# Patient Record
Sex: Male | Born: 1955 | Race: White | Hispanic: Yes | Marital: Married | Smoking: Former smoker
Health system: Southern US, Community
[De-identification: ages and names within clinical notes are randomized; demographics above are authoritative.]

## PROBLEM LIST (undated history)

## (undated) ENCOUNTER — Emergency Department (HOSPITAL_COMMUNITY): Source: Home / Self Care

## (undated) ENCOUNTER — Inpatient Hospital Stay: Payer: Self-pay | Admitting: Internal Medicine

## (undated) DIAGNOSIS — M797 Fibromyalgia: Secondary | ICD-10-CM

## (undated) DIAGNOSIS — I219 Acute myocardial infarction, unspecified: Secondary | ICD-10-CM

## (undated) DIAGNOSIS — E039 Hypothyroidism, unspecified: Secondary | ICD-10-CM

## (undated) DIAGNOSIS — K859 Acute pancreatitis without necrosis or infection, unspecified: Secondary | ICD-10-CM

## (undated) DIAGNOSIS — I10 Essential (primary) hypertension: Secondary | ICD-10-CM

## (undated) DIAGNOSIS — K219 Gastro-esophageal reflux disease without esophagitis: Secondary | ICD-10-CM

## (undated) DIAGNOSIS — M549 Dorsalgia, unspecified: Secondary | ICD-10-CM

## (undated) DIAGNOSIS — E7800 Pure hypercholesterolemia, unspecified: Secondary | ICD-10-CM

## (undated) DIAGNOSIS — J189 Pneumonia, unspecified organism: Secondary | ICD-10-CM

## (undated) DIAGNOSIS — I639 Cerebral infarction, unspecified: Secondary | ICD-10-CM

## (undated) DIAGNOSIS — F1020 Alcohol dependence, uncomplicated: Secondary | ICD-10-CM

## (undated) HISTORY — DX: Alcohol dependence, uncomplicated: F10.20

## (undated) HISTORY — PX: BACK SURGERY: SHX140

## (undated) HISTORY — PX: FINGER SURGERY: SHX640

## (undated) HISTORY — DX: Gastro-esophageal reflux disease without esophagitis: K21.9

## (undated) HISTORY — DX: Acute pancreatitis without necrosis or infection, unspecified: K85.90

## (undated) HISTORY — DX: Pneumonia, unspecified organism: J18.9

## (undated) SURGERY — ESOPHAGOGASTRODUODENOSCOPY (EGD) WITH PROPOFOL
Anesthesia: Monitor Anesthesia Care

---

## 2007-03-14 HISTORY — PX: CHOLECYSTECTOMY: SHX55

## 2008-11-03 ENCOUNTER — Emergency Department (HOSPITAL_COMMUNITY): Admission: EM | Admit: 2008-11-03 | Discharge: 2008-11-03 | Payer: Self-pay | Admitting: Emergency Medicine

## 2008-12-05 ENCOUNTER — Emergency Department (HOSPITAL_COMMUNITY): Admission: EM | Admit: 2008-12-05 | Discharge: 2008-12-05 | Payer: Self-pay | Admitting: Emergency Medicine

## 2010-06-17 LAB — COMPREHENSIVE METABOLIC PANEL
Albumin: 5.4 g/dL — ABNORMAL HIGH (ref 3.5–5.2)
Alkaline Phosphatase: 100 U/L (ref 39–117)
BUN: 10 mg/dL (ref 6–23)
CO2: 30 mEq/L (ref 19–32)
Chloride: 103 mEq/L (ref 96–112)
Creatinine, Ser: 1.24 mg/dL (ref 0.4–1.5)
GFR calc non Af Amer: >60 mL/min (ref >60)
Potassium: 3.5 mEq/L (ref 3.5–5.1)
Total Bilirubin: 0.7 mg/dL (ref 0.3–1.2)

## 2010-06-17 LAB — LIPASE, BLOOD: Lipase: 17 U/L (ref 11–59)

## 2010-06-17 LAB — DIFFERENTIAL
Basophils Absolute: 0 10*3/uL (ref 0.0–0.1)
Basophils Relative: 0 % (ref 0–1)
Eosinophils Relative: 0 % (ref 0–5)
Lymphocytes Relative: 9 % — ABNORMAL LOW (ref 12–46)
Monocytes Absolute: 0.8 10*3/uL (ref 0.1–1.0)
Neutro Abs: 9 10*3/uL — ABNORMAL HIGH (ref 1.7–7.7)

## 2010-06-17 LAB — CBC
HCT: 50.5 % (ref 39.0–52.0)
Hemoglobin: 17.1 g/dL — ABNORMAL HIGH (ref 13.0–17.0)
MCV: 88.5 fL (ref 78.0–100.0)
Platelets: 222 10*3/uL (ref 150–400)
RBC: 5.7 MIL/uL (ref 4.22–5.81)
WBC: 10.7 10*3/uL — ABNORMAL HIGH (ref 4.0–10.5)

## 2010-09-18 ENCOUNTER — Encounter: Payer: Self-pay | Admitting: *Deleted

## 2010-09-18 ENCOUNTER — Emergency Department (HOSPITAL_COMMUNITY)
Admission: EM | Admit: 2010-09-18 | Discharge: 2010-09-18 | Disposition: A | Discharge status: Home / Self Care | Payer: Worker's Compensation | Attending: Emergency Medicine | Admitting: Emergency Medicine

## 2010-09-18 DIAGNOSIS — I10 Essential (primary) hypertension: Secondary | ICD-10-CM | POA: Insufficient documentation

## 2010-09-18 DIAGNOSIS — M545 Low back pain, unspecified: Secondary | ICD-10-CM | POA: Insufficient documentation

## 2010-09-18 DIAGNOSIS — M549 Dorsalgia, unspecified: Secondary | ICD-10-CM | POA: Insufficient documentation

## 2010-09-18 DIAGNOSIS — I252 Old myocardial infarction: Secondary | ICD-10-CM | POA: Insufficient documentation

## 2010-09-18 DIAGNOSIS — I219 Acute myocardial infarction, unspecified: Secondary | ICD-10-CM | POA: Insufficient documentation

## 2010-09-18 DIAGNOSIS — Z8673 Personal history of transient ischemic attack (TIA), and cerebral infarction without residual deficits: Secondary | ICD-10-CM | POA: Insufficient documentation

## 2010-09-18 DIAGNOSIS — IMO0001 Reserved for inherently not codable concepts without codable children: Secondary | ICD-10-CM | POA: Insufficient documentation

## 2010-09-18 DIAGNOSIS — J45909 Unspecified asthma, uncomplicated: Secondary | ICD-10-CM | POA: Insufficient documentation

## 2010-09-18 DIAGNOSIS — I639 Cerebral infarction, unspecified: Secondary | ICD-10-CM | POA: Insufficient documentation

## 2010-09-18 DIAGNOSIS — M797 Fibromyalgia: Secondary | ICD-10-CM | POA: Insufficient documentation

## 2010-09-18 HISTORY — DX: Fibromyalgia: M79.7

## 2010-09-18 HISTORY — DX: Acute myocardial infarction, unspecified: I21.9

## 2010-09-18 HISTORY — DX: Dorsalgia, unspecified: M54.9

## 2010-09-18 HISTORY — DX: Cerebral infarction, unspecified: I63.9

## 2010-09-18 HISTORY — DX: Pure hypercholesterolemia, unspecified: E78.00

## 2010-09-18 HISTORY — DX: Essential (primary) hypertension: I10

## 2010-09-18 MED ORDER — MORPHINE SULFATE 4 MG/ML IJ SOLN
4.0000 mg | Freq: Once | INTRAMUSCULAR | Status: AC
  Administered 2010-09-18: 4 mg via INTRAMUSCULAR
  Filled 2010-09-18: qty 1

## 2010-09-18 MED ORDER — HYDROCODONE-ACETAMINOPHEN 5-325 MG PO TABS: 2.0000 | ORAL_TABLET | ORAL | Status: AC | PRN

## 2010-09-18 MED ORDER — CYCLOBENZAPRINE HCL 10 MG PO TABS: 10.0000 mg | ORAL_TABLET | Freq: Two times a day (BID) | ORAL | Status: AC | PRN

## 2010-09-18 NOTE — ED Provider Notes (Addendum)
History     Chief Complaint  Patient presents with  . Back Pain    lower back pain, unable to walk   Patient is a 55 y.o. male presenting with back pain. The history is provided by the patient and the spouse (HX OF CHRONIC BACK PAIN WITH SEVERAL SURGERIES IN 09. FOLLOWED DR RAMOS AND DR Shelle Iron AND RECENTL REFERRAL TO PAIN MANAGEMENT. EXACERBATION OF CHORNIC BACK PAIN YESTERDAY. ).  Back Pain  This is a chronic problem. The problem occurs constantly. The problem has been rapidly worsening. The pain is associated with no known injury. The pain is present in the lumbar spine. The quality of the pain is described as stabbing and aching. The pain radiates to the right foot. The pain is at a severity of 10/10. The pain is severe. The symptoms are aggravated by bending and certain positions. The pain is the same all the time. Associated symptoms include numbness, leg pain and paresthesias. Pertinent negatives include no chest pain, no fever, no abdominal pain and no bladder incontinence. Treatments tried: HELPED SOME BY VALIUM. HE TOOK A=OLD VALIUM TAB. The treatment provided mild relief.    Past Medical History  Diagnosis Date  . Back pain   . Hypertension   . Asthma   . Thyroid disease   . High cholesterol   . Stroke   . Fibromyalgia   . Myocardial infarct     Past Surgical History  Procedure Date  . Back surgery   . Cholecystectomy     History reviewed. No pertinent family history.  History  Substance Use Topics  . Smoking status: Passive Smoker  . Smokeless tobacco: Not on file  . Alcohol Use: Yes     occasional      Review of Systems  Constitutional: Negative for fever.  HENT: Negative for neck pain.   Eyes: Negative for pain and redness.  Respiratory: Negative for cough and shortness of breath.   Cardiovascular: Negative for chest pain and palpitations.  Gastrointestinal: Negative for vomiting and abdominal pain.  Genitourinary: Negative for bladder incontinence and  difficulty urinating.  Musculoskeletal: Positive for back pain.  Skin: Negative for rash.  Neurological: Positive for numbness and paresthesias.  Hematological: Negative for adenopathy.  Psychiatric/Behavioral: Negative for confusion.    Physical Exam  BP 97/48  Pulse 72  Temp(Src) 98.3 F (36.8 C) (Oral)  Resp 18  Ht 5\' 7"  (1.702 m)  Wt 165 lb (74.844 kg)  BMI 25.84 kg/m2  SpO2 99%  Physical Exam  Constitutional: He is oriented to person, place, and time. He appears well-developed and well-nourished.  HENT:  Head: Normocephalic and atraumatic.  Eyes: EOM are normal. Pupils are equal, round, and reactive to light.  Neck: Normal range of motion.  Cardiovascular: Normal rate, regular rhythm and normal heart sounds.   Pulmonary/Chest: Breath sounds normal.  Abdominal: Soft. Bowel sounds are normal. There is no tenderness.  Musculoskeletal: He exhibits tenderness.       TENDER BILAT LOW BACK NO SPASM. GOOD DISTAL PULSES. NUMBNESS RIGHT FOOT.   Neurological: He is alert and oriented to person, place, and time. No cranial nerve deficit.  Skin: Skin is warm and dry. No rash noted.  Psychiatric: He has a normal mood and affect.    ED Course  Procedures  MDM IMPROVED WITH MORPHINE IV IN ED. FEELING BETTER.       Shelda Jakes, MD 09/18/10 1513  Shelda Jakes, MD 09/18/10 1515

## 2010-09-18 NOTE — ED Notes (Signed)
Lower back pain radiating down both legs x 2 days.  States is unable to walk.  Denies injury.

## 2010-09-18 NOTE — ED Notes (Signed)
Family with pt. Pt resting with eyes closed now

## 2010-09-22 ENCOUNTER — Encounter (HOSPITAL_COMMUNITY): Payer: Self-pay

## 2012-03-13 HISTORY — PX: PANCREATIC PSEUDOCYST DRAINAGE: SHX2158

## 2012-05-30 ENCOUNTER — Inpatient Hospital Stay (HOSPITAL_COMMUNITY)
Admission: EM | Admit: 2012-05-30 | Discharge: 2012-06-14 | DRG: 440 | Disposition: A | Discharge status: Skilled Nursing Facility | Payer: Medicare Other | Attending: Internal Medicine | Admitting: Internal Medicine

## 2012-05-30 ENCOUNTER — Encounter (HOSPITAL_COMMUNITY): Payer: Self-pay | Admitting: *Deleted

## 2012-05-30 ENCOUNTER — Emergency Department (HOSPITAL_COMMUNITY): Payer: Medicare Other

## 2012-05-30 DIAGNOSIS — F329 Major depressive disorder, single episode, unspecified: Secondary | ICD-10-CM | POA: Diagnosis present

## 2012-05-30 DIAGNOSIS — K8689 Other specified diseases of pancreas: Secondary | ICD-10-CM | POA: Diagnosis present

## 2012-05-30 DIAGNOSIS — I10 Essential (primary) hypertension: Secondary | ICD-10-CM | POA: Diagnosis present

## 2012-05-30 DIAGNOSIS — Z8673 Personal history of transient ischemic attack (TIA), and cerebral infarction without residual deficits: Secondary | ICD-10-CM

## 2012-05-30 DIAGNOSIS — E039 Hypothyroidism, unspecified: Secondary | ICD-10-CM | POA: Diagnosis present

## 2012-05-30 DIAGNOSIS — K859 Acute pancreatitis without necrosis or infection, unspecified: Principal | ICD-10-CM | POA: Diagnosis present

## 2012-05-30 DIAGNOSIS — M549 Dorsalgia, unspecified: Secondary | ICD-10-CM

## 2012-05-30 DIAGNOSIS — R109 Unspecified abdominal pain: Secondary | ICD-10-CM

## 2012-05-30 DIAGNOSIS — N200 Calculus of kidney: Secondary | ICD-10-CM | POA: Diagnosis present

## 2012-05-30 DIAGNOSIS — I2510 Atherosclerotic heart disease of native coronary artery without angina pectoris: Secondary | ICD-10-CM | POA: Diagnosis present

## 2012-05-30 DIAGNOSIS — K5900 Constipation, unspecified: Secondary | ICD-10-CM | POA: Diagnosis present

## 2012-05-30 DIAGNOSIS — IMO0001 Reserved for inherently not codable concepts without codable children: Secondary | ICD-10-CM | POA: Diagnosis present

## 2012-05-30 DIAGNOSIS — J45909 Unspecified asthma, uncomplicated: Secondary | ICD-10-CM | POA: Diagnosis present

## 2012-05-30 DIAGNOSIS — K831 Obstruction of bile duct: Secondary | ICD-10-CM | POA: Diagnosis present

## 2012-05-30 DIAGNOSIS — F3289 Other specified depressive episodes: Secondary | ICD-10-CM | POA: Diagnosis present

## 2012-05-30 DIAGNOSIS — K863 Pseudocyst of pancreas: Secondary | ICD-10-CM

## 2012-05-30 DIAGNOSIS — Z951 Presence of aortocoronary bypass graft: Secondary | ICD-10-CM

## 2012-05-30 DIAGNOSIS — F1210 Cannabis abuse, uncomplicated: Secondary | ICD-10-CM | POA: Diagnosis present

## 2012-05-30 DIAGNOSIS — I25810 Atherosclerosis of coronary artery bypass graft(s) without angina pectoris: Secondary | ICD-10-CM

## 2012-05-30 DIAGNOSIS — K449 Diaphragmatic hernia without obstruction or gangrene: Secondary | ICD-10-CM | POA: Diagnosis present

## 2012-05-30 DIAGNOSIS — R000 Tachycardia, unspecified: Secondary | ICD-10-CM | POA: Diagnosis present

## 2012-05-30 DIAGNOSIS — R945 Abnormal results of liver function studies: Secondary | ICD-10-CM | POA: Diagnosis present

## 2012-05-30 DIAGNOSIS — E785 Hyperlipidemia, unspecified: Secondary | ICD-10-CM | POA: Diagnosis present

## 2012-05-30 DIAGNOSIS — M797 Fibromyalgia: Secondary | ICD-10-CM

## 2012-05-30 DIAGNOSIS — E782 Mixed hyperlipidemia: Secondary | ICD-10-CM | POA: Diagnosis present

## 2012-05-30 DIAGNOSIS — I252 Old myocardial infarction: Secondary | ICD-10-CM

## 2012-05-30 DIAGNOSIS — D72829 Elevated white blood cell count, unspecified: Secondary | ICD-10-CM | POA: Diagnosis present

## 2012-05-30 DIAGNOSIS — E876 Hypokalemia: Secondary | ICD-10-CM | POA: Diagnosis present

## 2012-05-30 LAB — CBC WITH DIFFERENTIAL/PLATELET
Basophils Absolute: 0 10*3/uL (ref 0.0–0.1)
Basophils Relative: 0 % (ref 0–1)
Eosinophils Absolute: 0.1 10*3/uL (ref 0.0–0.7)
MCH: 29.5 pg (ref 26.0–34.0)
MCHC: 34.2 g/dL (ref 30.0–36.0)
Neutro Abs: 17.4 10*3/uL — ABNORMAL HIGH (ref 1.7–7.7)
Neutrophils Relative %: 81 % — ABNORMAL HIGH (ref 43–77)
Platelets: 246 10*3/uL (ref 150–400)

## 2012-05-30 LAB — PROTIME-INR
INR: 0.94 (ref 0.00–1.49)
Prothrombin Time: 12.5 seconds (ref 11.6–15.2)

## 2012-05-30 LAB — COMPREHENSIVE METABOLIC PANEL
ALT: 287 U/L — ABNORMAL HIGH (ref 0–53)
AST: 191 U/L — ABNORMAL HIGH (ref 0–37)
Albumin: 4.1 g/dL (ref 3.5–5.2)
Alkaline Phosphatase: 529 U/L — ABNORMAL HIGH (ref 39–117)
Chloride: 103 mEq/L (ref 96–112)
Potassium: 3 mEq/L — ABNORMAL LOW (ref 3.5–5.1)
Sodium: 141 mEq/L (ref 135–145)
Total Bilirubin: 1.6 mg/dL — ABNORMAL HIGH (ref 0.3–1.2)
Total Protein: 7.6 g/dL (ref 6.0–8.3)

## 2012-05-30 LAB — LIPASE, BLOOD: Lipase: >3000 U/L — ABNORMAL HIGH (ref 11–59)

## 2012-05-30 LAB — URINALYSIS, ROUTINE W REFLEX MICROSCOPIC
Bilirubin Urine: NEGATIVE
Glucose, UA: NEGATIVE mg/dL
Hgb urine dipstick: NEGATIVE
Nitrite: NEGATIVE
Specific Gravity, Urine: 1.015 (ref 1.005–1.030)
pH: 6 (ref 5.0–8.0)

## 2012-05-30 LAB — RAPID URINE DRUG SCREEN, HOSP PERFORMED
Amphetamines: NOT DETECTED
Barbiturates: NOT DETECTED
Benzodiazepines: NOT DETECTED
Cocaine: NOT DETECTED
Tetrahydrocannabinol: POSITIVE — AB

## 2012-05-30 LAB — APTT: aPTT: 23 seconds — ABNORMAL LOW (ref 24–37)

## 2012-05-30 MED ORDER — LEVOTHYROXINE SODIUM 100 MCG IV SOLR
50.0000 ug | Freq: Every day | INTRAVENOUS | Status: DC
  Administered 2012-05-31 – 2012-06-03 (×4): 50 ug via INTRAVENOUS
  Administered 2012-06-04 – 2012-06-05 (×2): via INTRAVENOUS
  Administered 2012-06-06 – 2012-06-07 (×2): 50 ug via INTRAVENOUS
  Filled 2012-05-30 (×12): qty 5

## 2012-05-30 MED ORDER — SODIUM CHLORIDE 0.9 % IV BOLUS (SEPSIS): 2000.0000 mL | Freq: Once | INTRAVENOUS | Status: DC

## 2012-05-30 MED ORDER — DIPHENHYDRAMINE HCL 50 MG/ML IJ SOLN: 12.5000 mg | Freq: Four times a day (QID) | INTRAMUSCULAR | Status: DC | PRN

## 2012-05-30 MED ORDER — SODIUM CHLORIDE 0.9 % IV BOLUS (SEPSIS)
2000.0000 mL | Freq: Once | INTRAVENOUS | Status: AC
  Administered 2012-05-30: 2000 mL via INTRAVENOUS

## 2012-05-30 MED ORDER — POTASSIUM CHLORIDE IN NACL 20-0.9 MEQ/L-% IV SOLN
INTRAVENOUS | Status: DC
  Administered 2012-05-31 (×2): via INTRAVENOUS

## 2012-05-30 MED ORDER — ONDANSETRON HCL 4 MG/2ML IJ SOLN
4.0000 mg | INTRAMUSCULAR | Status: DC | PRN
  Administered 2012-06-01 – 2012-06-11 (×2): 4 mg via INTRAVENOUS
  Filled 2012-05-30 (×2): qty 2

## 2012-05-30 MED ORDER — SODIUM CHLORIDE 0.9 % IV SOLN
INTRAVENOUS | Status: AC
  Administered 2012-05-30: 23:00:00 via INTRAVENOUS

## 2012-05-30 MED ORDER — ENOXAPARIN SODIUM 40 MG/0.4ML ~~LOC~~ SOLN
40.0000 mg | SUBCUTANEOUS | Status: DC
  Administered 2012-05-31 – 2012-06-14 (×15): 40 mg via SUBCUTANEOUS
  Filled 2012-05-30 (×19): qty 0.4

## 2012-05-30 MED ORDER — ONDANSETRON HCL 4 MG/2ML IJ SOLN
4.0000 mg | INTRAMUSCULAR | Status: DC | PRN
  Administered 2012-05-30: 4 mg via INTRAVENOUS
  Filled 2012-05-30: qty 2

## 2012-05-30 MED ORDER — FLEET ENEMA 7-19 GM/118ML RE ENEM: 1.0000 | ENEMA | Freq: Once | RECTAL | Status: AC | PRN

## 2012-05-30 MED ORDER — FENTANYL CITRATE 0.05 MG/ML IJ SOLN
100.0000 ug | INTRAMUSCULAR | Status: DC | PRN
  Administered 2012-05-30: 100 ug via INTRAVENOUS
  Filled 2012-05-30: qty 2

## 2012-05-30 MED ORDER — PROMETHAZINE HCL 25 MG/ML IJ SOLN
25.0000 mg | Freq: Once | INTRAMUSCULAR | Status: AC
  Administered 2012-05-30: 25 mg via INTRAVENOUS
  Filled 2012-05-30: qty 1

## 2012-05-30 MED ORDER — SODIUM CHLORIDE 0.9 % IJ SOLN
3.0000 mL | Freq: Two times a day (BID) | INTRAMUSCULAR | Status: DC
  Administered 2012-05-31 – 2012-06-13 (×22): 3 mL via INTRAVENOUS
  Administered 2012-06-14: 10:00:00 via INTRAVENOUS

## 2012-05-30 MED ORDER — POTASSIUM CHLORIDE 10 MEQ/100ML IV SOLN
10.0000 meq | INTRAVENOUS | Status: AC
  Administered 2012-05-30 – 2012-05-31 (×4): 10 meq via INTRAVENOUS
  Filled 2012-05-30 (×3): qty 100

## 2012-05-30 MED ORDER — PANTOPRAZOLE SODIUM 40 MG IV SOLR
80.0000 mg | Freq: Two times a day (BID) | INTRAVENOUS | Status: DC
  Administered 2012-05-31 – 2012-06-02 (×7): 80 mg via INTRAVENOUS
  Filled 2012-05-30 (×12): qty 80

## 2012-05-30 MED ORDER — MORPHINE SULFATE 10 MG/ML IJ SOLN
10.0000 mg | Freq: Once | INTRAMUSCULAR | Status: AC
  Administered 2012-05-30: 10 mg via INTRAVENOUS
  Filled 2012-05-30: qty 1

## 2012-05-30 MED ORDER — ONDANSETRON HCL 4 MG/2ML IJ SOLN
4.0000 mg | Freq: Once | INTRAMUSCULAR | Status: AC
  Administered 2012-05-30: 4 mg via INTRAVENOUS
  Filled 2012-05-30: qty 2

## 2012-05-30 MED ORDER — NALOXONE HCL 0.4 MG/ML IJ SOLN: 0.4000 mg | INTRAMUSCULAR | Status: DC | PRN

## 2012-05-30 MED ORDER — FENTANYL CITRATE 0.05 MG/ML IJ SOLN
INTRAMUSCULAR | Status: AC
  Filled 2012-05-30: qty 2

## 2012-05-30 MED ORDER — SODIUM CHLORIDE 0.9 % IV SOLN
1000.0000 mL | INTRAVENOUS | Status: DC
  Administered 2012-05-30: 1000 mL via INTRAVENOUS

## 2012-05-30 MED ORDER — SODIUM CHLORIDE 0.9 % IJ SOLN: 9.0000 mL | INTRAMUSCULAR | Status: DC | PRN

## 2012-05-30 MED ORDER — BISACODYL 10 MG RE SUPP: 10.0000 mg | Freq: Every day | RECTAL | Status: DC | PRN

## 2012-05-30 MED ORDER — FENTANYL CITRATE 0.05 MG/ML IJ SOLN
100.0000 ug | Freq: Once | INTRAMUSCULAR | Status: AC
  Administered 2012-05-30: 100 ug via INTRAVENOUS
  Filled 2012-05-30: qty 2

## 2012-05-30 MED ORDER — FENTANYL 10 MCG/ML IV SOLN
INTRAVENOUS | Status: DC
  Administered 2012-05-31: 14:00:00 via INTRAVENOUS
  Administered 2012-05-31: 465 ug/h via INTRAVENOUS
  Administered 2012-05-31: 272.9 ug/h via INTRAVENOUS
  Administered 2012-05-31: via INTRAVENOUS
  Filled 2012-05-30 (×4): qty 50

## 2012-05-30 NOTE — H&P (Signed)
[facility] History and Physical  Justin [name]  RUE:[id number]  DOB: 02-Feb-1956   DOA: 05/30/2012   PCP:   No primary provider on file.   Chief Complaint:  Nausea and abdominal pain since last night  HPI: Justin [name] is an 57 y.o. male.   Middle-aged gentleman, past history of cholecystectomy, denies a history of alcohol use, presents with severe abdominal pain and vomiting since last night. Patient's wife has a habit of bringing him a milkshake every night, he was nauseous last night and after the milkshake got much worse. Since this morning has been vomiting unable to keep anything down. No blood in the vomit no fever no diarrhea, eventually because the pain was intolerable he came to the emergency room, where blood work revealed a markedly elevated lipase and arrange liver function. The hospitalist service was called to assist.  Patient has required large amounts of narcotics for pain control, and because of a combination of ongoing severe pain and drowsiness from narcotics, and most of the history is filled out by his wife.  He has chronic back pains due to disc problems, for which she sees Dr. August Saucer and Dr. Ethelene Hal, and is reported scheduled for surgery in the near future.  She takes Cymbalta and Vicodin as a part of her chronic pain program he did to his back pains and fibromyalgia  Rewiew of Systems:   All systems negative except as marked bold or noted in the HPI;  Constitutional:    malaise, fever and chills. ;  Eyes:   eye pain, redness and discharge. ;  ENMT:   ear pain, hoarseness, nasal congestion, sinus pressure and sore throat. ;  Cardiovascular:    chest pain, palpitations, diaphoresis, dyspnea and peripheral edema.  Respiratory:   cough, hemoptysis, wheezing and stridor. ;  Gastrointestinal:  nausea, vomiting, diarrhea, constipation, abdominal pain, melena, blood in stool, hematemesis, jaundice and rectal bleeding. unusual weight loss..   Genitourinary:     frequency, dysuria, incontinence,flank pain and hematuria; Musculoskeletal:   back pain and neck pain.  swelling and trauma.;  Skin: .  pruritus, rash, abrasions, bruising and skin lesion.; ulcerations Neuro:    headache, lightheadedness and neck stiffness.  weakness, altered level of consciousness, altered mental status, extremity weakness, burning feet, involuntary movement, seizure and syncope.  Psych:    anxiety, depression, insomnia, tearfulness, panic attacks, hallucinations, paranoia, suicidal or homicidal ideation    Past Medical History  Diagnosis Date  . Back pain   . Hypertension   . Asthma   . Thyroid disease   . High cholesterol   . Stroke   . Fibromyalgia   . Myocardial infarct     Past Surgical History  Procedure Laterality Date  . Back surgery    . Cholecystectomy      Medications:  HOME MEDS: Prior to Admission medications   Medication Sig Start Date End Date Taking? Authorizing Provider  aspirin 325 MG tablet Take 325 mg by mouth daily.     Yes Historical Provider, MD  DULoxetine (CYMBALTA) 60 MG capsule Take 60 mg by mouth daily.     Yes Historical Provider, MD  esomeprazole (NEXIUM) 40 MG capsule Take 40 mg by mouth daily before breakfast.   Yes Historical Provider, MD  gabapentin (NEURONTIN) 300 MG capsule Take 300 mg by mouth 3 (three) times daily.     Yes Historical Provider, MD  HYDROcodone-acetaminophen (NORCO/VICODIN) 5-325 MG per tablet Take 1 tablet by mouth 2 (two) times daily as  needed for pain.   Yes Historical Provider, MD  levothyroxine (SYNTHROID, LEVOTHROID) 100 MCG tablet Take 100 mcg by mouth daily.     Yes Historical Provider, MD  simvastatin (ZOCOR) 80 MG tablet Take 80 mg by mouth at bedtime.     Yes Historical Provider, MD  nitroGLYCERIN (NITROSTAT) 0.4 MG SL tablet Place 0.4 mg under the tongue every 5 (five) minutes as needed for chest pain.     Historical Provider, MD     Allergies:  Allergies  Allergen Reactions  . Amoxicillin    . Dilaudid (Hydromorphone Hcl) Swelling    Social History:   reports that he has been passively smoking.  He does not have any smokeless tobacco history on file. He reports that  drinks alcohol. He reports that he does not use illicit drugs.  Family History: Family History  Problem Relation Age of Onset  . Diabetes Mother   . Diabetes Brother   . Diabetes Brother   . Cancer Father      Physical Exam: Filed Vitals:   05/30/12 1935 05/30/12 2142 05/30/12 2230 05/30/12 2321  BP: 152/88 157/90 148/79 175/92  Pulse: 88 81 99 112  Temp:    97.6 F (36.4 C)  TempSrc:    Oral  Resp:  24  22  Height:    5\' 7"  (1.702 m)  Weight:    76.6 kg (168 lb 14 oz)  SpO2: 98% 97% 98% 98%   Blood pressure 175/92, pulse 112, temperature 97.6 F (36.4 C), temperature source Oral, resp. rate 22, height 5\' 7"  (1.702 m), weight 76.6 kg (168 lb 14 oz), SpO2 98.00%.  GEN:  Ill-looking and distressed Hispanic gentleman lying bed stress and groaning in pain; cooperative with exam PSYCH:  alert and oriented x4;  drowsy but anxious; affect is appropriate. HEENT: Mucous membranes pink and anicteric; PERRLA; EOM intact; no cervical lymphadenopathy nor thyromegaly or carotid bruit; no JVD; Breasts:: Not examined CHEST WALL: No tenderness CHEST: Tachypneic, clear to auscultation bilaterally HEART: Regular rate and rhythm; no murmurs rubs or gallops BACK: No kyphosis no scoliosis; no CVA tenderness ABDOMEN: Obese, distended diffuse mild tenderness no rebound, no organomegaly, absent  abdominal bowel sounds; no pannus; no intertriginous candida. Rectal Exam: Not done EXTREMITIES: No bone or joint deformity; age-appropriate arthropathy of the hands and knees; no edema; no ulcerations. Genitalia: not examined PULSES: 2+ and symmetric SKIN: Normal hydration no rash or ulceration CNS: Cranial nerves 2-12 grossly intact no focal lateralizing neurologic deficit   Labs on Admission:  Basic Metabolic  Panel:  Recent Labs Lab 05/30/12 1855  NA 141  K 3.0*  CL 103  CO2 26  GLUCOSE 184*  BUN 11  CREATININE 1.03  CALCIUM 9.4   Liver Function Tests:  Recent Labs Lab 05/30/12 1855  AST 191*  ALT 287*  ALKPHOS 529*  BILITOT 1.6*  PROT 7.6  ALBUMIN 4.1    Recent Labs Lab 05/30/12 1855  LIPASE >3000*   No results found for this basename: AMMONIA,  in the last 168 hours CBC:  Recent Labs Lab 05/30/12 1855  WBC 21.6*  NEUTROABS 17.4*  HGB 14.8  HCT 43.3  MCV 86.3  PLT 246   Cardiac Enzymes: No results found for this basename: CKTOTAL, CKMB, CKMBINDEX, TROPONINI,  in the last 168 hours BNP: No components found with this basename: POCBNP,  D-dimer: No components found with this basename: D-DIMER,  CBG: No results found for this basename: GLUCAP,  in the last 168 hours  Radiological Exams on Admission: Dg Abd Acute W/chest  05/30/2012  *RADIOLOGY REPORT*  Clinical Data: Abdominal pain and vomiting.  ACUTE ABDOMEN SERIES (ABDOMEN 2 VIEW & CHEST 1 VIEW)  Comparison: 12/05/2008  Findings: Lungs are clear. Heart and mediastinum are within normal limits.  Trachea is midline.  No evidence of free air.  Nonspecific bowel gas pattern.  Surgical clips in the right upper abdomen. Degenerative changes in the lower lumbar spine.  Phleboliths in the pelvis.  IMPRESSION: No acute findings.   Original Report Authenticated By: Richarda Overlie, M.D.      Assessment/Plan Present on Admission:  . Acute pancreatitis . Hypokalemia . Abnormal LFTs (liver function tests) . Back pain . Fibromyalgia . High cholesterol . CAD (coronary artery disease) of artery bypass graft   PLAN: We'll admit this gentleman to telemetry for IV fluid hydration and repletion of his potassium. He is allergic to Dilaudid, and has high narcotic requirements; we will put him on fentanyl PCA pump; and check a urine drug screen He'll be completely n.p.o. Elevated liver function tests suggests biliary stones;  will get a CT scan of his abdomen since he has no gallbladder, and will consult the GI service for assistance with management. We'll check lipid panel look for other causes of his acute pancreatitis. May need to move to step down intensive care if he continues to deteriorate  Other plans as per orders.  Code Status: FULL CODE  Family Communication: Discussed plans with his wife Disposition Plan: Depending on response total therapy over the next few days  Critical care time: 60 minutes.   Justin [name] Nocturnist [facility] Pager [phone]   05/30/2012, 11:53 PM

## 2012-05-30 NOTE — ED Provider Notes (Addendum)
History     CSN: [id number]  Arrival date & time 05/30/12  1819   First MD Initiated Contact with Patient 05/30/12 1836      Chief Complaint  Patient presents with  . Abdominal Pain    (Consider location/radiation/quality/duration/timing/severity/associated sxs/prior treatment) HPI Comments: This is a 57 year old man with severe abdominal pain. He developed nausea this morning and had vomiting of yellow fluid it looks like ground meat in it. There was no diarrhea. His wife says he ate something yesterday that his mother at [place], which could have been spoiled. He has a prior history of cholecystectomy and also having had back surgery. He is allergic to Dilaudid. Also allergic to amoxicillin. In addition, he has had a prior stroke.  Patient is a 57 y.o. male presenting with abdominal pain. The history is provided by the patient, the spouse and medical records. No language interpreter was used.  Abdominal Pain Pain location:  Epigastric Pain quality comment:  Severe Pain radiates to:  Does not radiate Pain severity:  Severe Onset quality:  Sudden Timing:  Constant Progression:  Worsening Chronicity:  New Diet changes: May have come from eating bad food yesterday.   Relieved by:  Nothing Worsened by:  Nothing tried Ineffective treatments:  None tried Associated symptoms: nausea and vomiting   Associated symptoms: no diarrhea and no fever     Past Medical History  Diagnosis Date  . Back pain   . Hypertension   . Asthma   . Thyroid disease   . High cholesterol   . Stroke   . Fibromyalgia   . Myocardial infarct     Past Surgical History  Procedure Laterality Date  . Back surgery    . Cholecystectomy      History reviewed. No pertinent family history.  History  Substance Use Topics  . Smoking status: Passive Smoke Exposure - Never Smoker  . Smokeless tobacco: Not on file  . Alcohol Use: Yes     Comment: occasional      Review of Systems  Constitutional:  Negative for fever.  HENT: Negative.   Eyes: Negative.   Respiratory: Negative.   Cardiovascular: Negative.   Gastrointestinal: Positive for nausea, vomiting and abdominal pain. Negative for diarrhea.  Genitourinary: Negative.   Musculoskeletal: Negative.   Skin: Negative.   Neurological: Negative.   Psychiatric/Behavioral: Negative.     Allergies  Amoxicillin and Dilaudid  Home Medications   Current Outpatient Rx  Name  Route  Sig  Dispense  Refill  . aspirin 325 MG tablet   Oral   Take 325 mg by mouth daily.           . DULoxetine (CYMBALTA) 60 MG capsule   Oral   Take 60 mg by mouth daily.           Marland Kitchen gabapentin (NEURONTIN) 300 MG capsule   Oral   Take 300 mg by mouth 3 (three) times daily.           Marland Kitchen levothyroxine (SYNTHROID, LEVOTHROID) 100 MCG tablet   Oral   Take 100 mcg by mouth daily.           Marland Kitchen lisinopril (PRINIVIL,ZESTRIL) 5 MG tablet   Oral   Take 5 mg by mouth daily.           . nitroGLYCERIN (NITROSTAT) 0.4 MG SL tablet   Sublingual   Place 0.4 mg under the tongue every 5 (five) minutes as needed.           Marland Kitchen  ranitidine (ZANTAC) 300 MG tablet   Oral   Take 300 mg by mouth daily.           . simvastatin (ZOCOR) 80 MG tablet   Oral   Take 80 mg by mouth at bedtime.             BP 144/93  Pulse 75  Temp(Src) 97.8 F (36.6 C) (Oral)  Resp 21  Ht 5\' 7"  (1.702 m)  Wt 165 lb (74.844 kg)  BMI 25.84 kg/m2  SpO2 100%  Physical Exam  Nursing note and vitals reviewed. Constitutional: He is oriented to person, place, and time. He appears well-developed and well-nourished. Distressed: in acute distress with epigastric pain.  HENT:  Head: Normocephalic and atraumatic.  Right Ear: External ear normal.  Left Ear: External ear normal.  Mouth/Throat: Oropharynx is clear and moist.  Eyes: Conjunctivae and EOM are normal. Pupils are equal, round, and reactive to light. No scleral icterus.  Neck: Normal range of motion. Neck supple.   Cardiovascular: Normal rate, regular rhythm and normal heart sounds.   Pulmonary/Chest: Effort normal and breath sounds normal.  Abdominal: Soft. He exhibits no mass. Tenderness: Epigastric tenderness, with some distention. There is no rebound and no guarding.  Musculoskeletal: Normal range of motion. He exhibits no edema and no tenderness.  Lymphadenopathy:    He has no cervical adenopathy.  Neurological: He is alert and oriented to person, place, and time.  No sensory or motor deficit  Skin: Skin is warm and dry.  Psychiatric: He has a normal mood and affect. His behavior is normal.    ED Course  Procedures (including critical care time)  Labs Reviewed  CBC WITH DIFFERENTIAL  COMPREHENSIVE METABOLIC PANEL  LIPASE, BLOOD  URINALYSIS, ROUTINE W REFLEX MICROSCOPIC  PREGNANCY, URINE   6:58 PM Pt seen --> physical exam performed.  Lab workup and acute abdominal x-ray series ordered.  IV Fentanyl and Zofran ordered.  8:24 PM Results for orders placed during the hospital encounter of 05/30/12  CBC WITH DIFFERENTIAL      Result Value Range   WBC 21.6 (*) 4.0 - 10.5 K/uL   RBC 5.02  4.22 - 5.81 MIL/uL   Hemoglobin 14.8  13.0 - 17.0 g/dL   HCT 40.9  81.1 - 91.4 %   MCV 86.3  78.0 - 100.0 fL   MCH 29.5  26.0 - 34.0 pg   MCHC 34.2  30.0 - 36.0 g/dL   RDW 78.2  95.6 - 21.3 %   Platelets 246  150 - 400 K/uL   Neutrophils Relative 81 (*) 43 - 77 %   Neutro Abs 17.4 (*) 1.7 - 7.7 K/uL   Lymphocytes Relative 13  12 - 46 %   Lymphs Abs 2.9  0.7 - 4.0 K/uL   Monocytes Relative 6  3 - 12 %   Monocytes Absolute 1.2 (*) 0.1 - 1.0 K/uL   Eosinophils Relative 0  0 - 5 %   Eosinophils Absolute 0.1  0.0 - 0.7 K/uL   Basophils Relative 0  0 - 1 %   Basophils Absolute 0.0  0.0 - 0.1 K/uL  COMPREHENSIVE METABOLIC PANEL      Result Value Range   Sodium 141  135 - 145 mEq/L   Potassium 3.0 (*) 3.5 - 5.1 mEq/L   Chloride 103  96 - 112 mEq/L   CO2 26  19 - 32 mEq/L   Glucose, Bld 184 (*) 70  - 99 mg/dL   BUN  11  6 - 23 mg/dL   Creatinine, Ser 1.61  0.50 - 1.35 mg/dL   Calcium 9.4  8.4 - 09.6 mg/dL   Total Protein 7.6  6.0 - 8.3 g/dL   Albumin 4.1  3.5 - 5.2 g/dL   AST 045 (*) 0 - 37 U/L   ALT 287 (*) 0 - 53 U/L   Alkaline Phosphatase 529 (*) 39 - 117 U/L   Total Bilirubin 1.6 (*) 0.3 - 1.2 mg/dL   GFR calc non Af Amer 79 (*) >90 mL/min   GFR calc Af Amer >90  >90 mL/min  LIPASE, BLOOD      Result Value Range   Lipase >3000 (*) 11 - 59 U/L    LFT's abnormal, Lipase > 3000.  Dx acute pancreatitis.  Will order abdominal CT, request admission.  8:44 PM Case discussed with Dr. Vania Rea, who advised that pt should be admitted and have ultrasound of the abdomen in the AM instead of CT.  Admit to Team 1 to a telemetry bed.   1. Acute pancreatitis        Carleene Cooper III, MD 05/30/12 2045    Carleene Cooper III, MD 05/30/12 [phone]

## 2012-05-30 NOTE — ED Notes (Addendum)
abd pain and vomiting, onset today.no diarrhea.  Moaning with pain.

## 2012-05-31 ENCOUNTER — Inpatient Hospital Stay (HOSPITAL_COMMUNITY): Payer: Medicare Other

## 2012-05-31 DIAGNOSIS — I25810 Atherosclerosis of coronary artery bypass graft(s) without angina pectoris: Secondary | ICD-10-CM

## 2012-05-31 DIAGNOSIS — K859 Acute pancreatitis without necrosis or infection, unspecified: Principal | ICD-10-CM

## 2012-05-31 DIAGNOSIS — R7989 Other specified abnormal findings of blood chemistry: Secondary | ICD-10-CM

## 2012-05-31 DIAGNOSIS — IMO0001 Reserved for inherently not codable concepts without codable children: Secondary | ICD-10-CM

## 2012-05-31 LAB — CBC
HCT: 50.2 % (ref 39.0–52.0)
MCHC: 34.9 g/dL (ref 30.0–36.0)
MCV: 85.4 fL (ref 78.0–100.0)
Platelets: 230 10*3/uL (ref 150–400)
RDW: 14.3 % (ref 11.5–15.5)
WBC: 17.9 10*3/uL — ABNORMAL HIGH (ref 4.0–10.5)

## 2012-05-31 LAB — COMPREHENSIVE METABOLIC PANEL
AST: 318 U/L — ABNORMAL HIGH (ref 0–37)
Albumin: 4.3 g/dL (ref 3.5–5.2)
BUN: 7 mg/dL (ref 6–23)
Calcium: 9.1 mg/dL (ref 8.4–10.5)
Creatinine, Ser: 0.71 mg/dL (ref 0.50–1.35)
Total Protein: 8.1 g/dL (ref 6.0–8.3)

## 2012-05-31 LAB — BLOOD GAS, ARTERIAL
Acid-base deficit: 2.4 mmol/L — ABNORMAL HIGH (ref 0.0–2.0)
O2 Content: 2 L/min
O2 Saturation: 97.2 %
Patient temperature: 37
TCO2: 18.6 mmol/L (ref 0–100)
pCO2 arterial: 40.6 mmHg (ref 35.0–45.0)

## 2012-05-31 LAB — HEMOGLOBIN A1C
Hgb A1c MFr Bld: 6.2 % — ABNORMAL HIGH (ref <5.7)
Mean Plasma Glucose: 131 mg/dL — ABNORMAL HIGH (ref <117)

## 2012-05-31 LAB — LIPID PANEL: Total CHOL/HDL Ratio: 3.3 RATIO

## 2012-05-31 MED ORDER — FENTANYL CITRATE 0.05 MG/ML IJ SOLN
50.0000 ug | INTRAMUSCULAR | Status: DC | PRN
  Administered 2012-05-31 – 2012-06-01 (×8): 50 ug via INTRAVENOUS
  Filled 2012-05-31 (×9): qty 2

## 2012-05-31 MED ORDER — IOHEXOL 300 MG/ML  SOLN
50.0000 mL | Freq: Once | INTRAMUSCULAR | Status: AC | PRN
  Administered 2012-05-31: 50 mL via ORAL

## 2012-05-31 MED ORDER — HYDRALAZINE HCL 20 MG/ML IJ SOLN
10.0000 mg | INTRAMUSCULAR | Status: DC | PRN
  Administered 2012-06-02: 10 mg via INTRAVENOUS
  Filled 2012-05-31: qty 1

## 2012-05-31 MED ORDER — IOHEXOL 300 MG/ML  SOLN: 100.0000 mL | Freq: Once | INTRAMUSCULAR | Status: AC | PRN

## 2012-05-31 MED ORDER — METRONIDAZOLE IN NACL 5-0.79 MG/ML-% IV SOLN
500.0000 mg | Freq: Three times a day (TID) | INTRAVENOUS | Status: DC
  Administered 2012-05-31 – 2012-06-10 (×31): 500 mg via INTRAVENOUS
  Filled 2012-05-31 (×39): qty 100

## 2012-05-31 MED ORDER — FENTANYL BOLUS VIA INFUSION
100.0000 ug | Freq: Once | INTRAVENOUS | Status: AC
  Administered 2012-05-31: 100 ug via INTRAVENOUS

## 2012-05-31 MED ORDER — IOHEXOL 300 MG/ML  SOLN
100.0000 mL | Freq: Once | INTRAMUSCULAR | Status: AC | PRN
  Administered 2012-05-31: 100 mL via INTRAVENOUS

## 2012-05-31 MED ORDER — PANTOPRAZOLE SODIUM 40 MG IV SOLR
INTRAVENOUS | Status: AC
  Filled 2012-05-31: qty 80

## 2012-05-31 MED ORDER — POTASSIUM CHLORIDE IN NACL 20-0.9 MEQ/L-% IV SOLN
INTRAVENOUS | Status: DC
  Administered 2012-05-31 – 2012-06-02 (×7): via INTRAVENOUS
  Administered 2012-06-02: 225 mL/h via INTRAVENOUS
  Administered 2012-06-03 – 2012-06-04 (×5): via INTRAVENOUS
  Administered 2012-06-04: 1000 mL via INTRAVENOUS
  Administered 2012-06-05 (×2): via INTRAVENOUS
  Administered 2012-06-05: 1000 mL via INTRAVENOUS
  Administered 2012-06-06 – 2012-06-07 (×3): via INTRAVENOUS
  Filled 2012-05-31 (×35): qty 1000

## 2012-05-31 MED ORDER — CIPROFLOXACIN IN D5W 400 MG/200ML IV SOLN
400.0000 mg | Freq: Two times a day (BID) | INTRAVENOUS | Status: DC
  Administered 2012-05-31 – 2012-06-10 (×21): 400 mg via INTRAVENOUS
  Filled 2012-05-31 (×24): qty 200

## 2012-05-31 NOTE — Progress Notes (Signed)
Subjective: This man came in yesterday with what appears to be severe pancreatitis, possibly related to gallstone disease. He denies use of alcohol whatsoever for the last 5 years. He has marijuana in his urine drug screen.           Physical Exam: Blood pressure 160/112, pulse 87, temperature 97.3 F (36.3 C), temperature source Oral, resp. rate 18, height 5\' 7"  (1.702 m), weight 76.6 kg (168 lb 14 oz), SpO2 97.00%. Looks sick. Pale. Abdomen swollen with tenderness in the epigastric area, severe. Bowel sounds not heard. Heart sounds are present and normal. Lung fields are clear. He is alert and orientated at the present time.   Investigations:     Basic Metabolic Panel:  Recent Labs  16/10/96 1855 05/31/12 0512  NA 141 140  K 3.0* 3.9  CL 103 104  CO2 26 22  GLUCOSE 184* 157*  BUN 11 7  CREATININE 1.03 0.71  CALCIUM 9.4 9.1  MG  --  1.7   Liver Function Tests:  Recent Labs  05/30/12 1855 05/31/12 0512  AST 191* 318*  ALT 287* 431*  ALKPHOS 529* 582*  BILITOT 1.6* 3.1*  PROT 7.6 8.1  ALBUMIN 4.1 4.3     CBC:  Recent Labs  05/30/12 1855 05/31/12 0512  WBC 21.6* 17.9*  NEUTROABS 17.4*  --   HGB 14.8 17.5*  HCT 43.3 50.2  MCV 86.3 85.4  PLT 246 230    Ct Abdomen Pelvis W Contrast  05/31/2012  *RADIOLOGY REPORT*  Clinical Data: Acute pancreatitis, mid abdominal pain  CT ABDOMEN AND PELVIS WITH CONTRAST  Technique:  Multidetector CT imaging of the abdomen and pelvis was performed following the standard protocol during bolus administration of intravenous contrast.  Contrast: 50mL OMNIPAQUE IOHEXOL 300 MG/ML  SOLN, 1 OMNIPAQUE IOHEXOL 300 MG/ML  SOLN, OMNIPAQUE IOHEXOL 300 MG/ML  SOLN  Comparison: Chest and abdomen films of 05/30/2012  Findings: There is bibasilar linear atelectasis or scarring present.  No effusion is seen. There does appear to be a small hiatal hernia present.  The liver enhances and there is prominence of the central  intrahepatic ducts.  Surgical clips are present from prior cholecystectomy.  However there is marked edema of the neck and proximal body of the pancreas with considerable peripancreatic exudate consistent with acute pancreatitis.  The common bile duct is somewhat prominent measuring 9 mm in diameter but no definite calculus is seen. Stricture or mass cannot be excluded although no obvious mass is evident.  The fact that the head of the pancreatic parenchyma enhances as does the distal body and tail, but there is no enhancement of the distal head and proximal body is worrisome for pancreatic necrosis.  There is edema of the adjacent descending duodenum and distal stomach.  No discrete abscess or pseudocyst is evident.  The adrenal glands and spleen are unremarkable.  The stomach is moderately distended with contrast with no gross abnormality other than the previously noted edema.  The kidneys enhance with several small nonobstructing renal calculi noted bilaterally.  The abdominal aorta is normal in caliber.  No adenopathy is seen.  Pancreatic exudate extends into the right retroperitoneum.  There is also a moderate amount of free fluid noted layering within the pelvis.  The urinary bladder is unremarkable.  The prostate is normal in size.  Scattered rectosigmoid colonic diverticula are seen.  No abnormality of the terminal ileum or appendix is noted. Degenerative change is noted involving the facet joints  of L4-5 and L5-S1.  IMPRESSION:  1.  Acute pancreatitis with edema of the distal head and proximal body the pancreas and considerable surrounding exudate extending into the pelvis.  The lack of enhancement of the distal head and proximal body the pancreas is worrisome for pancreatic necrosis. No abscess or pseudocyst is evident currently. 2.  Prominent common bile duct and intrahepatic ducts may be due to edema but a non-visualized distal calculus, stricture or mass cannot be excluded as the etiology. 3.  Moderate  amount of free fluid in the pelvis. 4.  Small hiatal hernia. 5.  Small nonobstructing bilateral renal calculi.   Original Report Authenticated By: Dwyane Dee, M.D.    Dg Abd Acute W/chest  05/30/2012  *RADIOLOGY REPORT*  Clinical Data: Abdominal pain and vomiting.  ACUTE ABDOMEN SERIES (ABDOMEN 2 VIEW & CHEST 1 VIEW)  Comparison: 12/05/2008  Findings: Lungs are clear. Heart and mediastinum are within normal limits.  Trachea is midline.  No evidence of free air.  Nonspecific bowel gas pattern.  Surgical clips in the right upper abdomen. Degenerative changes in the lower lumbar spine.  Phleboliths in the pelvis.  IMPRESSION: No acute findings.   Original Report Authenticated By: Richarda Overlie, M.D.       Medications: I have reviewed the patient's current medications.  Impression: 1. Severe acute pancreatitis, unclear etiology. 2. Abnormal liver function tests,? Related to gallstone pancreatitis. 3. Coronary artery disease, stable. 4. History of fibromyalgia.     Plan: 1. Increase IV fluid rate to 200 cc now. 2. Stat ultrasound of the right upper quadrant to look at [facility] pathology. 3. Stat ABG. 4. Gastroenterology consultation this morning . I think this man is sick enough that we may need to transfer him to the step down unit for his management of pancreatitis. We will see what the ABG shows.     LOS: 1 day   Wilson Singer Pager [phone]  05/31/2012, 10:15 AM

## 2012-05-31 NOTE — Consult Note (Signed)
Referring Provider: No ref. provider found Primary Care Physician:  No primary provider on file. Primary Gastroenterologist:  Dr. Jena Gauss  Reason for Consultation:  57 year old Hispanic gentleman admitted to the hospital last evening with less than 24-hour history of severe periumbilical/ abdominal pain.  Serum lipase greater than 3000. Pancreatic contrast CT and ultrasound demonstrated a dilated bile duct at 9 mm marked inflammatory changes of the pancreas with peripancreatic edema and lack of enhancement in the body and tail of the pancreas. LFTs abnormal -  with AST and ALT of 318, 431, respectively. Alkaline Phosphatase 582 this morning; total bilirubin 3.1. These numbers have increased from those noted upon admission. Gallbladder removed in [place]. His BUN and creatinine have remained normal and have actually improved with aggressive IV hydration. Hemoglobin and hematocrit have increased. Pain is now approximately 7/10 with IV narcotic therapy. Patient has a history of alcohol abuse but none reported 5 years. No prior history pancreatitis. Serum triglycerides in the 170 range. No high risk medications. No family history of pancreatitis.    Past Medical History  Diagnosis Date  . Back pain   . Hypertension   . Asthma   . Thyroid disease   . High cholesterol   . Stroke   . Fibromyalgia   . Myocardial infarct     Past Surgical History  Procedure Laterality Date  . Back surgery    . Cholecystectomy     Finger surgery for partial amputation.    Prior to Admission medications   Medication Sig Start Date End Date Taking? Authorizing Provider  aspirin 325 MG tablet Take 325 mg by mouth daily.     Yes Historical Provider, MD  DULoxetine (CYMBALTA) 60 MG capsule Take 60 mg by mouth daily.     Yes Historical Provider, MD  esomeprazole (NEXIUM) 40 MG capsule Take 40 mg by mouth daily before breakfast.   Yes Historical Provider, MD  gabapentin (NEURONTIN) 300 MG capsule Take 300 mg by  mouth 3 (three) times daily.     Yes Historical Provider, MD  HYDROcodone-acetaminophen (NORCO/VICODIN) 5-325 MG per tablet Take 1 tablet by mouth 2 (two) times daily as needed for pain.   Yes Historical Provider, MD  levothyroxine (SYNTHROID, LEVOTHROID) 100 MCG tablet Take 100 mcg by mouth daily.     Yes Historical Provider, MD  simvastatin (ZOCOR) 80 MG tablet Take 80 mg by mouth at bedtime.     Yes Historical Provider, MD  nitroGLYCERIN (NITROSTAT) 0.4 MG SL tablet Place 0.4 mg under the tongue every 5 (five) minutes as needed for chest pain.     Historical Provider, MD    Current Facility-Administered Medications  Medication Dose Route Frequency Provider Last Rate Last Dose  . 0.9 % NaCl with KCl 20 mEq/ L  infusion   Intravenous Continuous Nimish C Karilyn Cota, MD 200 mL/hr at 05/31/12 1047    . bisacodyl (DULCOLAX) suppository 10 mg  10 mg Rectal Daily PRN Vania Rea, MD      . ciprofloxacin (CIPRO) IVPB 400 mg  400 mg Intravenous Q12H Nimish C Gosrani, MD      . diphenhydrAMINE (BENADRYL) injection 12.5 mg  12.5 mg Intravenous Q6H PRN Vania Rea, MD      . enoxaparin (LOVENOX) injection 40 mg  40 mg Subcutaneous Q24H Vania Rea, MD   40 mg at 05/31/12 0810  . fentaNYL 10 mcg/mL PCA injection   Intravenous Q4H Vania Rea, MD   272.9 mcg/hr at 05/31/12 1137  . hydrALAZINE (APRESOLINE) injection  10 mg  10 mg Intravenous Q4H PRN Vania Rea, MD      . iohexol (OMNIPAQUE) 300 MG/ML solution 100 mL  100 mL Intravenous Once PRN Medication Radiologist, MD      . levothyroxine (SYNTHROID, LEVOTHROID) injection 50 mcg  50 mcg Intravenous QAC breakfast Vania Rea, MD   50 mcg at 05/31/12 1046  . metroNIDAZOLE (FLAGYL) IVPB 500 mg  500 mg Intravenous Q8H Nimish C Gosrani, MD      . naloxone [facility]) injection 0.4 mg  0.4 mg Intravenous PRN Vania Rea, MD       And  . sodium chloride 0.9 % injection 9 mL  9 mL Intravenous PRN Vania Rea, MD      .  ondansetron [facility]) injection 4 mg  4 mg Intravenous Q4H PRN Vania Rea, MD      . pantoprazole (PROTONIX) 80 mg in sodium chloride 0.9 % 100 mL IVPB  80 mg Intravenous Q12H Vania Rea, MD   80 mg at 05/31/12 1047  . sodium chloride 0.9 % injection 3 mL  3 mL Intravenous Q12H Vania Rea, MD   3 mL at 05/31/12 0000    Allergies as of 05/30/2012 - Review Complete 05/30/2012  Allergen Reaction Noted  . Amoxicillin  09/18/2010  . Dilaudid (hydromorphone hcl) Swelling 09/18/2010    Family History  Problem Relation Age of Onset  . Diabetes Mother   . Diabetes Brother   . Diabetes Brother   . Cancer Father     History   Social History  . Marital Status: Divorced    Spouse Name: N/A    Number of Children: N/A  . Years of Education: N/A   Occupational History  . Not on file.   Social History Main Topics  . Smoking status: Passive Smoke Exposure - Never Smoker  . Smokeless tobacco: Not on file  . Alcohol Use: Yes     Comment: occasional  . Drug Use: No  . Sexually Active:    Other Topics Concern  . Not on file   Social History Narrative  . No narrative on file    Review of Systems: Gen: Denies any fever, chills, sweats, anorexia, fatigue, weakness, malaise, weight loss, and sleep disorder CV: Denies chest pain, angina, palpitations, syncope, orthopnea, PND, peripheral edema, and claudication. Resp: e, cough, sputum, wheezing, coughing up blood, and pleurisy. GI:    Denies dysphagia or odynophagia. Derm: Denies rash, itching, dry skin, hives, moles, warts, or unhealing ulcers.  Psych: Denies depression, anxiety, memory loss, suicidal ideation, hallucinations, paranoia, and confusion. Heme: Denies bruising, bleeding, and enlarged lymph nodes.   Physical Exam: Vital signs in last 24 hours: Temp:  [97.2 F (36.2 C)-97.8 F (36.6 C)] 97.3 F (36.3 C) (03/21 0443) Pulse Rate:  [63-112] 87 (03/21 0443) Resp:  [18-29] 18 (03/21 1137) BP:  (144-175)/(79-112) 160/112 mmHg (03/21 0443) SpO2:  [96 %-100 %] 96 % (03/21 1137) FiO2 (%):  [44 %] 44 % (03/21 0000) Weight:  [165 lb (74.844 kg)-168 lb 14 oz (76.6 kg)] 168 lb 14 oz (76.6 kg) (03/20 2321) Last BM Date: 05/30/12 General:   Alert,  ill-appearing gentleman. Who is alert and answers questions.  Head:  Normocephalic and atraumatic. Eyes:  Sclera clear, no icterus.   Conjunctiva pink. Ears:  Normal auditory acuity. Nose:  No deformity, discharge,  or lesions. Mouth:  No deformity or lesions, dentition normal. Neck:  Supple; no masses or thyromegaly. Lungs:  Clear throughout to auscultation.   No wheezes,  crackles, or rhonchi. No acute distress. Heart:  Regular rate and rhythm; no murmurs, clicks, rubs,  or gallops. Abdomen:  Full. Positive bowel sounds. He is exquisitely diffusely tender. The there's some voluntary guarding. No obvious mass or organomegaly   Pulses:  Normal pulses noted. Extremities:  Without clubbing or edema. Neurologic:  Alert and  oriented x4;  grossly normal neurologically. Cervical Nodes:  No significant cervical adenopathy. Psych:  Alert and cooperative. Normal mood and affect.  Intake/Output from previous day: 03/20 0701 - 03/21 0700 In: -  Out: 1250 [Urine:1250] Intake/Output this shift:    Lab Results:  Recent Labs  05/30/12 1855 05/31/12 0512  WBC 21.6* 17.9*  HGB 14.8 17.5*  HCT 43.3 50.2  PLT 246 230   BMET  Recent Labs  05/30/12 1855 05/31/12 0512  NA 141 140  K 3.0* 3.9  CL 103 104  CO2 26 22  GLUCOSE 184* 157*  BUN 11 7  CREATININE 1.03 0.71  CALCIUM 9.4 9.1   LFT  Recent Labs  05/31/12 0512  PROT 8.1  ALBUMIN 4.3  AST 318*  ALT 431*  ALKPHOS 582*  BILITOT 3.1*   PT/INR  Recent Labs  05/30/12 2327  LABPROT 12.5  INR 0.94    Studies/Results: Ct Abdomen Pelvis W Contrast  05/31/2012  *RADIOLOGY REPORT*  Clinical Data: Acute pancreatitis, mid abdominal pain  CT ABDOMEN AND PELVIS WITH CONTRAST   Technique:  Multidetector CT imaging of the abdomen and pelvis was performed following the standard protocol during bolus administration of intravenous contrast.  Contrast: 50mL OMNIPAQUE IOHEXOL 300 MG/ML  SOLN, 1 OMNIPAQUE IOHEXOL 300 MG/ML  SOLN, OMNIPAQUE IOHEXOL 300 MG/ML  SOLN  Comparison: Chest and abdomen films of 05/30/2012  Findings: There is bibasilar linear atelectasis or scarring present.  No effusion is seen. There does appear to be a small hiatal hernia present.  The liver enhances and there is prominence of the central intrahepatic ducts.  Surgical clips are present from prior cholecystectomy.  However there is marked edema of the neck and proximal body of the pancreas with considerable peripancreatic exudate consistent with acute pancreatitis.  The common bile duct is somewhat prominent measuring 9 mm in diameter but no definite calculus is seen. Stricture or mass cannot be excluded although no obvious mass is evident.  The fact that the head of the pancreatic parenchyma enhances as does the distal body and tail, but there is no enhancement of the distal head and proximal body is worrisome for pancreatic necrosis.  There is edema of the adjacent descending duodenum and distal stomach.  No discrete abscess or pseudocyst is evident.  The adrenal glands and spleen are unremarkable.  The stomach is moderately distended with contrast with no gross abnormality other than the previously noted edema.  The kidneys enhance with several small nonobstructing renal calculi noted bilaterally.  The abdominal aorta is normal in caliber.  No adenopathy is seen.  Pancreatic exudate extends into the right retroperitoneum.  There is also a moderate amount of free fluid noted layering within the pelvis.  The urinary bladder is unremarkable.  The prostate is normal in size.  Scattered rectosigmoid colonic diverticula are seen.  No abnormality of the terminal ileum or appendix is noted. Degenerative change is noted  involving the facet joints of L4-5 and L5-S1.  IMPRESSION:  1.  Acute pancreatitis with edema of the distal head and proximal body the pancreas and considerable surrounding exudate extending into the pelvis.  The lack of  enhancement of the distal head and proximal body the pancreas is worrisome for pancreatic necrosis. No abscess or pseudocyst is evident currently. 2.  Prominent common bile duct and intrahepatic ducts may be due to edema but a non-visualized distal calculus, stricture or mass cannot be excluded as the etiology. 3.  Moderate amount of free fluid in the pelvis. 4.  Small hiatal hernia. 5.  Small nonobstructing bilateral renal calculi.   Original Report Authenticated By: Dwyane Dee, M.D.    Dg Abd Acute W/chest  05/30/2012  *RADIOLOGY REPORT*  Clinical Data: Abdominal pain and vomiting.  ACUTE ABDOMEN SERIES (ABDOMEN 2 VIEW & CHEST 1 VIEW)  Comparison: 12/05/2008  Findings: Lungs are clear. Heart and mediastinum are within normal limits.  Trachea is midline.  No evidence of free air.  Nonspecific bowel gas pattern.  Surgical clips in the right upper abdomen. Degenerative changes in the lower lumbar spine.  Phleboliths in the pelvis.  IMPRESSION: No acute findings.   Original Report Authenticated By: Richarda Overlie, M.D.    US Abdomen Limited Ruq  05/31/2012  *RADIOLOGY REPORT*  Clinical Data: Pancreatitis.  Elevated LFTs.  LIMITED ABDOMINAL ULTRASOUND  Comparison:  05/31/2012 CT.  Findings:  Gallbladder:  Post cholecystectomy.  Common bile duct:  Only the proximal aspect is well delineated secondary to bowel gas with the common bile duct measuring up to 9.3 mm.  Mid to distal common bile duct stone cannot be excluded based on present exam.  Liver:  Dilated intrahepatic biliary ducts.  Free fluid noted.  Limit evaluation of the pancreas.  Please see recent CT report.  IMPRESSION: Post cholecystectomy.  Dilated proximal common bile duct measuring up to 9.3 mm.   Mid to distal common bile duct not  visualized secondary to bowel gas, mid to distal common bile duct stone can not be excluded.  Dilated intrahepatic biliary ducts.   Original Report Authenticated By: Lacy Duverney, M.D.      Impression:  57 year old gentleman with severe pancreatitis. Has a necrotizing component on CT.  Aminotransferase elevation along with alkaline phosphatase and bilirubin suggestive of a stuttering biliary obstruction. Bile duct is mildly dilated.  Biliary dilation and enzyme /bilirubin elevation would be more than expected from  edema induced by acute pancreatitis, alone.  These numbers would  inconsistent with occult alcohol ingestion /alcoholic  hepatitis. BUN has remained normal. Hematocrit and hemoglobin have increased somewhat, however. Urine output reportedly only 250 cc since 0300 today according to the nursing staff. He does not appear to be fluid overloaded at this time.   Recommendations:  Place Foley catheter. Increase IV fluids to 250 cc per hour. Follow I's and O's closely. MRCP to determine whether or not common duct stones exist. Consider starting empiric antibiotics (for partial biliary obstruction). Keep strictly n.p.o. for now. Hypokalemia-treatment per attending. My findings and recommendations have been discussed with Dr. Karilyn Cota. Dr. Karilyn Cota will be seeing over the weekend in my absence.

## 2012-05-31 NOTE — Care Management Note (Signed)
    Page 1 of 2   06/14/2012     3:00:15 PM   CARE MANAGEMENT NOTE 06/14/2012  Patient:  Justin [name], Justin [name]   Account Number:  [id number]  Date Initiated:  05/31/2012  Documentation initiated by:  Rosemary Holms  Subjective/Objective Assessment:   Pt admitted from home. Girlfriend at bedside with pt. States his PCP is Dr. Sherril Croon.     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   (If response is "NO", the following Medicare IM given date fields will be blank) Date Medicare IM given:   Date Additional Medicare IM given:    Discharge Disposition:  SKILLED NURSING FACILITY  Per UR Regulation:  Reviewed for med. necessity/level of care/duration of stay  If discussed at Long Length of Stay Meetings, dates discussed:   06/06/2012  06/11/2012  06/13/2012    Comments:  06/14/12 14:58 Letha Cape RN, BSN [phone] patient is for dc today to [facility] in [place], per CSW.  06/13/12 15:13 Letha Cape RN, BSN [phone] patient does not have LTAC benefits, per Surgery it is not safe for patient to go home secondary to infection. Patient has decided to go to snf , CSW came to speak with him and his wife about facilties.  Wife is interested in [facility] and the [facility], CSW aware.  06/12/12 11:58 Letha Cape RN, BSN [phone] pateint lives with spouse, patient has necrotizing pancreatitis with phlegmon, pt is not able to tolerate clears at this time, has ng feeding tube, pt for EUS when improves, this is a slow progress , conts on iv abx . Pt does not have any Ltac benefits.  NCM will continue to follow for dc needs, hopefully patient will be able to start to tolerate   po's soon and diet could be advance to dc home.  05/31/12 1030 Amy Leanord Hawking RN BSN CM

## 2012-05-31 NOTE — Progress Notes (Signed)
UR Chart Review Completed  

## 2012-05-31 NOTE — Progress Notes (Signed)
Informed pt of orders to start foley catheter. Pt refused to have foley.

## 2012-06-01 ENCOUNTER — Encounter (HOSPITAL_COMMUNITY): Payer: Self-pay

## 2012-06-01 ENCOUNTER — Inpatient Hospital Stay (HOSPITAL_COMMUNITY)
Admit: 2012-06-01 | Discharge: 2012-06-01 | Disposition: A | Discharge status: Home / Self Care | Payer: Medicare Other | Attending: Internal Medicine | Admitting: Internal Medicine

## 2012-06-01 DIAGNOSIS — E7800 Pure hypercholesterolemia, unspecified: Secondary | ICD-10-CM

## 2012-06-01 LAB — COMPREHENSIVE METABOLIC PANEL
ALT: 274 U/L — ABNORMAL HIGH (ref 0–53)
BUN: 9 mg/dL (ref 6–23)
CO2: 25 mEq/L (ref 19–32)
Calcium: 8.6 mg/dL (ref 8.4–10.5)
Creatinine, Ser: 0.8 mg/dL (ref 0.50–1.35)
GFR calc Af Amer: >90 mL/min (ref >90)
GFR calc non Af Amer: >90 mL/min (ref >90)
Glucose, Bld: 137 mg/dL — ABNORMAL HIGH (ref 70–99)
Sodium: 138 mEq/L (ref 135–145)
Total Protein: 6.9 g/dL (ref 6.0–8.3)

## 2012-06-01 LAB — BILIRUBIN, DIRECT: Bilirubin, Direct: 1.3 mg/dL — ABNORMAL HIGH (ref 0.0–0.3)

## 2012-06-01 LAB — MRSA PCR SCREENING: MRSA by PCR: NEGATIVE

## 2012-06-01 LAB — CBC
HCT: 48.2 % (ref 39.0–52.0)
Hemoglobin: 16.2 g/dL (ref 13.0–17.0)
MCHC: 33.6 g/dL (ref 30.0–36.0)
WBC: 19.9 10*3/uL — ABNORMAL HIGH (ref 4.0–10.5)

## 2012-06-01 MED ORDER — FENTANYL CITRATE 0.05 MG/ML IJ SOLN
50.0000 ug | Freq: Once | INTRAMUSCULAR | Status: AC
  Administered 2012-06-01: 50 ug via INTRAVENOUS

## 2012-06-01 MED ORDER — FENTANYL CITRATE 0.05 MG/ML IJ SOLN
50.0000 ug | INTRAMUSCULAR | Status: DC | PRN
  Administered 2012-06-01: 100 ug via INTRAVENOUS
  Filled 2012-06-01: qty 2

## 2012-06-01 MED ORDER — KETOROLAC TROMETHAMINE 15 MG/ML IJ SOLN
15.0000 mg | Freq: Four times a day (QID) | INTRAMUSCULAR | Status: DC
  Administered 2012-06-01 – 2012-06-02 (×3): 15 mg via INTRAVENOUS
  Filled 2012-06-01 (×7): qty 1

## 2012-06-01 MED ORDER — GADOBENATE DIMEGLUMINE 529 MG/ML IV SOLN
20.0000 mL | Freq: Once | INTRAVENOUS | Status: AC | PRN
  Administered 2012-06-01: 20 mL via INTRAVENOUS

## 2012-06-01 MED ORDER — CHLORPROMAZINE HCL 25 MG/ML IJ SOLN
12.5000 mg | Freq: Once | INTRAMUSCULAR | Status: AC
  Administered 2012-06-01: 12.5 mg via INTRAVENOUS
  Filled 2012-06-01: qty 0.5

## 2012-06-01 MED ORDER — FENTANYL CITRATE 0.05 MG/ML IJ SOLN
50.0000 ug | INTRAMUSCULAR | Status: DC | PRN
  Administered 2012-06-01 – 2012-06-02 (×9): 100 ug via INTRAVENOUS
  Filled 2012-06-01 (×9): qty 2

## 2012-06-01 MED ORDER — METOPROLOL TARTRATE 1 MG/ML IV SOLN
5.0000 mg | Freq: Four times a day (QID) | INTRAVENOUS | Status: DC
  Administered 2012-06-01 – 2012-06-03 (×8): 5 mg via INTRAVENOUS
  Filled 2012-06-01 (×11): qty 5

## 2012-06-01 NOTE — Progress Notes (Addendum)
[facility] PROGRESS NOTE  Justin [name] JYN:[id number] DOB: December 08, 1955 DOA: 05/30/2012 PCP: No primary provider on file.  Assessment/Plan: Active Problems:   Back pain   High cholesterol   Fibromyalgia   Acute pancreatitis   CAD (coronary artery disease) of artery bypass graft   Hypokalemia   Abnormal LFTs (liver function tests)    1. Acute pancreatitis/transaminitis: Patient presented with abdominal pain and vomiting. Lipase was >3000. AST 318, ALT of 431 and Alkaline Phosphatase 582, T.Bil 3.1. Imaging studies demonstrated marked edema of the neck and proximal body of the pancreas with considerable peripancreatic exudate consistent with acute pancreatitis. The common bile duct is somewhat prominent measuring 9 mm in diameter but no definite calculus is seen. Dr Jena Gauss provided GI consultation, and has opined that patient has aminotransferase elevation along with alkaline phosphatase and bilirubin suggestive of a stuttering biliary obstruction. He recommended MRCP, Serum TG is 171. Managing with analgesic, bowel rest and aggressive iv fluids. Following Lipase/LFTs. Will consult GI. Dr Jena Gauss felt that there was a necrotic component to patient's pancreatitis, so he was placed on Ciprofloxacin/Flagyl, now day #2. 2. Hypokalemia: Repleting as indicated.   3. Back pain/Fibromyalgia: These are chronic, and pre-admission, patient was under care of Drs. Earlean Shawl. He is reportedly scheduled for surgery in the near future. Not currently problematic.  4. HTN/Tachycardi:; Per patient, he has a previous history of HTN, but had been taken off his anti-hypertensives, soon after his CVA approximately 5 years ago. BP is currently elevated, with SBP in the 150s, and has a sinus tachycardia of about 1115-120. Pain is the likely culprit. Addressing pain as appropriate, but will utilize iv Lopressor for tachycardia and HTN.      Code Status: Full Code.  Family Communication:  Disposition Plan:  To be determined.    Brief narrative: 57 y.o. Male with history of cholecystectomy, DJD/chronic back pain, fibromyalgia, s/p previous CVA with minimal deficit, presenting with severe abdominal pain and vomiting since night of 05/30/12. Patient's wife has a habit of bringing him a milkshake every night, he was nauseous last night and after the milkshake, got much worse. Since AM of 05/31/12, had been vomiting,  unable to keep anything down. Because the pain was intolerable, he came to the emergency room, where blood work revealed a markedly elevated lipase and abnormal LFTs. Patient was initially admitted to [facility], and GI consultation was provided by Dr Eula Listen, who has recommended MRCP. On patient's request, he was transferred to [facility] today. On route, MD was contacted by [facility], to say that patient was in acute pain, despite iv Fentanyl, was tachypneic, tachycardic. After discussion with [facility] RN, patient was diverted to SDU.    Consultants:  Dr Jena Gauss, GI.   Procedures:  Abdominal/Pelvic CT.  U/S abdomen.   Antibiotics:  Ciprofloxacin 05/31/12>>>  Flagyl 05/31/12.   HPI/Subjective: C/O epigastric pain.   Objective: Vital signs in last 24 hours: Temp:  [98 F (36.7 C)-98.8 F (37.1 C)] 98.8 F (37.1 C) (03/22 1505) Pulse Rate:  [92-114] 114 (03/22 1505) Resp:  [16-26] 26 (03/22 1505) BP: (129-144)/(73-88) 129/73 mmHg (03/22 0227) SpO2:  [95 %-99 %] 95 % (03/22 1505) Weight:  [79.4 kg (175 lb 0.7 oz)-80.105 kg (176 lb 9.6 oz)] 80.105 kg (176 lb 9.6 oz) (03/22 1505) Weight change: 4.556 kg (10 lb 0.7 oz) Last BM Date: 05/31/12  Intake/Output from previous day: 03/21 0701 - 03/22 0700 In: 5144.6 [P.O.:30; I.V.:4214.6; IV Piggyback:900] Out: 2350 [Urine:2350]  Physical Exam: General: In obvious pain, alert, communicative, fully oriented, not short of breath at rest.  HEENT:  No clinical pallor, no jaundice, no conjunctival injection or discharge. NECK:  Supple,  JVP not seen, no carotid bruits, no palpable lymphadenopathy, no palpable goiter. CHEST:  Clinically clear to auscultation, no wheezes, no crackles. HEART:  Sounds 1 and 2 heard, normal, regular, tachycardic, no murmurs. ABDOMEN:  Full, tender, unable to palpate organs, secondary to pain. Bowel sounds heard. GENITALIA:  Not examined. LOWER EXTREMITIES:  No pitting edema, palpable peripheral pulses. MUSCULOSKELETAL SYSTEM:  Unremarkable. CENTRAL NERVOUS SYSTEM:  No focal neurologic deficit on gross examination.  Lab Results:  Recent Labs  05/31/12 0512 06/01/12 0635  WBC 17.9* 19.9*  HGB 17.5* 16.2  HCT 50.2 48.2  PLT 230 195    Recent Labs  05/31/12 0512 06/01/12 0635  NA 140 138  K 3.9 3.6  CL 104 102  CO2 22 25  GLUCOSE 157* 137*  BUN 7 9  CREATININE 0.71 0.80  CALCIUM 9.1 8.6   No results found for this or any previous visit (from the past 240 hour(s)).   Studies/Results: Ct Abdomen Pelvis W Contrast  05/31/2012  *RADIOLOGY REPORT*  Clinical Data: Acute pancreatitis, mid abdominal pain  CT ABDOMEN AND PELVIS WITH CONTRAST  Technique:  Multidetector CT imaging of the abdomen and pelvis was performed following the standard protocol during bolus administration of intravenous contrast.  Contrast: 50mL OMNIPAQUE IOHEXOL 300 MG/ML  SOLN, 1 OMNIPAQUE IOHEXOL 300 MG/ML  SOLN, OMNIPAQUE IOHEXOL 300 MG/ML  SOLN  Comparison: Chest and abdomen films of 05/30/2012  Findings: There is bibasilar linear atelectasis or scarring present.  No effusion is seen. There does appear to be a small hiatal hernia present.  The liver enhances and there is prominence of the central intrahepatic ducts.  Surgical clips are present from prior cholecystectomy.  However there is marked edema of the neck and proximal body of the pancreas with considerable peripancreatic exudate consistent with acute pancreatitis.  The common bile duct is somewhat prominent measuring 9 mm in diameter but no definite  calculus is seen. Stricture or mass cannot be excluded although no obvious mass is evident.  The fact that the head of the pancreatic parenchyma enhances as does the distal body and tail, but there is no enhancement of the distal head and proximal body is worrisome for pancreatic necrosis.  There is edema of the adjacent descending duodenum and distal stomach.  No discrete abscess or pseudocyst is evident.  The adrenal glands and spleen are unremarkable.  The stomach is moderately distended with contrast with no gross abnormality other than the previously noted edema.  The kidneys enhance with several small nonobstructing renal calculi noted bilaterally.  The abdominal aorta is normal in caliber.  No adenopathy is seen.  Pancreatic exudate extends into the right retroperitoneum.  There is also a moderate amount of free fluid noted layering within the pelvis.  The urinary bladder is unremarkable.  The prostate is normal in size.  Scattered rectosigmoid colonic diverticula are seen.  No abnormality of the terminal ileum or appendix is noted. Degenerative change is noted involving the facet joints of L4-5 and L5-S1.  IMPRESSION:  1.  Acute pancreatitis with edema of the distal head and proximal body the pancreas and considerable surrounding exudate extending into the pelvis.  The lack of enhancement of the distal head and proximal body the pancreas is worrisome for pancreatic necrosis. No abscess or pseudocyst is evident  currently. 2.  Prominent common bile duct and intrahepatic ducts may be due to edema but a non-visualized distal calculus, stricture or mass cannot be excluded as the etiology. 3.  Moderate amount of free fluid in the pelvis. 4.  Small hiatal hernia. 5.  Small nonobstructing bilateral renal calculi.   Original Report Authenticated By: Dwyane Dee, M.D.    Dg Abd Acute W/chest  05/30/2012  *RADIOLOGY REPORT*  Clinical Data: Abdominal pain and vomiting.  ACUTE ABDOMEN SERIES (ABDOMEN 2 VIEW & CHEST 1  VIEW)  Comparison: 12/05/2008  Findings: Lungs are clear. Heart and mediastinum are within normal limits.  Trachea is midline.  No evidence of free air.  Nonspecific bowel gas pattern.  Surgical clips in the right upper abdomen. Degenerative changes in the lower lumbar spine.  Phleboliths in the pelvis.  IMPRESSION: No acute findings.   Original Report Authenticated By: Richarda Overlie, M.D.    US Abdomen Limited Ruq  05/31/2012  *RADIOLOGY REPORT*  Clinical Data: Pancreatitis.  Elevated LFTs.  LIMITED ABDOMINAL ULTRASOUND  Comparison:  05/31/2012 CT.  Findings:  Gallbladder:  Post cholecystectomy.  Common bile duct:  Only the proximal aspect is well delineated secondary to bowel gas with the common bile duct measuring up to 9.3 mm.  Mid to distal common bile duct stone cannot be excluded based on present exam.  Liver:  Dilated intrahepatic biliary ducts.  Free fluid noted.  Limit evaluation of the pancreas.  Please see recent CT report.  IMPRESSION: Post cholecystectomy.  Dilated proximal common bile duct measuring up to 9.3 mm.   Mid to distal common bile duct not visualized secondary to bowel gas, mid to distal common bile duct stone can not be excluded.  Dilated intrahepatic biliary ducts.   Original Report Authenticated By: Lacy Duverney, M.D.     Medications: Scheduled Meds: . ciprofloxacin  400 mg Intravenous Q12H  . enoxaparin (LOVENOX) injection  40 mg Subcutaneous Q24H  . levothyroxine  50 mcg Intravenous QAC breakfast  . metronidazole  500 mg Intravenous Q8H  . pantoprazole (PROTONIX) IV  80 mg Intravenous Q12H  . sodium chloride  3 mL Intravenous Q12H   Continuous Infusions: . 0.9 % NaCl with KCl 20 mEq / L 225 mL/hr at 06/01/12 1009   PRN Meds:.bisacodyl, diphenhydrAMINE, fentaNYL, hydrALAZINE, naloxone, ondansetron (ZOFRAN) IV, sodium chloride    LOS: 2 days   Frederika Hukill,CHRISTOPHER  [facility] Pager [phone]. If 8PM-8AM, please contact night-coverage at [email or web address], password  [facility] 06/01/2012, 3:16 PM  LOS: 2 days

## 2012-06-01 NOTE — Progress Notes (Signed)
Chart reviewed. Patient did not get MRCP yesterday, now patients girlfriend is upset and requesting transfer to [facility]  Discussed with MRI tech at [facility], Drs. Etna and Oti  [facility] PROGRESS NOTE  KEHINDE BOWDISH AVW:[id number] DOB: 19-Apr-1955 DOA: 05/30/2012 PCP: Sherril Croon  Assessment/Plan: Severe pancreatitis with increased LFTs and prominend CBD and intrahepatic ducts:   Denies EtOH, s/p cholecystectomy 4 years ago.  MRCP today at [facility] at 2 pm.  Pain not controlled.  Change to fentanyl 50-100 mcg q1h prn.  Did not do well with PCA, per girlfriend.  Pt and girlfriend request transfer to [facility].  Have discussed with Dr. Brien Few who has kindly agreed to accept patient in transer.  UOP good.  Remains NPO    High cholesterol    Fibromyalgia    CAD (coronary artery disease) of artery bypass graft  Hypothyroidism  Consultants: Rourk, GI  Procedures: none  Antibiotics:  Cipro, Flagyl 3/21  HPI/Subjective: C/o pain, uncontrolled with fentanyl 50 mg q2 prn  Objective: Filed Vitals:   05/31/12 1952 05/31/12 2100 06/01/12 0227 06/01/12 0600  BP:  144/88 129/73   Pulse: 98 92 101   Temp:  98.2 F (36.8 C) 98 F (36.7 C)   TempSrc:  Oral Oral   Resp: 24 16 22    Height:      Weight:    79.4 kg (175 lb 0.7 oz)  SpO2: 97% 96% 99%     Intake/Output Summary (Last 24 hours) at 06/01/12 0943 Last data filed at 06/01/12 0200  Gross per 24 hour  Intake 5144.58 ml  Output   2350 ml  Net 2794.58 ml   Filed Weights   05/30/12 1824 05/30/12 2321 06/01/12 0600  Weight: 74.844 kg (165 lb) 76.6 kg (168 lb 14 oz) 79.4 kg (175 lb 0.7 oz)    Exam:  Gen:  Uncomfortable, sitting in chair, clutching pillow, alert, oriented Lungs: CTA without WRR CV:  RRR without MGR Abd:  Soft, decreased bowel sounds, distended, tender in epigastrum Ext:  No CCE  Data Reviewed: Basic Metabolic Panel:  Recent Labs Lab 05/30/12 1855 05/31/12 0512 06/01/12 0635  NA 141 140 138  K 3.0* 3.9 3.6  CL  103 104 102  CO2 26 22 25   GLUCOSE 184* 157* 137*  BUN 11 7 9   CREATININE 1.03 0.71 0.80  CALCIUM 9.4 9.1 8.6  MG  --  1.7  --    Liver Function Tests:  Recent Labs Lab 05/30/12 1855 05/31/12 0512 06/01/12 0635  AST 191* 318* 83*  ALT 287* 431* 274*  ALKPHOS 529* 582* 442*  BILITOT 1.6* 3.1* 2.7*  PROT 7.6 8.1 6.9  ALBUMIN 4.1 4.3 3.2*    Recent Labs Lab 05/30/12 1855  LIPASE >3000*   No results found for this basename: AMMONIA,  in the last 168 hours CBC:  Recent Labs Lab 05/30/12 1855 05/31/12 0512 06/01/12 0635  WBC 21.6* 17.9* 19.9*  NEUTROABS 17.4*  --   --   HGB 14.8 17.5* 16.2  HCT 43.3 50.2 48.2  MCV 86.3 85.4 86.2  PLT 246 230 195   Cardiac Enzymes:  Recent Labs Lab 05/30/12 2327  TROPONINI <0.30   BNP (last 3 results) No results found for this basename: PROBNP,  in the last 8760 hours CBG: No results found for this basename: GLUCAP,  in the last 168 hours  No results found for this or any previous visit (from the past 240 hour(s)).   Studies: Ct Abdomen Pelvis W Contrast  05/31/2012  *RADIOLOGY REPORT*  Clinical Data: Acute pancreatitis, mid abdominal pain  CT ABDOMEN AND PELVIS WITH CONTRAST  Technique:  Multidetector CT imaging of the abdomen and pelvis was performed following the standard protocol during bolus administration of intravenous contrast.  Contrast: 50mL OMNIPAQUE IOHEXOL 300 MG/ML  SOLN, 1 OMNIPAQUE IOHEXOL 300 MG/ML  SOLN, OMNIPAQUE IOHEXOL 300 MG/ML  SOLN  Comparison: Chest and abdomen films of 05/30/2012  Findings: There is bibasilar linear atelectasis or scarring present.  No effusion is seen. There does appear to be a small hiatal hernia present.  The liver enhances and there is prominence of the central intrahepatic ducts.  Surgical clips are present from prior cholecystectomy.  However there is marked edema of the neck and proximal body of the pancreas with considerable peripancreatic exudate consistent with acute  pancreatitis.  The common bile duct is somewhat prominent measuring 9 mm in diameter but no definite calculus is seen. Stricture or mass cannot be excluded although no obvious mass is evident.  The fact that the head of the pancreatic parenchyma enhances as does the distal body and tail, but there is no enhancement of the distal head and proximal body is worrisome for pancreatic necrosis.  There is edema of the adjacent descending duodenum and distal stomach.  No discrete abscess or pseudocyst is evident.  The adrenal glands and spleen are unremarkable.  The stomach is moderately distended with contrast with no gross abnormality other than the previously noted edema.  The kidneys enhance with several small nonobstructing renal calculi noted bilaterally.  The abdominal aorta is normal in caliber.  No adenopathy is seen.  Pancreatic exudate extends into the right retroperitoneum.  There is also a moderate amount of free fluid noted layering within the pelvis.  The urinary bladder is unremarkable.  The prostate is normal in size.  Scattered rectosigmoid colonic diverticula are seen.  No abnormality of the terminal ileum or appendix is noted. Degenerative change is noted involving the facet joints of L4-5 and L5-S1.  IMPRESSION:  1.  Acute pancreatitis with edema of the distal head and proximal body the pancreas and considerable surrounding exudate extending into the pelvis.  The lack of enhancement of the distal head and proximal body the pancreas is worrisome for pancreatic necrosis. No abscess or pseudocyst is evident currently. 2.  Prominent common bile duct and intrahepatic ducts may be due to edema but a non-visualized distal calculus, stricture or mass cannot be excluded as the etiology. 3.  Moderate amount of free fluid in the pelvis. 4.  Small hiatal hernia. 5.  Small nonobstructing bilateral renal calculi.   Original Report Authenticated By: Dwyane Dee, M.D.    Dg Abd Acute W/chest  05/30/2012  *RADIOLOGY  REPORT*  Clinical Data: Abdominal pain and vomiting.  ACUTE ABDOMEN SERIES (ABDOMEN 2 VIEW & CHEST 1 VIEW)  Comparison: 12/05/2008  Findings: Lungs are clear. Heart and mediastinum are within normal limits.  Trachea is midline.  No evidence of free air.  Nonspecific bowel gas pattern.  Surgical clips in the right upper abdomen. Degenerative changes in the lower lumbar spine.  Phleboliths in the pelvis.  IMPRESSION: No acute findings.   Original Report Authenticated By: Richarda Overlie, M.D.    US Abdomen Limited Ruq  05/31/2012  *RADIOLOGY REPORT*  Clinical Data: Pancreatitis.  Elevated LFTs.  LIMITED ABDOMINAL ULTRASOUND  Comparison:  05/31/2012 CT.  Findings:  Gallbladder:  Post cholecystectomy.  Common bile duct:  Only the proximal aspect is well delineated secondary  to bowel gas with the common bile duct measuring up to 9.3 mm.  Mid to distal common bile duct stone cannot be excluded based on present exam.  Liver:  Dilated intrahepatic biliary ducts.  Free fluid noted.  Limit evaluation of the pancreas.  Please see recent CT report.  IMPRESSION: Post cholecystectomy.  Dilated proximal common bile duct measuring up to 9.3 mm.   Mid to distal common bile duct not visualized secondary to bowel gas, mid to distal common bile duct stone can not be excluded.  Dilated intrahepatic biliary ducts.   Original Report Authenticated By: Lacy Duverney, M.D.     Scheduled Meds: . ciprofloxacin  400 mg Intravenous Q12H  . enoxaparin (LOVENOX) injection  40 mg Subcutaneous Q24H  . levothyroxine  50 mcg Intravenous QAC breakfast  . metronidazole  500 mg Intravenous Q8H  . pantoprazole (PROTONIX) IV  80 mg Intravenous Q12H  . sodium chloride  3 mL Intravenous Q12H   Continuous Infusions: . 0.9 % NaCl with KCl 20 mEq / L 225 mL/hr at 06/01/12 1610   Time spent: 60  Ezra Denne L  [facility] . If 7PM-7AM, please contact night-coverage at [email or web address], password [facility] 06/01/2012, 9:43 AM  LOS: 2 days

## 2012-06-02 ENCOUNTER — Encounter (HOSPITAL_COMMUNITY): Payer: Self-pay | Admitting: General Surgery

## 2012-06-02 DIAGNOSIS — K8689 Other specified diseases of pancreas: Secondary | ICD-10-CM

## 2012-06-02 DIAGNOSIS — K859 Acute pancreatitis without necrosis or infection, unspecified: Secondary | ICD-10-CM

## 2012-06-02 LAB — COMPREHENSIVE METABOLIC PANEL
ALT: 144 U/L — ABNORMAL HIGH (ref 0–53)
AST: 28 U/L (ref 0–37)
Albumin: 2.7 g/dL — ABNORMAL LOW (ref 3.5–5.2)
Calcium: 8.4 mg/dL (ref 8.4–10.5)
Chloride: 106 mEq/L (ref 96–112)
Creatinine, Ser: 0.75 mg/dL (ref 0.50–1.35)
Sodium: 141 mEq/L (ref 135–145)
Total Bilirubin: 1.5 mg/dL — ABNORMAL HIGH (ref 0.3–1.2)

## 2012-06-02 LAB — BASIC METABOLIC PANEL
CO2: 22 mEq/L (ref 19–32)
Calcium: 8.3 mg/dL — ABNORMAL LOW (ref 8.4–10.5)
Creatinine, Ser: 0.71 mg/dL (ref 0.50–1.35)
GFR calc Af Amer: >90 mL/min (ref >90)

## 2012-06-02 LAB — PHOSPHORUS: Phosphorus: 1.1 mg/dL — ABNORMAL LOW (ref 2.3–4.6)

## 2012-06-02 MED ORDER — MORPHINE SULFATE 4 MG/ML IJ SOLN
4.0000 mg | Freq: Once | INTRAMUSCULAR | Status: AC
  Administered 2012-06-02: 4 mg via INTRAVENOUS
  Filled 2012-06-02: qty 1

## 2012-06-02 MED ORDER — MORPHINE SULFATE (PF) 1 MG/ML IV SOLN
INTRAVENOUS | Status: DC
  Administered 2012-06-02 (×2): via INTRAVENOUS
  Filled 2012-06-02 (×2): qty 25

## 2012-06-02 MED ORDER — DIPHENHYDRAMINE HCL 12.5 MG/5ML PO ELIX
12.5000 mg | ORAL_SOLUTION | Freq: Four times a day (QID) | ORAL | Status: DC | PRN
  Filled 2012-06-02: qty 5

## 2012-06-02 MED ORDER — SODIUM CHLORIDE 0.9 % IJ SOLN: 9.0000 mL | INTRAMUSCULAR | Status: DC | PRN

## 2012-06-02 MED ORDER — DIPHENHYDRAMINE HCL 50 MG/ML IJ SOLN: 12.5000 mg | Freq: Four times a day (QID) | INTRAMUSCULAR | Status: DC | PRN

## 2012-06-02 MED ORDER — ONDANSETRON HCL 4 MG/2ML IJ SOLN: 4.0000 mg | Freq: Four times a day (QID) | INTRAMUSCULAR | Status: DC | PRN

## 2012-06-02 MED ORDER — MORPHINE SULFATE (PF) 1 MG/ML IV SOLN: INTRAVENOUS | Status: DC

## 2012-06-02 MED ORDER — MORPHINE SULFATE (PF) 1 MG/ML IV SOLN
INTRAVENOUS | Status: DC
  Administered 2012-06-02: 45 mg via INTRAVENOUS
  Administered 2012-06-02: 17:00:00 via INTRAVENOUS
  Administered 2012-06-03: 13.5 mg via INTRAVENOUS
  Administered 2012-06-03: 11:00:00 via INTRAVENOUS
  Administered 2012-06-03: 13.5 mg via INTRAVENOUS
  Administered 2012-06-03: via INTRAVENOUS
  Administered 2012-06-03: 11.34 mg via INTRAVENOUS
  Administered 2012-06-03: 10.5 mg via INTRAVENOUS
  Administered 2012-06-04: 21 mg via INTRAVENOUS
  Administered 2012-06-04: 04:00:00 via INTRAVENOUS
  Administered 2012-06-04: 10.5 mg via INTRAVENOUS
  Administered 2012-06-05: 6 mg via INTRAVENOUS
  Administered 2012-06-05: 14.2 mg via INTRAVENOUS
  Administered 2012-06-05: 9 mg via INTRAVENOUS
  Administered 2012-06-05: 7.5 mg via INTRAVENOUS
  Administered 2012-06-05: 12 mg via INTRAVENOUS
  Administered 2012-06-05: 05:00:00 via INTRAVENOUS
  Administered 2012-06-05: 12 mg via INTRAVENOUS
  Administered 2012-06-05 – 2012-06-06 (×2): via INTRAVENOUS
  Administered 2012-06-06: 16.5 mg via INTRAVENOUS
  Administered 2012-06-06: 6 mg via INTRAVENOUS
  Administered 2012-06-06: 15:00:00 via INTRAVENOUS
  Administered 2012-06-06: 1.5 mg via INTRAVENOUS
  Administered 2012-06-06 (×2): 4.5 mg via INTRAVENOUS
  Administered 2012-06-06: 13.24 mg via INTRAVENOUS
  Administered 2012-06-07: 10.5 mg via INTRAVENOUS
  Administered 2012-06-07: 4.5 mg via INTRAVENOUS
  Administered 2012-06-07: 6 mg via INTRAVENOUS
  Administered 2012-06-07: 3 mg via INTRAVENOUS
  Administered 2012-06-07: 12.99 mg via INTRAVENOUS
  Administered 2012-06-07: 16:00:00 via INTRAVENOUS
  Administered 2012-06-07: 12 mg via INTRAVENOUS
  Administered 2012-06-08: 7.5 mL via INTRAVENOUS
  Administered 2012-06-08: 10 mL via INTRAVENOUS
  Administered 2012-06-08: 3 mg via INTRAVENOUS
  Administered 2012-06-08: 2.6 mL via INTRAVENOUS
  Administered 2012-06-08: 25 mL via INTRAVENOUS
  Administered 2012-06-09: 5 mL via INTRAVENOUS
  Filled 2012-06-02 (×15): qty 25

## 2012-06-02 MED ORDER — POTASSIUM PHOSPHATE DIBASIC 3 MMOLE/ML IV SOLN
20.0000 mmol | Freq: Once | INTRAVENOUS | Status: AC
  Administered 2012-06-02: 20 mmol via INTRAVENOUS
  Filled 2012-06-02: qty 6.67

## 2012-06-02 MED ORDER — NALOXONE HCL 0.4 MG/ML IJ SOLN: 0.4000 mg | INTRAMUSCULAR | Status: DC | PRN

## 2012-06-02 MED ORDER — SODIUM CHLORIDE 0.9 % IV BOLUS (SEPSIS)
1000.0000 mL | Freq: Once | INTRAVENOUS | Status: AC
  Administered 2012-06-02: 1000 mL via INTRAVENOUS

## 2012-06-02 NOTE — Consult Note (Signed)
DAIVD FREDERICKSEN 06/10/1955  [id number].   Primary Care MD: Dr. Sherril Croon Requesting MD: Dr. Erick Blinks Chief Complaint/Reason for Consult: pancreatitis HPI: This is a 57yo hispanic male who for the last several months has been having intermittent epigastric abdominal pain after eating certain foods.  His pain was mild and would go away.  This past Thursday he had a milkshake and then developed severe abdominal pain with nausea and vomiting.  The patient has already had his gallbladder removed.  He used to drink heavily, but quit to just occasional 5 years ago.  He does smoke marijuana daily to assist with fibromyalgia pain.    The patient presented to [facility] where he was initially treated for acute pancreatitis.  He was transferred down here at the risk of his wife.  He has had an MRCP which did not show a retained gallstone, but narrowing of his distal CBD like secondary to extrinsic compression from pancreatic edema.  His LFTs were elevated but are slowly trending down.  His lipase was initially greater than 3000.  On his MRCP, it shows some pancreatic necrosis.  We have been asked to evaluate for further recommendations.  Review of Systems: Please see HPI, otherwise negative.  Family History  Problem Relation Age of Onset  . Diabetes Mother   . Diabetes Brother   . Diabetes Brother   . Cancer Father     Past Medical History  Diagnosis Date  . Back pain   . Hypertension   . Asthma   . Thyroid disease   . High cholesterol   . Stroke   . Fibromyalgia   . Myocardial infarct     Past Surgical History  Procedure Laterality Date  . Back surgery    . Cholecystectomy    . Finger surgery      Social History:  reports that he has been passively smoking.  He does not have any smokeless tobacco history on file. He reports that  drinks alcohol. He reports that he uses illicit drugs (Marijuana).  Allergies:  Allergies  Allergen Reactions  . Amoxicillin   . Dilaudid (Hydromorphone Hcl)  Swelling    Medications Prior to Admission  Medication Sig Dispense Refill  . aspirin 325 MG tablet Take 325 mg by mouth daily.        . DULoxetine (CYMBALTA) 60 MG capsule Take 60 mg by mouth daily.        Marland Kitchen esomeprazole (NEXIUM) 40 MG capsule Take 40 mg by mouth daily before breakfast.      . gabapentin (NEURONTIN) 300 MG capsule Take 300 mg by mouth 3 (three) times daily.        Marland Kitchen HYDROcodone-acetaminophen (NORCO/VICODIN) 5-325 MG per tablet Take 1 tablet by mouth 2 (two) times daily as needed for pain.      Marland Kitchen levothyroxine (SYNTHROID, LEVOTHROID) 100 MCG tablet Take 100 mcg by mouth daily.        . simvastatin (ZOCOR) 80 MG tablet Take 80 mg by mouth at bedtime.        . nitroGLYCERIN (NITROSTAT) 0.4 MG SL tablet Place 0.4 mg under the tongue every 5 (five) minutes as needed for chest pain.         Blood pressure 150/96, pulse 106, temperature 98.9 F (37.2 C), temperature source Oral, resp. rate 20, height 5\' 7"  (1.702 m), weight 182 lb (82.555 kg), SpO2 96.00%. Physical Exam: General: pleasant, WD, WN Hispanic male who is laying in bed in NAD HEENT: head is normocephalic, atraumatic.  Sclera are noninjected.  PERRL.  Ears and nose without any masses or lesions.  Mouth is pink and moist Heart: regular rhythm, but mildly tachycardic.  Normal s1,s2. No obvious murmurs, gallops, or rubs noted.  Palpable radial and pedal pulses bilaterally Lungs: CTAB, no wheezes, rhonchi, or rales noted.  Respiratory effort nonlabored Abd: soft, tender greatest in epigastrium with some mild tenderness in his LUQ, otherwise NT, mild abdominal distention, +BS, no masses, hernias, or organomegaly MS: all 4 extremities are symmetrical with no cyanosis, clubbing, or edema. Skin: warm and dry with no masses, lesions, or rashes Psych: A&Ox3 with an appropriate affect.    Results for orders placed during the hospital encounter of 05/30/12 (from the past 48 hour(s))  COMPREHENSIVE METABOLIC PANEL     Status:  Abnormal   Collection Time    06/01/12  6:35 AM      Result Value Range   Sodium 138  135 - 145 mEq/L   Potassium 3.6  3.5 - 5.1 mEq/L   Chloride 102  96 - 112 mEq/L   CO2 25  19 - 32 mEq/L   Glucose, Bld 137 (*) 70 - 99 mg/dL   BUN 9  6 - 23 mg/dL   Creatinine, Ser 1.19  0.50 - 1.35 mg/dL   Calcium 8.6  8.4 - 14.7 mg/dL   Total Protein 6.9  6.0 - 8.3 g/dL   Albumin 3.2 (*) 3.5 - 5.2 g/dL   AST 83 (*) 0 - 37 U/L   ALT 274 (*) 0 - 53 U/L   Alkaline Phosphatase 442 (*) 39 - 117 U/L   Total Bilirubin 2.7 (*) 0.3 - 1.2 mg/dL   GFR calc non Af Amer >90  >90 mL/min   GFR calc Af Amer >90  >90 mL/min   Comment:            The eGFR has been calculated     using the CKD EPI equation.     This calculation has not been     validated in all clinical     situations.     eGFR's persistently     <90 mL/min signify     possible Chronic Kidney Disease.  CBC     Status: Abnormal   Collection Time    06/01/12  6:35 AM      Result Value Range   WBC 19.9 (*) 4.0 - 10.5 K/uL   RBC 5.59  4.22 - 5.81 MIL/uL   Hemoglobin 16.2  13.0 - 17.0 g/dL   HCT 82.9  56.2 - 13.0 %   MCV 86.2  78.0 - 100.0 fL   MCH 29.0  26.0 - 34.0 pg   MCHC 33.6  30.0 - 36.0 g/dL   RDW 86.5  78.4 - 69.6 %   Platelets 195  150 - 400 K/uL  BILIRUBIN, DIRECT     Status: Abnormal   Collection Time    06/01/12  6:35 AM      Result Value Range   Bilirubin, Direct 1.3 (*) 0.0 - 0.3 mg/dL  MRSA PCR SCREENING     Status: None   Collection Time    06/01/12  3:00 PM      Result Value Range   MRSA by PCR NEGATIVE  NEGATIVE   Comment:            The GeneXpert MRSA Assay (FDA     approved for NASAL specimens     only), is one component of a  comprehensive MRSA colonization     surveillance program. It is not     intended to diagnose MRSA     infection nor to guide or     monitor treatment for     MRSA infections.  COMPREHENSIVE METABOLIC PANEL     Status: Abnormal   Collection Time    06/02/12  5:45 AM      Result  Value Range   Sodium 141  135 - 145 mEq/L   Potassium 3.5  3.5 - 5.1 mEq/L   Chloride 106  96 - 112 mEq/L   CO2 25  19 - 32 mEq/L   Glucose, Bld 111 (*) 70 - 99 mg/dL   BUN 11  6 - 23 mg/dL   Creatinine, Ser 4.09  0.50 - 1.35 mg/dL   Calcium 8.4  8.4 - 81.1 mg/dL   Total Protein 6.4  6.0 - 8.3 g/dL   Albumin 2.7 (*) 3.5 - 5.2 g/dL   AST 28  0 - 37 U/L   ALT 144 (*) 0 - 53 U/L   Alkaline Phosphatase 273 (*) 39 - 117 U/L   Total Bilirubin 1.5 (*) 0.3 - 1.2 mg/dL   GFR calc non Af Amer >90  >90 mL/min   GFR calc Af Amer >90  >90 mL/min   Comment:            The eGFR has been calculated     using the CKD EPI equation.     This calculation has not been     validated in all clinical     situations.     eGFR's persistently     <90 mL/min signify     possible Chronic Kidney Disease.  LIPASE, BLOOD     Status: Abnormal   Collection Time    06/02/12  5:45 AM      Result Value Range   Lipase 222 (*) 11 - 59 U/L   Mr 3d Recon At Scanner  06/02/2012  *RADIOLOGY REPORT*  Clinical Data:  Pancreatitis and biliary dilatation.  Elevated liver function tests.  MRI ABDOMEN WITHOUT AND WITH CONTRAST (INCLUDING MRCP)  Technique:  Multiplanar multisequence MR imaging of the abdomen was performed both before and after the administration of intravenous contrast. Heavily T2-weighted images of the biliary and pancreatic ducts were obtained, and three-dimensional MRCP images were rendered by post processing.  Contrast: 20 ml Multihance  Comparison:  05/31/2012  Findings:  Despite efforts by the patient and technologist, motion artifact is present on some series of today's examination and could not be totally eliminated.  This reduces diagnostic sensitivity and specificity.  Trace bilateral pleural effusions noted with mesenteric edema and mild perihepatic and perisplenic ascites.  Edema tracks within and around the pancreas, compatible with pancreatitis.  Gallbladder absent.  The common bile duct measures a  maximum of 6 mm on today's examination.  On image 15 of series 3, the portion of the common bile duct adjacent to the pancreatic head is somewhat irregular, and there is an appearance of abrupt truncation of the CBD adjacent to the ampulla on that same image. This abrupt truncation is shown on the T2-weighted images, but on the postcontrast images such as images 66-77 of series 1504, there appears to be a focal stricturing or extrinsic narrowing of the CBD but with a small channel communicating with the ampulla  Abnormal high precontrast T1 signal in the pancreatic body is shown on images 48-68 of series 1500, compatible with hemorrhage. This corresponds  to a lack of enhancement in the pancreatic body highly concerning for pancreatic necrosis, as noted on the prior CT scan.  IMPRESSION: 1.  The common bile duct currently only measures 6 mm in diameter, but there is focal irregular narrowing in the CBD along the pancreatic head, and also focal narrowing in the distal CBD in the vicinity of the ampulla. Loss of the normal conical distal CBD tapering.  T2-weighted images suggest an abrupt truncation of the distal-most CBD as can be seen with obstructing stone, but the postcontrast images seem to demonstrate a small channel connecting through to the ampulla raising the possibility that the distal CBD stenosis may be from stricturing or extrinsic compression related to the underlying pancreatitis.  Differentiation is not helped by the degree of motion artifact. 2.  Pancreatic necrosis of the pancreatic body, with associated hemorrhage in this portion of the pancreas.  3.  Trace bilateral pleural effusions with adjacent atelectasis; mesenteric edema along with perihepatic and perisplenic ascites. Peripancreatic edema pattern with acute pancreatitis.   Original Report Authenticated By: Gaylyn Rong, M.D.    Mr Abd W/wo Cm/mrcp  06/02/2012  *RADIOLOGY REPORT*  Clinical Data:  Pancreatitis and biliary dilatation.   Elevated liver function tests.  MRI ABDOMEN WITHOUT AND WITH CONTRAST (INCLUDING MRCP)  Technique:  Multiplanar multisequence MR imaging of the abdomen was performed both before and after the administration of intravenous contrast. Heavily T2-weighted images of the biliary and pancreatic ducts were obtained, and three-dimensional MRCP images were rendered by post processing.  Contrast: 20 ml Multihance  Comparison:  05/31/2012  Findings:  Despite efforts by the patient and technologist, motion artifact is present on some series of today's examination and could not be totally eliminated.  This reduces diagnostic sensitivity and specificity.  Trace bilateral pleural effusions noted with mesenteric edema and mild perihepatic and perisplenic ascites.  Edema tracks within and around the pancreas, compatible with pancreatitis.  Gallbladder absent.  The common bile duct measures a maximum of 6 mm on today's examination.  On image 15 of series 3, the portion of the common bile duct adjacent to the pancreatic head is somewhat irregular, and there is an appearance of abrupt truncation of the CBD adjacent to the ampulla on that same image. This abrupt truncation is shown on the T2-weighted images, but on the postcontrast images such as images 66-77 of series 1504, there appears to be a focal stricturing or extrinsic narrowing of the CBD but with a small channel communicating with the ampulla  Abnormal high precontrast T1 signal in the pancreatic body is shown on images 48-68 of series 1500, compatible with hemorrhage. This corresponds to a lack of enhancement in the pancreatic body highly concerning for pancreatic necrosis, as noted on the prior CT scan.  IMPRESSION: 1.  The common bile duct currently only measures 6 mm in diameter, but there is focal irregular narrowing in the CBD along the pancreatic head, and also focal narrowing in the distal CBD in the vicinity of the ampulla. Loss of the normal conical distal CBD  tapering.  T2-weighted images suggest an abrupt truncation of the distal-most CBD as can be seen with obstructing stone, but the postcontrast images seem to demonstrate a small channel connecting through to the ampulla raising the possibility that the distal CBD stenosis may be from stricturing or extrinsic compression related to the underlying pancreatitis.  Differentiation is not helped by the degree of motion artifact. 2.  Pancreatic necrosis of the pancreatic body, with associated hemorrhage  in this portion of the pancreas.  3.  Trace bilateral pleural effusions with adjacent atelectasis; mesenteric edema along with perihepatic and perisplenic ascites. Peripancreatic edema pattern with acute pancreatitis.   Original Report Authenticated By: Gaylyn Rong, M.D.        Assessment/Plan 1. Acute pancreatitis with some necrosis Patient Active Problem List  Diagnosis  . Back pain  . High cholesterol  . Stroke  . Fibromyalgia  . Myocardial infarct  . Acute pancreatitis  . CAD (coronary artery disease) of artery bypass graft  . Hypokalemia  . Abnormal LFTs (liver function tests)   Plan: 1. Agree with GI to proceed with placing a postpyloric PANDA tube to start enteral feedings as this is the best way to feed pancreatitis.  He does not have any evidence of acute infection, just inflammation and some necrosis.  There is nothing surgical to do at this time.  Conservative management with IVFs, enteral feeds, and pain control for now.  We will follow along with you.  Jelisa  E 06/02/2012, 3:11 PM Pager: [phone]

## 2012-06-02 NOTE — Progress Notes (Signed)
[facility] Progress Note [place] TEAM 1 - Stepdown/ICU TEAM   Justin [name]:[id number] DOB: Aug 25, 1955 DOA: 05/30/2012 PCP: No primary provider on file.  Brief narrative: 57 y.o. Male with history of cholecystectomy, DJD/chronic back pain, fibromyalgia, s/p previous CVA with minimal deficit, presented to [facility] with severe abdominal pain and vomiting since night of 05/30/12. Patient's wife has a habit of bringing him a milkshake every night.  He was nauseous and after the milkshake, got much worse. Since AM of 05/31/12, had been vomiting, unable to keep anything down. Because the pain was intolerable, he came to the emergency room, where blood work revealed a markedly elevated lipase and abnormal LFTs.   Patient was initially admitted to [facility], and GI consultation was provided by Dr Eula Listen, who recommended MRCP. On patient's request, he was transferred to [facility] 3/22. En route, MD was contacted by [facility] to say that patient was in acute pain, despite iv Fentanyl, was tachypneic, and tachycardic. After discussion with [facility] RN, patient was diverted to SDU.   Assessment/Plan:  Idiopathic Severe pancreatitis with increased LFTs and prominent CBD and intrahepatic ducts There was concern for a stuttering biliary obstruction - abx due to concern for necrotic element to pancreatitis - LFTs improving - Tbil markedly improved - see recs as per GI consult note - aggressive volume resuscitation as per GI orders - must watch electrolytes very closely - high risk for ALI/acute rapid decompensation  SIRS (HR > 90, WBC > 12K)  Due to above - agree with aggressive volume resusciation - UOP has fallen off over last 24hrs - BUN not markedly elevated at admit - Hct remains elevated, which is another indicator that further volume is appropriate - will need to be mindful that dropping UOP could be due to ATN and not lack of volume - if crt climbs, will need to adjust volume - will consider  changing fluid to lactated ringers if SIRS indicators do not improve   Hypokalemia Replace and cont to follow - Mg is acceptable   High cholesterol Trig not elevated to point to likley be the cause of pancreatitis - hold Zocor for now   Fibromyalgia  CAD of artery bypass graft Denies CP   Hypothyroidism Cont home synthroid dose for now - TSH not typically accurate in setting of acute severe illness  Code Status: FULL    Family Communication: spoke w/ pt at bedside Disposition Plan: SDU  Consultants: GI - [facility]   Procedures: none  Antibiotics: Ciprofloxacin 05/31/12>>>  Flagyl 05/31/12  DVT prophylaxis: lovenox  HPI/Subjective: Pt states that his pain is improving.  He feels much better on the morphine PCA.  He asks that we not place a foley, and vows to save urine in urinal.  Denies cp, sob, f/c, or nausea.   Objective: Blood pressure 150/96, pulse 106, temperature 98.9 F (37.2 C), temperature source Oral, resp. rate 24, height 5\' 7"  (1.702 m), weight 82.555 kg (182 lb), SpO2 93.00%.  Intake/Output Summary (Last 24 hours) at 06/02/12 1342 Last data filed at 06/02/12 1200  Gross per 24 hour  Intake 8278.75 ml  Output   1425 ml  Net 6853.75 ml   Exam: General: mild tachypnea at ~22bpm but no severe acute respiratory distress -able to complete full sentences w/o pause Lungs: Clear to auscultation bilaterally without wheezes or crackles Cardiovascular: tachycardic, regular rhythm without murmur gallop or rub normal S1 and S2 Abdomen: tender in epigastrium, protuberent, soft, bowel sounds positive, no rebound, no  ascites, no appreciable mass Extremities: No significant cyanosis, clubbing, or edema bilateral lower extremities  Data Reviewed: Basic Metabolic Panel:  Recent Labs Lab 05/30/12 1855 05/31/12 0512 06/01/12 0635 06/02/12 0545  NA 141 140 138 141  K 3.0* 3.9 3.6 3.5  CL 103 104 102 106  CO2 26 22 25 25   GLUCOSE 184* 157* 137* 111*  BUN 11 7 9 11    CREATININE 1.03 0.71 0.80 0.75  CALCIUM 9.4 9.1 8.6 8.4  MG  --  1.7  --   --    Liver Function Tests:  Recent Labs Lab 05/30/12 1855 05/31/12 0512 06/01/12 0635 06/02/12 0545  AST 191* 318* 83* 28  ALT 287* 431* 274* 144*  ALKPHOS 529* 582* 442* 273*  BILITOT 1.6* 3.1* 2.7* 1.5*  PROT 7.6 8.1 6.9 6.4  ALBUMIN 4.1 4.3 3.2* 2.7*    Recent Labs Lab 05/30/12 1855 06/02/12 0545  LIPASE >3000* 222*   CBC:  Recent Labs Lab 05/30/12 1855 05/31/12 0512 06/01/12 0635  WBC 21.6* 17.9* 19.9*  NEUTROABS 17.4*  --   --   HGB 14.8 17.5* 16.2  HCT 43.3 50.2 48.2  MCV 86.3 85.4 86.2  PLT 246 230 195   Cardiac Enzymes:  Recent Labs Lab 05/30/12 2327  TROPONINI <0.30    Recent Results (from the past 240 hour(s))  MRSA PCR SCREENING     Status: None   Collection Time    06/01/12  3:00 PM      Result Value Range Status   MRSA by PCR NEGATIVE  NEGATIVE Final   Comment:            The GeneXpert MRSA Assay (FDA     approved for NASAL specimens     only), is one component of a     comprehensive MRSA colonization     surveillance program. It is not     intended to diagnose MRSA     infection nor to guide or     monitor treatment for     MRSA infections.     Studies:  Recent x-ray studies have been reviewed in detail by the Attending Physician  Scheduled Meds:  Scheduled Meds: . ciprofloxacin  400 mg Intravenous Q12H  . enoxaparin (LOVENOX) injection  40 mg Subcutaneous Q24H  . levothyroxine  50 mcg Intravenous QAC breakfast  . metoprolol  5 mg Intravenous Q6H  . metronidazole  500 mg Intravenous Q8H  . morphine   Intravenous Q4H  . pantoprazole (PROTONIX) IV  80 mg Intravenous Q12H  . sodium chloride  3 mL Intravenous Q12H   Continuous Infusions: . 0.9 % NaCl with KCl 20 mEq / L 225 mL/hr at 06/02/12 0450    Time spent on care of this patient: 35 mins   [facility] T  [facility] Office  [phone] Pager - Text Page per Loretha Stapler as per  below:  On-Call/Text Page:      [email or web address]      password [email or web address]  If 7PM-7AM, please contact night-coverage [email or web address] Password [email or web address] 06/02/2012, 1:42 PM   LOS: 3 days

## 2012-06-02 NOTE — Consult Note (Signed)
No role of surgery at this point of his disease.  Surgical intervention in acute pancreatitis carries high mortality unless infection documented.  No role for sterile necrosis.  Would need FNA to determine if necrosis infected or not.  Would continue medical treatment for now.

## 2012-06-02 NOTE — Consult Note (Addendum)
Patient seen, examined, and I agree with the above documentation, including the assessment and plan. Patient transferred with moderate to severe necrotizing pancreatitis meeting SIRS criteria (based on HR and WBC count).   MRCP images personally reviewed and I spoke to the radiologist, the distal CBD does taper, but bile seems to enter the duodenum on delayed imaging -- this argues against an impacted stone and fits with declining LFTs today. I expect he has extrinsic compression from pancreatic edema/pancreatitis, though with his history I cannot exclude a post pancreatitis stricture of the CBD. For now I do not think he needs an ERCP He does need aggressive management of acute pancreatitis with aggressive IV hydration, I have ordered a 1 L bolus due to declining urine output.   Continue continuous infusion at 225 mL an hour and watch urine output closely.  He will be started on PCA. Empiric management with antibiotics of pancreatic necrosis,  continue ciprofloxacin and metronidazole (he is penicillin allergic and therefore I have not switched him to imipenem). Blood culture for any fever.  If his condition worsens he would likely require CT-guided aspiration of pancreatic necrosis  I recommended enteral nutrition, which has been proven to improve outcomes, will have a postpyloric feeding tube (I am aware that there is data for NG feeding, but would like a postpyloric tube if available for now) I will asked surgery to consult should his condition worsen and he need operative management In the future once he has recovered, EUS should be considered to examine the distal common bile duct and rule out CBD stricture/retained stone/microlithiasis We will follow

## 2012-06-02 NOTE — Consult Note (Signed)
Referring Provider: [facility] Primary Care Physician:  No primary provider on file. Primary Gastroenterologist:  none  Reason for Consultation:  Acute pancreatitis , severe  HPI: Justin [name] is a 57 y.o. male with history of chronic back pain, asthma, myalgia, history of coronary artery disease status post remote MI. He is status post cholecystectomy a few years ago which was done in [place] . He was admitted to any [facility] hospital on 05/30/2012 with acute abdominal pain which was severe and associated with nausea and vomiting. CT scan of the abdomen and pelvis on 320 showed marked edema of the neck and body of the pancreas a 9 mm common bile duct without evidence of definite stone or stricture. There was question of necrosis at the pancreatic tail and he also had a moderate amount of free fluid. He was seen in consultation by Dr. Jena Gauss who felt that the patient needed an MRCP which was not available at any pan and he was therefore transferred here yesterday. He has been hemodynamically stable continues to have severe pain which is currently poorly controlled He denies any shortness of breath. Has not had any vomiting. MRCP has been done early this morning and is currently pending. Lipid panel done at any pan was unremarkable, patient does have remote history of EtOH abuse but none in the past several years. His initial lipase was greater than 3000 and has decreased to 222 this morning. Total bilirubin has been as high as 3.1 and is down to 1.5 today, alkaline phosphatase and transaminases are also trending down. WBC has been in the 20,000 range, he is being covered with Cipro and Flagyl IV. He has been receiving adequate volume at 225 per hour however has had a decrease in urine output overnight.   Past Medical History  Diagnosis Date  . Back pain   . Hypertension   . Asthma   . Thyroid disease   . High cholesterol   . Stroke   . Fibromyalgia   . Myocardial infarct     Past  Surgical History  Procedure Laterality Date  . Back surgery    . Cholecystectomy      Prior to Admission medications   Medication Sig Start Date End Date Taking? Authorizing Provider  aspirin 325 MG tablet Take 325 mg by mouth daily.     Yes Historical Provider, MD  DULoxetine (CYMBALTA) 60 MG capsule Take 60 mg by mouth daily.     Yes Historical Provider, MD  esomeprazole (NEXIUM) 40 MG capsule Take 40 mg by mouth daily before breakfast.   Yes Historical Provider, MD  gabapentin (NEURONTIN) 300 MG capsule Take 300 mg by mouth 3 (three) times daily.     Yes Historical Provider, MD  HYDROcodone-acetaminophen (NORCO/VICODIN) 5-325 MG per tablet Take 1 tablet by mouth 2 (two) times daily as needed for pain.   Yes Historical Provider, MD  levothyroxine (SYNTHROID, LEVOTHROID) 100 MCG tablet Take 100 mcg by mouth daily.     Yes Historical Provider, MD  simvastatin (ZOCOR) 80 MG tablet Take 80 mg by mouth at bedtime.     Yes Historical Provider, MD  nitroGLYCERIN (NITROSTAT) 0.4 MG SL tablet Place 0.4 mg under the tongue every 5 (five) minutes as needed for chest pain.     Historical Provider, MD    Current Facility-Administered Medications  Medication Dose Route Frequency Provider Last Rate Last Dose  . 0.9 % NaCl with KCl 20 mEq/ L  infusion   Intravenous Continuous Gerrit Friends  Rourk, MD 225 mL/hr at 06/02/12 0450    . bisacodyl (DULCOLAX) suppository 10 mg  10 mg Rectal Daily PRN Vania Rea, MD      . ciprofloxacin (CIPRO) IVPB 400 mg  400 mg Intravenous Q12H Wilson Singer, MD   400 mg at 06/02/12 0356  . diphenhydrAMINE (BENADRYL) injection 12.5 mg  12.5 mg Intravenous Q6H PRN Vania Rea, MD      . enoxaparin (LOVENOX) injection 40 mg  40 mg Subcutaneous Q24H Vania Rea, MD   40 mg at 06/02/12 0900  . fentaNYL (SUBLIMAZE) injection 50-100 mcg  50-100 mcg Intravenous Q1H PRN Christiane Ha, MD   100 mcg at 06/02/12 1610  . hydrALAZINE (APRESOLINE) injection 10 mg  10 mg  Intravenous Q4H PRN Vania Rea, MD      . ketorolac (TORADOL) 15 MG/ML injection 15 mg  15 mg Intravenous Q6H Laveda Norman, MD   15 mg at 06/02/12 [phone]  . levothyroxine (SYNTHROID, LEVOTHROID) injection 50 mcg  50 mcg Intravenous QAC breakfast Vania Rea, MD   50 mcg at 06/01/12 0741  . metoprolol (LOPRESSOR) injection 5 mg  5 mg Intravenous Q6H Laveda Norman, MD   5 mg at 06/02/12 [phone]  . metroNIDAZOLE (FLAGYL) IVPB 500 mg  500 mg Intravenous Q8H Nimish C Gosrani, MD   500 mg at 06/02/12 8119  . naloxone [facility]) injection 0.4 mg  0.4 mg Intravenous PRN Vania Rea, MD       And  . sodium chloride 0.9 % injection 9 mL  9 mL Intravenous PRN Vania Rea, MD      . ondansetron [facility]) injection 4 mg  4 mg Intravenous Q4H PRN Vania Rea, MD   4 mg at 06/01/12 0222  . pantoprazole (PROTONIX) 80 mg in sodium chloride 0.9 % 100 mL IVPB  80 mg Intravenous Q12H Vania Rea, MD   80 mg at 06/01/12 2115  . sodium chloride 0.9 % injection 3 mL  3 mL Intravenous Q12H Vania Rea, MD   3 mL at 05/31/12 0000    Allergies as of 05/30/2012 - Review Complete 05/30/2012  Allergen Reaction Noted  . Amoxicillin  09/18/2010  . Dilaudid (hydromorphone hcl) Swelling 09/18/2010    Family History  Problem Relation Age of Onset  . Diabetes Mother   . Diabetes Brother   . Diabetes Brother   . Cancer Father     History   Social History  . Marital Status: Divorced    Spouse Name: N/A    Number of Children: N/A  . Years of Education: N/A   Occupational History  . Not on file.   Social History Main Topics  . Smoking status: Passive Smoke Exposure - Never Smoker  . Smokeless tobacco: Not on file  . Alcohol Use: Yes     Comment: occasional  . Drug Use: No  . Sexually Active:    Other Topics Concern  . Not on file   Social History Narrative  . No narrative on file    Review of Systems: Pertinent positive and negative review of systems were noted in the above  HPI section.  All other review of systems was otherwise negative. Physical Exam: Vital signs in last 24 hours: Temp:  [98.4 F (36.9 C)-99.7 F (37.6 C)] 99 F (37.2 C) (03/23 0945) Pulse Rate:  [101-126] 106 (03/23 0945) Resp:  [17-29] 22 (03/23 0945) BP: (131-157)/(76-93) 131/86 mmHg (03/23 0945) SpO2:  [91 %-96 %] 95 % (03/23 0945) Weight:  [  176 lb 9.6 oz (80.105 kg)-182 lb (82.555 kg)] 182 lb (82.555 kg) (03/23 0615) Last BM Date: 06/01/12 General:   Alert,  Well-developed, well-nourished,ill appearing,cooperative in NAD-wife at bedside Head:  Normocephalic and atraumatic. Eyes:  Sclera clear, no icterus.   Conjunctiva pink. Ears:  Normal auditory acuity. Nose:  No deformity, discharge,  or lesions. Mouth:  No deformity or lesions.   Neck:  Supple; no masses or thyromegaly. Lungs:  Decreased  BS Left base  Heart: Tachy Regular rate and rhythm; no murmurs, clicks, rubs,  or gallops. Abdomen: Somewhat distended diffusely tender, bowel sounds are present, no palpable mass or hepatosplenomegaly he has rather generalized guarding  Msk:  Symmetrical without gross deformities. . Pulses:  Normal pulses noted. Extremities:  Without clubbing or edema. Neurologic:  Alert and  oriented x4;  grossly normal neurologically. Skin:  Intact without significant lesions or rashes.. Psych:  Alert and cooperative. Normal mood and affect.  Intake/Output from previous day: 03/22 0701 - 03/23 0700 In: 7378.8 [I.V.:6978.8; IV Piggyback:400] Out: 1175 [Urine:1175] Intake/Output this shift: Total I/O In: 450 [I.V.:450] Out: -   Lab Results:  Recent Labs  05/30/12 1855 05/31/12 0512 06/01/12 0635  WBC 21.6* 17.9* 19.9*  HGB 14.8 17.5* 16.2  HCT 43.3 50.2 48.2  PLT 246 230 195   BMET  Recent Labs  05/31/12 0512 06/01/12 0635 06/02/12 0545  NA 140 138 141  K 3.9 3.6 3.5  CL 104 102 106  CO2 22 25 25   GLUCOSE 157* 137* 111*  BUN 7 9 11   CREATININE 0.71 0.80 0.75  CALCIUM 9.1 8.6  8.4   LFT  Recent Labs  06/01/12 0635 06/02/12 0545  PROT 6.9 6.4  ALBUMIN 3.2* 2.7*  AST 83* 28  ALT 274* 144*  ALKPHOS 442* 273*  BILITOT 2.7* 1.5*  BILIDIR 1.3*  --    PT/INR  Recent Labs  05/30/12 2327  LABPROT 12.5  INR 0.94    Studies/Results: US Abdomen Limited Ruq  05/31/2012  *RADIOLOGY REPORT*  Clinical Data: Pancreatitis.  Elevated LFTs.  LIMITED ABDOMINAL ULTRASOUND  Comparison:  05/31/2012 CT.  Findings:  Gallbladder:  Post cholecystectomy.  Common bile duct:  Only the proximal aspect is well delineated secondary to bowel gas with the common bile duct measuring up to 9.3 mm.  Mid to distal common bile duct stone cannot be excluded based on present exam.  Liver:  Dilated intrahepatic biliary ducts.  Free fluid noted.  Limit evaluation of the pancreas.  Please see recent CT report.  IMPRESSION: Post cholecystectomy.  Dilated proximal common bile duct measuring up to 9.3 mm.   Mid to distal common bile duct not visualized secondary to bowel gas, mid to distal common bile duct stone can not be excluded.  Dilated intrahepatic biliary ducts.   Original Report Authenticated By: Lacy Duverney, M.D.     IMPRESSION:  #13   57 year old male with acute pancreatitis with SIRS.etiology is not clear however with dilated CBD need to rule out choledocholithiasis. He has had had significant edema and may also have had bile duct compression secondary to edema . #2 possible necrosis of the pancreatic tail -on original CT 05/30/2012   #3 pain control-currently inadequate #4 decreased urine output-likely due  to third spacing #5  coronary artery disease #6 Fibromyalgia  PLAN: #1 will review MRCP and decide if urgent ERCP is indicated- #2 continue aggressive volume repletion -will order 1 L fluid bolus now , strict I/Os,keep NPO #3 stop Toradol and fentanyl  and start Morphine PCA  (allergic to Dilaudid but has had morphine without difficulty) #4 Will need to start enteral feedings  within next 24 hours  Will follow closely with you   Amy Esterwood  06/02/2012, 10:27 AM

## 2012-06-03 ENCOUNTER — Encounter (HOSPITAL_COMMUNITY): Payer: Self-pay | Admitting: Physician Assistant

## 2012-06-03 LAB — COMPREHENSIVE METABOLIC PANEL
AST: 16 U/L (ref 0–37)
CO2: 25 mEq/L (ref 19–32)
Calcium: 8.3 mg/dL — ABNORMAL LOW (ref 8.4–10.5)
Creatinine, Ser: 0.67 mg/dL (ref 0.50–1.35)
GFR calc non Af Amer: >90 mL/min (ref >90)

## 2012-06-03 LAB — CBC
MCV: 84 fL (ref 78.0–100.0)
Platelets: 148 10*3/uL — ABNORMAL LOW (ref 150–400)
RBC: 4.38 MIL/uL (ref 4.22–5.81)
WBC: 11 10*3/uL — ABNORMAL HIGH (ref 4.0–10.5)

## 2012-06-03 LAB — MAGNESIUM: Magnesium: 1.9 mg/dL (ref 1.5–2.5)

## 2012-06-03 MED ORDER — POTASSIUM CHLORIDE 10 MEQ/100ML IV SOLN
10.0000 meq | INTRAVENOUS | Status: AC
  Administered 2012-06-03 (×3): 10 meq via INTRAVENOUS
  Filled 2012-06-03: qty 300

## 2012-06-03 MED ORDER — HYDRALAZINE HCL 20 MG/ML IJ SOLN
10.0000 mg | Freq: Four times a day (QID) | INTRAMUSCULAR | Status: DC
  Administered 2012-06-03: 17:00:00 via INTRAVENOUS
  Administered 2012-06-03 – 2012-06-04 (×3): 10 mg via INTRAVENOUS
  Administered 2012-06-04 (×2): via INTRAVENOUS
  Administered 2012-06-05 (×3): 10 mg via INTRAVENOUS
  Administered 2012-06-05: 09:00:00 via INTRAVENOUS
  Administered 2012-06-06 – 2012-06-08 (×10): 10 mg via INTRAVENOUS
  Filled 2012-06-03 (×6): qty 0.5
  Filled 2012-06-03: qty 1
  Filled 2012-06-03: qty 0.5
  Filled 2012-06-03: qty 1
  Filled 2012-06-03 (×2): qty 0.5
  Filled 2012-06-03: qty 1
  Filled 2012-06-03 (×5): qty 0.5
  Filled 2012-06-03: qty 1
  Filled 2012-06-03 (×6): qty 0.5

## 2012-06-03 MED ORDER — DEXTROSE 5 % IV SOLN
20.0000 mmol | Freq: Once | INTRAVENOUS | Status: AC
  Administered 2012-06-03: 20 mmol via INTRAVENOUS
  Filled 2012-06-03: qty 6.67

## 2012-06-03 MED ORDER — METOPROLOL TARTRATE 1 MG/ML IV SOLN
10.0000 mg | Freq: Four times a day (QID) | INTRAVENOUS | Status: DC
  Administered 2012-06-03: 10 mg via INTRAVENOUS
  Administered 2012-06-03: 5 mg via INTRAVENOUS
  Administered 2012-06-04 – 2012-06-08 (×18): 10 mg via INTRAVENOUS
  Filled 2012-06-03 (×13): qty 10
  Filled 2012-06-03: qty 5
  Filled 2012-06-03 (×9): qty 10

## 2012-06-03 MED ORDER — PANTOPRAZOLE SODIUM 40 MG IV SOLR
40.0000 mg | INTRAVENOUS | Status: DC
  Administered 2012-06-03 – 2012-06-06 (×4): 40 mg via INTRAVENOUS
  Filled 2012-06-03 (×5): qty 40

## 2012-06-03 NOTE — Progress Notes (Signed)
[facility] Progress Note [place] TEAM 1 - Stepdown/ICU TEAM   JAMARRIUS SALAY AVW:[id number] DOB: April 23, 1955 DOA: 05/30/2012 PCP: No primary provider on file.  Brief narrative: 57 y.o. Male with history of cholecystectomy, DJD/chronic back pain, fibromyalgia, s/p previous CVA with minimal deficit, presented to [facility] with severe abdominal pain and vomiting since night of 05/30/12. Patient's wife has a habit of bringing him a milkshake every night.  He was nauseous and after the milkshake, got much worse. Since AM of 05/31/12, had been vomiting, unable to keep anything down. Because the pain was intolerable, he came to the emergency room, where blood work revealed a markedly elevated lipase and abnormal LFTs.   Patient was initially admitted to [facility], and GI consultation was provided by Dr Eula Listen, who recommended MRCP. On patient's request, he was transferred to [facility] 3/22. En route, MD was contacted by [facility] to say that patient was in acute pain, despite iv Fentanyl, was tachypneic, and tachycardic. After discussion with [facility] RN, patient was diverted to SDU.   Assessment/Plan:  Idiopathic severe pancreatitis with increased LFTs and prominent CBD and intrahepatic ducts There was concern for a stuttering biliary obstruction - abx due to concern for necrotic element to pancreatitis - LFTs essentially normal today - Tbil normalized - see recs as per GI consult note - slow volume resuscitation - must watch electrolytes very closely - high risk for ALI/acute rapid decompensation - will leave decision on feeding tube to GI / Gen Surg  SIRS (HR > 90, WBC > 12K)  Due to above - has responded well to aggressive volume resusciation - UOP responded appropriately - BUN not markedly elevated at admit - Hct approaching goal of 35 - slow volume so as to avoid overload (no longer meets SIRS criteria today)  Hypokalemia Replace and cont to follow - Mg is acceptable   Hypophosphatemia Cont  to replace - recheck in AM  High cholesterol Trig not elevated to point to likley be the cause of pancreatitis - hold Zocor for now   Fibromyalgia  CAD of artery bypass graft Denies CP - clinically stable   Uncontrolled HTN Likely driven by pain and inflammatory state - adjust tx plan - is not on chronic meds at home   Hypothyroidism Cont home synthroid dose for now - TSH not typically accurate in setting of acute severe illness  Code Status: FULL    Family Communication: spoke w/ pt at bedside Disposition Plan: SDU  Consultants: GI - [facility]   Procedures: none  Antibiotics: Ciprofloxacin 05/31/12 >>  Flagyl 05/31/12 >>  DVT prophylaxis: lovenox  HPI/Subjective: Patient feels that his pain in the last well controlled today.  He denies chest pain.  He denies shortness of breath.  He reports that he has been urinating excessively.  He denies fevers chills or vomiting.  Objective: Blood pressure 163/100, pulse 114, temperature 97.6 F (36.4 C), temperature source Oral, resp. rate 20, height 5\' 7"  (1.702 m), weight 86.138 kg (189 lb 14.4 oz), SpO2 96.00%.  Intake/Output Summary (Last 24 hours) at 06/03/12 1322 Last data filed at 06/03/12 1100  Gross per 24 hour  Intake   4450 ml  Output   3625 ml  Net    825 ml   Exam: General: Tachypnea has resolved with no acute respiratory distress appreciable Lungs: Clear to auscultation bilaterally without wheezes or crackles Cardiovascular: tachycardic, regular rhythm without murmur gallop or rub normal S1 and S2 Abdomen: tender in epigastrium, protuberent,  soft, bowel sounds positive, no rebound, no ascites, no appreciable mass Extremities: No significant cyanosis, clubbing, or edema bilateral lower extremities  Data Reviewed: Basic Metabolic Panel:  Recent Labs Lab 05/30/12 1855 05/31/12 0512 06/01/12 0635 06/02/12 0545 06/02/12 1724 06/03/12 0533  NA 141 140 138 141 138 136  K 3.0* 3.9 3.6 3.5 3.5 3.4*  CL  103 104 102 106 107 103  CO2 26 22 25 25 22 25   GLUCOSE 184* 157* 137* 111* 82 82  BUN 11 7 9 11 9 7   CREATININE 1.03 0.71 0.80 0.75 0.71 0.67  CALCIUM 9.4 9.1 8.6 8.4 8.3* 8.3*  MG  --  1.7  --   --   --  1.9  PHOS  --   --   --   --  1.1*  --    Liver Function Tests:  Recent Labs Lab 05/30/12 1855 05/31/12 0512 06/01/12 0635 06/02/12 0545 06/03/12 0533  AST 191* 318* 83* 28 16  ALT 287* 431* 274* 144* 99*  ALKPHOS 529* 582* 442* 273* 225*  BILITOT 1.6* 3.1* 2.7* 1.5* 1.1  PROT 7.6 8.1 6.9 6.4 6.4  ALBUMIN 4.1 4.3 3.2* 2.7* 2.7*    Recent Labs Lab 05/30/12 1855 06/02/12 0545  LIPASE >3000* 222*   CBC:  Recent Labs Lab 05/30/12 1855 05/31/12 0512 06/01/12 0635 06/03/12 0533  WBC 21.6* 17.9* 19.9* 11.0*  NEUTROABS 17.4*  --   --   --   HGB 14.8 17.5* 16.2 12.5*  HCT 43.3 50.2 48.2 36.8*  MCV 86.3 85.4 86.2 84.0  PLT 246 230 195 148*   Cardiac Enzymes:  Recent Labs Lab 05/30/12 2327  TROPONINI <0.30    Recent Results (from the past 240 hour(s))  MRSA PCR SCREENING     Status: None   Collection Time    06/01/12  3:00 PM      Result Value Range Status   MRSA by PCR NEGATIVE  NEGATIVE Final   Comment:            The GeneXpert MRSA Assay (FDA     approved for NASAL specimens     only), is one component of a     comprehensive MRSA colonization     surveillance program. It is not     intended to diagnose MRSA     infection nor to guide or     monitor treatment for     MRSA infections.     Studies:  Recent x-ray studies have been reviewed in detail by the Attending Physician  Scheduled Meds:  Scheduled Meds: . ciprofloxacin  400 mg Intravenous Q12H  . enoxaparin (LOVENOX) injection  40 mg Subcutaneous Q24H  . levothyroxine  50 mcg Intravenous QAC breakfast  . metoprolol  5 mg Intravenous Q6H  . metronidazole  500 mg Intravenous Q8H  . morphine   Intravenous Q4H  . pantoprazole (PROTONIX) IV  40 mg Intravenous Q24H  . sodium chloride  3 mL  Intravenous Q12H   Continuous Infusions: . 0.9 % NaCl with KCl 20 mEq / L 225 mL/hr at 06/03/12 1610    Time spent on care of this patient: 35 mins   [facility] T  [facility] Office  [phone] Pager - Text Page per Loretha Stapler as per below:  On-Call/Text Page:      [email or web address]      password [email or web address]  If 7PM-7AM, please contact night-coverage [email or web address] Password [email or web address] 06/03/2012, 1:22 PM   LOS: 4 days

## 2012-06-03 NOTE — Progress Notes (Deleted)
INITIAL NUTRITION ASSESSMENT  DOCUMENTATION CODES Per approved criteria  -Not Applicable   INTERVENTION: 1. Monitor magnesium, potassium, and phosphorus daily for at least 3 days, MD to replete as needed, as pt is at risk for refeeding syndrome given prolonged NPO status. 2. Once ready to initiate enteral nutrition, recommend initiation of elemental formula Vital AF 1.2 via post-pyloric tube at 20 ml/hr. Advance by 10 ml/hr every 8 hours to goal of 70 ml/hr. Goal rate will provide: 2016 kcal, 126 grams protein, 1362 ml free water, and 100% RDIs. 3. RD to continue to follow nutrition care plan.  NUTRITION DIAGNOSIS: Inadequate oral intake related to inability to eat as evidenced by NPO status.   Goal: Initiate EN with positive tolerance. Intake to meet >90% of estimated nutrition needs.  Monitor:  weight trends, lab trends, I/O's, TF initiation/tolerance, ability to advance diet  Reason for Assessment: MD Consult for TF Recommendations  57 y.o. male  Admitting Dx: acute pancreatitis  ASSESSMENT: Admitted with severe abdominal pain and vomiting x 1 day. Denies alcohol consumption x 5 years. Work-up reveals severe acute pancreatitis. Initially admitted to [facility], however transferred to [facility] as pt was found to have moderate to severe necrotizing pancreatitis meeting SIRS criteria.  Dr. Rhea Belton noted recommendations for post-pyloric enteral feedings. Surgery with recommendations of conservative management, as pt does not have any evidence of acute infection, just inflammation and necrosis; no surgical needs at this time.  Pt reports that he has not had anything to eat since Thursday. He notes that he had a bologna sandwich and a milkshake. Reports weight has been stable for years at 165 lb. Was eating well prior to onset of symptoms. In regards to refeeding syndrome risk, pt is noted to currently have low potassium and phosphorus levels. Magnesium is currently WNL.    Height: Ht Readings  from Last 1 Encounters:  06/01/12 5\' 7"  (1.702 m)    Weight: Wt Readings from Last 1 Encounters:  06/03/12 189 lb 14.4 oz (86.138 kg)  Wt increased likely 2/2 +9 liters fluid balance since admission. Admit weight was 165 lb.  Ideal Body Weight: 148 lb/67.3 kg  % Ideal Body Weight: 128%  Wt Readings from Last 10 Encounters:  06/03/12 189 lb 14.4 oz (86.138 kg)  09/18/10 165 lb (74.844 kg)   Usual Body Weight: 165 lb  % Usual Body Weight: 100% (using admit wt of 165 lb)  BMI:  25.8 (using admit wt of 165 lb)  Estimated Nutritional Needs: Kcal: 2000 - 2300  Protein: at least 112 grams Fluid: 2.0 - 2.4 liters daily  Skin: intact  Diet Order: NPO  EDUCATION NEEDS: -No education needs identified at this time   Intake/Output Summary (Last 24 hours) at 06/03/12 0931 Last data filed at 06/03/12 0746  Gross per 24 hour  Intake   4775 ml  Output   3875 ml  Net    900 ml    Last BM: 3/22  Labs:   Recent Labs Lab 05/30/12 1855 05/31/12 0512  06/02/12 0545 06/02/12 1724 06/03/12 0533  NA 141 140  < > 141 138 136  K 3.0* 3.9  < > 3.5 3.5 3.4*  CL 103 104  < > 106 107 103  CO2 26 22  < > 25 22 25   BUN 11 7  < > 11 9 7   CREATININE 1.03 0.71  < > 0.75 0.71 0.67  CALCIUM 9.4 9.1  < > 8.4 8.3* 8.3*  MG  --  1.7  --   --   --  1.9  PHOS  --   --   --   --  1.1*  --   GLUCOSE 184* 157*  < > 111* 82 82  < > = values in this interval not displayed.  CBG (last 3)  No results found for this basename: GLUCAP,  in the last 72 hours  Scheduled Meds: . ciprofloxacin  400 mg Intravenous Q12H  . enoxaparin (LOVENOX) injection  40 mg Subcutaneous Q24H  . levothyroxine  50 mcg Intravenous QAC breakfast  . metoprolol  5 mg Intravenous Q6H  . metronidazole  500 mg Intravenous Q8H  . morphine   Intravenous Q4H  . pantoprazole (PROTONIX) IV  40 mg Intravenous Q24H  . sodium chloride  3 mL Intravenous Q12H    Continuous Infusions: . 0.9 % NaCl with KCl 20 mEq / L 225 mL/hr  at 06/03/12 0447    Past Medical History  Diagnosis Date  . Back pain   . Hypertension   . Asthma   . Thyroid disease   . High cholesterol   . Stroke   . Fibromyalgia   . Myocardial infarct     Past Surgical History  Procedure Laterality Date  . Back surgery    . Cholecystectomy    . Finger surgery      Jarold Motto MS, RD, LDN Pager: [phone] After-hours pager: [phone]

## 2012-06-03 NOTE — Progress Notes (Signed)
INITIAL NUTRITION ASSESSMENT  DOCUMENTATION CODES Per approved criteria  -Not Applicable   INTERVENTION: 1. Monitor magnesium, potassium, and phosphorus daily for at least 3 days, MD to replete as needed, as pt is at risk for refeeding syndrome given prolonged NPO status. 2. Once ready to initiate enteral nutrition, recommend initiation of elemental formula Vital AF 1.2 via post-pyloric tube at 20 ml/hr. Advance by 10 ml/hr every 8 hours to goal of 70 ml/hr. Goal rate will provide: 2016 kcal, 126 grams protein, 1362 ml free water, and 100% RDIs. 3. RD to continue to follow nutrition care plan.  NUTRITION DIAGNOSIS: Inadequate oral intake related to inability to eat as evidenced by NPO status.   Goal: Initiate EN with positive tolerance. Intake to meet >90% of estimated nutrition needs.  Monitor:  weight trends, lab trends, I/O's, TF initiation/tolerance, ability to advance diet  Reason for Assessment: MD Consult for TF Recommendations  57 y.o. male  Admitting Dx: acute pancreatitis  ASSESSMENT: Admitted with severe abdominal pain and vomiting x 1 day. Denies alcohol consumption x 5 years. Work-up reveals severe acute pancreatitis. 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  Dr. Rhea Belton noted recommendations for post-pyloric enteral feedings. Surgery with recommendations of conservative management, as pt does not have any evidence of acute infection, just inflammation and necrosis; no surgical needs at this time.  Pt reports that he has not had anything to eat since Thursday. He notes that he had a bologna sandwich and a milkshake. Reports weight has been stable for years at 165 lb. Was eating well prior to onset of symptoms. In regards to refeeding syndrome risk, pt is noted to currently have low potassium and phosphorus levels. Magnesium is currently WNL.    Height: Ht Readings  from Last 1 Encounters:  06/01/12 5\' 7"  (1.702 m)    Weight: Wt Readings from Last 1 Encounters:  06/03/12 189 lb 14.4 oz (86.138 kg)  Wt increased likely 2/2 +9 liters fluid balance since admission. Admit weight was 165 lb.  Ideal Body Weight: 148 lb/67.3 kg  % Ideal Body Weight: 128%  Wt Readings from Last 10 Encounters:  06/03/12 189 lb 14.4 oz (86.138 kg)  09/18/10 165 lb (74.844 kg)   Usual Body Weight: 165 lb  % Usual Body Weight: 100% (using admit wt of 165 lb)  BMI:  25.8 (using admit wt of 165 lb)  Estimated Nutritional Needs: Kcal: 2000 - 2300  Protein: at least 112 grams Fluid: 2.0 - 2.4 liters daily  Skin: intact  Diet Order: NPO  EDUCATION NEEDS: -No education needs identified at this time   Intake/Output Summary (Last 24 hours) at 06/03/12 0859 Last data filed at 06/03/12 0746  Gross per 24 hour  Intake   5000 ml  Output   3875 ml  Net   1125 ml    Last BM: 3/22  Labs:   Recent Labs Lab 05/30/12 1855 05/31/12 0512  06/02/12 0545 06/02/12 1724 06/03/12 0533  NA 141 140  < > 141 138 136  K 3.0* 3.9  < > 3.5 3.5 3.4*  CL 103 104  < > 106 107 103  CO2 26 22  < > 25 22 25   BUN 11 7  < > 11 9 7   CREATININE 1.03 0.71  < > 0.75 0.71 0.67  CALCIUM 9.4 9.1  < > 8.4 8.3* 8.3*  MG  --  1.7  --   --   --  1.9  PHOS  --   --   --   --  1.1*  --   GLUCOSE 184* 157*  < > 111* 82 82  < > = values in this interval not displayed.  CBG (last 3)  No results found for this basename: GLUCAP,  in the last 72 hours  Scheduled Meds: . ciprofloxacin  400 mg Intravenous Q12H  . enoxaparin (LOVENOX) injection  40 mg Subcutaneous Q24H  . levothyroxine  50 mcg Intravenous QAC breakfast  . metoprolol  5 mg Intravenous Q6H  . metronidazole  500 mg Intravenous Q8H  . morphine   Intravenous Q4H  . pantoprazole (PROTONIX) IV  80 mg Intravenous Q12H  . sodium chloride  3 mL Intravenous Q12H    Continuous Infusions: . 0.9 % NaCl with KCl 20 mEq / L 225 mL/hr  at 06/03/12 0447    Past Medical History  Diagnosis Date  . Back pain   . Hypertension   . Asthma   . Thyroid disease   . High cholesterol   . Stroke   . Fibromyalgia   . Myocardial infarct     Past Surgical History  Procedure Laterality Date  . Back surgery    . Cholecystectomy    . Finger surgery      Jarold Motto MS, RD, LDN Pager: [phone] After-hours pager: [phone]

## 2012-06-03 NOTE — Progress Notes (Signed)
[facility] Daily Rounding Note 06/03/2012, 8:47 AM  SUBJECTIVE:       Pain across upper abdomen is better.  No stools, no vomiting.  Is NPO.  Urinating a lot. IVF at 225 per hour.   OBJECTIVE:         Vital signs in last 24 hours:    Temp:  [97.5 F (36.4 C)-99 F (37.2 C)] 98.4 F (36.9 C) (03/24 0407) Pulse Rate:  [106-121] 113 (03/24 0407) Resp:  [16-24] 18 (03/24 0818) BP: (131-174)/(86-102) 156/89 mmHg (03/24 0407) SpO2:  [92 %-98 %] 93 % (03/24 0818) Weight:  [86.138 kg (189 lb 14.4 oz)] 86.138 kg (189 lb 14.4 oz) (03/24 0407) Last BM Date: 06/01/12 General: looks non-toxic, a bit diaphoretic.  Slightly uncomfortable   Heart: tachy, regular Chest: clear B.  Slightly dyspneic. Abdomen: distended, tight, tender RUQ.  BS hypoactive.  Extremities: no CCE Neuro/Psych:  Pleasant, alert and not somnolent.   Intake/Output from previous day: 03/23 0701 - 03/24 0700 In: 5225 [I.V.:3825; IV Piggyback:1400] Out: 3575 [Urine:3575]  Intake/Output this shift: Total I/O In: -  Out: 300 [Urine:300]  Lab Results:  Recent Labs  06/01/12 0635 06/03/12 0533  WBC 19.9* 11.0*  HGB 16.2 12.5*  HCT 48.2 36.8*  PLT 195 148*   BMET  Recent Labs  06/02/12 0545 06/02/12 1724 06/03/12 0533  NA 141 138 136  K 3.5 3.5 3.4*  CL 106 107 103  CO2 25 22 25   GLUCOSE 111* 82 82  BUN 11 9 7   CREATININE 0.75 0.71 0.67  CALCIUM 8.4 8.3* 8.3*   LFT  Recent Labs  06/01/12 0635 06/02/12 0545 06/03/12 0533  PROT 6.9 6.4 6.4  ALBUMIN 3.2* 2.7* 2.7*  AST 83* 28 16  ALT 274* 144* 99*  ALKPHOS 442* 273* 225*  BILITOT 2.7* 1.5* 1.1  BILIDIR 1.3*  --   --     Studies/Results: Mr 3d Recon At [employer] W/wo Cm/mrcp 06/02/2012  IMPRESSION: 1.  The common bile duct currently only measures 6 mm in diameter, but there is focal irregular narrowing in the CBD along the pancreatic head, and also focal narrowing in the distal CBD in the vicinity of the ampulla. Loss of the normal  conical distal CBD tapering.  T2-weighted images suggest an abrupt truncation of the distal-most CBD as can be seen with obstructing stone, but the postcontrast images seem to demonstrate a small channel connecting through to the ampulla raising the possibility that the distal CBD stenosis may be from stricturing or extrinsic compression related to the underlying pancreatitis.  Differentiation is not helped by the degree of motion artifact. 2.  Pancreatic necrosis of the pancreatic body, with associated hemorrhage in this portion of the pancreas.  3.  Trace bilateral pleural effusions with adjacent atelectasis; mesenteric edema along with perihepatic and perisplenic ascites. Peripancreatic edema pattern with acute pancreatitis.   Original Report Authenticated By: Gaylyn Rong, M.D.     ASSESMENT: *  Acute on chronic, recurrent pancreatitis. ? Intrinsic vs extrinsic CBD narrowing.  Pancreatic necrosis.  GB removed 2009 in [place]. . Surgery does not see a role in setting of sterile necrosis.  On empiric Cipro and Flagyl.  Agressively hydrating. Excellent urine output.  LFTs and WBCs improved. Renal function not compromised.  *  SIRS, seems improved with decreased WBCs but still tachycardic   PLAN: *  Supportive care.  ? Allow clears?  ? Place post pyloric feeding tube and begin enteric feeding? *  ?  Decrease IVF to 175?    LOS: 4 days   Jennye Moccasin  06/03/2012, 8:47 AM Pager: [phone]  GI ATTENDING  Patient's case reviewed in morning report with colleagues. Patient seen and examined. Laboratories and x-rays reviewed. Patient with recurrent pancreatitis of uncertain etiology, question biliary origin. Status post previous cholecystectomy. Evidence of necrosis on imaging. Likely stable overnight. Laboratories improving. Additional imaging does not show evidence for ductal obstruction or retained common duct stones. Continue with supportive care. Advance diet if tolerated. Agree with decreasing  IV fluids tomorrow. We'll follow.  Wilhemina Bonito. Eda Keys., M.D. [facility] Division of Gastroenterology

## 2012-06-03 NOTE — Progress Notes (Signed)
Subjective: Less LUQ pain, no N/V  Objective: Vital signs in last 24 hours: Temp:  [97.5 F (36.4 C)-99 F (37.2 C)] 98.4 F (36.9 C) (03/24 0407) Pulse Rate:  [106-121] 113 (03/24 0407) Resp:  [16-24] 18 (03/24 0818) BP: (131-174)/(86-102) 156/89 mmHg (03/24 0407) SpO2:  [92 %-98 %] 93 % (03/24 0818) Weight:  [86.138 kg (189 lb 14.4 oz)] 86.138 kg (189 lb 14.4 oz) (03/24 0407) Last BM Date: 06/01/12  Intake/Output from previous day: 03/23 0701 - 03/24 0700 In: 5225 [I.V.:3825; IV Piggyback:1400] Out: 3575 [Urine:3575] Intake/Output this shift: Total I/O In: -  Out: 300 [Urine:300]  General appearance: alert and cooperative Resp: clear to auscultation bilaterally Cardio: S1, S2 normal GI: soft, minimal LUQ tenderness, few BS, mild distention Neuro: MAE, A&O  Lab Results:   Recent Labs  06/01/12 0635 06/03/12 0533  WBC 19.9* 11.0*  HGB 16.2 12.5*  HCT 48.2 36.8*  PLT 195 148*   BMET  Recent Labs  06/02/12 1724 06/03/12 0533  NA 138 136  K 3.5 3.4*  CL 107 103  CO2 22 25  GLUCOSE 82 82  BUN 9 7  CREATININE 0.71 0.67  CALCIUM 8.3* 8.3*   PT/INR No results found for this basename: LABPROT, INR,  in the last 72 hours ABG  Recent Labs  05/31/12 1055  PHART 7.357  HCO3 22.2    Studies/Results: Mr 3d Recon At [employer]  06/02/2012  *RADIOLOGY REPORT*  Clinical Data:  Pancreatitis and biliary dilatation.  Elevated liver function tests.  MRI ABDOMEN WITHOUT AND WITH CONTRAST (INCLUDING MRCP)  Technique:  Multiplanar multisequence MR imaging of the abdomen was performed both before and after the administration of intravenous contrast. Heavily T2-weighted images of the biliary and pancreatic ducts were obtained, and three-dimensional MRCP images were rendered by post processing.  Contrast: 20 ml Multihance  Comparison:  05/31/2012  Findings:  Despite efforts by the patient and technologist, motion artifact is present on some series of today's examination  and could not be totally eliminated.  This reduces diagnostic sensitivity and specificity.  Trace bilateral pleural effusions noted with mesenteric edema and mild perihepatic and perisplenic ascites.  Edema tracks within and around the pancreas, compatible with pancreatitis.  Gallbladder absent.  The common bile duct measures a maximum of 6 mm on today's examination.  On image 15 of series 3, the portion of the common bile duct adjacent to the pancreatic head is somewhat irregular, and there is an appearance of abrupt truncation of the CBD adjacent to the ampulla on that same image. This abrupt truncation is shown on the T2-weighted images, but on the postcontrast images such as images 66-77 of series 1504, there appears to be a focal stricturing or extrinsic narrowing of the CBD but with a small channel communicating with the ampulla  Abnormal high precontrast T1 signal in the pancreatic body is shown on images 48-68 of series 1500, compatible with hemorrhage. This corresponds to a lack of enhancement in the pancreatic body highly concerning for pancreatic necrosis, as noted on the prior CT scan.  IMPRESSION: 1.  The common bile duct currently only measures 6 mm in diameter, but there is focal irregular narrowing in the CBD along the pancreatic head, and also focal narrowing in the distal CBD in the vicinity of the ampulla. Loss of the normal conical distal CBD tapering.  T2-weighted images suggest an abrupt truncation of the distal-most CBD as can be seen with obstructing stone, but the postcontrast images seem to demonstrate  a small channel connecting through to the ampulla raising the possibility that the distal CBD stenosis may be from stricturing or extrinsic compression related to the underlying pancreatitis.  Differentiation is not helped by the degree of motion artifact. 2.  Pancreatic necrosis of the pancreatic body, with associated hemorrhage in this portion of the pancreas.  3.  Trace bilateral pleural  effusions with adjacent atelectasis; mesenteric edema along with perihepatic and perisplenic ascites. Peripancreatic edema pattern with acute pancreatitis.   Original Report Authenticated By: Gaylyn Rong, M.D.    Mr Abd W/wo Cm/mrcp  06/02/2012  *RADIOLOGY REPORT*  Clinical Data:  Pancreatitis and biliary dilatation.  Elevated liver function tests.  MRI ABDOMEN WITHOUT AND WITH CONTRAST (INCLUDING MRCP)  Technique:  Multiplanar multisequence MR imaging of the abdomen was performed both before and after the administration of intravenous contrast. Heavily T2-weighted images of the biliary and pancreatic ducts were obtained, and three-dimensional MRCP images were rendered by post processing.  Contrast: 20 ml Multihance  Comparison:  05/31/2012  Findings:  Despite efforts by the patient and technologist, motion artifact is present on some series of today's examination and could not be totally eliminated.  This reduces diagnostic sensitivity and specificity.  Trace bilateral pleural effusions noted with mesenteric edema and mild perihepatic and perisplenic ascites.  Edema tracks within and around the pancreas, compatible with pancreatitis.  Gallbladder absent.  The common bile duct measures a maximum of 6 mm on today's examination.  On image 15 of series 3, the portion of the common bile duct adjacent to the pancreatic head is somewhat irregular, and there is an appearance of abrupt truncation of the CBD adjacent to the ampulla on that same image. This abrupt truncation is shown on the T2-weighted images, but on the postcontrast images such as images 66-77 of series 1504, there appears to be a focal stricturing or extrinsic narrowing of the CBD but with a small channel communicating with the ampulla  Abnormal high precontrast T1 signal in the pancreatic body is shown on images 48-68 of series 1500, compatible with hemorrhage. This corresponds to a lack of enhancement in the pancreatic body highly concerning for  pancreatic necrosis, as noted on the prior CT scan.  IMPRESSION: 1.  The common bile duct currently only measures 6 mm in diameter, but there is focal irregular narrowing in the CBD along the pancreatic head, and also focal narrowing in the distal CBD in the vicinity of the ampulla. Loss of the normal conical distal CBD tapering.  T2-weighted images suggest an abrupt truncation of the distal-most CBD as can be seen with obstructing stone, but the postcontrast images seem to demonstrate a small channel connecting through to the ampulla raising the possibility that the distal CBD stenosis may be from stricturing or extrinsic compression related to the underlying pancreatitis.  Differentiation is not helped by the degree of motion artifact. 2.  Pancreatic necrosis of the pancreatic body, with associated hemorrhage in this portion of the pancreas.  3.  Trace bilateral pleural effusions with adjacent atelectasis; mesenteric edema along with perihepatic and perisplenic ascites. Peripancreatic edema pattern with acute pancreatitis.   Original Report Authenticated By: Gaylyn Rong, M.D.     Anti-infectives: Anti-infectives   Start     Dose/Rate Route Frequency Ordered Stop   05/31/12 1400  ciprofloxacin (CIPRO) IVPB 400 mg     400 mg 200 mL/hr over 60 Minutes Intravenous Every 12 hours 05/31/12 1323     05/31/12 1400  metroNIDAZOLE (FLAGYL) IVPB 500 mg  500 mg 100 mL/hr over 60 Minutes Intravenous Every 8 hours 05/31/12 1323        Assessment/Plan: Acute necrotizing pancreatitis - NPO except ice, for post-pyloric panda today for TF, ABX as below, LFTs improving, appreciate GI eval ID - Cipro/Flagyl as PCN allergy SIRS - improved with resuscitation We will follow  LOS: 4 days    Justin [name] 06/03/2012

## 2012-06-04 ENCOUNTER — Inpatient Hospital Stay (HOSPITAL_COMMUNITY): Payer: Medicare Other

## 2012-06-04 DIAGNOSIS — R109 Unspecified abdominal pain: Secondary | ICD-10-CM

## 2012-06-04 LAB — CBC
Hemoglobin: 14 g/dL (ref 13.0–17.0)
Platelets: 193 10*3/uL (ref 150–400)
RBC: 4.76 MIL/uL (ref 4.22–5.81)
WBC: 13.3 10*3/uL — ABNORMAL HIGH (ref 4.0–10.5)

## 2012-06-04 LAB — COMPREHENSIVE METABOLIC PANEL
Albumin: 2.8 g/dL — ABNORMAL LOW (ref 3.5–5.2)
BUN: 7 mg/dL (ref 6–23)
CO2: 25 mEq/L (ref 19–32)
Calcium: 8.7 mg/dL (ref 8.4–10.5)
Chloride: 101 mEq/L (ref 96–112)
Creatinine, Ser: 0.73 mg/dL (ref 0.50–1.35)
GFR calc non Af Amer: >90 mL/min (ref >90)
Total Bilirubin: 0.9 mg/dL (ref 0.3–1.2)

## 2012-06-04 LAB — MAGNESIUM: Magnesium: 2 mg/dL (ref 1.5–2.5)

## 2012-06-04 MED ORDER — IOHEXOL 300 MG/ML  SOLN
25.0000 mL | Freq: Once | INTRAMUSCULAR | Status: AC | PRN
  Administered 2012-06-04: 20 mL

## 2012-06-04 MED ORDER — POTASSIUM PHOSPHATE DIBASIC 3 MMOLE/ML IV SOLN
20.0000 mmol | Freq: Once | INTRAVENOUS | Status: AC
  Administered 2012-06-04: 20 mmol via INTRAVENOUS
  Filled 2012-06-04: qty 6.67

## 2012-06-04 MED ORDER — VITAL AF 1.2 CAL PO LIQD
1000.0000 mL | ORAL | Status: DC
  Administered 2012-06-04 – 2012-06-11 (×6): 1000 mL
  Filled 2012-06-04 (×9): qty 1000

## 2012-06-04 NOTE — Progress Notes (Signed)
     [facility] Daily Rounding Note 06/04/2012, 8:31 AM  SUBJECTIVE:       Post pyloric feeding tube placed earlier today.  TF not yet started .  He is hungry.  Pain in upper and right abdomen is overall better but still using the PCA meds.  No BM since last Thursday.  Has not had po's since then either.  Not SOB but still tachy.  OBJECTIVE:         Vital signs in last 24 hours:    Temp:  [97.6 F (36.4 C)-98.9 F (37.2 C)] 98.9 F (37.2 C) (03/25 0419) Pulse Rate:  [100-124] 120 (03/25 0419) Resp:  [16-25] 17 (03/25 0419) BP: (122-163)/(76-100) 122/90 mmHg (03/25 0419) SpO2:  [95 %-100 %] 95 % (03/25 0419) Weight:  [84.2 kg (185 lb 10 oz)] 84.2 kg (185 lb 10 oz) (03/25 0419) Last BM Date: 06/01/12 General: looks unwell, not toxic.    Heart: Tachy, regular Chest: crackles B in bases.  No cough.  Abdomen: soft, protuberant.  Minor tenderness in right side.  No mass  Extremities: no pedal edema.  Feet warm Neuro/Psych:  Pleasant. Cooperative.  Not somnolent. MS:  Difficulty sitting up in bed   Intake/Output from previous day: 03/24 0701 - 03/25 0700 In: 3050 [I.V.:2250; IV Piggyback:800] Out: 3400 [Urine:3400]  Intake/Output this shift:    Lab Results:  Recent Labs  06/03/12 0533 06/04/12 0450  WBC 11.0* 13.3*  HGB 12.5* 14.0  HCT 36.8* 39.0  PLT 148* 193   BMET  Recent Labs  06/02/12 1724 06/03/12 0533 06/04/12 0450  NA 138 136 135  K 3.5 3.4* 3.7  CL 107 103 101  CO2 22 25 25   GLUCOSE 82 82 88  BUN 9 7 7   CREATININE 0.71 0.67 0.73  CALCIUM 8.3* 8.3* 8.7   LFT  Recent Labs  06/02/12 0545 06/03/12 0533 06/04/12 0450  PROT 6.4 6.4 6.8  ALBUMIN 2.7* 2.7* 2.8*  AST 28 16 13   ALT 144* 99* 76*  ALKPHOS 273* 225* 206*  BILITOT 1.5* 1.1 0.9    ASSESMENT: * Acute on chronic, recurrent pancreatitis. ? Intrinsic vs extrinsic CBD narrowing. 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 .  Surgery does not see a role in setting of sterile necrosis.  On empiric Cipro and Flagyl. Agressively hydrating. Excellent urine output.  LFTs and WBCs improved. Renal function not compromised.  * SIRS, seems improved with decreased WBCs but still tachycardic   PLAN: *  Supportive care which now includes tube feedings, cipro/flagyl, IVF at 150 cc/hour.  *  needs to mobilize even with all the attached apparatus, so suggest PT eval.    LOS: 5 days   Jennye Moccasin  06/04/2012, 8:31 AM Pager: [phone]  GI ATTENDING  Patient personally seen and examined. Wife in room. Interval clinical history and laboratories reviewed. Patient remains stable. No new problems. Still with moderate abdominal discomfort. Laboratories okay. Agree with decreasing IV fluids to current rate. Okay to continue antibiotics for now. Plans for nasogastric feeding noted. Agree. Also, most important to increase his activity level. Out of bed to chair. Agree with physical therapy assistance. Incentive spirometer would be a good idea as well. We'll follow.  Wilhemina Bonito. Eda Keys., M.D. [facility] Division of Gastroenterology

## 2012-06-04 NOTE — Progress Notes (Signed)
[facility] Progress Note [place] TEAM 1 - Stepdown/ICU TEAM   Justin [name] ZOX:[id number] DOB: [birth year] DOA: 05/30/2012 PCP: No primary provider on file.  Brief narrative: 57 y.o. Male with history of cholecystectomy, DJD/chronic back pain, fibromyalgia, s/p previous CVA with minimal deficit, presented to [facility] with severe abdominal pain and vomiting since night of 05/30/12. Patient's wife has a habit of bringing him a milkshake every night.  He was nauseous and after the milkshake, got much worse. Since AM of 05/31/12, had been vomiting, unable to keep anything down. Because the pain was intolerable, he came to the emergency room, where blood work revealed a markedly elevated lipase and abnormal LFTs.   Patient was initially admitted to [facility], and GI consultation was provided by Dr Eula Listen, who recommended MRCP. On patient's request, he was transferred to [facility] 3/22. En route, MD was contacted by [facility] to say that patient was in acute pain, despite iv Fentanyl, was tachypneic, and tachycardic. After discussion with [facility] RN, patient was diverted to SDU.   Assessment/Plan:  Idiopathic severe pancreatitis with increased LFTs and prominent CBD and intrahepatic ducts There was concern for a stuttering biliary obstruction - abx due to concern for necrotic element to pancreatitis - LFTs essentially normal today - Tbil normalized - see recs as per GI consult note - slow volume resuscitation - must watch electrolytes very closely - high risk for ALI/acute rapid decompensation - NG tube placed today with initiation of postpyloric tube feeds  SIRS (HR > 90, WBC > 12K)  Due to above - has responded well to aggressive volume resusciation - UOP responded appropriately - BUN not markedly elevated at admit - Hct approaching goal of 35 - will decrease IV fluids to 100 cc per hour as he will now begin tube feeds  Hypokalemia Replaced - cont to follow - Mg is acceptable    Hypophosphatemia Cont to replace - recheck in AM-monitor closely with initiation of tube feeds  High cholesterol Trig not elevated to point to likley be the cause of pancreatitis - hold Zocor for now   Fibromyalgia  CAD of artery bypass graft Denies CP - clinically stable   Uncontrolled HTN Likely driven by pain and inflammatory state - is not on chronic meds at home-no changes made in medications today  Hypothyroidism Cont home synthroid dose for now - TSH not typically accurate in setting of acute severe illness  Code Status: FULL    Family Communication: spoke w/ pt and girlfriend at bedside Disposition Plan: SDU  Consultants: GI - [facility]   Procedures: none  Antibiotics: Ciprofloxacin 05/31/12 >>  Flagyl 05/31/12 >>  DVT prophylaxis: lovenox  HPI/Subjective: Patient states pain is currently about 7/10 but tolerable. Admits to being quite weak and hungry and is concerned about tube feeds being started. He and his girlfriend have been explained that feeding will be started at a slow rate and progressed slowly and carefully to ensure that it is appropriately tolerated.  Objective: Blood pressure 145/96, pulse 103, temperature 98.6 F (37 C), temperature source Oral, resp. rate 17, height 5\' 7"  (1.702 m), weight 84.2 kg (185 lb 10 oz), SpO2 95.00%.  Intake/Output Summary (Last 24 hours) at 06/04/12 1810 Last data filed at 06/04/12 1400  Gross per 24 hour  Intake   3350 ml  Output   1500 ml  Net   1850 ml   Exam: General: No acute distress Lungs: Clear to auscultation bilaterally without wheezes or crackles Cardiovascular:  Mild tachycardic, regular rhythm without murmur gallop or rub normal S1 and S2 Abdomen: tender in epigastrium, protuberent, soft, bowel sounds positive, no rebound, no ascites, no appreciable mass Extremities: No significant cyanosis, clubbing, or edema bilateral lower extremities  Data Reviewed: Basic Metabolic Panel:  Recent  Labs Lab 05/30/12 1855 05/31/12 0512 06/01/12 0635 06/02/12 0545 06/02/12 1724 06/03/12 0533 06/04/12 0450  NA 141 140 138 141 138 136 135  K 3.0* 3.9 3.6 3.5 3.5 3.4* 3.7  CL 103 104 102 106 107 103 101  CO2 26 22 25 25 22 25 25   GLUCOSE 184* 157* 137* 111* 82 82 88  BUN 11 7 9 11 9 7 7   CREATININE 1.03 0.71 0.80 0.75 0.71 0.67 0.73  CALCIUM 9.4 9.1 8.6 8.4 8.3* 8.3* 8.7  MG  --  1.7  --   --   --  1.9 2.0  PHOS  --   --   --   --  1.1*  --  1.8*   Liver Function Tests:  Recent Labs Lab 05/31/12 0512 06/01/12 0635 06/02/12 0545 06/03/12 0533 06/04/12 0450  AST 318* 83* 28 16 13   ALT 431* 274* 144* 99* 76*  ALKPHOS 582* 442* 273* 225* 206*  BILITOT 3.1* 2.7* 1.5* 1.1 0.9  PROT 8.1 6.9 6.4 6.4 6.8  ALBUMIN 4.3 3.2* 2.7* 2.7* 2.8*    Recent Labs Lab 05/30/12 1855 06/02/12 0545  LIPASE >3000* 222*   CBC:  Recent Labs Lab 05/30/12 1855 05/31/12 0512 06/01/12 0635 06/03/12 0533 06/04/12 0450  WBC 21.6* 17.9* 19.9* 11.0* 13.3*  NEUTROABS 17.4*  --   --   --   --   HGB 14.8 17.5* 16.2 12.5* 14.0  HCT 43.3 50.2 48.2 36.8* 39.0  MCV 86.3 85.4 86.2 84.0 81.9  PLT 246 230 195 148* 193   Cardiac Enzymes:  Recent Labs Lab 05/30/12 2327  TROPONINI <0.30    Recent Results (from the past 240 hour(s))  MRSA PCR SCREENING     Status: None   Collection Time    06/01/12  3:00 PM      Result Value Range Status   MRSA by PCR NEGATIVE  NEGATIVE Final   Comment:            The GeneXpert MRSA Assay (FDA     approved for NASAL specimens     only), is one component of a     comprehensive MRSA colonization     surveillance program. It is not     intended to diagnose MRSA     infection nor to guide or     monitor treatment for     MRSA infections.     Studies:  Recent x-ray studies have been reviewed in detail by the Attending Physician  Scheduled Meds:  Scheduled Meds: . ciprofloxacin  400 mg Intravenous Q12H  . enoxaparin (LOVENOX) injection  40 mg  Subcutaneous Q24H  . hydrALAZINE  10 mg Intravenous Q6H  . levothyroxine  50 mcg Intravenous QAC breakfast  . metoprolol  10 mg Intravenous Q6H  . metronidazole  500 mg Intravenous Q8H  . morphine   Intravenous Q4H  . pantoprazole (PROTONIX) IV  40 mg Intravenous Q24H  . potassium phosphate IVPB (mmol)  20 mmol Intravenous Once  . sodium chloride  3 mL Intravenous Q12H   Continuous Infusions: . 0.9 % NaCl with KCl 20 mEq / L 150 mL/hr at 06/04/12 1756  . feeding supplement (VITAL AF 1.2 CAL) 1,000 mL (  06/04/12 1526)    Time spent on care of this patient: 35 mins   [facility]  [facility] Office  [phone] Pager - Text Page per Loretha Stapler as per below:  On-Call/Text Page:      [email or web address]      password [email or web address]  If 7PM-7AM, please contact night-coverage [email or web address] Password [email or web address] 06/04/2012, 6:10 PM   LOS: 5 days

## 2012-06-04 NOTE — Progress Notes (Signed)
Pharmacy Consult for potassium replacement  Justin [name] is a 57 yo M with recurrent pancreatitis.  Pharmacy has been asked to assist with K replacement.  He is currently NPO x ice chips.  His k is up to 3.7 from 3.4 after 30 IV K and 20 mMol Kphos yesterday.  Mag 2. Phos 1.8  Plan: Kphos to provide ~ 30 meq K and 20 mMol phos.  K/phos/Mg have already been ordered for am by MD.  Thanks Herby Abraham, Pharm.D. [phone] 06/04/2012 2:24 PM

## 2012-06-04 NOTE — Progress Notes (Signed)
Patient ID: Justin [name], male   DOB: 11-Jan-1956, 57 y.o.   MRN: [id number]    Subjective: Pt feels better, pain less.  Just got PANDA tube  Objective: Vital signs in last 24 hours: Temp:  [97.6 F (36.4 C)-98.9 F (37.2 C)] 98.9 F (37.2 C) (03/25 0419) Pulse Rate:  [111-124] 120 (03/25 0419) Resp:  [17-25] 17 (03/25 0419) BP: (122-183)/(76-110) 183/110 mmHg (03/25 0916) SpO2:  [95 %-100 %] 95 % (03/25 0419) Weight:  [185 lb 10 oz (84.2 kg)] 185 lb 10 oz (84.2 kg) (03/25 0419) Last BM Date: 06/01/12  Intake/Output from previous day: 03/24 0701 - 03/25 0700 In: 3200 [I.V.:2400; IV Piggyback:800] Out: 3400 [Urine:3400] Intake/Output this shift: Total I/O In: 450 [I.V.:450] Out: -   PE: Abd: soft, still bloated, few BS, less tender, PANDA in place  Lab Results:   Recent Labs  06/03/12 0533 06/04/12 0450  WBC 11.0* 13.3*  HGB 12.5* 14.0  HCT 36.8* 39.0  PLT 148* 193   BMET  Recent Labs  06/03/12 0533 06/04/12 0450  NA 136 135  K 3.4* 3.7  CL 103 101  CO2 25 25  GLUCOSE 82 88  BUN 7 7  CREATININE 0.67 0.73  CALCIUM 8.3* 8.7   PT/INR No results found for this basename: LABPROT, INR,  in the last 72 hours CMP     Component Value Date/Time   NA 135 06/04/2012 0450   K 3.7 06/04/2012 0450   CL 101 06/04/2012 0450   CO2 25 06/04/2012 0450   GLUCOSE 88 06/04/2012 0450   BUN 7 06/04/2012 0450   CREATININE 0.73 06/04/2012 0450   CALCIUM 8.7 06/04/2012 0450   PROT 6.8 06/04/2012 0450   ALBUMIN 2.8* 06/04/2012 0450   AST 13 06/04/2012 0450   ALT 76* 06/04/2012 0450   ALKPHOS 206* 06/04/2012 0450   BILITOT 0.9 06/04/2012 0450   GFRNONAA >90 06/04/2012 0450   GFRAA >90 06/04/2012 0450   Lipase     Component Value Date/Time   LIPASE 222* 06/02/2012 0545       Studies/Results: Dg Abd 1 View  06/04/2012  *RADIOLOGY REPORT*  Clinical Data: Feeding tube placement  ABDOMEN - 1 VIEW  Comparison: None.  Fluoroscopy time: 18 seconds  Findings: Weighted feeding tube has  been placed in the proximal duodenum just past the ligament of Treitz.  IMPRESSION: Weighted feeding tube terminates in the proximal duodenum.   Original Report Authenticated By: Charline Bills, M.D.     Anti-infectives: Anti-infectives   Start     Dose/Rate Route Frequency Ordered Stop   05/31/12 1400  ciprofloxacin (CIPRO) IVPB 400 mg     400 mg 200 mL/hr over 60 Minutes Intravenous Every 12 hours 05/31/12 1323     05/31/12 1400  metroNIDAZOLE (FLAGYL) IVPB 500 mg     500 mg 100 mL/hr over 60 Minutes Intravenous Every 8 hours 05/31/12 1323         Assessment/Plan  1. Pancreatitis, some necrosis seen on MRI  Plan: 1. TFs per nutrition.   2. Pancreatitis seems to be improving some.  No need for surgical intervention at this time.   LOS: 5 days    Zac Torti E 06/04/2012, 11:25 AM Pager: [phone]

## 2012-06-04 NOTE — Progress Notes (Signed)
Patient examined and I agree with the assessment and plan  Violeta Gelinas, MD, MPH, FACS Pager: [phone]  06/04/2012 3:34 PM

## 2012-06-05 LAB — BASIC METABOLIC PANEL
GFR calc Af Amer: >90 mL/min (ref >90)
GFR calc non Af Amer: >90 mL/min (ref >90)
Potassium: 3.6 mEq/L (ref 3.5–5.1)
Sodium: 133 mEq/L — ABNORMAL LOW (ref 135–145)

## 2012-06-05 LAB — PHOSPHORUS
Phosphorus: 2.5 mg/dL (ref 2.3–4.6)
Phosphorus: 2.5 mg/dL (ref 2.3–4.6)

## 2012-06-05 LAB — CBC
MCV: 83.2 fL (ref 78.0–100.0)
Platelets: 244 10*3/uL (ref 150–400)
RDW: 14.8 % (ref 11.5–15.5)
WBC: 15.8 10*3/uL — ABNORMAL HIGH (ref 4.0–10.5)

## 2012-06-05 LAB — MAGNESIUM: Magnesium: 2 mg/dL (ref 1.5–2.5)

## 2012-06-05 MED ORDER — GI COCKTAIL ~~LOC~~
30.0000 mL | Freq: Once | ORAL | Status: AC
  Administered 2012-06-06: 30 mL via ORAL
  Filled 2012-06-05: qty 30

## 2012-06-05 NOTE — Progress Notes (Signed)
NUTRITION FOLLOW UP  Intervention:    Continue advancement of Vital AF 1.2 to goal of 70 ml/hr. Goal rate will provide: 2016 kcal, 126 grams protein, 1362 ml free water, and 100% RDIs. Continue to monitor magnesium, potassium, and phosphorus daily for at least 3 days, MD to replete as needed, as pt is at risk for refeeding syndrome given prolonged NPO status. RD to continue to follow nutrition care plan  Nutrition Dx:   Inadequate oral intake related to inability to eat as evidenced by NPO status. Ongoing.  Goal:   Initiate EN with positive tolerance. Met. Intake to meet >90% of estimated nutrition needs. Unmet.  Monitor:   weight trends, lab trends, I/O's, TF tolerance, ability to advance diet   Assessment:   Admitted with severe abdominal pain and vomiting x 1 day. Denies alcohol consumption x 5 years. Work-up reveals severe acute pancreatitis. Initially admitted to [facility], however transferred to [facility] as pt was found to have moderate to severe necrotizing pancreatitis meeting SIRS criteria.  Pharmacy following potassium, phosphorus, and magnesium levels, repleting prn. Most recently, these labs are WNL.  Pt went to fluoro yesterday, abdomen x-ray revealed feeding tube has been placed in the proximal duodenum just past the ligament of Treitz. TF initiated yesterday. RN reports that pt is tolerating TF advancement well and pt is about to advance to Vital AF 1.2 at 40 ml/hr.   Height: Ht Readings from Last 1 Encounters:  06/01/12 5\' 7"  (1.702 m)    Weight Status:   Wt Readings from Last 1 Encounters:  06/05/12 185 lb 10 oz (84.2 kg)  Wt increased likely 2/2 +12 liters fluid balance since admission. Admit weight was 165 lb.   Re-estimated needs:  Kcal: 2000 - 2300 Protein: at least 112 grams Fluid: 2 - 2.4 liters  Skin: intact  Diet Order: NPO   Intake/Output Summary (Last 24 hours) at 06/05/12 1006 Last data filed at 06/05/12 0900  Gross per 24 hour  Intake   4322 ml   Output   1900 ml  Net   2422 ml    Last BM: 3/22   Labs:   Recent Labs Lab 06/02/12 1724 06/03/12 0533 06/04/12 0450 06/05/12 0425  NA 138 136 135 133*  K 3.5 3.4* 3.7 3.6  CL 107 103 101 98  CO2 22 25 25 22   BUN 9 7 7 7   CREATININE 0.71 0.67 0.73 0.70  CALCIUM 8.3* 8.3* 8.7 8.6  MG  --  1.9 2.0 2.0  PHOS 1.1*  --  1.8* 2.5  GLUCOSE 82 82 88 119*    CBG (last 3)  No results found for this basename: GLUCAP,  in the last 72 hours  Scheduled Meds: . ciprofloxacin  400 mg Intravenous Q12H  . enoxaparin (LOVENOX) injection  40 mg Subcutaneous Q24H  . hydrALAZINE  10 mg Intravenous Q6H  . levothyroxine  50 mcg Intravenous QAC breakfast  . metoprolol  10 mg Intravenous Q6H  . metronidazole  500 mg Intravenous Q8H  . morphine   Intravenous Q4H  . pantoprazole (PROTONIX) IV  40 mg Intravenous Q24H  . sodium chloride  3 mL Intravenous Q12H    Continuous Infusions: . 0.9 % NaCl with KCl 20 mEq / L 100 mL/hr at 06/05/12 0404  . feeding supplement (VITAL AF 1.2 CAL) 1,000 mL (06/04/12 1526)    Jarold Motto MS, RD, LDN Pager: [phone] After-hours pager: [phone]

## 2012-06-05 NOTE — Progress Notes (Signed)
     [facility] Daily Rounding Note 06/05/2012, 8:38 AM  SUBJECTIVE:       Pain still reaching to 6/10 but overall markedly improved.  No nausea, tolerating TF.  No BMs since PTA. Chills and sweats overnite but no documented fever. Continues to have good urine output.   OBJECTIVE:         Vital signs in last 24 hours:    Temp:  [97.4 F (36.3 C)-98.7 F (37.1 C)] 98.4 F (36.9 C) (03/26 0746) Pulse Rate:  [94-123] 123 (03/26 0746) Resp:  [14-23] 18 (03/26 0746) BP: (134-183)/(85-110) 145/89 mmHg (03/26 0746) SpO2:  [93 %-100 %] 100 % (03/26 0746) Weight:  [84.2 kg (185 lb 10 oz)] 84.2 kg (185 lb 10 oz) (03/26 0406) Last BM Date: 06/01/12 General: pleasant, comfortable, mildly ill.    Heart: tachy, regular.  Chest: crackles in bases.  Mild increase of resp effort.  No cough. Abdomen: tense, BS hypoactive, tender in RUQ/epigastrium.   Extremities: slight pedal edema on right. Neuro/Psych:  Pleasant, not somnolent, in good spirits.   Intake/Output from previous day: 03/25 0701 - 03/26 0700 In: 4282 [I.V.:2700; NG/GT:310; IV Piggyback:1272] Out: 1900 [Urine:1900]  Intake/Output this shift:    Lab Results:  Recent Labs  06/03/12 0533 06/04/12 0450 06/05/12 0425  WBC 11.0* 13.3* 15.8*  HGB 12.5* 14.0 14.1  HCT 36.8* 39.0 40.2  PLT 148* 193 244   BMET  Recent Labs  06/03/12 0533 06/04/12 0450 06/05/12 0425  NA 136 135 133*  K 3.4* 3.7 3.6  CL 103 101 98  CO2 25 25 22   GLUCOSE 82 88 119*  BUN 7 7 7   CREATININE 0.67 0.73 0.70  CALCIUM 8.3* 8.7 8.6   LFT  Recent Labs  06/03/12 0533 06/04/12 0450  PROT 6.4 6.8  ALBUMIN 2.7* 2.8*  AST 16 13  ALT 99* 76*  ALKPHOS 225* 206*  BILITOT 1.1 0.9    ASSESMENT: * Acute on chronic, recurrent pancreatitis. ? Intrinsic vs extrinsic CBD narrowing. 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 .  Surgery does not see a role in setting of sterile necrosis. On empiric Cipro and Flagyl. Agressively hydrating.  Excellent urine output.  LFTs steadily improving.  WBCs worsening though day 6 Cipro/Flagyl  Renal function not compromised. Tolerating tube feeds.  * SIRS  * Tachycardia.  *  Hyponatremia.    PLAN: *  Supportive care.     LOS: 6 days   Jennye Moccasin  06/05/2012, 8:38 AM Pager: [phone]  GI ATTENDING  Patient seen and examined. Wife in room. Agree with assessment and exam as outlined. No problems overnight. Abdomen slightly better. Less distended on exam. No fevers. Liver tests improving. Leukocytosis noted. He is using incentive spirometer and increasing activity. Continue with overall general on supportive care. He may need followup imaging at some point depending upon his clinical progress. We will continue to follow.  Wilhemina Bonito. Eda Keys., M.D. [facility] Division of Gastroenterology

## 2012-06-05 NOTE — Progress Notes (Signed)
Patient ID: Justin [name], male   DOB: 11-Mar-1956, 57 y.o.   MRN: [id number]  LOS: 6 days   Subjective: Pt still complaining of some generalized abdominal pain. PANDA tube in place - pt denies problems. No BM since last seen.  Objective: Vital signs in last 24 hours: Temp:  [97.4 F (36.3 C)-98.7 F (37.1 C)] 98.4 F (36.9 C) (03/26 0746) Pulse Rate:  [94-123] 123 (03/26 0746) Resp:  [14-23] 18 (03/26 0746) BP: (134-183)/(85-110) 145/89 mmHg (03/26 0746) SpO2:  [93 %-100 %] 100 % (03/26 0746) Weight:  [185 lb 10 oz (84.2 kg)] 185 lb 10 oz (84.2 kg) (03/26 0406) Last BM Date: 06/01/12   Laboratory  CBC  Recent Labs  06/04/12 0450 06/05/12 0425  WBC 13.3* 15.8*  HGB 14.0 14.1  HCT 39.0 40.2  PLT 193 244   BMET  Recent Labs  06/04/12 0450 06/05/12 0425  NA 135 133*  K 3.7 3.6  CL 101 98  CO2 25 22  GLUCOSE 88 119*  BUN 7 7  CREATININE 0.73 0.70  CALCIUM 8.7 8.6     Physical Exam General appearance: alert and cooperative Cardio: tachycardic GI: abnormal findings:  distended, mild TTP     Assessment/Plan: 1. Pancreatitis, some necrosis seen on MRI  Plan:  1. TFs per nutrition.  2. Pancreatitis seems to be improving some. No need for surgical intervention at this time.  LOS: 6 days     Ralene Muskrat, PA-S  06/05/2012  The patient seems a little more distended today, but less tender,  TFs going at 30cc/hr right now and he seems to be tolerating them without any diarrhea so far.  If abdominal distention continues to worsen and he develops nausea or vomiting, then we would likely need to hold TFs.  Patient still tachy and WBC trending up.  Patient is stable and abdominal examine actually seems a bit improved despite labs and vitals.  No surgical indications.  Continue conservative management.  Keiji Melland E

## 2012-06-05 NOTE — Progress Notes (Signed)
[facility] Progress Note [place] TEAM 1 - Stepdown/ICU TEAM   Justin [name] ZOX:[id number] DOB: 02/19/1956 DOA: 05/30/2012 PCP: No primary provider on file.  Brief narrative: 57 y.o. Male with history of cholecystectomy, DJD/chronic back pain, fibromyalgia, s/p previous CVA with minimal deficit, presented to [facility] with severe abdominal pain and vomiting since night of 05/30/12. Patient's wife has a habit of bringing him a milkshake every night.  He was nauseous and after the milkshake, got much worse. Since AM of 05/31/12, had been vomiting, unable to keep anything down. Because the pain was intolerable, he came to the emergency room, where blood work revealed a markedly elevated lipase and abnormal LFTs.   Patient was initially admitted to [facility], and GI consultation was provided by Dr Eula Listen, who recommended MRCP. On patient's request, he was transferred to [facility] 3/22. En route, MD was contacted by [facility] to say that patient was in acute pain, despite iv Fentanyl, was tachypneic, and tachycardic. After discussion with [facility] RN, patient was diverted to SDU.   Assessment/Plan:  Idiopathic severe pancreatitis with increased LFTs and prominent CBD and intrahepatic ducts cont abx due to concern for necrotic element to pancreatitis - LFTs have normalized - Tbil normalized - see recs as per GI consult note - cont volume resuscitation - must watch electrolytes very closely - NG tube with postpyloric tube feeds being tolerated well at this time   SIRS (HR > 90, WBC > 12K)  Due to above - has responded well to aggressive volume resusciation - UOP responded appropriately - BUN not markedly elevated at admit - Hct increased again today - will increase IVF rate with goal Hct of 35 or less   Hypokalemia Replaced - cont to follow - Mg is normal  Hypophosphatemia monitor closely with initiation of tube feeds  High cholesterol Trig not elevated to point to likley be the cause of  pancreatitis - hold Zocor for now   Fibromyalgia Well compensated at this time  CAD of artery bypass graft Denies CP - clinically stable   Uncontrolled HTN Likely driven by pain and inflammatory state - is not on chronic meds at home - no changes made in medications   Hypothyroidism Cont home synthroid dose for now - TSH not typically accurate in setting of acute severe illness  Code Status: FULL    Family Communication: spoke w/ pt Disposition Plan: SDU  Consultants: GI - [facility]   Procedures: none  Antibiotics: Ciprofloxacin 05/31/12 >>  Flagyl 05/31/12 >>  DVT prophylaxis: lovenox  HPI/Subjective: Patient is in good spirits today.  He reports that his pain is well controlled.  He denies nausea vomiting shortness of breath chest pain.  Objective: Blood pressure 145/89, pulse 123, temperature 98.3 F (36.8 C), temperature source Oral, resp. rate 16, height 5\' 7"  (1.702 m), weight 84.2 kg (185 lb 10 oz), SpO2 96.00%.  Intake/Output Summary (Last 24 hours) at 06/05/12 1349 Last data filed at 06/05/12 1200  Gross per 24 hour  Intake   4292 ml  Output   2350 ml  Net   1942 ml   Exam: General: No acute distress Lungs: Clear to auscultation bilaterally without wheezes or crackles Cardiovascular: Mild tachycardic, regular rhythm without murmur gallop or rub normal S1 and S2 Abdomen: less tender in epigastrium, protuberent, soft, bowel sounds positive, no rebound, no ascites, no appreciable mass Extremities: No significant cyanosis, clubbing, or edema bilateral lower extremities  Data Reviewed: Basic Metabolic Panel:  Recent Labs  Lab 05/30/12 1855 05/31/12 0512  06/02/12 0545 06/02/12 1724 06/03/12 0533 06/04/12 0450 06/05/12 0425  NA 141 140  < > 141 138 136 135 133*  K 3.0* 3.9  < > 3.5 3.5 3.4* 3.7 3.6  CL 103 104  < > 106 107 103 101 98  CO2 26 22  < > 25 22 25 25 22   GLUCOSE 184* 157*  < > 111* 82 82 88 119*  BUN 11 7  < > 11 9 7 7 7    CREATININE 1.03 0.71  < > 0.75 0.71 0.67 0.73 0.70  CALCIUM 9.4 9.1  < > 8.4 8.3* 8.3* 8.7 8.6  MG  --  1.7  --   --   --  1.9 2.0 2.0  PHOS  --   --   --   --  1.1*  --  1.8* 2.5  < > = values in this interval not displayed. Liver Function Tests:  Recent Labs Lab 05/31/12 0512 06/01/12 0635 06/02/12 0545 06/03/12 0533 06/04/12 0450  AST 318* 83* 28 16 13   ALT 431* 274* 144* 99* 76*  ALKPHOS 582* 442* 273* 225* 206*  BILITOT 3.1* 2.7* 1.5* 1.1 0.9  PROT 8.1 6.9 6.4 6.4 6.8  ALBUMIN 4.3 3.2* 2.7* 2.7* 2.8*    Recent Labs Lab 05/30/12 1855 06/02/12 0545  LIPASE >3000* 222*   CBC:  Recent Labs Lab 05/30/12 1855 05/31/12 0512 06/01/12 0635 06/03/12 0533 06/04/12 0450 06/05/12 0425  WBC 21.6* 17.9* 19.9* 11.0* 13.3* 15.8*  NEUTROABS 17.4*  --   --   --   --   --   HGB 14.8 17.5* 16.2 12.5* 14.0 14.1  HCT 43.3 50.2 48.2 36.8* 39.0 40.2  MCV 86.3 85.4 86.2 84.0 81.9 83.2  PLT 246 230 195 148* 193 244   Cardiac Enzymes:  Recent Labs Lab 05/30/12 2327  TROPONINI <0.30    Recent Results (from the past 240 hour(s))  MRSA PCR SCREENING     Status: None   Collection Time    06/01/12  3:00 PM      Result Value Range Status   MRSA by PCR NEGATIVE  NEGATIVE Final   Comment:            The GeneXpert MRSA Assay (FDA     approved for NASAL specimens     only), is one component of a     comprehensive MRSA colonization     surveillance program. It is not     intended to diagnose MRSA     infection nor to guide or     monitor treatment for     MRSA infections.     Studies:  Recent x-ray studies have been reviewed in detail by the Attending Physician  Scheduled Meds:  Scheduled Meds: . ciprofloxacin  400 mg Intravenous Q12H  . enoxaparin (LOVENOX) injection  40 mg Subcutaneous Q24H  . hydrALAZINE  10 mg Intravenous Q6H  . levothyroxine  50 mcg Intravenous QAC breakfast  . metoprolol  10 mg Intravenous Q6H  . metronidazole  500 mg Intravenous Q8H  .  morphine   Intravenous Q4H  . pantoprazole (PROTONIX) IV  40 mg Intravenous Q24H  . sodium chloride  3 mL Intravenous Q12H   Continuous Infusions: . 0.9 % NaCl with KCl 20 mEq / L 100 mL/hr at 06/05/12 0404  . feeding supplement (VITAL AF 1.2 CAL) 1,000 mL (06/04/12 1526)    Time spent on care of this patient: 35 mins  Lonia Blood  [facility] Office  [phone] Pager - Text Page per Loretha Stapler as per below:  On-Call/Text Page:      [email or web address]      password [email or web address]  If 7PM-7AM, please contact night-coverage [email or web address] Password [email or web address] 06/05/2012, 1:49 PM   LOS: 6 days

## 2012-06-05 NOTE — Progress Notes (Signed)
Continue to watch closely with tachycardia and WBC elevation Justin Gelinas, MD, MPH, FACS Pager: [phone]

## 2012-06-05 NOTE — Progress Notes (Signed)
Pharmacy Consult for potassium replacement  Justin [name] is a 57 yo M with recurrent pancreatitis.  Pharmacy has been asked to assist with K replacement.   BMET    Component Value Date/Time   NA 133* 06/05/2012 0425   K 3.6 06/05/2012 0425   CL 98 06/05/2012 0425   CO2 22 06/05/2012 0425   GLUCOSE 119* 06/05/2012 0425   BUN 7 06/05/2012 0425   CREATININE 0.70 06/05/2012 0425   CALCIUM 8.6 06/05/2012 0425   GFRNONAA >90 06/05/2012 0425   GFRAA >90 06/05/2012 0425  Phos 2.5 Magnesium 2  Assessment:  K, Mg and phos in normal lab ranges today.  Has normal saline with 20 meq KCL/L at 100 ml/hr running.  Plan:  No magnesium or phosphorous replacement needed today.  Agree with KCL in IV fluids.  Celedonio Miyamoto, PharmD, BCPS Clinical Pharmacist Pager [phone]   06/05/2012 9:09 AM

## 2012-06-06 LAB — COMPREHENSIVE METABOLIC PANEL
Albumin: 2.4 g/dL — ABNORMAL LOW (ref 3.5–5.2)
Alkaline Phosphatase: 173 U/L — ABNORMAL HIGH (ref 39–117)
BUN: 10 mg/dL (ref 6–23)
Potassium: 3.9 mEq/L (ref 3.5–5.1)
Sodium: 136 mEq/L (ref 135–145)
Total Protein: 6.1 g/dL (ref 6.0–8.3)

## 2012-06-06 LAB — CBC
HCT: 36.3 % — ABNORMAL LOW (ref 39.0–52.0)
MCV: 81.8 fL (ref 78.0–100.0)
RDW: 15.1 % (ref 11.5–15.5)
WBC: 15.9 10*3/uL — ABNORMAL HIGH (ref 4.0–10.5)

## 2012-06-06 MED ORDER — METOPROLOL TARTRATE 1 MG/ML IV SOLN
5.0000 mg | Freq: Once | INTRAVENOUS | Status: AC
  Administered 2012-06-06: 5 mg via INTRAVENOUS

## 2012-06-06 MED ORDER — CHLORHEXIDINE GLUCONATE 0.12 % MT SOLN
15.0000 mL | Freq: Two times a day (BID) | OROMUCOSAL | Status: DC
  Administered 2012-06-07 – 2012-06-14 (×15): 15 mL via OROMUCOSAL
  Filled 2012-06-06 (×17): qty 15

## 2012-06-06 MED ORDER — FAMOTIDINE IN NACL 20-0.9 MG/50ML-% IV SOLN
20.0000 mg | Freq: Once | INTRAVENOUS | Status: AC
  Administered 2012-06-06: 20 mg via INTRAVENOUS
  Filled 2012-06-06: qty 50

## 2012-06-06 MED ORDER — BIOTENE DRY MOUTH MT LIQD
15.0000 mL | Freq: Two times a day (BID) | OROMUCOSAL | Status: DC
  Administered 2012-06-07 – 2012-06-14 (×15): 15 mL via OROMUCOSAL

## 2012-06-06 MED ORDER — GI COCKTAIL ~~LOC~~
30.0000 mL | Freq: Once | ORAL | Status: AC
  Administered 2012-06-06: 30 mL via ORAL
  Filled 2012-06-06: qty 30

## 2012-06-06 NOTE — Progress Notes (Signed)
     [facility] Daily Rounding Note 06/06/2012, 9:16 AM  SUBJECTIVE:       Pain still at about 6/10 max but using less PCA.  Had a BM overnight. No nausea. Religious use of incentive spirometer.   OBJECTIVE:         Vital signs in last 24 hours:    Temp:  [97.7 F (36.5 C)-99.1 F (37.3 C)] 97.8 F (36.6 C) (03/27 0830) Pulse Rate:  [100-131] 100 (03/27 0830) Resp:  [10-23] 17 (03/27 0830) BP: (108-132)/(61-80) 132/80 mmHg (03/27 0914) SpO2:  [93 %-96 %] 95 % (03/27 0429) Weight:  [86.2 kg (190 lb 0.6 oz)] 86.2 kg (190 lb 0.6 oz) (03/27 0418) Last BM Date: 06/01/12 General: pleasant, non-toxic   Heart: RRR Chest: some fine crackles in bases Abdomen: protuberant, tense, tender in epigastrum/upper abdomen.  BS present  Extremities: no CCE.  Feet warm Neuro/Psych:  Pleasant, cooperative.   Intake/Output from previous day: 03/26 0701 - 03/27 0700 In: 4696.7 [I.V.:3056.7; NG/GT:1140; IV Piggyback:500] Out: 1551 [Urine:1550; Stool:1]  Intake/Output this shift:    Lab Results:  Recent Labs  06/04/12 0450 06/05/12 0425 06/06/12 0445  WBC 13.3* 15.8* 15.9*  HGB 14.0 14.1 13.1  HCT 39.0 40.2 36.3*  PLT 193 244 233   BMET  Recent Labs  06/04/12 0450 06/05/12 0425 06/06/12 0445  NA 135 133* 136  K 3.7 3.6 3.9  CL 101 98 102  CO2 25 22 26   GLUCOSE 88 119* 109*  BUN 7 7 10   CREATININE 0.73 0.70 0.81  CALCIUM 8.7 8.6 8.2*   LFT  Recent Labs  06/04/12 0450 06/06/12 0445  PROT 6.8 6.1  ALBUMIN 2.8* 2.4*  AST 13 10  ALT 76* 40  ALKPHOS 206* 173*  BILITOT 0.9 0.5   ASSESMENT: * Acute on chronic, recurrent pancreatitis. ? Intrinsic vs extrinsic CBD narrowing. 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 .  Surgery does not see a role in setting of sterile necrosis. On empiric Cipro and Flagyl. Agressively hydrating. Excellent urine output.  LFTs steadily improving. WBCs worsening though day 6 Cipro/Flagyl Renal function not compromised. Tolerating tube  feeds.  * SIRS  * Tachycardia.  * Hyponatremia.     PLAN: * ? Transfer out of stepdown to tele floor? *  ? Allow clears in addition to continuing tube feeds.?   LOS: 7 days   Jennye Moccasin  06/06/2012, 9:16 AM Pager: [phone]  GI ATTENDING  Interval history and laboratories reviewed. Patient seen and examined. Agree with above. Wife in room. Patient is feeling much better today. He is moving his bowels and passing gas. Less abdominal discomfort, though still with pain. No nausea or vomiting. Good spirits. Laboratories continue to improve. No fevers. Abdominal exam improved as well. Agree that would be reasonable to allow him to try clear liquids along with his tube feeds. As well, remove unnecessary lines to allow increased mobility.  Wilhemina Bonito. Eda Keys., M.D. [facility] of Gastroenterology

## 2012-06-06 NOTE — Progress Notes (Signed)
  Subjective: Feeling a little better, mild abdominal pain, +BM  Objective: Vital signs in last 24 hours: Temp:  [97.7 F (36.5 C)-99.1 F (37.3 C)] 97.8 F (36.6 C) (03/27 0830) Pulse Rate:  [100-131] 100 (03/27 0830) Resp:  [10-23] 17 (03/27 0830) BP: (108-127)/(61-75) 110/74 mmHg (03/27 0830) SpO2:  [93 %-96 %] 95 % (03/27 0429) Weight:  [86.2 kg (190 lb 0.6 oz)] 86.2 kg (190 lb 0.6 oz) (03/27 0418) Last BM Date: 06/01/12  Intake/Output from previous day: 03/26 0701 - 03/27 0700 In: 4696.7 [I.V.:3056.7; NG/GT:1140; IV Piggyback:500] Out: 1551 [Urine:1550; Stool:1] Intake/Output this shift:    General appearance: alert and cooperative Resp: clear to auscultation bilaterally Cardio: RRR 100s GI: soft, distended, +BS, tender epigastrium/lower midline without guarding Neurologic: Mental status: Alert, oriented, thought content appropriate  Lab Results:   Recent Labs  06/05/12 0425 06/06/12 0445  WBC 15.8* 15.9*  HGB 14.1 13.1  HCT 40.2 36.3*  PLT 244 233   BMET  Recent Labs  06/05/12 0425 06/06/12 0445  NA 133* 136  K 3.6 3.9  CL 98 102  CO2 22 26  GLUCOSE 119* 109*  BUN 7 10  CREATININE 0.70 0.81  CALCIUM 8.6 8.2*   PT/INR No results found for this basename: LABPROT, INR,  in the last 72 hours ABG No results found for this basename: PHART, PCO2, PO2, HCO3,  in the last 72 hours  Studies/Results: Dg Abd 1 View  06/04/2012  *RADIOLOGY REPORT*  Clinical Data: Feeding tube placement  ABDOMEN - 1 VIEW  Comparison: None.  Fluoroscopy time: 18 seconds  Findings: Weighted feeding tube has been placed in the proximal duodenum just past the ligament of Treitz.  IMPRESSION: Weighted feeding tube terminates in the proximal duodenum.   Original Report Authenticated By: Charline Bills, M.D.    Dg Naso G Tube Plc W/fl-no Rad  06/04/2012  CLINICAL DATA:    NASO G TUBE PLACEMENT WITH FLUORO  Fluoroscopy was utilized by the requesting physician.  No radiographic   interpretation.      Anti-infectives: Anti-infectives   Start     Dose/Rate Route Frequency Ordered Stop   05/31/12 1400  ciprofloxacin (CIPRO) IVPB 400 mg     400 mg 200 mL/hr over 60 Minutes Intravenous Every 12 hours 05/31/12 1323     05/31/12 1400  metroNIDAZOLE (FLAGYL) IVPB 500 mg     500 mg 100 mL/hr over 60 Minutes Intravenous Every 8 hours 05/31/12 1323        Assessment/Plan: 1. Pancreatitis, some necrosis seen on MRI  Plan:  1. TFs per nutrition. 2. Pancreatitis seems to be improving some. No need for surgical intervention at this time.  LOS: 6 days   LOS: 7 days    Justin [name] 06/06/2012

## 2012-06-06 NOTE — Progress Notes (Signed)
[facility] Progress Note [place] TEAM 1 - Stepdown/ICU TEAM   Justin [name] ZOX:[id number] DOB: 1955-10-22 DOA: 05/30/2012 PCP: No primary provider on file.  Brief narrative: 57 y.o. Male with history of cholecystectomy, DJD/chronic back pain, fibromyalgia, s/p previous CVA with minimal deficit, presented to [facility] with severe abdominal pain and vomiting since night of 05/30/12. Patient's wife has a habit of bringing him a milkshake every night.  He was nauseous and after the milkshake, got much worse. Since AM of 05/31/12, had been vomiting, unable to keep anything down. Because the pain was intolerable, he came to the emergency room, where blood work revealed a markedly elevated lipase and abnormal LFTs.   Patient was initially admitted to [facility], and GI consultation was provided by Dr Eula Listen, who recommended MRCP. On patient's request, he was transferred to [facility] 3/22. En route, MD was contacted by [facility] to say that patient was in acute pain, despite iv Fentanyl, was tachypneic, and tachycardic. After discussion with [facility] RN, patient was diverted to SDU.   Assessment/Plan:  Idiopathic severe pancreatitis with increased LFTs and prominent CBD and intrahepatic ducts cont abx due to concern for necrotic element to pancreatitis - LFTs have normalized - Tbil normalized - see recs as per GI consult note - cont volume resuscitation - must watch electrolytes very closely - NG tube with postpyloric tube feeds being tolerated well at this time   SIRS (HR > 90, WBC > 12K)  Due to above - has responded well to aggressive volume resusciation - UOP responded appropriately - BUN not markedly elevated at admit - Hct increased again on 3/26 when IVf rate was decreased - Keep IVF rate at 150 cc/hr for now with goal Hct of 35 or less   Hypokalemia Replaced - cont to follow - Mg is normal  Hypophosphatemia monitor closely with initiation of tube feeds  High cholesterol Trig not  elevated to point to likley be the cause of pancreatitis - hold Zocor for now   Fibromyalgia Well compensated at this time  CAD of artery bypass graft Denies CP - clinically stable   Uncontrolled HTN Likely driven by pain and inflammatory state - is not on chronic meds at home - no changes made in medications   Hypothyroidism Cont home synthroid dose for now - TSH not typically accurate in setting of acute severe illness  Code Status: FULL    Family Communication: spoke w/ pt Disposition Plan: SDU  Consultants: GI - [facility]   Procedures: none  Antibiotics: Ciprofloxacin 05/31/12 >>  Flagyl 05/31/12 >>  DVT prophylaxis: lovenox  HPI/Subjective: Pain controlled at a level 5/10. Tolerating tube feeds. One BM last night at 3 AM. No abdominal pain or increase distension.   Objective: Blood pressure 133/86, pulse 97, temperature 98.2 F (36.8 C), temperature source Oral, resp. rate 19, height 5\' 7"  (1.702 m), weight 86.2 kg (190 lb 0.6 oz), SpO2 96.00%.  Intake/Output Summary (Last 24 hours) at 06/06/12 1508 Last data filed at 06/06/12 0700  Gross per 24 hour  Intake   3290 ml  Output    901 ml  Net   2389 ml   Exam: General: No acute distress Lungs: Clear to auscultation bilaterally without wheezes or crackles Cardiovascular: Mild tachycardic, regular rhythm without murmur gallop or rub normal S1 and S2 Abdomen: less tender in epigastrium, protuberent, soft, bowel sounds positive, no rebound, no ascites, no appreciable mass, tympanic to percussion Extremities: No significant cyanosis, clubbing, or edema  bilateral lower extremities  Data Reviewed: Basic Metabolic Panel:  Recent Labs Lab 05/30/12 1855 05/31/12 2130  06/02/12 0545 06/02/12 1724 06/03/12 0533 06/04/12 0450 06/05/12 0425 06/05/12 1424 06/06/12 0445  NA 141 140  < > 141 138 136 135 133*  --  136  K 3.0* 3.9  < > 3.5 3.5 3.4* 3.7 3.6  --  3.9  CL 103 104  < > 106 107 103 101 98  --  102   CO2 26 22  < > 25 22 25 25 22   --  26  GLUCOSE 184* 157*  < > 111* 82 82 88 119*  --  109*  BUN 11 7  < > 11 9 7 7 7   --  10  CREATININE 1.03 0.71  < > 0.75 0.71 0.67 0.73 0.70  --  0.81  CALCIUM 9.4 9.1  < > 8.4 8.3* 8.3* 8.7 8.6  --  8.2*  MG  --  1.7  --   --   --  1.9 2.0 2.0  --   --   PHOS  --   --   --   --  1.1*  --  1.8* 2.5 2.5  --   < > = values in this interval not displayed. Liver Function Tests:  Recent Labs Lab 06/01/12 0635 06/02/12 0545 06/03/12 0533 06/04/12 0450 06/06/12 0445  AST 83* 28 16 13 10   ALT 274* 144* 99* 76* 40  ALKPHOS 442* 273* 225* 206* 173*  BILITOT 2.7* 1.5* 1.1 0.9 0.5  PROT 6.9 6.4 6.4 6.8 6.1  ALBUMIN 3.2* 2.7* 2.7* 2.8* 2.4*    Recent Labs Lab 05/30/12 1855 06/02/12 0545  LIPASE >3000* 222*   CBC:  Recent Labs Lab 05/30/12 1855  06/01/12 0635 06/03/12 0533 06/04/12 0450 06/05/12 0425 06/06/12 0445  WBC 21.6*  < > 19.9* 11.0* 13.3* 15.8* 15.9*  NEUTROABS 17.4*  --   --   --   --   --   --   HGB 14.8  < > 16.2 12.5* 14.0 14.1 13.1  HCT 43.3  < > 48.2 36.8* 39.0 40.2 36.3*  MCV 86.3  < > 86.2 84.0 81.9 83.2 81.8  PLT 246  < > 195 148* 193 244 233  < > = values in this interval not displayed. Cardiac Enzymes:  Recent Labs Lab 05/30/12 2327  TROPONINI <0.30    Recent Results (from the past 240 hour(s))  MRSA PCR SCREENING     Status: None   Collection Time    06/01/12  3:00 PM      Result Value Range Status   MRSA by PCR NEGATIVE  NEGATIVE Final   Comment:            The GeneXpert MRSA Assay (FDA     approved for NASAL specimens     only), is one component of a     comprehensive MRSA colonization     surveillance program. It is not     intended to diagnose MRSA     infection nor to guide or     monitor treatment for     MRSA infections.     Studies:  Recent x-ray studies have been reviewed in detail by the Attending Physician  Scheduled Meds:  Scheduled Meds: . [START ON 06/07/2012] antiseptic oral  rinse  15 mL Mouth Rinse q12n4p  . [START ON 06/07/2012] chlorhexidine  15 mL Mouth Rinse BID  . ciprofloxacin  400 mg Intravenous Q12H  .  enoxaparin (LOVENOX) injection  40 mg Subcutaneous Q24H  . hydrALAZINE  10 mg Intravenous Q6H  . levothyroxine  50 mcg Intravenous QAC breakfast  . metoprolol  10 mg Intravenous Q6H  . metronidazole  500 mg Intravenous Q8H  . morphine   Intravenous Q4H  . pantoprazole (PROTONIX) IV  40 mg Intravenous Q24H  . sodium chloride  3 mL Intravenous Q12H   Continuous Infusions: . 0.9 % NaCl with KCl 20 mEq / L 1,000 mL (06/05/12 2341)  . feeding supplement (VITAL AF 1.2 CAL) 1,000 mL (06/06/12 0211)    Time spent on care of this patient: 25 mins   [facility]  [facility] Office  [phone] Pager - Text Page per Loretha Stapler as per below:  On-Call/Text Page:      [email or web address]      password [email or web address]  If 7PM-7AM, please contact night-coverage [email or web address] Password [email or web address] 06/06/2012, 3:08 PM   LOS: 7 days

## 2012-06-07 DIAGNOSIS — E039 Hypothyroidism, unspecified: Secondary | ICD-10-CM | POA: Diagnosis present

## 2012-06-07 DIAGNOSIS — F329 Major depressive disorder, single episode, unspecified: Secondary | ICD-10-CM

## 2012-06-07 LAB — BASIC METABOLIC PANEL
CO2: 25 mEq/L (ref 19–32)
Calcium: 8.2 mg/dL — ABNORMAL LOW (ref 8.4–10.5)
Glucose, Bld: 142 mg/dL — ABNORMAL HIGH (ref 70–99)
Potassium: 3.9 mEq/L (ref 3.5–5.1)
Sodium: 135 mEq/L (ref 135–145)

## 2012-06-07 MED ORDER — OXYCODONE-ACETAMINOPHEN 5-325 MG PO TABS
1.0000 | ORAL_TABLET | ORAL | Status: DC | PRN
  Administered 2012-06-10: 2 via ORAL
  Filled 2012-06-07: qty 2

## 2012-06-07 MED ORDER — LEVOTHYROXINE SODIUM 100 MCG PO TABS
100.0000 ug | ORAL_TABLET | Freq: Every day | ORAL | Status: DC
  Administered 2012-06-08 – 2012-06-10 (×3): 100 ug via ORAL
  Filled 2012-06-07 (×7): qty 1

## 2012-06-07 MED ORDER — DULOXETINE HCL 60 MG PO CPEP
60.0000 mg | ORAL_CAPSULE | Freq: Every day | ORAL | Status: DC
  Administered 2012-06-07 – 2012-06-14 (×8): 60 mg via ORAL
  Filled 2012-06-07 (×8): qty 1

## 2012-06-07 NOTE — Progress Notes (Signed)
NUTRITION FOLLOW UP  Intervention:    Continue Vital AF 1.2 at 70 ml/hr. Goal rate will provide: 2016 kcal, 126 grams protein, 1362 ml free water, and 100% RDIs.  Diet advancement with TF wean per team. RD to continue to follow nutrition care plan  Nutrition Dx:   Inadequate oral intake related to inability to eat as evidenced by NPO status. Ongoing.  Goal:   Initiate EN with positive tolerance. Met. Intake to meet >90% of estimated nutrition needs. Met.  Monitor:   weight trends, lab trends, I/O's, TF tolerance, ability to advance diet   Assessment:   Admitted with severe abdominal pain and vomiting x 1 day. Denies alcohol consumption x 5 years. Work-up reveals severe acute pancreatitis. Initially admitted to [facility], however transferred to [facility] as pt was found to have moderate to severe necrotizing pancreatitis meeting SIRS criteria.  Pharmacy was following potassium, phosphorus, and magnesium levels, repleting prn; signed off today 2/2 labs WNL.  Currently receiving Vital AF 1.2 at 70 ml/hr; provides 2016 kcal, 126 grams protein, 1362 ml free water, and 100% RDIs. RN reports that pt is tolerating goal rate well, abdomen does remain distended at this time. Advanced to Clear Liquids this morning approximately 1 hour ago - has yet to receive tray.  Height: Ht Readings from Last 1 Encounters:  06/01/12 5\' 7"  (1.702 m)    Weight Status:   Wt Readings from Last 1 Encounters:  06/07/12 194 lb 3.6 oz (88.1 kg)  Wt increased likely 2/2 +20 liters fluid balance since admission. Admit weight was 165 lb.   Re-estimated needs:  Kcal: 2000 - 2300 Protein: at least 112 grams Fluid: 2 - 2.4 liters  Skin: intact  Diet Order: Clear Liquid   Intake/Output Summary (Last 24 hours) at 06/07/12 0948 Last data filed at 06/07/12 0914  Gross per 24 hour  Intake   5863 ml  Output    400 ml  Net   5463 ml    Last BM: 3/27   Labs:   Recent Labs Lab 06/04/12 0450 06/05/12 0425  06/05/12 1424 06/06/12 0445 06/07/12 0645  NA 135 133*  --  136 135  K 3.7 3.6  --  3.9 3.9  CL 101 98  --  102 102  CO2 25 22  --  26 25  BUN 7 7  --  10 12  CREATININE 0.73 0.70  --  0.81 0.70  CALCIUM 8.7 8.6  --  8.2* 8.2*  MG 2.0 2.0  --   --  2.0  PHOS 1.8* 2.5 2.5  --  2.6  GLUCOSE 88 119*  --  109* 142*    CBG (last 3)  No results found for this basename: GLUCAP,  in the last 72 hours  Scheduled Meds: . antiseptic oral rinse  15 mL Mouth Rinse q12n4p  . chlorhexidine  15 mL Mouth Rinse BID  . ciprofloxacin  400 mg Intravenous Q12H  . enoxaparin (LOVENOX) injection  40 mg Subcutaneous Q24H  . hydrALAZINE  10 mg Intravenous Q6H  . levothyroxine  50 mcg Intravenous QAC breakfast  . metoprolol  10 mg Intravenous Q6H  . metronidazole  500 mg Intravenous Q8H  . morphine   Intravenous Q4H  . sodium chloride  3 mL Intravenous Q12H    Continuous Infusions: . 0.9 % NaCl with KCl 20 mEq / L 50 mL/hr at 06/07/12 0913  . feeding supplement (VITAL AF 1.2 CAL) 1,000 mL (06/06/12 1955)    Jarold Motto  MS, RD, LDN Pager: [phone] After-hours pager: [phone]

## 2012-06-07 NOTE — Progress Notes (Signed)
2200 Pt complaining of Heart palpatations. HR sustaining 120s. Notified K.Schorr of pt complaints. New orders received.

## 2012-06-07 NOTE — Progress Notes (Addendum)
[facility] Progress Note [place] TEAM 1 - Stepdown/ICU TEAM   Justin [name] ZOX:[id number] DOB: [birth year] DOA: 05/30/2012 PCP: No primary provider on file.  Brief narrative: 57 y.o. Male with history of cholecystectomy, DJD/chronic back pain, fibromyalgia, s/p previous CVA with minimal deficit, presented to [facility] with severe abdominal pain and vomiting since night of 05/30/12. Patient's wife has a habit of bringing him a milkshake every night.  He was nauseous and after the milkshake, got much worse. Since AM of 05/31/12, had been vomiting, unable to keep anything down. Because the pain was intolerable, he came to the emergency room, where blood work revealed a markedly elevated lipase and abnormal LFTs.   Patient was initially admitted to [facility], and GI consultation was provided by Dr Eula Listen, who recommended MRCP. On patient's request, he was transferred to [facility] 3/22. En route, MD was contacted by [facility] to say that patient was in acute pain, despite iv Fentanyl, was tachypneic, and tachycardic. After discussion with [facility] RN, patient was diverted to SDU.   Assessment/Plan:  Idiopathic severe necrotic pancreatitis with increased LFTs and prominent CBD and intrahepatic ducts cont abx due to necrotic pancreatitis - LFTs have normalized - Tbil normalized - see recs as per GI consult note - cont volume resuscitation - must watch electrolytes very closely - NG tube with postpyloric tube feeds being tolerated well at this time- tolerating clear liquids now- advance per GI  SIRS (HR > 90, WBC > 12K)  Due to above - has responded well to aggressive volume resusciation - UOP responded appropriately - BUN not markedly elevated at admit - Hct increased again on 3/26 when IVf rate was decreased therefore increased back to 150 cc/hr - IVF subsequently decreased to 50 per GI  Hypokalemia Replaced - cont to follow - Mg is normal  Hypophosphatemia monitor closely with initiation of  tube feeds  High cholesterol Triglygerides not elevated to the point of being the cause of pancreatitis - hold Zocor for now   Fibromyalgia Well compensated at this time  CAD of artery bypass graft Denies CP - clinically stable   Uncontrolled HTN Likely driven by pain and inflammatory state - is not on chronic meds at home - no changes made in medications- cont IV Hydralazine and Lopressor  Hypothyroidism Cont home synthroid dose for now - TSH not typically accurate in setting of acute severe illness  Depression Resume Cymbalta today  Code Status: FULL    Family Communication: spoke w/ pt Disposition Plan: transfer to med/surg floor today  Consultants: GI - [facility]   Procedures: none  Antibiotics: Ciprofloxacin 05/31/12 >>  Flagyl 05/31/12 >>  DVT prophylaxis: lovenox  HPI/Subjective: Pain controlled - drinking a good deal of clears today without any difficulty- No BM  Objective: Blood pressure 135/69, pulse 95, temperature 97.8 F (36.6 C), temperature source Axillary, resp. rate 22, height 5\' 7"  (1.702 m), weight 88.1 kg (194 lb 3.6 oz), SpO2 97.00%.  Intake/Output Summary (Last 24 hours) at 06/07/12 1441 Last data filed at 06/07/12 1406  Gross per 24 hour  Intake   7313 ml  Output   1800 ml  Net   5513 ml   Exam: General: No acute distress Lungs: Clear to auscultation bilaterally without wheezes or crackles Cardiovascular: Mild tachycardic, regular rhythm without murmur gallop or rub normal S1 and S2 Abdomen: non-tender, protuberant and tympanic to percussion, soft, bowel sounds positive, no rebound, no ascites, no appreciable mass Extremities: No significant cyanosis, clubbing,  or edema bilateral lower extremities  Data Reviewed: Basic Metabolic Panel:  Recent Labs Lab 06/02/12 1724 06/03/12 0533 06/04/12 0450 06/05/12 0425 06/05/12 1424 06/06/12 0445 06/07/12 0645  NA 138 136 135 133*  --  136 135  K 3.5 3.4* 3.7 3.6  --  3.9 3.9  CL  107 103 101 98  --  102 102  CO2 22 25 25 22   --  26 25  GLUCOSE 82 82 88 119*  --  109* 142*  BUN 9 7 7 7   --  10 12  CREATININE 0.71 0.67 0.73 0.70  --  0.81 0.70  CALCIUM 8.3* 8.3* 8.7 8.6  --  8.2* 8.2*  MG  --  1.9 2.0 2.0  --   --  2.0  PHOS 1.1*  --  1.8* 2.5 2.5  --  2.6   Liver Function Tests:  Recent Labs Lab 06/01/12 0635 06/02/12 0545 06/03/12 0533 06/04/12 0450 06/06/12 0445  AST 83* 28 16 13 10   ALT 274* 144* 99* 76* 40  ALKPHOS 442* 273* 225* 206* 173*  BILITOT 2.7* 1.5* 1.1 0.9 0.5  PROT 6.9 6.4 6.4 6.8 6.1  ALBUMIN 3.2* 2.7* 2.7* 2.8* 2.4*    Recent Labs Lab 06/02/12 0545  LIPASE 222*   CBC:  Recent Labs Lab 06/01/12 0635 06/03/12 0533 06/04/12 0450 06/05/12 0425 06/06/12 0445  WBC 19.9* 11.0* 13.3* 15.8* 15.9*  HGB 16.2 12.5* 14.0 14.1 13.1  HCT 48.2 36.8* 39.0 40.2 36.3*  MCV 86.2 84.0 81.9 83.2 81.8  PLT 195 148* 193 244 233   Cardiac Enzymes: No results found for this basename: CKTOTAL, CKMB, CKMBINDEX, TROPONINI,  in the last 168 hours  Recent Results (from the past 240 hour(s))  MRSA PCR SCREENING     Status: None   Collection Time    06/01/12  3:00 PM      Result Value Range Status   MRSA by PCR NEGATIVE  NEGATIVE Final   Comment:            The GeneXpert MRSA Assay (FDA     approved for NASAL specimens     only), is one component of a     comprehensive MRSA colonization     surveillance program. It is not     intended to diagnose MRSA     infection nor to guide or     monitor treatment for     MRSA infections.     Studies:  Recent x-ray studies have been reviewed in detail by the Attending Physician  Scheduled Meds:  Scheduled Meds: . antiseptic oral rinse  15 mL Mouth Rinse q12n4p  . chlorhexidine  15 mL Mouth Rinse BID  . ciprofloxacin  400 mg Intravenous Q12H  . enoxaparin (LOVENOX) injection  40 mg Subcutaneous Q24H  . hydrALAZINE  10 mg Intravenous Q6H  . levothyroxine  50 mcg Intravenous QAC breakfast  .  metoprolol  10 mg Intravenous Q6H  . metronidazole  500 mg Intravenous Q8H  . morphine   Intravenous Q4H  . sodium chloride  3 mL Intravenous Q12H   Continuous Infusions: . 0.9 % NaCl with KCl 20 mEq / L 50 mL/hr at 06/07/12 0913  . feeding supplement (VITAL AF 1.2 CAL) 1,000 mL (06/06/12 1955)    Time spent on care of this patient: 25 mins   [facility]  [facility] Office  [phone] Pager - Text Page per Loretha Stapler as per below:  On-Call/Text Page:  [email or web address]      password [email or web address]  If 7PM-7AM, please contact night-coverage [email or web address] Password [facility] 06/07/2012, 2:41 PM   LOS: 8 days     I have examined the patient, reviewed the chart and modified the above note which I agree with.   Emarie Paul,MD [phone] 06/07/2012, 2:51 PM

## 2012-06-07 NOTE — Progress Notes (Signed)
Pharmacy Consult for potassium replacement  Justin [name] is a 57 yo M with recurrent pancreatitis.  Pharmacy has been asked to assist with K replacement.   BMET    Component Value Date/Time   NA 135 06/07/2012 0645   K 3.9 06/07/2012 0645   CL 102 06/07/2012 0645   CO2 25 06/07/2012 0645   GLUCOSE 142* 06/07/2012 0645   BUN 12 06/07/2012 0645   CREATININE 0.70 06/07/2012 0645   CALCIUM 8.2* 06/07/2012 0645   GFRNONAA >90 06/07/2012 0645   GFRAA >90 06/07/2012 0645  Phos 2.6 Magnesium 2  Assessment:  K, Mg and phos in normal lab ranges today.  Has normal saline with 20 meq KCL/L at 150 ml/hr running.  Plan:  No magnesium or phosphorous replacement needed today.  Agree with KCL in IV fluids. Plarmacy will sign off not that electrolytes have normalized.  Celedonio Miyamoto, PharmD, BCPS Clinical Pharmacist Pager [phone]   06/07/2012 7:54 AM

## 2012-06-07 NOTE — Progress Notes (Signed)
Pt transferred to 5527 via wheelchair, IVF infusing, O2 at 2L. VS stable at time of transfer. Meds in chart, family and belongings with pt. No current questions or complaints at this time. Dore Oquin L

## 2012-06-07 NOTE — Progress Notes (Signed)
06/07/12 Patient arrived on floor from 2600 into Rm 5527. Dx of Acute Pancreatitis,Lives at home with wife,NG tube in rt nare with Vital AF 1.2 cal at 70cc/hr,diet cl liq, last BM 3/27,Full code, and Mod fall risk.

## 2012-06-07 NOTE — Progress Notes (Signed)
Report called to Zella Ball, 5500 RN. Charge RN told to hold transfer. Will cont to monitor.

## 2012-06-07 NOTE — Progress Notes (Signed)
  Subjective: Less pain  Objective: Vital signs in last 24 hours: Temp:  [97.2 F (36.2 C)-98.9 F (37.2 C)] 97.7 F (36.5 C) (03/28 0743) Pulse Rate:  [90-124] 92 (03/28 0743) Resp:  [13-22] 19 (03/28 0743) BP: (102-133)/(59-86) 117/71 mmHg (03/28 0743) SpO2:  [94 %-97 %] 97 % (03/28 0743) Weight:  [88.1 kg (194 lb 3.6 oz)] 88.1 kg (194 lb 3.6 oz) (03/28 0417) Last BM Date: 06/06/12  Intake/Output from previous day: 03/27 0701 - 03/28 0700 In: 5300 [P.O.:30; I.V.:3600; NG/GT:1120; IV Piggyback:550] Out: 0  Intake/Output this shift:    General appearance: alert and cooperative Nose: panda Resp: clear to auscultation bilaterally Cardio: regular rate and rhythm GI: soft, mild epig tend - less than yesterday, +BS  Lab Results:   Recent Labs  06/05/12 0425 06/06/12 0445  WBC 15.8* 15.9*  HGB 14.1 13.1  HCT 40.2 36.3*  PLT 244 233   BMET  Recent Labs  06/06/12 0445 06/07/12 0645  NA 136 135  K 3.9 3.9  CL 102 102  CO2 26 25  GLUCOSE 109* 142*  BUN 10 12  CREATININE 0.81 0.70  CALCIUM 8.2* 8.2*   PT/INR No results found for this basename: LABPROT, INR,  in the last 72 hours ABG No results found for this basename: PHART, PCO2, PO2, HCO3,  in the last 72 hours  Studies/Results: No results found.  Anti-infectives: Anti-infectives   Start     Dose/Rate Route Frequency Ordered Stop   05/31/12 1400  ciprofloxacin (CIPRO) IVPB 400 mg     400 mg 200 mL/hr over 60 Minutes Intravenous Every 12 hours 05/31/12 1323     05/31/12 1400  metroNIDAZOLE (FLAGYL) IVPB 500 mg     500 mg 100 mL/hr over 60 Minutes Intravenous Every 8 hours 05/31/12 1323        Assessment/Plan: 1. Pancreatitis, some necrosis seen on MRI  Plan:  1. TFs post-pyloric 2. Pancreatitis seems to be improving. No need for surgical intervention at this time. We will re-check next week, please call if concerns arise in the interim  LOS: 8 days    Jacere Pangborn E 06/07/2012

## 2012-06-07 NOTE — Progress Notes (Signed)
     [facility] Daily Rounding Note 06/07/2012, 8:56 AM  SUBJECTIVE:       5/10 pain.  Not using PCA pump as often.  Flatus but not much stool.  No nausea.  Urinating well.  Has been  Walking in the room, not in hall.   OBJECTIVE:         Vital signs in last 24 hours:    Temp:  [97.2 F (36.2 C)-98.9 F (37.2 C)] 97.7 F (36.5 C) (03/28 0743) Pulse Rate:  [90-124] 92 (03/28 0743) Resp:  [13-22] 19 (03/28 0743) BP: (102-133)/(59-86) 117/71 mmHg (03/28 0743) SpO2:  [94 %-97 %] 97 % (03/28 0743) Weight:  [88.1 kg (194 lb 3.6 oz)] 88.1 kg (194 lb 3.6 oz) (03/28 0417) Last BM Date: 06/06/12 General: looks well.     Heart: RRR.   Chest: clear B  Abdomen: tense, distended.  Active BS.  Slight tenderness in RUQ  Extremities: no CCE Neuro/Psych:  Pleasant, oriented fully, appropriate.   Intake/Output from previous day: 03/27 0701 - 03/28 0700 In: 5300 [P.O.:30; I.V.:3600; NG/GT:1120; IV Piggyback:550] Out: 0   Intake/Output this shift: Total I/O In: 220 [I.V.:150; NG/GT:70] Out: -   Lab Results:  Recent Labs  06/05/12 0425 06/06/12 0445  WBC 15.8* 15.9*  HGB 14.1 13.1  HCT 40.2 36.3*  PLT 244 233   BMET  Recent Labs  06/05/12 0425 06/06/12 0445 06/07/12 0645  NA 133* 136 135  K 3.6 3.9 3.9  CL 98 102 102  CO2 22 26 25   GLUCOSE 119* 109* 142*  BUN 7 10 12   CREATININE 0.70 0.81 0.70  CALCIUM 8.6 8.2* 8.2*   LFT  Recent Labs  06/06/12 0445  PROT 6.1  ALBUMIN 2.4*  AST 10  ALT 40  ALKPHOS 173*  BILITOT 0.5    ASSESMENT: * Acute on chronic, recurrent pancreatitis. ? Intrinsic vs extrinsic CBD narrowing. 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   Surgery does not see a role in setting of sterile necrosis.    LFTs steadily improving. WBCs stable, no fever, day 7 Cipro/Flagyl.  Renal function not compromised. Tolerating tube feeds.  * SIRS, resolved.  * Tachycardia. Improved. Not on beta blocker.     PLAN: *  Allow clear trays but continue  the tube feeds.  Add po prn narcotics of Percocet, if this manages pain effectively can liberate from PCA pump.  IVF to 50 ml/hour    LOS: 8 days   Justin [name]  06/07/2012, 8:56 AM Pager: [phone]  GI ATTENDING  History, laboratories reviewed. Patient seen and examined. Wife in room. Patient is doing well. Increased ambulation. Tolerated liquid diet. At this point I would move into a non-telemetry area. Continue tube feeds the weekend with supplemental by mouth liquids. GI will see him again on Monday. Contact us in the interim if there are any questions or problems. Thanks  Justin Bonito. Eda Keys., M.D. [facility] Division of Gastroenterology

## 2012-06-08 LAB — BASIC METABOLIC PANEL
Calcium: 8.4 mg/dL (ref 8.4–10.5)
GFR calc Af Amer: >90 mL/min (ref >90)
GFR calc non Af Amer: >90 mL/min (ref >90)
Potassium: 3.9 mEq/L (ref 3.5–5.1)
Sodium: 134 mEq/L — ABNORMAL LOW (ref 135–145)

## 2012-06-08 LAB — CBC
Hemoglobin: 12 g/dL — ABNORMAL LOW (ref 13.0–17.0)
MCHC: 35.1 g/dL (ref 30.0–36.0)
Platelets: 277 10*3/uL (ref 150–400)
RDW: 15.6 % — ABNORMAL HIGH (ref 11.5–15.5)

## 2012-06-08 MED ORDER — METOPROLOL TARTRATE 25 MG/10 ML ORAL SUSPENSION
25.0000 mg | Freq: Two times a day (BID) | ORAL | Status: DC
  Filled 2012-06-08 (×2): qty 10

## 2012-06-08 MED ORDER — METOPROLOL TARTRATE 25 MG/10 ML ORAL SUSPENSION
25.0000 mg | Freq: Two times a day (BID) | ORAL | Status: DC
  Administered 2012-06-08 – 2012-06-13 (×10): 25 mg via ORAL
  Filled 2012-06-08 (×14): qty 10

## 2012-06-08 MED ORDER — HYDRALAZINE HCL 20 MG/ML IJ SOLN: 10.0000 mg | Freq: Four times a day (QID) | INTRAMUSCULAR | Status: DC | PRN

## 2012-06-08 NOTE — Progress Notes (Signed)
PATIENT DETAILS Name: Justin [name] Age: 57 y.o. Sex: male Date of Birth: Jun 07, 1955 Admit Date: 05/30/2012 Admitting Physician Vania Rea, MD PCP:No primary provider on file.  Subjective: Denies any major complaints- tolerating liquids  Assessment/Plan: Active Problems: Severe necrotizing pancreatitis - Etiology not ready evident-question of either a distal CBD stone (felt unlikely ) or a biliary stricture- current plans are for continued conservative management-and EUS when much more stable - Did have significant LFT elevation on admission, however they have now trended down and have almost normalized - GIM CCS following - NG tube with post pyloric tube feeding has been started, this has been well-tolerated. Clear liquid diet has also been started and patient seems to have tolerated this as well - WBC almost normalized as well, patient remains afebrile with empiric ciprofloxacin and Flagyl - Continue with PCA morphine  Systemic inflammation response syndrome - Secondary to above - This has resolved with pancreatitis slowly resolving - Currently on normal saline at 50 cc an hour-will slowly taper off  Hypokalemia - Resolved - Monitor electrolytes closely  Hypophosphatemia - Resolved - Monitor electrolytes  Hypertension - Currently on scheduled IV metoprolol and IV hydralazine-will stop and start metoprolol orally-we'll use IV hydralazine as needed  Dyslipidemia - Holding statins for now- resume when able or discharge  History of coronary artery disease status post CABG - Currently stable - Resume aspirin over the course of the next few days- did have pancreatic hemorrhage seen on MRCP   Hypothyroidism - Continue with Synthroid  Fibromyalgia  Well compensated at this time  Depression - Resume Cymbalta  Disposition: Remain inpatient  DVT Prophylaxis: Prophylactic Lovenox  Code Status: Full code   Procedures:  None  CONSULTS:  GI and general  surgery  PHYSICAL EXAM: Vital signs in last 24 hours: Filed Vitals:   06/08/12 0527 06/08/12 1006 06/08/12 1240 06/08/12 1454  BP: 117/68   116/70  Pulse: 97   84  Temp: 98.1 F (36.7 C)   97.6 F (36.4 C)  TempSrc: Oral   Axillary  Resp: 16 18 18 18   Height:      Weight: 82.3 kg (181 lb 7 oz)     SpO2: 95% 90% 92% 97%    Weight change: -5.9 kg (-13 lb 0.1 oz) Body mass index is 28.85 kg/(m^2).   Gen Exam: Awake and alert with clear speech.   Neck: Supple, No JVD.   Chest: B/L Clear.   CVS: S1 S2 Regular, no murmurs.  Abdomen: soft, BS +, non tender, non distended.  Extremities: no edema, lower extremities warm to touch. Neurologic: Non Focal.   Skin: No Rash.   Wounds: N/A.    Intake/Output from previous day:  Intake/Output Summary (Last 24 hours) at 06/08/12 1501 Last data filed at 06/08/12 0600  Gross per 24 hour  Intake   1860 ml  Output   1500 ml  Net    360 ml     LAB RESULTS: CBC  Recent Labs Lab 06/03/12 0533 06/04/12 0450 06/05/12 0425 06/06/12 0445 06/08/12 0600  WBC 11.0* 13.3* 15.8* 15.9* 11.2*  HGB 12.5* 14.0 14.1 13.1 12.0*  HCT 36.8* 39.0 40.2 36.3* 34.2*  PLT 148* 193 244 233 277  MCV 84.0 81.9 83.2 81.8 82.2  MCH 28.5 29.4 29.2 29.5 28.8  MCHC 34.0 35.9 35.1 36.1* 35.1  RDW 14.6 14.8 14.8 15.1 15.6*    Chemistries   Recent Labs Lab 06/03/12 0533 06/04/12 0450 06/05/12 0425 06/06/12 0445 06/07/12 0645 06/08/12 0600  NA 136 135 133* 136 135 134*  K 3.4* 3.7 3.6 3.9 3.9 3.9  CL 103 101 98 102 102 99  CO2 25 25 22 26 25 25   GLUCOSE 82 88 119* 109* 142* 169*  BUN 7 7 7 10 12 13   CREATININE 0.67 0.73 0.70 0.81 0.70 0.71  CALCIUM 8.3* 8.7 8.6 8.2* 8.2* 8.4  MG 1.9 2.0 2.0  --  2.0  --     CBG: No results found for this basename: GLUCAP,  in the last 168 hours  GFR Estimated Creatinine Clearance: 103.6 ml/min (by C-G formula based on Cr of 0.71).  Coagulation profile No results found for this basename: INR, PROTIME,   in the last 168 hours  Cardiac Enzymes No results found for this basename: CK, CKMB, TROPONINI, MYOGLOBIN,  in the last 168 hours  No components found with this basename: POCBNP,  No results found for this basename: DDIMER,  in the last 72 hours No results found for this basename: HGBA1C,  in the last 72 hours No results found for this basename: CHOL, HDL, LDLCALC, TRIG, CHOLHDL, LDLDIRECT,  in the last 72 hours No results found for this basename: TSH, T4TOTAL, FREET3, T3FREE, THYROIDAB,  in the last 72 hours No results found for this basename: VITAMINB12, FOLATE, FERRITIN, TIBC, IRON, RETICCTPCT,  in the last 72 hours No results found for this basename: LIPASE, AMYLASE,  in the last 72 hours  Urine Studies No results found for this basename: UACOL, UAPR, USPG, UPH, UTP, UGL, UKET, UBIL, UHGB, UNIT, UROB, ULEU, UEPI, UWBC, URBC, UBAC, CAST, CRYS, UCOM, BILUA,  in the last 72 hours  MICROBIOLOGY: Recent Results (from the past 240 hour(s))  MRSA PCR SCREENING     Status: None   Collection Time    06/01/12  3:00 PM      Result Value Range Status   MRSA by PCR NEGATIVE  NEGATIVE Final   Comment:            The GeneXpert MRSA Assay (FDA     approved for NASAL specimens     only), is one component of a     comprehensive MRSA colonization     surveillance program. It is not     intended to diagnose MRSA     infection nor to guide or     monitor treatment for     MRSA infections.    RADIOLOGY STUDIES/RESULTS: Dg Abd 1 View  06/04/2012  *RADIOLOGY REPORT*  Clinical Data: Feeding tube placement  ABDOMEN - 1 VIEW  Comparison: None.  Fluoroscopy time: 18 seconds  Findings: Weighted feeding tube has been placed in the proximal duodenum just past the ligament of Treitz.  IMPRESSION: Weighted feeding tube terminates in the proximal duodenum.   Original Report Authenticated By: Charline Bills, M.D.    Ct Abdomen Pelvis W Contrast  05/31/2012  *RADIOLOGY REPORT*  Clinical Data: Acute  pancreatitis, mid abdominal pain  CT ABDOMEN AND PELVIS WITH CONTRAST  Technique:  Multidetector CT imaging of the abdomen and pelvis was performed following the standard protocol during bolus administration of intravenous contrast.  Contrast: 50mL OMNIPAQUE IOHEXOL 300 MG/ML  SOLN, 1 OMNIPAQUE IOHEXOL 300 MG/ML  SOLN, OMNIPAQUE IOHEXOL 300 MG/ML  SOLN  Comparison: Chest and abdomen films of 05/30/2012  Findings: There is bibasilar linear atelectasis or scarring present.  No effusion is seen. There does appear to be a small hiatal hernia present.  The liver enhances and there is prominence of the  central intrahepatic ducts.  Surgical clips are present from prior cholecystectomy.  However there is marked edema of the neck and proximal body of the pancreas with considerable peripancreatic exudate consistent with acute pancreatitis.  The common bile duct is somewhat prominent measuring 9 mm in diameter but no definite calculus is seen. Stricture or mass cannot be excluded although no obvious mass is evident.  The fact that the head of the pancreatic parenchyma enhances as does the distal body and tail, but there is no enhancement of the distal head and proximal body is worrisome for pancreatic necrosis.  There is edema of the adjacent descending duodenum and distal stomach.  No discrete abscess or pseudocyst is evident.  The adrenal glands and spleen are unremarkable.  The stomach is moderately distended with contrast with no gross abnormality other than the previously noted edema.  The kidneys enhance with several small nonobstructing renal calculi noted bilaterally.  The abdominal aorta is normal in caliber.  No adenopathy is seen.  Pancreatic exudate extends into the right retroperitoneum.  There is also a moderate amount of free fluid noted layering within the pelvis.  The urinary bladder is unremarkable.  The prostate is normal in size.  Scattered rectosigmoid colonic diverticula are seen.  No abnormality of  the terminal ileum or appendix is noted. Degenerative change is noted involving the facet joints of L4-5 and L5-S1.  IMPRESSION:  1.  Acute pancreatitis with edema of the distal head and proximal body the pancreas and considerable surrounding exudate extending into the pelvis.  The lack of enhancement of the distal head and proximal body the pancreas is worrisome for pancreatic necrosis. No abscess or pseudocyst is evident currently. 2.  Prominent common bile duct and intrahepatic ducts may be due to edema but a non-visualized distal calculus, stricture or mass cannot be excluded as the etiology. 3.  Moderate amount of free fluid in the pelvis. 4.  Small hiatal hernia. 5.  Small nonobstructing bilateral renal calculi.   Original Report Authenticated By: Dwyane Dee, M.D.    Mr 3d Recon At Scanner  06/02/2012  *RADIOLOGY REPORT*  Clinical Data:  Pancreatitis and biliary dilatation.  Elevated liver function tests.  MRI ABDOMEN WITHOUT AND WITH CONTRAST (INCLUDING MRCP)  Technique:  Multiplanar multisequence MR imaging of the abdomen was performed both before and after the administration of intravenous contrast. Heavily T2-weighted images of the biliary and pancreatic ducts were obtained, and three-dimensional MRCP images were rendered by post processing.  Contrast: 20 ml Multihance  Comparison:  05/31/2012  Findings:  Despite efforts by the patient and technologist, motion artifact is present on some series of today's examination and could not be totally eliminated.  This reduces diagnostic sensitivity and specificity.  Trace bilateral pleural effusions noted with mesenteric edema and mild perihepatic and perisplenic ascites.  Edema tracks within and around the pancreas, compatible with pancreatitis.  Gallbladder absent.  The common bile duct measures a maximum of 6 mm on today's examination.  On image 15 of series 3, the portion of the common bile duct adjacent to the pancreatic head is somewhat irregular, and  there is an appearance of abrupt truncation of the CBD adjacent to the ampulla on that same image. This abrupt truncation is shown on the T2-weighted images, but on the postcontrast images such as images 66-77 of series 1504, there appears to be a focal stricturing or extrinsic narrowing of the CBD but with a small channel communicating with the ampulla  Abnormal high precontrast T1  signal in the pancreatic body is shown on images 48-68 of series 1500, compatible with hemorrhage. This corresponds to a lack of enhancement in the pancreatic body highly concerning for pancreatic necrosis, as noted on the prior CT scan.  IMPRESSION: 1.  The common bile duct currently only measures 6 mm in diameter, but there is focal irregular narrowing in the CBD along the pancreatic head, and also focal narrowing in the distal CBD in the vicinity of the ampulla. Loss of the normal conical distal CBD tapering.  T2-weighted images suggest an abrupt truncation of the distal-most CBD as can be seen with obstructing stone, but the postcontrast images seem to demonstrate a small channel connecting through to the ampulla raising the possibility that the distal CBD stenosis may be from stricturing or extrinsic compression related to the underlying pancreatitis.  Differentiation is not helped by the degree of motion artifact. 2.  Pancreatic necrosis of the pancreatic body, with associated hemorrhage in this portion of the pancreas.  3.  Trace bilateral pleural effusions with adjacent atelectasis; mesenteric edema along with perihepatic and perisplenic ascites. Peripancreatic edema pattern with acute pancreatitis.   Original Report Authenticated By: Gaylyn Rong, M.D.    Dg Abd Acute W/chest  05/30/2012  *RADIOLOGY REPORT*  Clinical Data: Abdominal pain and vomiting.  ACUTE ABDOMEN SERIES (ABDOMEN 2 VIEW & CHEST 1 VIEW)  Comparison: 12/05/2008  Findings: Lungs are clear. Heart and mediastinum are within normal limits.  Trachea is  midline.  No evidence of free air.  Nonspecific bowel gas pattern.  Surgical clips in the right upper abdomen. Degenerative changes in the lower lumbar spine.  Phleboliths in the pelvis.  IMPRESSION: No acute findings.   Original Report Authenticated By: Richarda Overlie, M.D.    Dg Naso G Tube Plc W/fl-no Rad  06/04/2012  CLINICAL DATA:    NASO G TUBE PLACEMENT WITH FLUORO  Fluoroscopy was utilized by the requesting physician.  No radiographic  interpretation.     Mr Abd W/wo Cm/mrcp  06/02/2012  *RADIOLOGY REPORT*  Clinical Data:  Pancreatitis and biliary dilatation.  Elevated liver function tests.  MRI ABDOMEN WITHOUT AND WITH CONTRAST (INCLUDING MRCP)  Technique:  Multiplanar multisequence MR imaging of the abdomen was performed both before and after the administration of intravenous contrast. Heavily T2-weighted images of the biliary and pancreatic ducts were obtained, and three-dimensional MRCP images were rendered by post processing.  Contrast: 20 ml Multihance  Comparison:  05/31/2012  Findings:  Despite efforts by the patient and technologist, motion artifact is present on some series of today's examination and could not be totally eliminated.  This reduces diagnostic sensitivity and specificity.  Trace bilateral pleural effusions noted with mesenteric edema and mild perihepatic and perisplenic ascites.  Edema tracks within and around the pancreas, compatible with pancreatitis.  Gallbladder absent.  The common bile duct measures a maximum of 6 mm on today's examination.  On image 15 of series 3, the portion of the common bile duct adjacent to the pancreatic head is somewhat irregular, and there is an appearance of abrupt truncation of the CBD adjacent to the ampulla on that same image. This abrupt truncation is shown on the T2-weighted images, but on the postcontrast images such as images 66-77 of series 1504, there appears to be a focal stricturing or extrinsic narrowing of the CBD but with a small channel  communicating with the ampulla  Abnormal high precontrast T1 signal in the pancreatic body is shown on images 48-68 of series 1500, compatible  with hemorrhage. This corresponds to a lack of enhancement in the pancreatic body highly concerning for pancreatic necrosis, as noted on the prior CT scan.  IMPRESSION: 1.  The common bile duct currently only measures 6 mm in diameter, but there is focal irregular narrowing in the CBD along the pancreatic head, and also focal narrowing in the distal CBD in the vicinity of the ampulla. Loss of the normal conical distal CBD tapering.  T2-weighted images suggest an abrupt truncation of the distal-most CBD as can be seen with obstructing stone, but the postcontrast images seem to demonstrate a small channel connecting through to the ampulla raising the possibility that the distal CBD stenosis may be from stricturing or extrinsic compression related to the underlying pancreatitis.  Differentiation is not helped by the degree of motion artifact. 2.  Pancreatic necrosis of the pancreatic body, with associated hemorrhage in this portion of the pancreas.  3.  Trace bilateral pleural effusions with adjacent atelectasis; mesenteric edema along with perihepatic and perisplenic ascites. Peripancreatic edema pattern with acute pancreatitis.   Original Report Authenticated By: Gaylyn Rong, M.D.    US Abdomen Limited Ruq  05/31/2012  *RADIOLOGY REPORT*  Clinical Data: Pancreatitis.  Elevated LFTs.  LIMITED ABDOMINAL ULTRASOUND  Comparison:  05/31/2012 CT.  Findings:  Gallbladder:  Post cholecystectomy.  Common bile duct:  Only the proximal aspect is well delineated secondary to bowel gas with the common bile duct measuring up to 9.3 mm.  Mid to distal common bile duct stone cannot be excluded based on present exam.  Liver:  Dilated intrahepatic biliary ducts.  Free fluid noted.  Limit evaluation of the pancreas.  Please see recent CT report.  IMPRESSION: Post cholecystectomy.   Dilated proximal common bile duct measuring up to 9.3 mm.   Mid to distal common bile duct not visualized secondary to bowel gas, mid to distal common bile duct stone can not be excluded.  Dilated intrahepatic biliary ducts.   Original Report Authenticated By: Lacy Duverney, M.D.     MEDICATIONS: Scheduled Meds: . antiseptic oral rinse  15 mL Mouth Rinse q12n4p  . chlorhexidine  15 mL Mouth Rinse BID  . ciprofloxacin  400 mg Intravenous Q12H  . DULoxetine  60 mg Oral Daily  . enoxaparin (LOVENOX) injection  40 mg Subcutaneous Q24H  . hydrALAZINE  10 mg Intravenous Q6H  . levothyroxine  100 mcg Oral QAC breakfast  . metoprolol  10 mg Intravenous Q6H  . metronidazole  500 mg Intravenous Q8H  . morphine   Intravenous Q4H  . sodium chloride  3 mL Intravenous Q12H   Continuous Infusions: . 0.9 % NaCl with KCl 20 mEq / L 50 mL/hr at 06/07/12 2226  . feeding supplement (VITAL AF 1.2 CAL) 1,000 mL (06/08/12 0556)   PRN Meds:.bisacodyl, diphenhydrAMINE, diphenhydrAMINE, naloxone, ondansetron (ZOFRAN) IV, ondansetron (ZOFRAN) IV, oxyCODONE-acetaminophen, sodium chloride  Antibiotics: Anti-infectives   Start     Dose/Rate Route Frequency Ordered Stop   05/31/12 1400  ciprofloxacin (CIPRO) IVPB 400 mg     400 mg 200 mL/hr over 60 Minutes Intravenous Every 12 hours 05/31/12 1323     05/31/12 1400  metroNIDAZOLE (FLAGYL) IVPB 500 mg     500 mg 100 mL/hr over 60 Minutes Intravenous Every 8 hours 05/31/12 1323         Jeoffrey Massed, MD  [facility] Pager:[phone]  If 7PM-7AM, please contact night-coverage [email or web address] Password [email or web address] 06/08/2012, 3:02 PM   LOS: 9 days

## 2012-06-09 ENCOUNTER — Inpatient Hospital Stay (HOSPITAL_COMMUNITY): Payer: Medicare Other

## 2012-06-09 LAB — COMPREHENSIVE METABOLIC PANEL
ALT: 28 U/L (ref 0–53)
AST: 12 U/L (ref 0–37)
Alkaline Phosphatase: 161 U/L — ABNORMAL HIGH (ref 39–117)
CO2: 25 mEq/L (ref 19–32)
GFR calc Af Amer: >90 mL/min (ref >90)
GFR calc non Af Amer: >90 mL/min (ref >90)
Glucose, Bld: 111 mg/dL — ABNORMAL HIGH (ref 70–99)
Potassium: 4.2 mEq/L (ref 3.5–5.1)
Sodium: 135 mEq/L (ref 135–145)

## 2012-06-09 LAB — CBC
Hemoglobin: 12.2 g/dL — ABNORMAL LOW (ref 13.0–17.0)
Platelets: 302 10*3/uL (ref 150–400)
RBC: 4.43 MIL/uL (ref 4.22–5.81)
WBC: 12.9 10*3/uL — ABNORMAL HIGH (ref 4.0–10.5)

## 2012-06-09 MED ORDER — POLYETHYLENE GLYCOL 3350 17 G PO PACK
17.0000 g | PACK | Freq: Two times a day (BID) | ORAL | Status: DC
  Administered 2012-06-09 – 2012-06-10 (×3): 17 g via ORAL
  Filled 2012-06-09 (×4): qty 1

## 2012-06-09 MED ORDER — SENNOSIDES 8.8 MG/5ML PO SYRP: 10.0000 mL | ORAL_SOLUTION | Freq: Two times a day (BID) | ORAL | Status: DC

## 2012-06-09 MED ORDER — SENNOSIDES 8.8 MG/5ML PO SYRP
10.0000 mL | ORAL_SOLUTION | Freq: Two times a day (BID) | ORAL | Status: DC
  Filled 2012-06-09: qty 10

## 2012-06-09 MED ORDER — FLEET ENEMA 7-19 GM/118ML RE ENEM
1.0000 | ENEMA | Freq: Once | RECTAL | Status: AC
  Administered 2012-06-09: 1 via RECTAL
  Filled 2012-06-09: qty 1

## 2012-06-09 MED ORDER — SENNA 8.6 MG PO TABS
2.0000 | ORAL_TABLET | Freq: Every day | ORAL | Status: DC
  Administered 2012-06-09 – 2012-06-10 (×2): 17.2 mg via ORAL
  Filled 2012-06-09 (×3): qty 2

## 2012-06-09 MED ORDER — MORPHINE SULFATE 2 MG/ML IJ SOLN
2.0000 mg | INTRAMUSCULAR | Status: DC | PRN
  Administered 2012-06-09 – 2012-06-10 (×4): 2 mg via INTRAVENOUS
  Filled 2012-06-09 (×4): qty 1

## 2012-06-09 NOTE — Progress Notes (Signed)
PATIENT DETAILS Name: Justin [name] Age: 57 y.o. Sex: male Date of Birth: Mar 08, 1956 Admit Date: 05/30/2012 Admitting Physician Vania Rea, MD PCP:No primary provider on file.  Subjective: Overnight developed abdominal distention-this a.m. passing gas-abdominal distention has significantly decreased. Reportedly he claims he hasn't had a bowel movement for the past 3-4 days. X-ray of the abdomen done today shows no bowel dilatation-it showed a moderate stool burden with contrast from approximately a week ago  Assessment/Plan: Active Problems: Severe necrotizing pancreatitis - Etiology not ready evident-question of either a distal CBD stone (felt unlikely ) or a biliary stricture- current plans are for continued conservative management-and EUS when much more stable - Did have significant LFT elevation on admission, however they have now trended down and have almost normalized - GI and CCS following - NG tube with post pyloric tube feeding has been started, she was on clear liquid diet as well-but both the NG feedings and clear liquid diet have been placed on hold in light of bowel distention. Since x-ray shows stool burden-will give enemas and see if this results i resolution of some of his symptoms- who claims that his belly has gotten significantly better already. Later on during the day, we'll resume NG feedings. - WBC almost normalized as well, patient remains afebrile with empiric ciprofloxacin and Flagyl - Discontinue PCA morphine- changed to as needed IV morphine- place on bowel regimen  ?Abdominal Distention -occurred 3/9 night -this am-passing Flatus -holding NG feeds/clear liquids and getting a Abd Xray -if Xray shows stool burden will given enema -place on bowel regimen  Systemic inflammation response syndrome - Secondary to above - This has resolved with pancreatitis slowly resolving - Currently on normal saline at 50 cc an hour-will slowly taper off  Hypokalemia -  Resolved - Monitor electrolytes closely  Hypophosphatemia - Resolved - Monitor electrolytes  Hypertension - Was on scheduled IV metoprolol and IV hydralazine-have started metoprolol orally-we'll use IV hydralazine as needed. BP stable  Dyslipidemia - Holding statins for now- resume when able or discharge  History of coronary artery disease status post CABG - Currently stable - Resume aspirin over the course of the next few days- did have pancreatic hemorrhage seen on MRCP   Hypothyroidism - Continue with Synthroid  Fibromyalgia  Well compensated at this time  Depression - Resume Cymbalta  Disposition: Remain inpatient  DVT Prophylaxis: Prophylactic Lovenox  Code Status: Full code   Procedures:  None  CONSULTS:  GI and general surgery  PHYSICAL EXAM: Vital signs in last 24 hours: Filed Vitals:   06/09/12 0150 06/09/12 0400 06/09/12 0540 06/09/12 0751  BP: 121/79  138/85   Pulse: 85  88   Temp: 98.1 F (36.7 C)  97.5 F (36.4 C)   TempSrc: Oral  Oral   Resp: 18 18 18 18   Height:      Weight:   82.8 kg (182 lb 8.7 oz)   SpO2: 98% 98% 99% 92%    Weight change: 0.6 kg (1 lb 5.2 oz) Body mass index is 29.02 kg/(m^2).   Gen Exam: Awake and alert with clear speech.   Neck: Supple, No JVD.   Chest: B/L Clear.   CVS: S1 S2 Regular, no murmurs.  Abdomen: soft, BS +, non tender, I do not see any abdominal distention.  Extremities: no edema, lower extremities warm to touch. Neurologic: Non Focal.   Skin: No Rash.   Wounds: N/A.    Intake/Output from previous day:  Intake/Output Summary (Last 24 hours) at  06/09/12 1444 Last data filed at 06/09/12 0900  Gross per 24 hour  Intake 58083.33 ml  Output    300 ml  Net 57783.33 ml     LAB RESULTS: CBC  Recent Labs Lab 06/04/12 0450 06/05/12 0425 06/06/12 0445 06/08/12 0600 06/09/12 0554  WBC 13.3* 15.8* 15.9* 11.2* 12.9*  HGB 14.0 14.1 13.1 12.0* 12.2*  HCT 39.0 40.2 36.3* 34.2* 37.8*  PLT 193  244 233 277 302  MCV 81.9 83.2 81.8 82.2 85.3  MCH 29.4 29.2 29.5 28.8 27.5  MCHC 35.9 35.1 36.1* 35.1 32.3  RDW 14.8 14.8 15.1 15.6* 15.4    Chemistries   Recent Labs Lab 06/03/12 0533 06/04/12 0450 06/05/12 0425 06/06/12 0445 06/07/12 0645 06/08/12 0600 06/09/12 0554  NA 136 135 133* 136 135 134* 135  K 3.4* 3.7 3.6 3.9 3.9 3.9 4.2  CL 103 101 98 102 102 99 98  CO2 25 25 22 26 25 25 25   GLUCOSE 82 88 119* 109* 142* 169* 111*  BUN 7 7 7 10 12 13 11   CREATININE 0.67 0.73 0.70 0.81 0.70 0.71 0.64  CALCIUM 8.3* 8.7 8.6 8.2* 8.2* 8.4 8.8  MG 1.9 2.0 2.0  --  2.0  --  2.2    CBG: No results found for this basename: GLUCAP,  in the last 168 hours  GFR Estimated Creatinine Clearance: 103.9 ml/min (by C-G formula based on Cr of 0.64).  Coagulation profile No results found for this basename: INR, PROTIME,  in the last 168 hours  Cardiac Enzymes No results found for this basename: CK, CKMB, TROPONINI, MYOGLOBIN,  in the last 168 hours  No components found with this basename: POCBNP,  No results found for this basename: DDIMER,  in the last 72 hours No results found for this basename: HGBA1C,  in the last 72 hours No results found for this basename: CHOL, HDL, LDLCALC, TRIG, CHOLHDL, LDLDIRECT,  in the last 72 hours No results found for this basename: TSH, T4TOTAL, FREET3, T3FREE, THYROIDAB,  in the last 72 hours No results found for this basename: VITAMINB12, FOLATE, FERRITIN, TIBC, IRON, RETICCTPCT,  in the last 72 hours No results found for this basename: LIPASE, AMYLASE,  in the last 72 hours  Urine Studies No results found for this basename: UACOL, UAPR, USPG, UPH, UTP, UGL, UKET, UBIL, UHGB, UNIT, UROB, ULEU, UEPI, UWBC, URBC, UBAC, CAST, CRYS, UCOM, BILUA,  in the last 72 hours  MICROBIOLOGY: Recent Results (from the past 240 hour(s))  MRSA PCR SCREENING     Status: None   Collection Time    06/01/12  3:00 PM      Result Value Range Status   MRSA by PCR NEGATIVE   NEGATIVE Final   Comment:            The GeneXpert MRSA Assay (FDA     approved for NASAL specimens     only), is one component of a     comprehensive MRSA colonization     surveillance program. It is not     intended to diagnose MRSA     infection nor to guide or     monitor treatment for     MRSA infections.    RADIOLOGY STUDIES/RESULTS: Dg Abd 1 View  06/04/2012  *RADIOLOGY REPORT*  Clinical Data: Feeding tube placement  ABDOMEN - 1 VIEW  Comparison: None.  Fluoroscopy time: 18 seconds  Findings: Weighted feeding tube has been placed in the proximal duodenum just past the ligament  of Treitz.  IMPRESSION: Weighted feeding tube terminates in the proximal duodenum.   Original Report Authenticated By: Charline Bills, M.D.    Ct Abdomen Pelvis W Contrast  05/31/2012  *RADIOLOGY REPORT*  Clinical Data: Acute pancreatitis, mid abdominal pain  CT ABDOMEN AND PELVIS WITH CONTRAST  Technique:  Multidetector CT imaging of the abdomen and pelvis was performed following the standard protocol during bolus administration of intravenous contrast.  Contrast: 50mL OMNIPAQUE IOHEXOL 300 MG/ML  SOLN, 1 OMNIPAQUE IOHEXOL 300 MG/ML  SOLN, OMNIPAQUE IOHEXOL 300 MG/ML  SOLN  Comparison: Chest and abdomen films of 05/30/2012  Findings: There is bibasilar linear atelectasis or scarring present.  No effusion is seen. There does appear to be a small hiatal hernia present.  The liver enhances and there is prominence of the central intrahepatic ducts.  Surgical clips are present from prior cholecystectomy.  However there is marked edema of the neck and proximal body of the pancreas with considerable peripancreatic exudate consistent with acute pancreatitis.  The common bile duct is somewhat prominent measuring 9 mm in diameter but no definite calculus is seen. Stricture or mass cannot be excluded although no obvious mass is evident.  The fact that the head of the pancreatic parenchyma enhances as does the distal  body and tail, but there is no enhancement of the distal head and proximal body is worrisome for pancreatic necrosis.  There is edema of the adjacent descending duodenum and distal stomach.  No discrete abscess or pseudocyst is evident.  The adrenal glands and spleen are unremarkable.  The stomach is moderately distended with contrast with no gross abnormality other than the previously noted edema.  The kidneys enhance with several small nonobstructing renal calculi noted bilaterally.  The abdominal aorta is normal in caliber.  No adenopathy is seen.  Pancreatic exudate extends into the right retroperitoneum.  There is also a moderate amount of free fluid noted layering within the pelvis.  The urinary bladder is unremarkable.  The prostate is normal in size.  Scattered rectosigmoid colonic diverticula are seen.  No abnormality of the terminal ileum or appendix is noted. Degenerative change is noted involving the facet joints of L4-5 and L5-S1.  IMPRESSION:  1.  Acute pancreatitis with edema of the distal head and proximal body the pancreas and considerable surrounding exudate extending into the pelvis.  The lack of enhancement of the distal head and proximal body the pancreas is worrisome for pancreatic necrosis. No abscess or pseudocyst is evident currently. 2.  Prominent common bile duct and intrahepatic ducts may be due to edema but a non-visualized distal calculus, stricture or mass cannot be excluded as the etiology. 3.  Moderate amount of free fluid in the pelvis. 4.  Small hiatal hernia. 5.  Small nonobstructing bilateral renal calculi.   Original Report Authenticated By: Dwyane Dee, M.D.    Mr 3d Recon At Scanner  06/02/2012  *RADIOLOGY REPORT*  Clinical Data:  Pancreatitis and biliary dilatation.  Elevated liver function tests.  MRI ABDOMEN WITHOUT AND WITH CONTRAST (INCLUDING MRCP)  Technique:  Multiplanar multisequence MR imaging of the abdomen was performed both before and after the administration of  intravenous contrast. Heavily T2-weighted images of the biliary and pancreatic ducts were obtained, and three-dimensional MRCP images were rendered by post processing.  Contrast: 20 ml Multihance  Comparison:  05/31/2012  Findings:  Despite efforts by the patient and technologist, motion artifact is present on some series of today's examination and could not be totally eliminated.  This reduces diagnostic sensitivity and specificity.  Trace bilateral pleural effusions noted with mesenteric edema and mild perihepatic and perisplenic ascites.  Edema tracks within and around the pancreas, compatible with pancreatitis.  Gallbladder absent.  The common bile duct measures a maximum of 6 mm on today's examination.  On image 15 of series 3, the portion of the common bile duct adjacent to the pancreatic head is somewhat irregular, and there is an appearance of abrupt truncation of the CBD adjacent to the ampulla on that same image. This abrupt truncation is shown on the T2-weighted images, but on the postcontrast images such as images 66-77 of series 1504, there appears to be a focal stricturing or extrinsic narrowing of the CBD but with a small channel communicating with the ampulla  Abnormal high precontrast T1 signal in the pancreatic body is shown on images 48-68 of series 1500, compatible with hemorrhage. This corresponds to a lack of enhancement in the pancreatic body highly concerning for pancreatic necrosis, as noted on the prior CT scan.  IMPRESSION: 1.  The common bile duct currently only measures 6 mm in diameter, but there is focal irregular narrowing in the CBD along the pancreatic head, and also focal narrowing in the distal CBD in the vicinity of the ampulla. Loss of the normal conical distal CBD tapering.  T2-weighted images suggest an abrupt truncation of the distal-most CBD as can be seen with obstructing stone, but the postcontrast images seem to demonstrate a small channel connecting through to the  ampulla raising the possibility that the distal CBD stenosis may be from stricturing or extrinsic compression related to the underlying pancreatitis.  Differentiation is not helped by the degree of motion artifact. 2.  Pancreatic necrosis of the pancreatic body, with associated hemorrhage in this portion of the pancreas.  3.  Trace bilateral pleural effusions with adjacent atelectasis; mesenteric edema along with perihepatic and perisplenic ascites. Peripancreatic edema pattern with acute pancreatitis.   Original Report Authenticated By: Gaylyn Rong, M.D.    Dg Abd Acute W/chest  05/30/2012  *RADIOLOGY REPORT*  Clinical Data: Abdominal pain and vomiting.  ACUTE ABDOMEN SERIES (ABDOMEN 2 VIEW & CHEST 1 VIEW)  Comparison: 12/05/2008  Findings: Lungs are clear. Heart and mediastinum are within normal limits.  Trachea is midline.  No evidence of free air.  Nonspecific bowel gas pattern.  Surgical clips in the right upper abdomen. Degenerative changes in the lower lumbar spine.  Phleboliths in the pelvis.  IMPRESSION: No acute findings.   Original Report Authenticated By: Richarda Overlie, M.D.    Dg Naso G Tube Plc W/fl-no Rad  06/04/2012  CLINICAL DATA:    NASO G TUBE PLACEMENT WITH FLUORO  Fluoroscopy was utilized by the requesting physician.  No radiographic  interpretation.     Mr Abd W/wo Cm/mrcp  06/02/2012  *RADIOLOGY REPORT*  Clinical Data:  Pancreatitis and biliary dilatation.  Elevated liver function tests.  MRI ABDOMEN WITHOUT AND WITH CONTRAST (INCLUDING MRCP)  Technique:  Multiplanar multisequence MR imaging of the abdomen was performed both before and after the administration of intravenous contrast. Heavily T2-weighted images of the biliary and pancreatic ducts were obtained, and three-dimensional MRCP images were rendered by post processing.  Contrast: 20 ml Multihance  Comparison:  05/31/2012  Findings:  Despite efforts by the patient and technologist, motion artifact is present on some series  of today's examination and could not be totally eliminated.  This reduces diagnostic sensitivity and specificity.  Trace bilateral pleural effusions noted with mesenteric  edema and mild perihepatic and perisplenic ascites.  Edema tracks within and around the pancreas, compatible with pancreatitis.  Gallbladder absent.  The common bile duct measures a maximum of 6 mm on today's examination.  On image 15 of series 3, the portion of the common bile duct adjacent to the pancreatic head is somewhat irregular, and there is an appearance of abrupt truncation of the CBD adjacent to the ampulla on that same image. This abrupt truncation is shown on the T2-weighted images, but on the postcontrast images such as images 66-77 of series 1504, there appears to be a focal stricturing or extrinsic narrowing of the CBD but with a small channel communicating with the ampulla  Abnormal high precontrast T1 signal in the pancreatic body is shown on images 48-68 of series 1500, compatible with hemorrhage. This corresponds to a lack of enhancement in the pancreatic body highly concerning for pancreatic necrosis, as noted on the prior CT scan.  IMPRESSION: 1.  The common bile duct currently only measures 6 mm in diameter, but there is focal irregular narrowing in the CBD along the pancreatic head, and also focal narrowing in the distal CBD in the vicinity of the ampulla. Loss of the normal conical distal CBD tapering.  T2-weighted images suggest an abrupt truncation of the distal-most CBD as can be seen with obstructing stone, but the postcontrast images seem to demonstrate a small channel connecting through to the ampulla raising the possibility that the distal CBD stenosis may be from stricturing or extrinsic compression related to the underlying pancreatitis.  Differentiation is not helped by the degree of motion artifact. 2.  Pancreatic necrosis of the pancreatic body, with associated hemorrhage in this portion of the pancreas.  3.   Trace bilateral pleural effusions with adjacent atelectasis; mesenteric edema along with perihepatic and perisplenic ascites. Peripancreatic edema pattern with acute pancreatitis.   Original Report Authenticated By: Gaylyn Rong, M.D.    US Abdomen Limited Ruq  05/31/2012  *RADIOLOGY REPORT*  Clinical Data: Pancreatitis.  Elevated LFTs.  LIMITED ABDOMINAL ULTRASOUND  Comparison:  05/31/2012 CT.  Findings:  Gallbladder:  Post cholecystectomy.  Common bile duct:  Only the proximal aspect is well delineated secondary to bowel gas with the common bile duct measuring up to 9.3 mm.  Mid to distal common bile duct stone cannot be excluded based on present exam.  Liver:  Dilated intrahepatic biliary ducts.  Free fluid noted.  Limit evaluation of the pancreas.  Please see recent CT report.  IMPRESSION: Post cholecystectomy.  Dilated proximal common bile duct measuring up to 9.3 mm.   Mid to distal common bile duct not visualized secondary to bowel gas, mid to distal common bile duct stone can not be excluded.  Dilated intrahepatic biliary ducts.   Original Report Authenticated By: Lacy Duverney, M.D.     MEDICATIONS: Scheduled Meds: . antiseptic oral rinse  15 mL Mouth Rinse q12n4p  . chlorhexidine  15 mL Mouth Rinse BID  . ciprofloxacin  400 mg Intravenous Q12H  . DULoxetine  60 mg Oral Daily  . enoxaparin (LOVENOX) injection  40 mg Subcutaneous Q24H  . levothyroxine  100 mcg Oral QAC breakfast  . metoprolol tartrate  25 mg Oral BID  . metronidazole  500 mg Intravenous Q8H  . polyethylene glycol  17 g Oral BID  . senna  2 tablet Oral QHS  . sodium chloride  3 mL Intravenous Q12H   Continuous Infusions: . 0.9 % NaCl with KCl 20 mEq / L 50  mL/hr at 06/07/12 2226  . feeding supplement (VITAL AF 1.2 CAL) 1,000 mL (06/08/12 0556)   PRN Meds:.bisacodyl, diphenhydrAMINE, diphenhydrAMINE, hydrALAZINE, morphine injection, ondansetron (ZOFRAN) IV, ondansetron (ZOFRAN) IV,  oxyCODONE-acetaminophen  Antibiotics: Anti-infectives   Start     Dose/Rate Route Frequency Ordered Stop   05/31/12 1400  ciprofloxacin (CIPRO) IVPB 400 mg     400 mg 200 mL/hr over 60 Minutes Intravenous Every 12 hours 05/31/12 1323     05/31/12 1400  metroNIDAZOLE (FLAGYL) IVPB 500 mg     500 mg 100 mL/hr over 60 Minutes Intravenous Every 8 hours 05/31/12 1323         Jeoffrey Massed, MD  [facility] Pager:[phone]  If 7PM-7AM, please contact night-coverage [email or web address] Password [email or web address] 06/09/2012, 2:44 PM   LOS: 10 days

## 2012-06-09 NOTE — Progress Notes (Signed)
PATIENT DETAILS Name: Justin [name] Age: 57 y.o. Sex: male Date of Birth: 19-Jun-1955 Admit Date: 05/30/2012 Admitting Physician Vania Rea, MD PCP:No primary provider on file.  Subjective: Overnight developed abdominal distention-this a.m. passing gas-abdominal distention has significantly decreased. Reportedly he claims he hasn't had a bowel movement for the past 3-4 days. X-ray of the abdomen done today shows no bowel dilatation-it showed a moderate stool burden with contrast from approximately a week ago  Assessment/Plan: Active Problems: Severe necrotizing pancreatitis - Etiology not ready evident-question of either a distal CBD stone (felt unlikely ) or a biliary stricture- current plans are for continued conservative management-and EUS when much more stable - Did have significant LFT elevation on admission, however they have now trended down and have almost normalized - GI and CCS following - NG tube with post pyloric tube feeding has been started, she was on clear liquid diet as well-but both the NG feedings and clear liquid diet have been placed on hold in light of bowel distention. Since x-ray shows stool burden-will give enemas and see if this results i resolution of some of his symptoms- who claims that his belly has gotten significantly better already. Later on during the day, we'll resume NG feedings. - WBC almost normalized as well, patient remains afebrile with empiric ciprofloxacin and Flagyl - Discontinue PCA morphine- changed to as needed IV morphine- place on bowel regimen  Systemic inflammation response syndrome - Secondary to above - This has resolved with pancreatitis slowly resolving - Currently on normal saline at 50 cc an hour-will slowly taper off  Hypokalemia - Resolved - Monitor electrolytes closely  Hypophosphatemia - Resolved - Monitor electrolytes  Hypertension - Was on scheduled IV metoprolol and IV hydralazine-have started metoprolol  orally-we'll use IV hydralazine as needed. BP stable  Dyslipidemia - Holding statins for now- resume when able or discharge  History of coronary artery disease status post CABG - Currently stable - Resume aspirin over the course of the next few days- did have pancreatic hemorrhage seen on MRCP   Hypothyroidism - Continue with Synthroid  Fibromyalgia  Well compensated at this time  Depression - Resume Cymbalta  Disposition: Remain inpatient  DVT Prophylaxis: Prophylactic Lovenox  Code Status: Full code   Procedures:  None  CONSULTS:  GI and general surgery  PHYSICAL EXAM: Vital signs in last 24 hours: Filed Vitals:   06/09/12 0150 06/09/12 0400 06/09/12 0540 06/09/12 0751  BP: 121/79  138/85   Pulse: 85  88   Temp: 98.1 F (36.7 C)  97.5 F (36.4 C)   TempSrc: Oral  Oral   Resp: 18 18 18 18   Height:      Weight:   82.8 kg (182 lb 8.7 oz)   SpO2: 98% 98% 99% 92%    Weight change: 0.6 kg (1 lb 5.2 oz) Body mass index is 29.02 kg/(m^2).   Gen Exam: Awake and alert with clear speech.   Neck: Supple, No JVD.   Chest: B/L Clear.   CVS: S1 S2 Regular, no murmurs.  Abdomen: soft, BS +, non tender, I do not see any abdominal distention.  Extremities: no edema, lower extremities warm to touch. Neurologic: Non Focal.   Skin: No Rash.   Wounds: N/A.    Intake/Output from previous day:  Intake/Output Summary (Last 24 hours) at 06/09/12 1102 Last data filed at 06/09/12 0900  Gross per 24 hour  Intake 58203.33 ml  Output    300 ml  Net 57903.33 ml  LAB RESULTS: CBC  Recent Labs Lab 06/04/12 0450 06/05/12 0425 06/06/12 0445 06/08/12 0600 06/09/12 0554  WBC 13.3* 15.8* 15.9* 11.2* 12.9*  HGB 14.0 14.1 13.1 12.0* 12.2*  HCT 39.0 40.2 36.3* 34.2* 37.8*  PLT 193 244 233 277 302  MCV 81.9 83.2 81.8 82.2 85.3  MCH 29.4 29.2 29.5 28.8 27.5  MCHC 35.9 35.1 36.1* 35.1 32.3  RDW 14.8 14.8 15.1 15.6* 15.4    Chemistries   Recent Labs Lab  06/03/12 0533 06/04/12 0450 06/05/12 0425 06/06/12 0445 06/07/12 0645 06/08/12 0600 06/09/12 0554  NA 136 135 133* 136 135 134* 135  K 3.4* 3.7 3.6 3.9 3.9 3.9 4.2  CL 103 101 98 102 102 99 98  CO2 25 25 22 26 25 25 25   GLUCOSE 82 88 119* 109* 142* 169* 111*  BUN 7 7 7 10 12 13 11   CREATININE 0.67 0.73 0.70 0.81 0.70 0.71 0.64  CALCIUM 8.3* 8.7 8.6 8.2* 8.2* 8.4 8.8  MG 1.9 2.0 2.0  --  2.0  --  2.2    CBG: No results found for this basename: GLUCAP,  in the last 168 hours  GFR Estimated Creatinine Clearance: 103.9 ml/min (by C-G formula based on Cr of 0.64).  Coagulation profile No results found for this basename: INR, PROTIME,  in the last 168 hours  Cardiac Enzymes No results found for this basename: CK, CKMB, TROPONINI, MYOGLOBIN,  in the last 168 hours  No components found with this basename: POCBNP,  No results found for this basename: DDIMER,  in the last 72 hours No results found for this basename: HGBA1C,  in the last 72 hours No results found for this basename: CHOL, HDL, LDLCALC, TRIG, CHOLHDL, LDLDIRECT,  in the last 72 hours No results found for this basename: TSH, T4TOTAL, FREET3, T3FREE, THYROIDAB,  in the last 72 hours No results found for this basename: VITAMINB12, FOLATE, FERRITIN, TIBC, IRON, RETICCTPCT,  in the last 72 hours No results found for this basename: LIPASE, AMYLASE,  in the last 72 hours  Urine Studies No results found for this basename: UACOL, UAPR, USPG, UPH, UTP, UGL, UKET, UBIL, UHGB, UNIT, UROB, ULEU, UEPI, UWBC, URBC, UBAC, CAST, CRYS, UCOM, BILUA,  in the last 72 hours  MICROBIOLOGY: Recent Results (from the past 240 hour(s))  MRSA PCR SCREENING     Status: None   Collection Time    06/01/12  3:00 PM      Result Value Range Status   MRSA by PCR NEGATIVE  NEGATIVE Final   Comment:            The GeneXpert MRSA Assay (FDA     approved for NASAL specimens     only), is one component of a     comprehensive MRSA colonization      surveillance program. It is not     intended to diagnose MRSA     infection nor to guide or     monitor treatment for     MRSA infections.    RADIOLOGY STUDIES/RESULTS: Dg Abd 1 View  06/04/2012  *RADIOLOGY REPORT*  Clinical Data: Feeding tube placement  ABDOMEN - 1 VIEW  Comparison: None.  Fluoroscopy time: 18 seconds  Findings: Weighted feeding tube has been placed in the proximal duodenum just past the ligament of Treitz.  IMPRESSION: Weighted feeding tube terminates in the proximal duodenum.   Original Report Authenticated By: Charline Bills, M.D.    Ct Abdomen Pelvis W Contrast  05/31/2012  *  RADIOLOGY REPORT*  Clinical Data: Acute pancreatitis, mid abdominal pain  CT ABDOMEN AND PELVIS WITH CONTRAST  Technique:  Multidetector CT imaging of the abdomen and pelvis was performed following the standard protocol during bolus administration of intravenous contrast.  Contrast: 50mL OMNIPAQUE IOHEXOL 300 MG/ML  SOLN, 1 OMNIPAQUE IOHEXOL 300 MG/ML  SOLN, OMNIPAQUE IOHEXOL 300 MG/ML  SOLN  Comparison: Chest and abdomen films of 05/30/2012  Findings: There is bibasilar linear atelectasis or scarring present.  No effusion is seen. There does appear to be a small hiatal hernia present.  The liver enhances and there is prominence of the central intrahepatic ducts.  Surgical clips are present from prior cholecystectomy.  However there is marked edema of the neck and proximal body of the pancreas with considerable peripancreatic exudate consistent with acute pancreatitis.  The common bile duct is somewhat prominent measuring 9 mm in diameter but no definite calculus is seen. Stricture or mass cannot be excluded although no obvious mass is evident.  The fact that the head of the pancreatic parenchyma enhances as does the distal body and tail, but there is no enhancement of the distal head and proximal body is worrisome for pancreatic necrosis.  There is edema of the adjacent descending duodenum and distal  stomach.  No discrete abscess or pseudocyst is evident.  The adrenal glands and spleen are unremarkable.  The stomach is moderately distended with contrast with no gross abnormality other than the previously noted edema.  The kidneys enhance with several small nonobstructing renal calculi noted bilaterally.  The abdominal aorta is normal in caliber.  No adenopathy is seen.  Pancreatic exudate extends into the right retroperitoneum.  There is also a moderate amount of free fluid noted layering within the pelvis.  The urinary bladder is unremarkable.  The prostate is normal in size.  Scattered rectosigmoid colonic diverticula are seen.  No abnormality of the terminal ileum or appendix is noted. Degenerative change is noted involving the facet joints of L4-5 and L5-S1.  IMPRESSION:  1.  Acute pancreatitis with edema of the distal head and proximal body the pancreas and considerable surrounding exudate extending into the pelvis.  The lack of enhancement of the distal head and proximal body the pancreas is worrisome for pancreatic necrosis. No abscess or pseudocyst is evident currently. 2.  Prominent common bile duct and intrahepatic ducts may be due to edema but a non-visualized distal calculus, stricture or mass cannot be excluded as the etiology. 3.  Moderate amount of free fluid in the pelvis. 4.  Small hiatal hernia. 5.  Small nonobstructing bilateral renal calculi.   Original Report Authenticated By: Dwyane Dee, M.D.    Mr 3d Recon At Scanner  06/02/2012  *RADIOLOGY REPORT*  Clinical Data:  Pancreatitis and biliary dilatation.  Elevated liver function tests.  MRI ABDOMEN WITHOUT AND WITH CONTRAST (INCLUDING MRCP)  Technique:  Multiplanar multisequence MR imaging of the abdomen was performed both before and after the administration of intravenous contrast. Heavily T2-weighted images of the biliary and pancreatic ducts were obtained, and three-dimensional MRCP images were rendered by post processing.  Contrast: 20  ml Multihance  Comparison:  05/31/2012  Findings:  Despite efforts by the patient and technologist, motion artifact is present on some series of today's examination and could not be totally eliminated.  This reduces diagnostic sensitivity and specificity.  Trace bilateral pleural effusions noted with mesenteric edema and mild perihepatic and perisplenic ascites.  Edema tracks within and around the pancreas, compatible with pancreatitis.  Gallbladder absent.  The common bile duct measures a maximum of 6 mm on today's examination.  On image 15 of series 3, the portion of the common bile duct adjacent to the pancreatic head is somewhat irregular, and there is an appearance of abrupt truncation of the CBD adjacent to the ampulla on that same image. This abrupt truncation is shown on the T2-weighted images, but on the postcontrast images such as images 66-77 of series 1504, there appears to be a focal stricturing or extrinsic narrowing of the CBD but with a small channel communicating with the ampulla  Abnormal high precontrast T1 signal in the pancreatic body is shown on images 48-68 of series 1500, compatible with hemorrhage. This corresponds to a lack of enhancement in the pancreatic body highly concerning for pancreatic necrosis, as noted on the prior CT scan.  IMPRESSION: 1.  The common bile duct currently only measures 6 mm in diameter, but there is focal irregular narrowing in the CBD along the pancreatic head, and also focal narrowing in the distal CBD in the vicinity of the ampulla. Loss of the normal conical distal CBD tapering.  T2-weighted images suggest an abrupt truncation of the distal-most CBD as can be seen with obstructing stone, but the postcontrast images seem to demonstrate a small channel connecting through to the ampulla raising the possibility that the distal CBD stenosis may be from stricturing or extrinsic compression related to the underlying pancreatitis.  Differentiation is not helped by the  degree of motion artifact. 2.  Pancreatic necrosis of the pancreatic body, with associated hemorrhage in this portion of the pancreas.  3.  Trace bilateral pleural effusions with adjacent atelectasis; mesenteric edema along with perihepatic and perisplenic ascites. Peripancreatic edema pattern with acute pancreatitis.   Original Report Authenticated By: Gaylyn Rong, M.D.    Dg Abd Acute W/chest  05/30/2012  *RADIOLOGY REPORT*  Clinical Data: Abdominal pain and vomiting.  ACUTE ABDOMEN SERIES (ABDOMEN 2 VIEW & CHEST 1 VIEW)  Comparison: 12/05/2008  Findings: Lungs are clear. Heart and mediastinum are within normal limits.  Trachea is midline.  No evidence of free air.  Nonspecific bowel gas pattern.  Surgical clips in the right upper abdomen. Degenerative changes in the lower lumbar spine.  Phleboliths in the pelvis.  IMPRESSION: No acute findings.   Original Report Authenticated By: Richarda Overlie, M.D.    Dg Naso G Tube Plc W/fl-no Rad  06/04/2012  CLINICAL DATA:    NASO G TUBE PLACEMENT WITH FLUORO  Fluoroscopy was utilized by the requesting physician.  No radiographic  interpretation.     Mr Abd W/wo Cm/mrcp  06/02/2012  *RADIOLOGY REPORT*  Clinical Data:  Pancreatitis and biliary dilatation.  Elevated liver function tests.  MRI ABDOMEN WITHOUT AND WITH CONTRAST (INCLUDING MRCP)  Technique:  Multiplanar multisequence MR imaging of the abdomen was performed both before and after the administration of intravenous contrast. Heavily T2-weighted images of the biliary and pancreatic ducts were obtained, and three-dimensional MRCP images were rendered by post processing.  Contrast: 20 ml Multihance  Comparison:  05/31/2012  Findings:  Despite efforts by the patient and technologist, motion artifact is present on some series of today's examination and could not be totally eliminated.  This reduces diagnostic sensitivity and specificity.  Trace bilateral pleural effusions noted with mesenteric edema and mild  perihepatic and perisplenic ascites.  Edema tracks within and around the pancreas, compatible with pancreatitis.  Gallbladder absent.  The common bile duct measures a maximum of 6 mm on  today's examination.  On image 15 of series 3, the portion of the common bile duct adjacent to the pancreatic head is somewhat irregular, and there is an appearance of abrupt truncation of the CBD adjacent to the ampulla on that same image. This abrupt truncation is shown on the T2-weighted images, but on the postcontrast images such as images 66-77 of series 1504, there appears to be a focal stricturing or extrinsic narrowing of the CBD but with a small channel communicating with the ampulla  Abnormal high precontrast T1 signal in the pancreatic body is shown on images 48-68 of series 1500, compatible with hemorrhage. This corresponds to a lack of enhancement in the pancreatic body highly concerning for pancreatic necrosis, as noted on the prior CT scan.  IMPRESSION: 1.  The common bile duct currently only measures 6 mm in diameter, but there is focal irregular narrowing in the CBD along the pancreatic head, and also focal narrowing in the distal CBD in the vicinity of the ampulla. Loss of the normal conical distal CBD tapering.  T2-weighted images suggest an abrupt truncation of the distal-most CBD as can be seen with obstructing stone, but the postcontrast images seem to demonstrate a small channel connecting through to the ampulla raising the possibility that the distal CBD stenosis may be from stricturing or extrinsic compression related to the underlying pancreatitis.  Differentiation is not helped by the degree of motion artifact. 2.  Pancreatic necrosis of the pancreatic body, with associated hemorrhage in this portion of the pancreas.  3.  Trace bilateral pleural effusions with adjacent atelectasis; mesenteric edema along with perihepatic and perisplenic ascites. Peripancreatic edema pattern with acute pancreatitis.    Original Report Authenticated By: Gaylyn Rong, M.D.    US Abdomen Limited Ruq  05/31/2012  *RADIOLOGY REPORT*  Clinical Data: Pancreatitis.  Elevated LFTs.  LIMITED ABDOMINAL ULTRASOUND  Comparison:  05/31/2012 CT.  Findings:  Gallbladder:  Post cholecystectomy.  Common bile duct:  Only the proximal aspect is well delineated secondary to bowel gas with the common bile duct measuring up to 9.3 mm.  Mid to distal common bile duct stone cannot be excluded based on present exam.  Liver:  Dilated intrahepatic biliary ducts.  Free fluid noted.  Limit evaluation of the pancreas.  Please see recent CT report.  IMPRESSION: Post cholecystectomy.  Dilated proximal common bile duct measuring up to 9.3 mm.   Mid to distal common bile duct not visualized secondary to bowel gas, mid to distal common bile duct stone can not be excluded.  Dilated intrahepatic biliary ducts.   Original Report Authenticated By: Lacy Duverney, M.D.     MEDICATIONS: Scheduled Meds: . antiseptic oral rinse  15 mL Mouth Rinse q12n4p  . chlorhexidine  15 mL Mouth Rinse BID  . ciprofloxacin  400 mg Intravenous Q12H  . DULoxetine  60 mg Oral Daily  . enoxaparin (LOVENOX) injection  40 mg Subcutaneous Q24H  . levothyroxine  100 mcg Oral QAC breakfast  . metoprolol tartrate  25 mg Oral BID  . metronidazole  500 mg Intravenous Q8H  . morphine   Intravenous Q4H  . sodium chloride  3 mL Intravenous Q12H  . sodium phosphate  1 enema Rectal Once   Continuous Infusions: . 0.9 % NaCl with KCl 20 mEq / L 50 mL/hr at 06/07/12 2226  . feeding supplement (VITAL AF 1.2 CAL) 1,000 mL (06/08/12 0556)   PRN Meds:.bisacodyl, diphenhydrAMINE, diphenhydrAMINE, hydrALAZINE, naloxone, ondansetron (ZOFRAN) IV, ondansetron (ZOFRAN) IV, oxyCODONE-acetaminophen, sodium chloride  Antibiotics: Anti-infectives   Start     Dose/Rate Route Frequency Ordered Stop   05/31/12 1400  ciprofloxacin (CIPRO) IVPB 400 mg     400 mg 200 mL/hr over 60 Minutes  Intravenous Every 12 hours 05/31/12 1323     05/31/12 1400  metroNIDAZOLE (FLAGYL) IVPB 500 mg     500 mg 100 mL/hr over 60 Minutes Intravenous Every 8 hours 05/31/12 1323         Jeoffrey Massed, MD  [facility] Pager:[phone]  If 7PM-7AM, please contact night-coverage [email or web address] Password [email or web address] 06/09/2012, 11:02 AM   LOS: 10 days

## 2012-06-10 ENCOUNTER — Inpatient Hospital Stay (HOSPITAL_COMMUNITY): Payer: Medicare Other

## 2012-06-10 ENCOUNTER — Encounter (HOSPITAL_COMMUNITY): Payer: Self-pay | Admitting: Radiology

## 2012-06-10 DIAGNOSIS — E782 Mixed hyperlipidemia: Secondary | ICD-10-CM | POA: Diagnosis present

## 2012-06-10 DIAGNOSIS — K831 Obstruction of bile duct: Secondary | ICD-10-CM | POA: Diagnosis present

## 2012-06-10 MED ORDER — HYDROMORPHONE HCL PF 1 MG/ML IJ SOLN
1.0000 mg | INTRAMUSCULAR | Status: DC | PRN
  Filled 2012-06-10: qty 1

## 2012-06-10 MED ORDER — GABAPENTIN 300 MG PO CAPS
300.0000 mg | ORAL_CAPSULE | Freq: Three times a day (TID) | ORAL | Status: DC
  Administered 2012-06-10 (×2): 300 mg via ORAL
  Filled 2012-06-10 (×5): qty 1

## 2012-06-10 MED ORDER — LIP MEDEX EX OINT: 1.0000 "application " | TOPICAL_OINTMENT | Freq: Two times a day (BID) | CUTANEOUS | Status: DC

## 2012-06-10 MED ORDER — POLYETHYLENE GLYCOL 3350 17 G PO PACK
17.0000 g | PACK | Freq: Two times a day (BID) | ORAL | Status: DC
  Filled 2012-06-10 (×3): qty 1

## 2012-06-10 MED ORDER — MAGIC MOUTHWASH
15.0000 mL | Freq: Four times a day (QID) | ORAL | Status: DC | PRN
  Filled 2012-06-10: qty 15

## 2012-06-10 MED ORDER — ATORVASTATIN CALCIUM 40 MG PO TABS
40.0000 mg | ORAL_TABLET | Freq: Every day | ORAL | Status: DC
  Administered 2012-06-10: 40 mg via ORAL
  Filled 2012-06-10 (×2): qty 1

## 2012-06-10 MED ORDER — PANTOPRAZOLE SODIUM 40 MG PO TBEC
40.0000 mg | DELAYED_RELEASE_TABLET | Freq: Every day | ORAL | Status: DC
  Administered 2012-06-10 – 2012-06-11 (×2): 40 mg via ORAL
  Filled 2012-06-10 (×2): qty 1

## 2012-06-10 MED ORDER — LIP MEDEX EX OINT
1.0000 "application " | TOPICAL_OINTMENT | Freq: Two times a day (BID) | CUTANEOUS | Status: DC
  Administered 2012-06-10 (×2): 1 via TOPICAL
  Filled 2012-06-10: qty 7

## 2012-06-10 MED ORDER — IOHEXOL 300 MG/ML  SOLN
100.0000 mL | Freq: Once | INTRAMUSCULAR | Status: AC | PRN
  Administered 2012-06-10: 80 mL via INTRAVENOUS

## 2012-06-10 MED ORDER — SACCHAROMYCES BOULARDII 250 MG PO CAPS
250.0000 mg | ORAL_CAPSULE | Freq: Two times a day (BID) | ORAL | Status: DC
  Administered 2012-06-10 – 2012-06-11 (×2): 250 mg via ORAL
  Filled 2012-06-10 (×3): qty 1

## 2012-06-10 MED ORDER — NITROGLYCERIN 0.4 MG SL SUBL: 0.4000 mg | SUBLINGUAL_TABLET | SUBLINGUAL | Status: DC | PRN

## 2012-06-10 MED ORDER — FENTANYL CITRATE 0.05 MG/ML IJ SOLN
25.0000 ug | INTRAMUSCULAR | Status: DC | PRN
  Administered 2012-06-12 – 2012-06-13 (×5): 50 ug via INTRAVENOUS
  Filled 2012-06-10 (×5): qty 2

## 2012-06-10 MED ORDER — IOHEXOL 300 MG/ML  SOLN
25.0000 mL | INTRAMUSCULAR | Status: AC
  Administered 2012-06-10 (×2): 25 mL via ORAL

## 2012-06-10 MED ORDER — OXYCODONE HCL 5 MG PO TABS
5.0000 mg | ORAL_TABLET | Freq: Four times a day (QID) | ORAL | Status: DC | PRN
  Administered 2012-06-11 – 2012-06-14 (×6): 10 mg via ORAL
  Filled 2012-06-10 (×6): qty 2

## 2012-06-10 MED ORDER — PANTOPRAZOLE SODIUM 40 MG IV SOLR: 40.0000 mg | INTRAVENOUS | Status: DC

## 2012-06-10 MED ORDER — ACETAMINOPHEN 500 MG PO TABS
1000.0000 mg | ORAL_TABLET | Freq: Three times a day (TID) | ORAL | Status: DC
  Administered 2012-06-10 – 2012-06-11 (×5): 1000 mg via ORAL
  Filled 2012-06-10 (×8): qty 2

## 2012-06-10 MED ORDER — MORPHINE SULFATE 2 MG/ML IJ SOLN
2.0000 mg | INTRAMUSCULAR | Status: DC | PRN
  Administered 2012-06-10 (×2): 2 mg via INTRAVENOUS
  Filled 2012-06-10 (×2): qty 1

## 2012-06-10 MED ORDER — BLISTEX EX OINT
TOPICAL_OINTMENT | Freq: Two times a day (BID) | CUTANEOUS | Status: DC
  Administered 2012-06-10 – 2012-06-14 (×8): via TOPICAL
  Filled 2012-06-10 (×2): qty 10

## 2012-06-10 NOTE — Progress Notes (Signed)
Patient ID: Justin [name], male   DOB: 02/16/1956, 57 y.o.   MRN: [id number]    Subjective: Pt feels better.  Had large BM yesterday and feels better.  Tolerating tube feeds.  C/o REFLUX  Objective: Vital signs in last 24 hours: Temp:  [97.4 F (36.3 C)-98.9 F (37.2 C)] 98.4 F (36.9 C) (03/31 0526) Pulse Rate:  [77-95] 86 (03/31 0526) Resp:  [18] 18 (03/31 0526) BP: (124-137)/(64-82) 137/64 mmHg (03/31 0526) SpO2:  [96 %-98 %] 97 % (03/31 0526) Weight:  [174 lb 13.2 oz (79.3 kg)] 174 lb 13.2 oz (79.3 kg) (03/31 0526) Last BM Date: 06/30/2012  Intake/Output from previous day: 07/01/2022 0701 - 03/31 0700 In: 400 [P.O.:360; NG/GT:40] Out: -  Intake/Output this shift:    PE: Abd: much softer, less tender, PANDA in place with TFs.  Lab Results:   Recent Labs  06/08/12 0600 06/30/12 0554  WBC 11.2* 12.9*  HGB 12.0* 12.2*  HCT 34.2* 37.8*  PLT 277 302   BMET  Recent Labs  06/08/12 0600 06-30-2012 0554  NA 134* 135  K 3.9 4.2  CL 99 98  CO2 25 25  GLUCOSE 169* 111*  BUN 13 11  CREATININE 0.71 0.64  CALCIUM 8.4 8.8   PT/INR No results found for this basename: LABPROT, INR,  in the last 72 hours CMP     Component Value Date/Time   NA 135 Jun 30, 2012 0554   K 4.2 30-Jun-2012 0554   CL 98 2012-06-30 0554   CO2 25 Jun 30, 2012 0554   GLUCOSE 111* 06/30/2012 0554   BUN 11 June 30, 2012 0554   CREATININE 0.64 2012-06-30 0554   CALCIUM 8.8 Jun 30, 2012 0554   PROT 6.9 June 30, 2012 0554   ALBUMIN 2.7* 06/30/2012 0554   AST 12 June 30, 2012 0554   ALT 28 30-Jun-2012 0554   ALKPHOS 161* 06-30-12 0554   BILITOT 0.4 June 30, 2012 0554   GFRNONAA >90 June 30, 2012 0554   GFRAA >90 06-30-12 0554   Lipase     Component Value Date/Time   LIPASE 222* 06/02/2012 0545       Studies/Results: Dg Abd 2 Views  06-30-2012  *RADIOLOGY REPORT*  Clinical Data: Abdominal distention.  Evaluate stool burden.  ABDOMEN - 2 VIEW  Comparison: Abdominal CT 05/31/2012  Findings: The feeding tube is in the region  of the proximal jejunum.  There is contrast throughout the colon with at least mild to moderate stool in the right colon region.  There is gas within the small bowel loops but no significant small bowel gaseous distention.  There is gas and stool in the rectum. Densities at the right lung base could represent atelectasis and cannot exclude pleural fluid.  IMPRESSION: There is oral contrast and stool within the colon.  No evidence for bowel obstruction.  Feeding tube in the jejunum.   Original Report Authenticated By: Richarda Overlie, M.D.     Anti-infectives: Anti-infectives   Start     Dose/Rate Route Frequency Ordered Stop   05/31/12 1400  ciprofloxacin (CIPRO) IVPB 400 mg     400 mg 200 mL/hr over 60 Minutes Intravenous Every 12 hours 05/31/12 1323     05/31/12 1400  metroNIDAZOLE (FLAGYL) IVPB 500 mg     500 mg 100 mL/hr over 60 Minutes Intravenous Every 8 hours 05/31/12 1323         Assessment/Plan  1. Necrotizing pancreatitis 2. TFs  Plan: 1. No surgical intervention planned, 2. Continue TFs and conservative management.   LOS: 11 days  Efrain Clauson E 06/10/2012, 8:47 AM Pager: [phone]

## 2012-06-10 NOTE — Progress Notes (Signed)
PATIENT DETAILS Name: Justin [name] Age: 57 y.o. Sex: male Date of Birth: Aug 28, 1955 Admit Date: 05/30/2012 Admitting Physician Vania Rea, MD PCP:No primary provider on file.  Subjective: Multiple bowel movements yesterday.  Abdominal distention improved today.  Asks if we will call and update his wife. (Done)  Assessment/Plan: Active Problems: Severe necrotizing pancreatitis - Etiology not ready evident-question of either a distal CBD stone (felt unlikely ) or a biliary stricture- current plans are for continued conservative management-and EUS when much more stable - Did have significant LFT elevation on admission, however they have now trended down and have almost normalized - GI and CCS following - NG tube with post pyloric tube feeding has been re- started as abdominal distention improved.  Will give sips and chips (Per wife even a bite of jello causes dry heaves & pain) - WBC almost normalized as well, patient remains afebrile with empiric ciprofloxacin and Flagyl - Pain at a 6 on two percocet.  Morphine 2 mg q 2 PRN for severe pain.  Abdominal Distention -occurred 3/9 night.  Improved after bowel movements. -place on bowel regimen -Ambulate patient. -Potassium 4.2 (on 3/31)  Systemic inflammation response syndrome - Secondary to above - This has resolved with pancreatitis slowly resolving  Hypokalemia - Resolved - Monitor electrolytes closely  Hypophosphatemia - Resolved - Monitor electrolytes  Hypertension - Was on scheduled IV metoprolol and IV hydralazine-now on metoprolol orally-we'll use IV hydralazine as needed. BP stable  Dyslipidemia - Holding statins for now- resume when able or discharge  History of coronary artery disease status post CABG - Currently stable - Resume aspirin over the course of the next few days- did have pancreatic hemorrhage seen on MRCP   Hypothyroidism - Continue with Synthroid  Fibromyalgia  -Well compensated at this  time  Depression - Resume Cymbalta  Disposition: -Remain inpatient  DVT Prophylaxis: Prophylactic Lovenox  Code Status: Full code   Procedures:  None  CONSULTS:  GI and general surgery  PHYSICAL EXAM: Vital signs in last 24 hours: Filed Vitals:   06/09/12 2110 06/10/12 0216 06/10/12 0526 06/10/12 0957  BP: 124/77 129/74 137/64 124/79  Pulse: 95 77 86 100  Temp: 98.9 F (37.2 C) 98.5 F (36.9 C) 98.4 F (36.9 C) 98.1 F (36.7 C)  TempSrc: Oral Oral Oral Oral  Resp: 18 18 18 18   Height:      Weight:   79.3 kg (174 lb 13.2 oz)   SpO2: 97% 97% 97% 99%    Weight change: -3.5 kg (-7 lb 11.5 oz) Body mass index is 27.8 kg/(m^2).   Gen Exam: Awake and alert with clear speech.   Neck: Supple, No JVD.   Chest: B/L Clear.   CVS: S1 S2 Regular, no murmurs.  Abdomen: soft, BS +, non tender, very active bowel sounds  Extremities: no edema, lower extremities warm to touch. Neurologic: Non Focal.   Skin: No Rash, bruises or lesions   Intake/Output from previous day:  Intake/Output Summary (Last 24 hours) at 06/10/12 1012 Last data filed at 06/09/12 1730  Gross per 24 hour  Intake     40 ml  Output      0 ml  Net     40 ml     LAB RESULTS: CBC  Recent Labs Lab 06/04/12 0450 06/05/12 0425 06/06/12 0445 06/08/12 0600 06/09/12 0554  WBC 13.3* 15.8* 15.9* 11.2* 12.9*  HGB 14.0 14.1 13.1 12.0* 12.2*  HCT 39.0 40.2 36.3* 34.2* 37.8*  PLT 193 244 233  277 302  MCV 81.9 83.2 81.8 82.2 85.3  MCH 29.4 29.2 29.5 28.8 27.5  MCHC 35.9 35.1 36.1* 35.1 32.3  RDW 14.8 14.8 15.1 15.6* 15.4    Chemistries   Recent Labs Lab 06/04/12 0450 06/05/12 0425 06/06/12 0445 06/07/12 0645 06/08/12 0600 06/09/12 0554  NA 135 133* 136 135 134* 135  K 3.7 3.6 3.9 3.9 3.9 4.2  CL 101 98 102 102 99 98  CO2 25 22 26 25 25 25   GLUCOSE 88 119* 109* 142* 169* 111*  BUN 7 7 10 12 13 11   CREATININE 0.73 0.70 0.81 0.70 0.71 0.64  CALCIUM 8.7 8.6 8.2* 8.2* 8.4 8.8  MG 2.0 2.0   --  2.0  --  2.2    MICROBIOLOGY: Recent Results (from the past 240 hour(s))  MRSA PCR SCREENING     Status: None   Collection Time    06/01/12  3:00 PM      Result Value Range Status   MRSA by PCR NEGATIVE  NEGATIVE Final   Comment:            The GeneXpert MRSA Assay (FDA     approved for NASAL specimens     only), is one component of a     comprehensive MRSA colonization     surveillance program. It is not     intended to diagnose MRSA     infection nor to guide or     monitor treatment for     MRSA infections.    RADIOLOGY STUDIES/RESULTS: Dg Abd 1 View  06/04/2012  *RADIOLOGY REPORT*  Clinical Data: Feeding tube placement  ABDOMEN - 1 VIEW  Comparison: None.  Fluoroscopy time: 18 seconds  Findings: Weighted feeding tube has been placed in the proximal duodenum just past the ligament of Treitz.  IMPRESSION: Weighted feeding tube terminates in the proximal duodenum.   Original Report Authenticated By: Charline Bills, M.D.    Ct Abdomen Pelvis W Contrast  05/31/2012  *RADIOLOGY REPORT*  Clinical Data: Acute pancreatitis, mid abdominal pain  CT ABDOMEN AND PELVIS WITH CONTRAST  Technique:  Multidetector CT imaging of the abdomen and pelvis was performed following the standard protocol during bolus administration of intravenous contrast.  Contrast: 50mL OMNIPAQUE IOHEXOL 300 MG/ML  SOLN, 1 OMNIPAQUE IOHEXOL 300 MG/ML  SOLN, OMNIPAQUE IOHEXOL 300 MG/ML  SOLN  Comparison: Chest and abdomen films of 05/30/2012  Findings: There is bibasilar linear atelectasis or scarring present.  No effusion is seen. There does appear to be a small hiatal hernia present.  The liver enhances and there is prominence of the central intrahepatic ducts.  Surgical clips are present from prior cholecystectomy.  However there is marked edema of the neck and proximal body of the pancreas with considerable peripancreatic exudate consistent with acute pancreatitis.  The common bile duct is somewhat prominent  measuring 9 mm in diameter but no definite calculus is seen. Stricture or mass cannot be excluded although no obvious mass is evident.  The fact that the head of the pancreatic parenchyma enhances as does the distal body and tail, but there is no enhancement of the distal head and proximal body is worrisome for pancreatic necrosis.  There is edema of the adjacent descending duodenum and distal stomach.  No discrete abscess or pseudocyst is evident.  The adrenal glands and spleen are unremarkable.  The stomach is moderately distended with contrast with no gross abnormality other than the previously noted edema.  The kidneys enhance with  several small nonobstructing renal calculi noted bilaterally.  The abdominal aorta is normal in caliber.  No adenopathy is seen.  Pancreatic exudate extends into the right retroperitoneum.  There is also a moderate amount of free fluid noted layering within the pelvis.  The urinary bladder is unremarkable.  The prostate is normal in size.  Scattered rectosigmoid colonic diverticula are seen.  No abnormality of the terminal ileum or appendix is noted. Degenerative change is noted involving the facet joints of L4-5 and L5-S1.  IMPRESSION:  1.  Acute pancreatitis with edema of the distal head and proximal body the pancreas and considerable surrounding exudate extending into the pelvis.  The lack of enhancement of the distal head and proximal body the pancreas is worrisome for pancreatic necrosis. No abscess or pseudocyst is evident currently. 2.  Prominent common bile duct and intrahepatic ducts may be due to edema but a non-visualized distal calculus, stricture or mass cannot be excluded as the etiology. 3.  Moderate amount of free fluid in the pelvis. 4.  Small hiatal hernia. 5.  Small nonobstructing bilateral renal calculi.   Original Report Authenticated By: Dwyane Dee, M.D.    Mr 3d Recon At Scanner  06/02/2012  *RADIOLOGY REPORT*  Clinical Data:  Pancreatitis and biliary  dilatation.  Elevated liver function tests.  MRI ABDOMEN WITHOUT AND WITH CONTRAST (INCLUDING MRCP)  Technique:  Multiplanar multisequence MR imaging of the abdomen was performed both before and after the administration of intravenous contrast. Heavily T2-weighted images of the biliary and pancreatic ducts were obtained, and three-dimensional MRCP images were rendered by post processing.  Contrast: 20 ml Multihance  Comparison:  05/31/2012  Findings:  Despite efforts by the patient and technologist, motion artifact is present on some series of today's examination and could not be totally eliminated.  This reduces diagnostic sensitivity and specificity.  Trace bilateral pleural effusions noted with mesenteric edema and mild perihepatic and perisplenic ascites.  Edema tracks within and around the pancreas, compatible with pancreatitis.  Gallbladder absent.  The common bile duct measures a maximum of 6 mm on today's examination.  On image 15 of series 3, the portion of the common bile duct adjacent to the pancreatic head is somewhat irregular, and there is an appearance of abrupt truncation of the CBD adjacent to the ampulla on that same image. This abrupt truncation is shown on the T2-weighted images, but on the postcontrast images such as images 66-77 of series 1504, there appears to be a focal stricturing or extrinsic narrowing of the CBD but with a small channel communicating with the ampulla  Abnormal high precontrast T1 signal in the pancreatic body is shown on images 48-68 of series 1500, compatible with hemorrhage. This corresponds to a lack of enhancement in the pancreatic body highly concerning for pancreatic necrosis, as noted on the prior CT scan.  IMPRESSION: 1.  The common bile duct currently only measures 6 mm in diameter, but there is focal irregular narrowing in the CBD along the pancreatic head, and also focal narrowing in the distal CBD in the vicinity of the ampulla. Loss of the normal conical  distal CBD tapering.  T2-weighted images suggest an abrupt truncation of the distal-most CBD as can be seen with obstructing stone, but the postcontrast images seem to demonstrate a small channel connecting through to the ampulla raising the possibility that the distal CBD stenosis may be from stricturing or extrinsic compression related to the underlying pancreatitis.  Differentiation is not helped by the degree of  motion artifact. 2.  Pancreatic necrosis of the pancreatic body, with associated hemorrhage in this portion of the pancreas.  3.  Trace bilateral pleural effusions with adjacent atelectasis; mesenteric edema along with perihepatic and perisplenic ascites. Peripancreatic edema pattern with acute pancreatitis.   Original Report Authenticated By: Gaylyn Rong, M.D.    Dg Abd Acute W/chest  05/30/2012  *RADIOLOGY REPORT*  Clinical Data: Abdominal pain and vomiting.  ACUTE ABDOMEN SERIES (ABDOMEN 2 VIEW & CHEST 1 VIEW)  Comparison: 12/05/2008  Findings: Lungs are clear. Heart and mediastinum are within normal limits.  Trachea is midline.  No evidence of free air.  Nonspecific bowel gas pattern.  Surgical clips in the right upper abdomen. Degenerative changes in the lower lumbar spine.  Phleboliths in the pelvis.  IMPRESSION: No acute findings.   Original Report Authenticated By: Richarda Overlie, M.D.    Dg Naso G Tube Plc W/fl-no Rad  06/04/2012  CLINICAL DATA:    NASO G TUBE PLACEMENT WITH FLUORO  Fluoroscopy was utilized by the requesting physician.  No radiographic  interpretation.     Mr Abd W/wo Cm/mrcp  06/02/2012  *RADIOLOGY REPORT*  Clinical Data:  Pancreatitis and biliary dilatation.  Elevated liver function tests.  MRI ABDOMEN WITHOUT AND WITH CONTRAST (INCLUDING MRCP)  Technique:  Multiplanar multisequence MR imaging of the abdomen was performed both before and after the administration of intravenous contrast. Heavily T2-weighted images of the biliary and pancreatic ducts were obtained, and  three-dimensional MRCP images were rendered by post processing.  Contrast: 20 ml Multihance  Comparison:  05/31/2012  Findings:  Despite efforts by the patient and technologist, motion artifact is present on some series of today's examination and could not be totally eliminated.  This reduces diagnostic sensitivity and specificity.  Trace bilateral pleural effusions noted with mesenteric edema and mild perihepatic and perisplenic ascites.  Edema tracks within and around the pancreas, compatible with pancreatitis.  Gallbladder absent.  The common bile duct measures a maximum of 6 mm on today's examination.  On image 15 of series 3, the portion of the common bile duct adjacent to the pancreatic head is somewhat irregular, and there is an appearance of abrupt truncation of the CBD adjacent to the ampulla on that same image. This abrupt truncation is shown on the T2-weighted images, but on the postcontrast images such as images 66-77 of series 1504, there appears to be a focal stricturing or extrinsic narrowing of the CBD but with a small channel communicating with the ampulla  Abnormal high precontrast T1 signal in the pancreatic body is shown on images 48-68 of series 1500, compatible with hemorrhage. This corresponds to a lack of enhancement in the pancreatic body highly concerning for pancreatic necrosis, as noted on the prior CT scan.  IMPRESSION: 1.  The common bile duct currently only measures 6 mm in diameter, but there is focal irregular narrowing in the CBD along the pancreatic head, and also focal narrowing in the distal CBD in the vicinity of the ampulla. Loss of the normal conical distal CBD tapering.  T2-weighted images suggest an abrupt truncation of the distal-most CBD as can be seen with obstructing stone, but the postcontrast images seem to demonstrate a small channel connecting through to the ampulla raising the possibility that the distal CBD stenosis may be from stricturing or extrinsic compression  related to the underlying pancreatitis.  Differentiation is not helped by the degree of motion artifact. 2.  Pancreatic necrosis of the pancreatic body, with associated hemorrhage in  this portion of the pancreas.  3.  Trace bilateral pleural effusions with adjacent atelectasis; mesenteric edema along with perihepatic and perisplenic ascites. Peripancreatic edema pattern with acute pancreatitis.   Original Report Authenticated By: Gaylyn Rong, M.D.    US Abdomen Limited Ruq  05/31/2012  *RADIOLOGY REPORT*  Clinical Data: Pancreatitis.  Elevated LFTs.  LIMITED ABDOMINAL ULTRASOUND  Comparison:  05/31/2012 CT.  Findings:  Gallbladder:  Post cholecystectomy.  Common bile duct:  Only the proximal aspect is well delineated secondary to bowel gas with the common bile duct measuring up to 9.3 mm.  Mid to distal common bile duct stone cannot be excluded based on present exam.  Liver:  Dilated intrahepatic biliary ducts.  Free fluid noted.  Limit evaluation of the pancreas.  Please see recent CT report.  IMPRESSION: Post cholecystectomy.  Dilated proximal common bile duct measuring up to 9.3 mm.   Mid to distal common bile duct not visualized secondary to bowel gas, mid to distal common bile duct stone can not be excluded.  Dilated intrahepatic biliary ducts.   Original Report Authenticated By: Lacy Duverney, M.D.     MEDICATIONS: Scheduled Meds: . antiseptic oral rinse  15 mL Mouth Rinse q12n4p  . chlorhexidine  15 mL Mouth Rinse BID  . ciprofloxacin  400 mg Intravenous Q12H  . DULoxetine  60 mg Oral Daily  . enoxaparin (LOVENOX) injection  40 mg Subcutaneous Q24H  . levothyroxine  100 mcg Oral QAC breakfast  . metoprolol tartrate  25 mg Oral BID  . metronidazole  500 mg Intravenous Q8H  . pantoprazole  40 mg Oral Q0600  . polyethylene glycol  17 g Oral BID  . senna  2 tablet Oral QHS  . sodium chloride  3 mL Intravenous Q12H   Continuous Infusions: . feeding supplement (VITAL AF 1.2 CAL) 1,000 mL  (06/08/12 0556)   PRN Meds:.bisacodyl, diphenhydrAMINE, diphenhydrAMINE, hydrALAZINE, morphine injection, ondansetron (ZOFRAN) IV, ondansetron (ZOFRAN) IV, oxyCODONE-acetaminophen  Antibiotics: Anti-infectives   Start     Dose/Rate Route Frequency Ordered Stop   05/31/12 1400  ciprofloxacin (CIPRO) IVPB 400 mg     400 mg 200 mL/hr over 60 Minutes Intravenous Every 12 hours 05/31/12 1323     05/31/12 1400  metroNIDAZOLE (FLAGYL) IVPB 500 mg     500 mg 100 mL/hr over 60 Minutes Intravenous Every 8 hours 05/31/12 1323         Conley Canal [phone] [facility] Pager:[phone]  If 7PM-7AM, please contact night-coverage [email or web address] Password [email or web address] 06/10/2012, 10:12 AM   LOS: 11 days    Attending Patient seen and examined, agree with the assessment and plan as outlined above. Tolerated NG post pyloric feeds, start clear liquids today. GI planning to repeat CT Abdomen-c/w Cipro and Flagyl till then. Ambulate.  Windell Norfolk MD

## 2012-06-10 NOTE — Progress Notes (Signed)
Rehab Admissions Coordinator Note:  Patient was screened by Trish Mage for appropriateness for an Inpatient Acute Rehab Consult.  At this time, we are recommending Skilled Nursing Facility.  Could also consider HH if patient progresses.  I spoke with onsite reviewer for [facility] and patient does not have a qualifying diagnosis for acute inpatient rehab admission.  Call me for questions.  Trish Mage 06/10/2012, 3:21 PM  I can be reached at [phone].

## 2012-06-10 NOTE — Progress Notes (Signed)
[facility] Daily Rounding Note 06/10/2012, 9:50 AM  SUBJECTIVE:       Large BM yesterday, after wich bloating and abd discomfort improved. Still has pain in mid abdomen, same has he has had for the duration, still requiring narcotics, though PCA pump is gone.  Continues on tube feeds. Clears just ordered.  C/o burning associated with the NG. Has not been out of bed much.   Coughing clear sputum.   OBJECTIVE:         Vital signs in last 24 hours:    Temp:  [97.6 F (36.4 C)-98.9 F (37.2 C)] 98.4 F (36.9 C) (03/31 0526) Pulse Rate:  [77-95] 86 (03/31 0526) Resp:  [18] 18 (03/31 0526) BP: (124-137)/(64-77) 137/64 mmHg (03/31 0526) SpO2:  [97 %-98 %] 97 % (03/31 0526) Weight:  [79.3 kg (174 lb 13.2 oz)] 79.3 kg (174 lb 13.2 oz) (03/31 0526) Last BM Date: 06/29/12 General: looks well.  Comfortable, no diaphoresis   Heart: RRR Chest: clear B Abdomen: soft, mild tenderness on left upper/mid area. Active BS  Extremities: no CCE Neuro/Psych:  Pleasant, no depression or agitation.   Intake/Output from previous day: 2022-06-30 0701 - 03/31 0700 In: 400 [P.O.:360; NG/GT:40] Out: -   Intake/Output this shift:    Lab Results:  Recent Labs  06/08/12 0600 06/29/12 0554  WBC 11.2* 12.9*  HGB 12.0* 12.2*  HCT 34.2* 37.8*  PLT 277 302   BMET  Recent Labs  06/08/12 0600 June 29, 2012 0554  NA 134* 135  K 3.9 4.2  CL 99 98  CO2 25 25  GLUCOSE 169* 111*  BUN 13 11  CREATININE 0.71 0.64  CALCIUM 8.4 8.8   LFT  Recent Labs  29-Jun-2012 0554  PROT 6.9  ALBUMIN 2.7*  AST 12  ALT 28  ALKPHOS 161*  BILITOT 0.4    Studies/Results: Dg Abd 2 Views  2012/06/29  *RADIOLOGY REPORT*  Clinical Data: Abdominal distention.  Evaluate stool burden.  ABDOMEN - 2 VIEW  Comparison: Abdominal CT 05/31/2012  Findings: The feeding tube is in the region of the proximal jejunum.  There is contrast throughout the colon with at least mild to moderate stool in the right colon region.  There is  gas within the small bowel loops but no significant small bowel gaseous distention.  There is gas and stool in the rectum. Densities at the right lung base could represent atelectasis and cannot exclude pleural fluid.  IMPRESSION: There is oral contrast and stool within the colon.  No evidence for bowel obstruction.  Feeding tube in the jejunum.   Original Report Authenticated By: Richarda Overlie, M.D.     ASSESMENT: * Acute on chronic, recurrent pancreatitis. ? Intrinsic vs extrinsic CBD narrowing, ? Stricture?. Some pancreatic necrosis on CT. GB removed 2009 in [place]. Surgery does not see a role in setting of sterile necrosis. LFTs steadily improving. WBCs stable, no fever, day 10 Cipro/Flagyl. Renal function not compromised. Tolerating tube feeds.  * SIRS, resolved.  * Tachycardia. resolved. Not on beta blocker.  *  Constipation, improved.  starte on BID Miralax 06-30-2022    PLAN: ? Need for Cipro/Flagyl at this point.  *  Change to po Protonix.  *  Wonder if we should repeat CT abdomen? Assess for fluid collections, determine need for ongoing ABX?Marland Kitchen...  *  Aim to d/c the tube feedings, agree with starting clears.    LOS: 11 days   Justin [name]  06/10/2012, 9:50 AM Pager: [phone]  I have taken an interval history, reviewed the chart and examined the patient. I agree with the Advanced Practitioner's note, impression and recommendations. Would continue current antibiotics for now. Will consider DC antibiotics after reviewing repeat abdomen/pelvic CT.   Venita Lick. Russella Dar MD Clementeen Graham

## 2012-06-10 NOTE — Evaluation (Signed)
Physical Therapy Evaluation Patient Details Name: Justin [name] MRN: [id number] DOB: 1956-01-22 Today's Date: 06/10/2012 Time: 4098-1191 PT Time Calculation (min): 40 min  PT Assessment / Plan / Recommendation Clinical Impression  Pt adm with pancreatitis.  Needs skilled PT to maximize I and safety so pt can return home with wife.  Could benefit from short CIR stay prior to return home.      PT Assessment  Patient needs continued PT services    Follow Up Recommendations  CIR    Does the patient have the potential to tolerate intense rehabilitation      Barriers to Discharge        Equipment Recommendations  Rolling walker with 5" wheels    Recommendations for Other Services     Frequency Min 3X/week    Precautions / Restrictions Precautions Precautions: Fall   Pertinent Vitals/Pain See flow sheet      Mobility  Transfers Transfers: Sit to Stand;Stand to Sit Sit to Stand: 3: Mod assist;With upper extremity assist;With armrests;From chair/3-in-1 Stand to Sit: 4: Min assist;With upper extremity assist;With armrests;To chair/3-in-1 Details for Transfer Assistance: Assist to bring hips up. Ambulation/Gait Ambulation/Gait Assistance: 4: Min assist Ambulation Distance (Feet): 30 Feet Assistive device: Rolling walker Ambulation/Gait Assistance Details: Verbal cues to stand more erect.  Pt fatigued quickly and began leaning over walker.  Had reclined brought to pt. Gait Pattern: Step-through pattern;Decreased step length - right;Decreased step length - left;Shuffle;Trunk flexed Gait velocity: slow    Exercises     PT Diagnosis: Difficulty walking;Generalized weakness;Acute pain  PT Problem List: Decreased strength;Decreased activity tolerance;Decreased balance;Decreased mobility;Decreased knowledge of use of DME PT Treatment Interventions: DME instruction;Gait training;Patient/family education;Functional mobility training;Stair training;Therapeutic activities;Therapeutic  exercise;Balance training   PT Goals Acute Rehab PT Goals PT Goal Formulation: With patient Time For Goal Achievement: 06/10/12 Potential to Achieve Goals: Good Pt will go Supine/Side to Sit: Independently PT Goal: Supine/Side to Sit - Progress: Goal set today Pt will go Sit to Supine/Side: Independently PT Goal: Sit to Supine/Side - Progress: Goal set today Pt will go Sit to Stand: with modified independence PT Goal: Sit to Stand - Progress: Goal set today Pt will go Stand to Sit: with modified independence PT Goal: Stand to Sit - Progress: Goal set today Pt will Ambulate: 51 - 150 feet;with modified independence;with least restrictive assistive device PT Goal: Ambulate - Progress: Goal set today Pt will Go Up / Down Stairs: 3-5 stairs;with supervision;with rail(s) PT Goal: Up/Down Stairs - Progress: Goal set today  Visit Information  Last PT Received On: 06/10/12 Assistance Needed: +1    Subjective Data  Subjective: Pt states he feels very weak. Patient Stated Goal: Return home with wife.   Prior Functioning  Home Living Lives With: Spouse;Family Available Help at Discharge: Family;Available 24 hours/day Type of Home: House Home Access: Stairs to enter [employer] of Steps: 3 Entrance Stairs-Rails: Right Home Layout: One level Home Adaptive Equipment: Straight cane Prior Function Level of Independence: Independent with assistive device(s) (used cane) Able to Take Stairs?: Yes Communication Communication: No difficulties    Cognition  Cognition Overall Cognitive Status: Appears within functional limits for tasks assessed/performed Arousal/Alertness: Awake/alert Orientation Level: Appears intact for tasks assessed Behavior During Session: [facility] for tasks performed    Extremity/Trunk Assessment Right Lower Extremity Assessment RLE ROM/Strength/Tone: Deficits RLE ROM/Strength/Tone Deficits: grossly 4-/5 Left Lower Extremity Assessment LLE  ROM/Strength/Tone: Deficits LLE ROM/Strength/Tone Deficits: grossly 4-/5   Balance Balance Balance Assessed: Yes Static Standing Balance Static Standing -  Balance Support: Bilateral upper extremity supported;During functional activity Static Standing - Level of Assistance: 5: Stand by assistance  End of Session PT - End of Session Activity Tolerance: Patient limited by fatigue Patient left: in chair;with call bell/phone within reach Nurse Communication: Mobility status  GP     [facility] 06/10/2012, 2:08 PM  [facility] PT [phone]

## 2012-06-10 NOTE — Progress Notes (Addendum)
Continue nutrition as tolerated.  Trying post-ampullary feeding.  Clears OK if better  D/C morphine - ampullary spasms - switch to fentanyl.  Tylenol TID  Work towards improving condition with therapy  D/C antibiotics - no benefit of cipro/flagyl in setting of pancreatitis.  No evidence of infection elsewhere  If worsening:  NPO, TNA, start Primaxin (only Abx to be of utility in necrotizing pancreatitis).  Could reconsider CT but HOLD OFF ON ANY PERC DRAINAGE until discuss with surgery.  Most likely would be pseudocysts that will heal on their own vs internal drainage if persists after 6 weeks of Dx.  ?role of ERCP to r/o / Tx stricture seen on CT - prob hold off since LFTs better than admit, but still follow.  Ardeth Sportsman, M.D., F.A.C.S. Gastrointestinal and Minimally Invasive Surgery [facility] [address], [address] [place], [place] [address] [phone] Main / Paging [phone] Voice Mail

## 2012-06-11 DIAGNOSIS — K863 Pseudocyst of pancreas: Secondary | ICD-10-CM

## 2012-06-11 DIAGNOSIS — K8591 Acute pancreatitis with uninfected necrosis, unspecified: Secondary | ICD-10-CM | POA: Diagnosis present

## 2012-06-11 DIAGNOSIS — K862 Cyst of pancreas: Secondary | ICD-10-CM

## 2012-06-11 DIAGNOSIS — K831 Obstruction of bile duct: Secondary | ICD-10-CM

## 2012-06-11 LAB — CBC
Hemoglobin: 14.2 g/dL (ref 13.0–17.0)
MCH: 28.9 pg (ref 26.0–34.0)
MCV: 83.3 fL (ref 78.0–100.0)
Platelets: 333 10*3/uL (ref 150–400)
RBC: 4.92 MIL/uL (ref 4.22–5.81)
WBC: 14.6 10*3/uL — ABNORMAL HIGH (ref 4.0–10.5)

## 2012-06-11 LAB — BASIC METABOLIC PANEL
CO2: 30 mEq/L (ref 19–32)
Chloride: 100 mEq/L (ref 96–112)
Creatinine, Ser: 0.95 mg/dL (ref 0.50–1.35)
Glucose, Bld: 128 mg/dL — ABNORMAL HIGH (ref 70–99)

## 2012-06-11 MED ORDER — PANCRELIPASE (LIP-PROT-AMYL) 12000-38000 UNITS PO CPEP
2.0000 | ORAL_CAPSULE | Freq: Three times a day (TID) | ORAL | Status: DC
  Administered 2012-06-11 – 2012-06-14 (×11): 2 via ORAL
  Filled 2012-06-11 (×13): qty 2

## 2012-06-11 MED ORDER — POLYETHYLENE GLYCOL 3350 17 G PO PACK
17.0000 g | PACK | Freq: Every day | ORAL | Status: DC
  Filled 2012-06-11: qty 1

## 2012-06-11 MED ORDER — LEVOTHYROXINE SODIUM 100 MCG IV SOLR
50.0000 ug | Freq: Every day | INTRAVENOUS | Status: DC
  Administered 2012-06-11 – 2012-06-14 (×4): 50 ug via INTRAVENOUS
  Filled 2012-06-11 (×6): qty 5

## 2012-06-11 MED ORDER — PANTOPRAZOLE SODIUM 40 MG IV SOLR
40.0000 mg | INTRAVENOUS | Status: DC
  Administered 2012-06-11 – 2012-06-13 (×3): 40 mg via INTRAVENOUS
  Filled 2012-06-11 (×5): qty 40

## 2012-06-11 MED ORDER — MENTHOL 3 MG MT LOZG
1.0000 | LOZENGE | OROMUCOSAL | Status: DC | PRN
  Administered 2012-06-13: 3 mg via ORAL
  Filled 2012-06-11: qty 9

## 2012-06-11 MED ORDER — PHENOL 1.4 % MT LIQD
2.0000 | OROMUCOSAL | Status: DC | PRN
  Filled 2012-06-11: qty 177

## 2012-06-11 MED ORDER — VITAL AF 1.2 CAL PO LIQD
1000.0000 mL | ORAL | Status: DC
  Filled 2012-06-11: qty 1000

## 2012-06-11 MED ORDER — POLYETHYLENE GLYCOL 3350 17 G PO PACK
17.0000 g | PACK | Freq: Every day | ORAL | Status: DC | PRN
  Filled 2012-06-11: qty 1

## 2012-06-11 MED ORDER — VITAL AF 1.2 CAL PO LIQD
1000.0000 mL | ORAL | Status: DC
  Administered 2012-06-11 – 2012-06-12 (×3): 1000 mL
  Filled 2012-06-11 (×10): qty 1000

## 2012-06-11 MED ORDER — BOOST / RESOURCE BREEZE PO LIQD: 1.0000 | ORAL | Status: DC

## 2012-06-11 NOTE — Progress Notes (Signed)
PATIENT DETAILS Name: Justin [name] Age: 57 y.o. Sex: male Date of Birth: December 28, 1955 Admit Date: 05/30/2012 Admitting Physician Vania Rea, MD PCP:No primary provider on file.  Subjective: Continues to have crampy abdominal pain and loose bowel movements.   Asks when he can go home.  Assessment/Plan: Active Problems:  Severe necrotizing pancreatitis with phlegmon. Etiology not ready evident-question of either a distal CBD stone (felt unlikely ) or a biliary stricture- current plans are for continued conservative management-and EUS when much more stable LFTs down trending. WBC is trending up.  This is likely due to pancreatic inflamation. No longer on antibiotics Pain medications are Fentanyl (allergy to dilaudid) and percocet. GI and CCS following NG tube with post pyloric tube feeding is at goal and will be changed to night time feedings (slowly advance diet during the day) Add pancreatic enzymes when eating solids.    Abdominal Distention occurred 3/9 night.  Improved after bowel movements. Bowel regimen changed to Miralax PRN after the patient is continuing to have loose stools. Check c-diff if he is having loose stools (4/2) Ambulate patient.  Systemic inflammation response syndrome Secondary to above This has resolved with pancreatitis slowly resolving  Hypokalemia Resolved Monitor electrolytes closely  Hypophosphatemia Resolved Monitor electrolytes  Hypertension Was on scheduled IV metoprolol and IV hydralazine-now on metoprolol orally-we'll use IV hydralazine as needed. BP stable  Dyslipidemia Holding statins for now- resume when able or discharge  History of coronary artery disease status post CABG Currently stable Resume aspirin over the course of the next few days- did have pancreatic hemorrhage seen on MRCP   Hypothyroidism Continue with Synthroid  Fibromyalgia  Well compensated at this time  Depression Resume  Cymbalta  Disposition: -Remain inpatient  DVT Prophylaxis: Prophylactic Lovenox  Code Status: Full code   Procedures:  None  CONSULTS:  GI and general surgery  PHYSICAL EXAM: Vital signs in last 24 hours: Filed Vitals:   06/10/12 2232 06/11/12 0216 06/11/12 0645 06/11/12 1010  BP: 115/79 110/70 117/78 115/79  Pulse:  77 92 99  Temp:  98.3 F (36.8 C) 97.8 F (36.6 C) 98.2 F (36.8 C)  TempSrc:  Oral Oral Oral  Resp:  18 18 18   Height:      Weight:   75.5 kg (166 lb 7.2 oz)   SpO2:  96% 94% 95%    Weight change: -3.8 kg (-8 lb 6 oz) Body mass index is 26.47 kg/(m^2).   Gen Exam: Awake and alert with clear speech.  Sitting in bed reading. Neck: Supple, No JVD.  No thyromegaly Chest: CTA no w/c/r  CVS: S1 S2 Regular, no murmurs.  Abdomen: soft, BS +, tender to palpation in the epigastrum, + bowel sounds  Extremities: no edema, lower extremities warm to touch. Neurologic: Non Focal.   Skin: No Rash, bruises or lesions   Intake/Output from previous day:  Intake/Output Summary (Last 24 hours) at 06/11/12 1038 Last data filed at 06/11/12 1010  Gross per 24 hour  Intake    572 ml  Output    800 ml  Net   -228 ml     LAB RESULTS: CBC  Recent Labs Lab 06/05/12 0425 06/06/12 0445 06/08/12 0600 06/09/12 0554 06/11/12 0650  WBC 15.8* 15.9* 11.2* 12.9* 14.6*  HGB 14.1 13.1 12.0* 12.2* 14.2  HCT 40.2 36.3* 34.2* 37.8* 41.0  PLT 244 233 277 302 333  MCV 83.2 81.8 82.2 85.3 83.3  MCH 29.2 29.5 28.8 27.5 28.9  MCHC 35.1 36.1* 35.1 32.3  34.6  RDW 14.8 15.1 15.6* 15.4 15.3    Chemistries   Recent Labs Lab 06/05/12 0425 06/06/12 0445 06/07/12 0645 06/08/12 0600 06/09/12 0554 06/11/12 0650  NA 133* 136 135 134* 135 138  K 3.6 3.9 3.9 3.9 4.2 4.3  CL 98 102 102 99 98 100  CO2 22 26 25 25 25 30   GLUCOSE 119* 109* 142* 169* 111* 128*  BUN 7 10 12 13 11 14   CREATININE 0.70 0.81 0.70 0.71 0.64 0.95  CALCIUM 8.6 8.2* 8.2* 8.4 8.8 9.4  MG 2.0  --   2.0  --  2.2  --     MICROBIOLOGY: Recent Results (from the past 240 hour(s))  MRSA PCR SCREENING     Status: None   Collection Time    06/01/12  3:00 PM      Result Value Range Status   MRSA by PCR NEGATIVE  NEGATIVE Final   Comment:            The GeneXpert MRSA Assay (FDA     approved for NASAL specimens     only), is one component of a     comprehensive MRSA colonization     surveillance program. It is not     intended to diagnose MRSA     infection nor to guide or     monitor treatment for     MRSA infections.    RADIOLOGY STUDIES/RESULTS: Dg Abd 1 View  06/04/2012  *RADIOLOGY REPORT*  Clinical Data: Feeding tube placement  ABDOMEN - 1 VIEW  Comparison: None.  Fluoroscopy time: 18 seconds  Findings: Weighted feeding tube has been placed in the proximal duodenum just past the ligament of Treitz.  IMPRESSION: Weighted feeding tube terminates in the proximal duodenum.   Original Report Authenticated By: Charline Bills, M.D.    Ct Abdomen Pelvis W Contrast  05/31/2012  *RADIOLOGY REPORT*  Clinical Data: Acute pancreatitis, mid abdominal pain  CT ABDOMEN AND PELVIS WITH CONTRAST  Technique:  Multidetector CT imaging of the abdomen and pelvis was performed following the standard protocol during bolus administration of intravenous contrast.  Contrast: 50mL OMNIPAQUE IOHEXOL 300 MG/ML  SOLN, 1 OMNIPAQUE IOHEXOL 300 MG/ML  SOLN, OMNIPAQUE IOHEXOL 300 MG/ML  SOLN  Comparison: Chest and abdomen films of 05/30/2012  Findings: There is bibasilar linear atelectasis or scarring present.  No effusion is seen. There does appear to be a small hiatal hernia present.  The liver enhances and there is prominence of the central intrahepatic ducts.  Surgical clips are present from prior cholecystectomy.  However there is marked edema of the neck and proximal body of the pancreas with considerable peripancreatic exudate consistent with acute pancreatitis.  The common bile duct is somewhat prominent  measuring 9 mm in diameter but no definite calculus is seen. Stricture or mass cannot be excluded although no obvious mass is evident.  The fact that the head of the pancreatic parenchyma enhances as does the distal body and tail, but there is no enhancement of the distal head and proximal body is worrisome for pancreatic necrosis.  There is edema of the adjacent descending duodenum and distal stomach.  No discrete abscess or pseudocyst is evident.  The adrenal glands and spleen are unremarkable.  The stomach is moderately distended with contrast with no gross abnormality other than the previously noted edema.  The kidneys enhance with several small nonobstructing renal calculi noted bilaterally.  The abdominal aorta is normal in caliber.  No adenopathy is seen.  Pancreatic exudate extends into the right retroperitoneum.  There is also a moderate amount of free fluid noted layering within the pelvis.  The urinary bladder is unremarkable.  The prostate is normal in size.  Scattered rectosigmoid colonic diverticula are seen.  No abnormality of the terminal ileum or appendix is noted. Degenerative change is noted involving the facet joints of L4-5 and L5-S1.  IMPRESSION:  1.  Acute pancreatitis with edema of the distal head and proximal body the pancreas and considerable surrounding exudate extending into the pelvis.  The lack of enhancement of the distal head and proximal body the pancreas is worrisome for pancreatic necrosis. No abscess or pseudocyst is evident currently. 2.  Prominent common bile duct and intrahepatic ducts may be due to edema but a non-visualized distal calculus, stricture or mass cannot be excluded as the etiology. 3.  Moderate amount of free fluid in the pelvis. 4.  Small hiatal hernia. 5.  Small nonobstructing bilateral renal calculi.   Original Report Authenticated By: Dwyane Dee, M.D.    Mr 3d Recon At Scanner  06/02/2012  *RADIOLOGY REPORT*  Clinical Data:  Pancreatitis and biliary  dilatation.  Elevated liver function tests.  MRI ABDOMEN WITHOUT AND WITH CONTRAST (INCLUDING MRCP)  Technique:  Multiplanar multisequence MR imaging of the abdomen was performed both before and after the administration of intravenous contrast. Heavily T2-weighted images of the biliary and pancreatic ducts were obtained, and three-dimensional MRCP images were rendered by post processing.  Contrast: 20 ml Multihance  Comparison:  05/31/2012  Findings:  Despite efforts by the patient and technologist, motion artifact is present on some series of today's examination and could not be totally eliminated.  This reduces diagnostic sensitivity and specificity.  Trace bilateral pleural effusions noted with mesenteric edema and mild perihepatic and perisplenic ascites.  Edema tracks within and around the pancreas, compatible with pancreatitis.  Gallbladder absent.  The common bile duct measures a maximum of 6 mm on today's examination.  On image 15 of series 3, the portion of the common bile duct adjacent to the pancreatic head is somewhat irregular, and there is an appearance of abrupt truncation of the CBD adjacent to the ampulla on that same image. This abrupt truncation is shown on the T2-weighted images, but on the postcontrast images such as images 66-77 of series 1504, there appears to be a focal stricturing or extrinsic narrowing of the CBD but with a small channel communicating with the ampulla  Abnormal high precontrast T1 signal in the pancreatic body is shown on images 48-68 of series 1500, compatible with hemorrhage. This corresponds to a lack of enhancement in the pancreatic body highly concerning for pancreatic necrosis, as noted on the prior CT scan.  IMPRESSION: 1.  The common bile duct currently only measures 6 mm in diameter, but there is focal irregular narrowing in the CBD along the pancreatic head, and also focal narrowing in the distal CBD in the vicinity of the ampulla. Loss of the normal conical  distal CBD tapering.  T2-weighted images suggest an abrupt truncation of the distal-most CBD as can be seen with obstructing stone, but the postcontrast images seem to demonstrate a small channel connecting through to the ampulla raising the possibility that the distal CBD stenosis may be from stricturing or extrinsic compression related to the underlying pancreatitis.  Differentiation is not helped by the degree of motion artifact. 2.  Pancreatic necrosis of the pancreatic body, with associated hemorrhage in this portion of the pancreas.  3.  Trace bilateral pleural effusions with adjacent atelectasis; mesenteric edema along with perihepatic and perisplenic ascites. Peripancreatic edema pattern with acute pancreatitis.   Original Report Authenticated By: Gaylyn Rong, M.D.    Dg Abd Acute W/chest  05/30/2012  *RADIOLOGY REPORT*  Clinical Data: Abdominal pain and vomiting.  ACUTE ABDOMEN SERIES (ABDOMEN 2 VIEW & CHEST 1 VIEW)  Comparison: 12/05/2008  Findings: Lungs are clear. Heart and mediastinum are within normal limits.  Trachea is midline.  No evidence of free air.  Nonspecific bowel gas pattern.  Surgical clips in the right upper abdomen. Degenerative changes in the lower lumbar spine.  Phleboliths in the pelvis.  IMPRESSION: No acute findings.   Original Report Authenticated By: Richarda Overlie, M.D.    Dg Naso G Tube Plc W/fl-no Rad  06/04/2012  CLINICAL DATA:    NASO G TUBE PLACEMENT WITH FLUORO  Fluoroscopy was utilized by the requesting physician.  No radiographic  interpretation.     Mr Abd W/wo Cm/mrcp  06/02/2012  *RADIOLOGY REPORT*  Clinical Data:  Pancreatitis and biliary dilatation.  Elevated liver function tests.  MRI ABDOMEN WITHOUT AND WITH CONTRAST (INCLUDING MRCP)  Technique:  Multiplanar multisequence MR imaging of the abdomen was performed both before and after the administration of intravenous contrast. Heavily T2-weighted images of the biliary and pancreatic ducts were obtained, and  three-dimensional MRCP images were rendered by post processing.  Contrast: 20 ml Multihance  Comparison:  05/31/2012  Findings:  Despite efforts by the patient and technologist, motion artifact is present on some series of today's examination and could not be totally eliminated.  This reduces diagnostic sensitivity and specificity.  Trace bilateral pleural effusions noted with mesenteric edema and mild perihepatic and perisplenic ascites.  Edema tracks within and around the pancreas, compatible with pancreatitis.  Gallbladder absent.  The common bile duct measures a maximum of 6 mm on today's examination.  On image 15 of series 3, the portion of the common bile duct adjacent to the pancreatic head is somewhat irregular, and there is an appearance of abrupt truncation of the CBD adjacent to the ampulla on that same image. This abrupt truncation is shown on the T2-weighted images, but on the postcontrast images such as images 66-77 of series 1504, there appears to be a focal stricturing or extrinsic narrowing of the CBD but with a small channel communicating with the ampulla  Abnormal high precontrast T1 signal in the pancreatic body is shown on images 48-68 of series 1500, compatible with hemorrhage. This corresponds to a lack of enhancement in the pancreatic body highly concerning for pancreatic necrosis, as noted on the prior CT scan.  IMPRESSION: 1.  The common bile duct currently only measures 6 mm in diameter, but there is focal irregular narrowing in the CBD along the pancreatic head, and also focal narrowing in the distal CBD in the vicinity of the ampulla. Loss of the normal conical distal CBD tapering.  T2-weighted images suggest an abrupt truncation of the distal-most CBD as can be seen with obstructing stone, but the postcontrast images seem to demonstrate a small channel connecting through to the ampulla raising the possibility that the distal CBD stenosis may be from stricturing or extrinsic compression  related to the underlying pancreatitis.  Differentiation is not helped by the degree of motion artifact. 2.  Pancreatic necrosis of the pancreatic body, with associated hemorrhage in this portion of the pancreas.  3.  Trace bilateral pleural effusions with adjacent atelectasis; mesenteric edema along with perihepatic and perisplenic  ascites. Peripancreatic edema pattern with acute pancreatitis.   Original Report Authenticated By: Gaylyn Rong, M.D.    US Abdomen Limited Ruq  05/31/2012  *RADIOLOGY REPORT*  Clinical Data: Pancreatitis.  Elevated LFTs.  LIMITED ABDOMINAL ULTRASOUND  Comparison:  05/31/2012 CT.  Findings:  Gallbladder:  Post cholecystectomy.  Common bile duct:  Only the proximal aspect is well delineated secondary to bowel gas with the common bile duct measuring up to 9.3 mm.  Mid to distal common bile duct stone cannot be excluded based on present exam.  Liver:  Dilated intrahepatic biliary ducts.  Free fluid noted.  Limit evaluation of the pancreas.  Please see recent CT report.  IMPRESSION: Post cholecystectomy.  Dilated proximal common bile duct measuring up to 9.3 mm.   Mid to distal common bile duct not visualized secondary to bowel gas, mid to distal common bile duct stone can not be excluded.  Dilated intrahepatic biliary ducts.   Original Report Authenticated By: Lacy Duverney, M.D.     MEDICATIONS: Scheduled Meds: . acetaminophen  1,000 mg Oral TID  . antiseptic oral rinse  15 mL Mouth Rinse q12n4p  . chlorhexidine  15 mL Mouth Rinse BID  . DULoxetine  60 mg Oral Daily  . enoxaparin (LOVENOX) injection  40 mg Subcutaneous Q24H  . levothyroxine  50 mcg Intravenous QAC breakfast  . lip balm   Topical BID  . lipase/protease/amylase  2 capsule Oral TID AC  . metoprolol tartrate  25 mg Oral BID  . pantoprazole (PROTONIX) IV  40 mg Intravenous Q24H  . saccharomyces boulardii  250 mg Oral BID  . sodium chloride  3 mL Intravenous Q12H   Continuous Infusions: . feeding  supplement (VITAL AF 1.2 CAL) 1,000 mL (06/11/12 0503)   PRN Meds:.bisacodyl, diphenhydrAMINE, diphenhydrAMINE, fentaNYL, hydrALAZINE, magic mouthwash, menthol-cetylpyridinium, nitroGLYCERIN, ondansetron (ZOFRAN) IV, oxyCODONE, phenol, polyethylene glycol  Antibiotics: Anti-infectives   Start     Dose/Rate Route Frequency Ordered Stop   05/31/12 1400  ciprofloxacin (CIPRO) IVPB 400 mg  Status:  Discontinued     400 mg 200 mL/hr over 60 Minutes Intravenous Every 12 hours 05/31/12 1323 06/10/12 1449   05/31/12 1400  metroNIDAZOLE (FLAGYL) IVPB 500 mg  Status:  Discontinued     500 mg 100 mL/hr over 60 Minutes Intravenous Every 8 hours 05/31/12 1323 06/10/12 [address], [place] [phone] [facility]  If 7PM-7AM, please contact night-coverage [email or web address] Password [email or web address] 06/11/2012, 10:38 AM   LOS: 12 days   Attending Patient seen and examined, agree with the assessment and plan as outlined above. Unfortunately. Developed crampy abd pain after he ate liquids this am. Will continue with NG post pyloric feeds. Keeping NPO in the meantime.Appreciate CCS and GI input  S Ghimire  S Gh

## 2012-06-11 NOTE — Progress Notes (Addendum)
NUTRITION FOLLOW UP  Addendum: Pt not tolerating TF per PA. RD will change orders back to continuous feeding per previous regimen.    Intervention:   1. D/c current TF orders.  2.Start Vital AF 1.2 @ 70 ml/hr continuous, this was the previous TF regimen.  3. RD will continue to follow    Nutrition Dx:   Inadequate oral intake related to inability to eat as evidenced by NPO status. Ongoing  Goal:   Intake to meet >/=90% estimated nutrition needs. Met   Monitor:   Weight trends, I/O's, PO tolerance, labs  Assessment:   Pt continues with NG tube feeding, Vital AF 1.2 70 ml/hr. This is currently providing 2016 kcal, 126 grams protein, 1362 ml free water. Pt is on clear liquid diet, tolerating well. Per rounds, will transition pt to nocturnal feeding and encourage po intake in the day. Starting with clears today and advance slowly to small frequent meals.   Notes indicate that if pt is unable to tolerate PO intake, will transition to TPN. Recommend continue enteral nutrition over TPN if possible. Pt is currently tolerating post pyloric tube feedings.   Height: Ht Readings from Last 1 Encounters:  06/07/12 5' 6.5" (1.689 m)    Weight Status:   Wt Readings from Last 1 Encounters:  06/11/12 166 lb 7.2 oz (75.5 kg)    Re-estimated needs:  Kcal: 2000-2300 Protein: >/= 113 grams  Fluid: 2-2.4 L   Skin: intact   Diet Order: Clear Liquid   Intake/Output Summary (Last 24 hours) at 06/11/12 1021 Last data filed at 06/11/12 1010  Gross per 24 hour  Intake    572 ml  Output    800 ml  Net   -228 ml  I/O's stat pt is +79 L this admission?   Last BM: 4/1   Labs:   Recent Labs Lab 06/05/12 0425 06/05/12 1424  06/07/12 0645 06/08/12 0600 06/09/12 0554 06/11/12 0650  NA 133*  --   < > 135 134* 135 138  K 3.6  --   < > 3.9 3.9 4.2 4.3  CL 98  --   < > 102 99 98 100  CO2 22  --   < > 25 25 25 30   BUN 7  --   < > 12 13 11 14   CREATININE 0.70  --   < > 0.70 0.71 0.64 0.95   CALCIUM 8.6  --   < > 8.2* 8.4 8.8 9.4  MG 2.0  --   --  2.0  --  2.2  --   PHOS 2.5 2.5  --  2.6  --  3.1  --   GLUCOSE 119*  --   < > 142* 169* 111* 128*  < > = values in this interval not displayed.  CBG (last 3)  No results found for this basename: GLUCAP,  in the last 72 hours  Scheduled Meds: . acetaminophen  1,000 mg Oral TID  . antiseptic oral rinse  15 mL Mouth Rinse q12n4p  . chlorhexidine  15 mL Mouth Rinse BID  . DULoxetine  60 mg Oral Daily  . enoxaparin (LOVENOX) injection  40 mg Subcutaneous Q24H  . levothyroxine  50 mcg Intravenous QAC breakfast  . lip balm   Topical BID  . lipase/protease/amylase  2 capsule Oral TID AC  . metoprolol tartrate  25 mg Oral BID  . pantoprazole (PROTONIX) IV  40 mg Intravenous Q24H  . polyethylene glycol  17 g Oral Daily  .  saccharomyces boulardii  250 mg Oral BID  . sodium chloride  3 mL Intravenous Q12H    Continuous Infusions: . feeding supplement (VITAL AF 1.2 CAL) 1,000 mL (06/11/12 0503)    Clarene Duke RD, LDN Pager [phone] After Hours pager [phone]

## 2012-06-11 NOTE — Progress Notes (Signed)
Patient ID: Justin [name], male   DOB: May 06, 1955, 57 y.o.   MRN: [id number]    Subjective: Pt denies n/v at this time, no resting pain but does have pain with palp.  Objective: Vital signs in last 24 hours: Temp:  [97.8 F (36.6 C)-98.6 F (37 C)] 97.8 F (36.6 C) (04/01 0645) Pulse Rate:  [77-100] 92 (04/01 0645) Resp:  [18] 18 (04/01 0645) BP: (110-136)/(70-86) 117/78 mmHg (04/01 0645) SpO2:  [94 %-99 %] 94 % (04/01 0645) Weight:  [166 lb 7.2 oz (75.5 kg)] 166 lb 7.2 oz (75.5 kg) (04/01 0645) Last BM Date: 06/10/12  Intake/Output from previous day: 03/31 0701 - 04/01 0700 In: 450 [NG/GT:450] Out: 800 [Urine:800] Intake/Output this shift:    PE: Abd: mildly distended, pain in epigastric and LUQ with palp, +BS General: awake, alert, NAD  Lab Results:   Recent Labs  06/09/12 0554 06/11/12 0650  WBC 12.9* 14.6*  HGB 12.2* 14.2  HCT 37.8* 41.0  PLT 302 333   BMET  Recent Labs  06/09/12 0554 06/11/12 0650  NA 135 138  K 4.2 4.3  CL 98 100  CO2 25 30  GLUCOSE 111* 128*  BUN 11 14  CREATININE 0.64 0.95  CALCIUM 8.8 9.4   PT/INR No results found for this basename: LABPROT, INR,  in the last 72 hours CMP     Component Value Date/Time   NA 138 06/11/2012 0650   K 4.3 06/11/2012 0650   CL 100 06/11/2012 0650   CO2 30 06/11/2012 0650   GLUCOSE 128* 06/11/2012 0650   BUN 14 06/11/2012 0650   CREATININE 0.95 06/11/2012 0650   CALCIUM 9.4 06/11/2012 0650   PROT 6.9 06/09/2012 0554   ALBUMIN 2.7* 06/09/2012 0554   AST 12 06/09/2012 0554   ALT 28 06/09/2012 0554   ALKPHOS 161* 06/09/2012 0554   BILITOT 0.4 06/09/2012 0554   GFRNONAA >90 06/11/2012 0650   GFRAA >90 06/11/2012 0650   Lipase     Component Value Date/Time   LIPASE 222* 06/02/2012 0545       Studies/Results: Ct Abdomen Pelvis W Contrast  06/10/2012  *RADIOLOGY REPORT*  Clinical Data: Pancreatitis.  The upper quadrant pain.  Nausea vomiting.  CT ABDOMEN AND PELVIS WITH CONTRAST  Technique:  Multidetector CT  imaging of the abdomen and pelvis was performed following the standard protocol during bolus administration of intravenous contrast.  Contrast: 80mL OMNIPAQUE IOHEXOL 300 MG/ML  SOLN  Comparison: 318/21/14  Findings: imaging through the lung bases shows bibasilar collapse / consolidation with small left pleural effusion.  Tiny low-density lesions in the liver are stable.  Intrahepatic biliary duct dilatation has decreased in the interval.  Spleen is unremarkable.  Feeding tube tip is transpyloric, in the proximal jejunum.  5.0 x 5.7 cm low density collection identified in the retroperitoneum at the level of the pancreatic body.  There is no enhancing pancreatic parenchyma in the body the pancreas, consistent with pancreatic necrosis and phlegmonous change within the retroperitoneal space.  1.9 x 5.1 cm rim enhancing fluid collection anterior to the pancreatic tail is compatible with evolving pseudocyst.  There is fluid in the root of the small bowel mesentery.  These changes generate significant mass effect on the superior mesenteric vein and splenic vein, near the portosplenic confluence.  The splenic vein is only a string sign as it enters the confluence.  Multiple tiny stones are seen in the kidneys bilaterally without urinary obstruction.  No abdominal aortic aneurysm.  No  retroperitoneal lymphadenopathy.  Imaging through the pelvis shows no free intraperitoneal fluid. There is no pelvic sidewall lymphadenopathy.  Diverticular changes are seen in the colon without evidence for diverticulitis. Terminal ileum and appendix are normal.  IMPRESSION: Evidence of pancreatic necrosis and phegmon within the pancreatic body and probable evolving pseudocyst in the central mesentery. Mass effect from the phlegmon generates substantial attenuation of the superior mesenteric and splenic veins as they track into the portosplenic confluence.  The portal vein remains patent at this time.   Original Report Authenticated By: Kennith Center, M.D.    Dg Abd 2 Views  06/09/2012  *RADIOLOGY REPORT*  Clinical Data: Abdominal distention.  Evaluate stool burden.  ABDOMEN - 2 VIEW  Comparison: Abdominal CT 05/31/2012  Findings: The feeding tube is in the region of the proximal jejunum.  There is contrast throughout the colon with at least mild to moderate stool in the right colon region.  There is gas within the small bowel loops but no significant small bowel gaseous distention.  There is gas and stool in the rectum. Densities at the right lung base could represent atelectasis and cannot exclude pleural fluid.  IMPRESSION: There is oral contrast and stool within the colon.  No evidence for bowel obstruction.  Feeding tube in the jejunum.   Original Report Authenticated By: Richarda Overlie, M.D.     Anti-infectives: Anti-infectives   Start     Dose/Rate Route Frequency Ordered Stop   05/31/12 1400  ciprofloxacin (CIPRO) IVPB 400 mg  Status:  Discontinued     400 mg 200 mL/hr over 60 Minutes Intravenous Every 12 hours 05/31/12 1323 06/10/12 1449   05/31/12 1400  metroNIDAZOLE (FLAGYL) IVPB 500 mg  Status:  Discontinued     500 mg 100 mL/hr over 60 Minutes Intravenous Every 8 hours 05/31/12 1323 06/10/12 1449       Assessment/Plan 1. Necrotizing Pancreatitis with pseudocysts evolution: per Dr. Michaell Cowing "If worsening: NPO, TNA, start Primaxin (only Abx to be of utility in necrotizing pancreatitis). Could reconsider CT but HOLD OFF ON ANY PERC DRAINAGE until discuss with surgery. Most likely would be pseudocysts that will heal on their own vs internal drainage if persists after 6 weeks of Dx. ?role of ERCP to r/o / Tx stricture seen on CT - prob hold off since LFTs better than admit, but still follow."  Spoke with attending team today, they are planning to convert meds to IV and are trying clears.  We will continue to follow pt .  No new recs from surgical standpoint otherwise.   LOS: 12 days    Destry Bezdek 06/11/2012

## 2012-06-11 NOTE — Progress Notes (Signed)
Physical Therapy Treatment Patient Details Name: Justin [name] MRN: [id number] DOB: 1955/08/14 Today's Date: 06/11/2012 Time: 4098-1191 PT Time Calculation (min): 26 min  PT Assessment / Plan / Recommendation Comments on Treatment Session  Pt adm with necrotizing pancreatitis.  Pt making steady progress.    Follow Up Recommendations  Home health PT;Supervision/Assistance - 24 hour     Does the patient have the potential to tolerate intense rehabilitation     Barriers to Discharge        Equipment Recommendations  Rolling walker with 5" wheels    Recommendations for Other Services    Frequency Min 3X/week   Plan Discharge plan needs to be updated;Frequency remains appropriate    Precautions / Restrictions Precautions Precautions: Fall   Pertinent Vitals/Pain See flow sheet.    Mobility  Bed Mobility Bed Mobility: Supine to Sit;Sitting - Scoot to Edge of Bed Supine to Sit: 5: Supervision;HOB elevated Sitting - Scoot to Edge of Bed: 5: Supervision Details for Bed Mobility Assistance: Incr time Transfers Sit to Stand: 4: Min assist;With upper extremity assist;With armrests;From bed Stand to Sit: 4: Min assist;With upper extremity assist;With armrests;To chair/3-in-1 Details for Transfer Assistance: assist to bring hips up Ambulation/Gait Ambulation/Gait Assistance: 4: Min assist Ambulation Distance (Feet): 80 Feet (80 x 1, 40 x 1) Assistive device: Rolling walker Ambulation/Gait Assistance Details: Verbal cues to stand more erect. 1 sitting rest break. Gait Pattern: Step-through pattern;Decreased step length - right;Decreased step length - left;Trunk flexed Gait velocity: slow    Exercises General Exercises - Upper Extremity Shoulder Flexion: AAROM;10 reps;Both;Seated General Exercises - Lower Extremity Ankle Circles/Pumps: AROM;Both;10 reps;Seated Heel Slides: Strengthening;Both;10 reps;Seated   PT Diagnosis:    PT Problem List:   PT Treatment Interventions:      PT Goals Acute Rehab PT Goals PT Goal: Supine/Side to Sit - Progress: Progressing toward goal PT Goal: Sit to Supine/Side - Progress: Progressing toward goal PT Goal: Sit to Stand - Progress: Progressing toward goal PT Goal: Stand to Sit - Progress: Progressing toward goal PT Goal: Ambulate - Progress: Progressing toward goal  Visit Information  Last PT Received On: 06/11/12 Assistance Needed: +1    Subjective Data  Subjective: "I didn't walk yesterday," pt states.  Explained that we had walked in hall yesterday but pt doesn't remember it.   Cognition  Cognition Overall Cognitive Status: Impaired Area of Impairment: Memory Arousal/Alertness: Awake/alert Orientation Level: Appears intact for tasks assessed Behavior During Session: [facility] for tasks performed Memory Deficits: Didn't remember walking with me in hall yesterday (? due to pain meds).    Balance  Static Standing Balance Static Standing - Balance Support: Bilateral upper extremity supported;During functional activity Static Standing - Level of Assistance: 5: Stand by assistance  End of Session PT - End of Session Equipment Utilized During Treatment: Gait belt Activity Tolerance: Patient limited by fatigue Patient left: in chair;with call bell/phone within reach Nurse Communication: Mobility status   GP     [facility] 06/11/2012, 3:43 PM  [facility] PT [phone]

## 2012-06-11 NOTE — Clinical Documentation Improvement (Signed)
Hypertension Documentation Clarification Query  THIS DOCUMENT IS NOT A PERMANENT PART OF THE MEDICAL RECORD  TO RESPOND TO THE THIS QUERY, FOLLOW THE INSTRUCTIONS BELOW:  1. If needed, update documentation for the patient's encounter via the notes activity.  2. Access this query again and click edit on the In [employer].  3. After updating, or not, click F2 to complete all highlighted (required) fields concerning your review. Select "additional documentation in the medical record" OR "no additional documentation provided".  4. Click Sign note button.  5. The deficiency will fall out of your In Basket *Please let us know if you are not able to complete this workflow by phone or e-mail (listed below).        06/11/12  Dear Dr. Jerral Ralph Marton Redwood  In an effort to better capture your patient's severity of illness, reflect appropriate length of stay and utilization of resources, a review of the patient medical record has revealed the following indicators.    Based on your clinical judgment, please clarify and document in a progress note and/or discharge summary the clinical condition associated with the following supporting information:  In responding to this query please exercise your independent judgment.  The fact that a query is asked, does not imply that any particular answer is desired or expected.  Possible Clinical Conditions?   " Accelerated Hypertension  " Malignant Hypertension  " Or Other Condition __________________________  " Cannot Clinically Determine   Supporting Information:  Risk Factors: History of hypertension, stroke, high cholesterol, & MI  Signs and Symptoms: SBP & DBP range: 3/20 = 175-144/102-88 3/21 = 170-144/112-87 3/22 = 157-129/93-73 3/23 = 174-134/102-76 3/24 = 183-136/110-86 3/25 = 145-112/89-61   Treatment: Metroprolol 25mg  po bid Hydralazine 10mg  IV q6h prn if SBP >180 Vital signs q4h  You may use possible, probable, or suspect  with inpatient documentation. Possible, probable, suspected diagnoses MUST be documented at the time of discharge.  Reviewed:  no additional documentation provided  Thank You,  Harless Litten RN, MSN Clinical Documentation Specialist: Office# [phone] [facility] Health Information Management [place]

## 2012-06-11 NOTE — Progress Notes (Signed)
Justin [name] Daily Rounding Note 06/11/2012, 10:09 AM  SUBJECTIVE:       Pain exacerbated after first meal of CL, he consumed maybe 1/2 of this and within 20 minutes had pain increase to 10/10 but no nausea.  Liquid stools, not frequent. 3 doses of Miralax in previous 2 days.   OBJECTIVE:         Vital signs in last 24 hours:    Temp:  [97.8 F (36.6 C)-98.6 F (37 C)] 97.8 F (36.6 C) (04/01 0645) Pulse Rate:  [77-100] 92 (04/01 0645) Resp:  [18] 18 (04/01 0645) BP: (110-136)/(70-86) 117/78 mmHg (04/01 0645) SpO2:  [94 %-96 %] 94 % (04/01 0645) Weight:  [75.5 kg (166 lb 7.2 oz)] 75.5 kg (166 lb 7.2 oz) (04/01 0645) Last BM Date: 06/10/12 General: resting quietly, looks unwell but not currently toxic or uncomfortable.    Heart: tachycardic again.  Rate in low 100s. Chest: clear.  No cough or labored breathing Abdomen: distended, tender mid and upper abdomen.   No rebound.  BS hypoactive.   Extremities: no CCE.  Feet warm Neuro/Psych:  Cooperative, more depressed and flat today.   Intake/Output from previous day: 03/31 0701 - 04/01 0700 In: 450 [NG/GT:450] Out: 800 [Urine:800]  Intake/Output this shift:    Lab Results:  Recent Labs  06/09/12 0554 06/11/12 0650  WBC 12.9* 14.6*  HGB 12.2* 14.2  HCT 37.8* 41.0  PLT 302 333   BMET  Recent Labs  06/09/12 0554 06/11/12 0650  NA 135 138  K 4.2 4.3  CL 98 100  CO2 25 30  GLUCOSE 111* 128*  BUN 11 14  CREATININE 0.64 0.95  CALCIUM 8.8 9.4   LFT  Recent Labs  06/09/12 0554  PROT 6.9  ALBUMIN 2.7*  AST 12  ALT 28  ALKPHOS 161*  BILITOT 0.4    Studies/Results: Ct Abdomen Pelvis W Contrast 06/10/2012   Findings: imaging through the lung bases shows bibasilar collapse / consolidation with small left pleural effusion.  Tiny low-density lesions in the liver are stable.  Intrahepatic biliary duct dilatation has decreased in the interval.  Spleen is unremarkable.  Feeding tube tip is transpyloric, in the  proximal jejunum.  5.0 x 5.7 cm low density collection identified in the retroperitoneum at the level of the pancreatic body.  There is no enhancing pancreatic parenchyma in the body the pancreas, consistent with pancreatic necrosis and phlegmonous change within the retroperitoneal space.  1.9 x 5.1 cm rim enhancing fluid collection anterior to the pancreatic tail is compatible with evolving pseudocyst.  There is fluid in the root of the small bowel mesentery.  These changes generate significant mass effect on the superior mesenteric vein and splenic vein, near the portosplenic confluence.  The splenic vein is only a string sign as it enters the confluence.  Multiple tiny stones are seen in the kidneys bilaterally without urinary obstruction.  No abdominal aortic aneurysm.  No retroperitoneal lymphadenopathy.  Imaging through the pelvis shows no free intraperitoneal fluid. There is no pelvic sidewall lymphadenopathy.  Diverticular changes are seen in the colon without evidence for diverticulitis. Terminal ileum and appendix are normal.  IMPRESSION: Evidence of pancreatic necrosis and phegmon within the pancreatic body and probable evolving pseudocyst in the central mesentery. Mass effect from the phlegmon generates substantial attenuation of the superior mesenteric and splenic veins as they track into the portosplenic confluence.  The portal vein remains patent at this time.   Original Report Authenticated  By: Kennith Center, M.D.     ASSESMENT: *  Acute pancreatitis, recurrent. 3/31 follow up CT showing ongoing pancreatic body necrosis, phlegmon, mass effect on SMV and splenic veins.  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   LFTs near normal.  Latest Lipase was 8 days ago.    Pt suffered pain setback after clears this AM  PLAN: *  Per Dr Michaell Cowing:  trial enteral nutrition, stop cipro/flagyl and add Primaxin only if clinically worsens, substitute Fentanyl for Morphine, resist surgical intervention, pursue lap assisted  internal drainage of pseudocyst if it persists beyond 6 weeks.  ? Role of ERCP to rule out biliary stricture and intervene (stent) if needed? *  I DCd  the clears, would not change to cyclic, nocturnal TF just yet.     *  Agree with d/c of abx.               LOS: 12 days   Jennye Moccasin  06/11/2012, 10:09 AM Pager: [phone]   I have taken an interval history, reviewed the chart and examined the patient. I agree with the Advanced Practitioner's note, impression and recommendations. CT finding reviewed showing persist necrosis, phlegmon and likely a developing pseudocyst. Agree with Dr. Gordy Savers recommendations. Intrahepatic biliary dilation has decreased and LFTs are near normal, only a mild alk phos elevation. Would not pursue ERCP at this time. Would consider EUS +/- ERCP to further evaluate in a few weeks depending on clinical course.   Venita Lick. Russella Dar MD Clementeen Graham

## 2012-06-11 NOTE — Progress Notes (Addendum)
Pt feeling better.  Still w occ crampy pain.  Now loose BM on bowel regimen.  CT with central pseudocyst pushing up towards stomach.  No gas = no abscess  Nutrition: OK to try enteral route as tolerated.  Post-ampullary feeding at goal - consider cycling to night only. Clears OK - try adv diet slowly.  With pseudocyst compressing stomach, pt will have early satiety  - would recommended grazing with many smaller meals.  Add pancreatic enzymes.  If worse (worsening abd pain, inc wbc, N/V/Diarrhea) then make NPO & TNA and then retry PO in 1-2 weeks.  Pain control: D/C morphine - ampullary spasms - switch to fentanyl. Tylenol TID.  PO narcotics OK on bowel regimen  Work towards improving condition with therapy   No antibiotics - no benefit of cipro/flagyl in setting of pancreatitis. No evidence of infection elsewhere.  If worsening: NPO, TNA, start Primaxin (only Abx to be of utility in necrotizing pancreatitis).   Resist any surgical or other interventions at this time.  HOLD OFF ON ANY DRAINAGE until discuss with surgery.  Most likely the pseudocyst that will heal on their own.  If pseduocyst persists after 6 weeks of Dx, then patient would benefit from surgical internal grainage with alp-assisted transgastric pseudocyst-gastrostomy which I would be happt to do if needed  ?role of ERCP to r/o / Tx stricture seen on CT - prob hold off since LFTs better than admit, but still follow.  D/w [facility] (Dr. Jerral Ralph).  Poss LTAC transfer.  Warned him that [facility] not following pts in [facility] anymore but can follow as outpt w GI.

## 2012-06-12 LAB — CBC
MCHC: 34.6 g/dL (ref 30.0–36.0)
RDW: 14.8 % (ref 11.5–15.5)
WBC: 14.2 10*3/uL — ABNORMAL HIGH (ref 4.0–10.5)

## 2012-06-12 LAB — COMPREHENSIVE METABOLIC PANEL
ALT: 19 U/L (ref 0–53)
Alkaline Phosphatase: 141 U/L — ABNORMAL HIGH (ref 39–117)
BUN: 17 mg/dL (ref 6–23)
CO2: 28 mEq/L (ref 19–32)
Calcium: 8.6 mg/dL (ref 8.4–10.5)
GFR calc Af Amer: >90 mL/min (ref >90)
GFR calc non Af Amer: >90 mL/min (ref >90)
Glucose, Bld: 136 mg/dL — ABNORMAL HIGH (ref 70–99)
Potassium: 3.6 mEq/L (ref 3.5–5.1)
Sodium: 138 mEq/L (ref 135–145)

## 2012-06-12 LAB — GLUCOSE, CAPILLARY: Glucose-Capillary: 132 mg/dL — ABNORMAL HIGH (ref 70–99)

## 2012-06-12 MED ORDER — ACETAMINOPHEN 500 MG PO TABS
1000.0000 mg | ORAL_TABLET | Freq: Four times a day (QID) | ORAL | Status: DC | PRN
  Filled 2012-06-12: qty 2

## 2012-06-12 MED ORDER — ASPIRIN 81 MG PO CHEW
81.0000 mg | CHEWABLE_TABLET | Freq: Every day | ORAL | Status: DC
  Administered 2012-06-12 – 2012-06-14 (×3): 81 mg via ORAL
  Filled 2012-06-12 (×3): qty 1

## 2012-06-12 MED ORDER — SODIUM CHLORIDE 0.9 % IV SOLN
INTRAVENOUS | Status: DC
  Administered 2012-06-12 – 2012-06-14 (×4): via INTRAVENOUS
  Filled 2012-06-12 (×7): qty 1000

## 2012-06-12 NOTE — Progress Notes (Signed)
     [facility] Daily Rounding Note 06/12/2012, 9:34 AM  SUBJECTIVE:       No nausea, pain back to baseline of 6/10.  OBJECTIVE:         Vital signs in last 24 hours:    Temp:  [97.4 F (36.3 C)-98.6 F (37 C)] 97.4 F (36.3 C) (04/02 0500) Pulse Rate:  [82-117] 82 (04/02 0500) Resp:  [18-20] 20 (04/02 0500) BP: (104-129)/(70-82) 113/70 mmHg (04/02 0500) SpO2:  [95 %-98 %] 98 % (04/02 0500) Weight:  [75.796 kg (167 lb 1.6 oz)] 75.796 kg (167 lb 1.6 oz) (04/02 0500) Last BM Date: 06/11/12 General: looks better than yesterday.   Heart: RRR Chest: clear B Abdomen: Moderately tense, tender diffusely without guard or rebound.  Extremities: no edema Neuro/Psych:  Alert, in good spirits, no deficits.   Intake/Output from previous day: 04/01 0701 - 04/02 0700 In: 122 [P.O.:122] Out: 400 [Urine:400]  Intake/Output this shift:    Lab Results:  Recent Labs  06/11/12 0650 06/12/12 0445  WBC 14.6* 14.2*  HGB 14.2 12.8*  HCT 41.0 37.0*  PLT 333 328   BMET  Recent Labs  06/11/12 0650 06/12/12 0445  NA 138 138  K 4.3 3.6  CL 100 101  CO2 30 28  GLUCOSE 128* 136*  BUN 14 17  CREATININE 0.95 0.83  CALCIUM 9.4 8.6   LFT  Recent Labs  06/12/12 0445  PROT 6.7  ALBUMIN 2.7*  AST 8  ALT 19  ALKPHOS 141*  BILITOT 0.3   Studies/Results: Ct Abdomen Pelvis W Contrast 06/10/2012  IMPRESSION: Evidence of pancreatic necrosis and phegmon within the pancreatic body and probable evolving pseudocyst in the central mesentery. Mass effect from the phlegmon generates substantial attenuation of the superior mesenteric and splenic veins as they track into the portosplenic confluence.  The portal vein remains patent at this time.   Original Report Authenticated By: Kennith Center, M.D.     ASSESMENT: * Acute pancreatitis, recurrent. 3/31 follow up CT showing pancreatic body necrosis, phlegmon, mass effect on SMV and splenic veins. 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  Did not tolerate clear  liquid tray 4/1:  Exacerbated pain.   Pain today back to baseline of 6/10.  WBCs stable, no fever *  Hyperglycemia.     PLAN: *  Going to allow sips of clears (Sprite, apple juice.Marland Kitchen)   LOS: 13 days   Justin [name]  06/12/2012, 9:34 AM Pager: [phone]   I have taken an interval history, reviewed the chart and examined the patient. I agree with the Advanced Practitioner's note, impression and recommendations.   Venita Lick. Russella Dar MD Clementeen Graham

## 2012-06-12 NOTE — Progress Notes (Signed)
PATIENT DETAILS Name: Justin [name] Age: 57 y.o. Sex: male Date of Birth: November 15, 1955 Admit Date: 05/30/2012 Admitting Physician Vania Rea, MD PCP:No primary provider on file.  Subjective: Reports some abdominal swelling and crampy pain.  No BM since yesterday.  Assessment/Plan: Active Problems:  Severe necrotizing pancreatitis with phlegmon. Etiology not readily evident-question of either a distal CBD stone (felt unlikely ) or a biliary stricture- current plans are for continued conservative management-and EUS when much more stable LFTs have normalized WBC is stable.  This is likely due to pancreatic inflamation. No longer on antibiotics Pain medications are Fentanyl (allergy to dilaudid) and percocet. GI and CCS following NG tube with post pyloric tube feeding is at goal Patient on ice chips (even clears caused severe pain on 4/1).  Will remain NPO for at least a few days before retrial of clears.  Abdominal Distention Beginning to re-occur.  (Earlier inducing bowel movements helped). Last stool was yesterday. Added IVF with Potassium at 75 ml/hour Ambulate patient at least TID and up to chair.  No carbonated beverages.  Systemic inflammation response syndrome This has resolved with pancreatitis slowly resolving  Hypokalemia Resolved Monitor electrolytes closely  Hypertension Was on scheduled IV metoprolol and IV hydralazine-now on metoprolol orally-we'll use IV hydralazine as needed. BP stable  Dyslipidemia Holding statins for now- resume when able or discharge  History of coronary artery disease status post CABG Currently stable Resume aspirin today.- did have pancreatic hemorrhage seen on MRCP   Hypothyroidism Continue with Synthroid IV  Fibromyalgia  Well compensated at this time  Depression On Cymbalta  Disposition: -Remain inpatient  DVT Prophylaxis: Prophylactic Lovenox  Code Status: Full code   Procedures:  None  CONSULTS:  GI and  general surgery  PHYSICAL EXAM: Vital signs in last 24 hours: Filed Vitals:   06/11/12 1356 06/11/12 1643 06/11/12 2105 06/12/12 0500  BP: 104/72 119/80 129/82 113/70  Pulse: 117 112 97 82  Temp: 98.2 F (36.8 C) 98.6 F (37 C) 98.3 F (36.8 C) 97.4 F (36.3 C)  TempSrc: Oral Oral  Oral  Resp: 18 18 18 20   Height:      Weight:    75.796 kg (167 lb 1.6 oz)  SpO2: 96% 98% 96% 98%    Weight change: 0.296 kg (10.4 oz) Body mass index is 26.57 kg/(m^2).   Gen Exam: Awake and alert with clear speech.  Winces with crampy pain when he moves. Neck: Supple, No JVD.  No thyromegaly Chest: CTA no w/c/r  CVS: S1 S2 Regular, no murmurs.  Abdomen: becoming firmer again, more distended, tender to palpation in the epigastrum, + bowel sounds  Extremities: no edema, lower extremities warm to touch. Neurologic: Non Focal.   Skin: No Rash, bruises or lesions   Intake/Output from previous day:  Intake/Output Summary (Last 24 hours) at 06/12/12 0858 Last data filed at 06/11/12 1656  Gross per 24 hour  Intake    122 ml  Output    400 ml  Net   -278 ml     LAB RESULTS: CBC  Recent Labs Lab 06/06/12 0445 06/08/12 0600 06/09/12 0554 06/11/12 0650 06/12/12 0445  WBC 15.9* 11.2* 12.9* 14.6* 14.2*  HGB 13.1 12.0* 12.2* 14.2 12.8*  HCT 36.3* 34.2* 37.8* 41.0 37.0*  PLT 233 277 302 333 328  MCV 81.8 82.2 85.3 83.3 83.1  MCH 29.5 28.8 27.5 28.9 28.8  MCHC 36.1* 35.1 32.3 34.6 34.6  RDW 15.1 15.6* 15.4 15.3 14.8    Chemistries  Recent Labs Lab 06/07/12 0645 06/08/12 0600 06/09/12 0554 06/11/12 0650 06/12/12 0445  NA 135 134* 135 138 138  K 3.9 3.9 4.2 4.3 3.6  CL 102 99 98 100 101  CO2 25 25 25 30 28   GLUCOSE 142* 169* 111* 128* 136*  BUN 12 13 11 14 17   CREATININE 0.70 0.71 0.64 0.95 0.83  CALCIUM 8.2* 8.4 8.8 9.4 8.6  MG 2.0  --  2.2  --   --     RADIOLOGY STUDIES/RESULTS: Dg Abd 1 View  06/04/2012  *RADIOLOGY REPORT*  Clinical Data: Feeding tube placement   ABDOMEN - 1 VIEW  Comparison: None.  Fluoroscopy time: 18 seconds  Findings: Weighted feeding tube has been placed in the proximal duodenum just past the ligament of Treitz.  IMPRESSION: Weighted feeding tube terminates in the proximal duodenum.   Original Report Authenticated By: Charline Bills, M.D.    Ct Abdomen Pelvis W Contrast  05/31/2012  *RADIOLOGY REPORT*  Clinical Data: Acute pancreatitis, mid abdominal pain  CT ABDOMEN AND PELVIS WITH CONTRAST  Technique:  Multidetector CT imaging of the abdomen and pelvis was performed following the standard protocol during bolus administration of intravenous contrast.  Contrast: 50mL OMNIPAQUE IOHEXOL 300 MG/ML  SOLN, 1 OMNIPAQUE IOHEXOL 300 MG/ML  SOLN, OMNIPAQUE IOHEXOL 300 MG/ML  SOLN  Comparison: Chest and abdomen films of 05/30/2012  Findings: There is bibasilar linear atelectasis or scarring present.  No effusion is seen. There does appear to be a small hiatal hernia present.  The liver enhances and there is prominence of the central intrahepatic ducts.  Surgical clips are present from prior cholecystectomy.  However there is marked edema of the neck and proximal body of the pancreas with considerable peripancreatic exudate consistent with acute pancreatitis.  The common bile duct is somewhat prominent measuring 9 mm in diameter but no definite calculus is seen. Stricture or mass cannot be excluded although no obvious mass is evident.  The fact that the head of the pancreatic parenchyma enhances as does the distal body and tail, but there is no enhancement of the distal head and proximal body is worrisome for pancreatic necrosis.  There is edema of the adjacent descending duodenum and distal stomach.  No discrete abscess or pseudocyst is evident.  The adrenal glands and spleen are unremarkable.  The stomach is moderately distended with contrast with no gross abnormality other than the previously noted edema.  The kidneys enhance with several small  nonobstructing renal calculi noted bilaterally.  The abdominal aorta is normal in caliber.  No adenopathy is seen.  Pancreatic exudate extends into the right retroperitoneum.  There is also a moderate amount of free fluid noted layering within the pelvis.  The urinary bladder is unremarkable.  The prostate is normal in size.  Scattered rectosigmoid colonic diverticula are seen.  No abnormality of the terminal ileum or appendix is noted. Degenerative change is noted involving the facet joints of L4-5 and L5-S1.  IMPRESSION:  1.  Acute pancreatitis with edema of the distal head and proximal body the pancreas and considerable surrounding exudate extending into the pelvis.  The lack of enhancement of the distal head and proximal body the pancreas is worrisome for pancreatic necrosis. No abscess or pseudocyst is evident currently. 2.  Prominent common bile duct and intrahepatic ducts may be due to edema but a non-visualized distal calculus, stricture or mass cannot be excluded as the etiology. 3.  Moderate amount of free fluid in the pelvis. 4.  Small hiatal hernia. 5.  Small nonobstructing bilateral renal calculi.   Original Report Authenticated By: Dwyane Dee, M.D.    Mr 3d Recon At Scanner  06/02/2012  *RADIOLOGY REPORT*  Clinical Data:  Pancreatitis and biliary dilatation.  Elevated liver function tests.  MRI ABDOMEN WITHOUT AND WITH CONTRAST (INCLUDING MRCP)  Technique:  Multiplanar multisequence MR imaging of the abdomen was performed both before and after the administration of intravenous contrast. Heavily T2-weighted images of the biliary and pancreatic ducts were obtained, and three-dimensional MRCP images were rendered by post processing.  Contrast: 20 ml Multihance  Comparison:  05/31/2012  Findings:  Despite efforts by the patient and technologist, motion artifact is present on some series of today's examination and could not be totally eliminated.  This reduces diagnostic sensitivity and specificity.   Trace bilateral pleural effusions noted with mesenteric edema and mild perihepatic and perisplenic ascites.  Edema tracks within and around the pancreas, compatible with pancreatitis.  Gallbladder absent.  The common bile duct measures a maximum of 6 mm on today's examination.  On image 15 of series 3, the portion of the common bile duct adjacent to the pancreatic head is somewhat irregular, and there is an appearance of abrupt truncation of the CBD adjacent to the ampulla on that same image. This abrupt truncation is shown on the T2-weighted images, but on the postcontrast images such as images 66-77 of series 1504, there appears to be a focal stricturing or extrinsic narrowing of the CBD but with a small channel communicating with the ampulla  Abnormal high precontrast T1 signal in the pancreatic body is shown on images 48-68 of series 1500, compatible with hemorrhage. This corresponds to a lack of enhancement in the pancreatic body highly concerning for pancreatic necrosis, as noted on the prior CT scan.  IMPRESSION: 1.  The common bile duct currently only measures 6 mm in diameter, but there is focal irregular narrowing in the CBD along the pancreatic head, and also focal narrowing in the distal CBD in the vicinity of the ampulla. Loss of the normal conical distal CBD tapering.  T2-weighted images suggest an abrupt truncation of the distal-most CBD as can be seen with obstructing stone, but the postcontrast images seem to demonstrate a small channel connecting through to the ampulla raising the possibility that the distal CBD stenosis may be from stricturing or extrinsic compression related to the underlying pancreatitis.  Differentiation is not helped by the degree of motion artifact. 2.  Pancreatic necrosis of the pancreatic body, with associated hemorrhage in this portion of the pancreas.  3.  Trace bilateral pleural effusions with adjacent atelectasis; mesenteric edema along with perihepatic and perisplenic  ascites. Peripancreatic edema pattern with acute pancreatitis.   Original Report Authenticated By: Gaylyn Rong, M.D.    Dg Abd Acute W/chest  05/30/2012  *RADIOLOGY REPORT*  Clinical Data: Abdominal pain and vomiting.  ACUTE ABDOMEN SERIES (ABDOMEN 2 VIEW & CHEST 1 VIEW)  Comparison: 12/05/2008  Findings: Lungs are clear. Heart and mediastinum are within normal limits.  Trachea is midline.  No evidence of free air.  Nonspecific bowel gas pattern.  Surgical clips in the right upper abdomen. Degenerative changes in the lower lumbar spine.  Phleboliths in the pelvis.  IMPRESSION: No acute findings.   Original Report Authenticated By: Richarda Overlie, M.D.    Dg Naso G Tube Plc W/fl-no Rad  06/04/2012  CLINICAL DATA:    NASO G TUBE PLACEMENT WITH FLUORO  Fluoroscopy was utilized by the requesting physician.  No radiographic  interpretation.     Mr Abd W/wo Cm/mrcp  06/02/2012  *RADIOLOGY REPORT*  Clinical Data:  Pancreatitis and biliary dilatation.  Elevated liver function tests.  MRI ABDOMEN WITHOUT AND WITH CONTRAST (INCLUDING MRCP)  Technique:  Multiplanar multisequence MR imaging of the abdomen was performed both before and after the administration of intravenous contrast. Heavily T2-weighted images of the biliary and pancreatic ducts were obtained, and three-dimensional MRCP images were rendered by post processing.  Contrast: 20 ml Multihance  Comparison:  05/31/2012  Findings:  Despite efforts by the patient and technologist, motion artifact is present on some series of today's examination and could not be totally eliminated.  This reduces diagnostic sensitivity and specificity.  Trace bilateral pleural effusions noted with mesenteric edema and mild perihepatic and perisplenic ascites.  Edema tracks within and around the pancreas, compatible with pancreatitis.  Gallbladder absent.  The common bile duct measures a maximum of 6 mm on today's examination.  On image 15 of series 3, the portion of the common  bile duct adjacent to the pancreatic head is somewhat irregular, and there is an appearance of abrupt truncation of the CBD adjacent to the ampulla on that same image. This abrupt truncation is shown on the T2-weighted images, but on the postcontrast images such as images 66-77 of series 1504, there appears to be a focal stricturing or extrinsic narrowing of the CBD but with a small channel communicating with the ampulla  Abnormal high precontrast T1 signal in the pancreatic body is shown on images 48-68 of series 1500, compatible with hemorrhage. This corresponds to a lack of enhancement in the pancreatic body highly concerning for pancreatic necrosis, as noted on the prior CT scan.  IMPRESSION: 1.  The common bile duct currently only measures 6 mm in diameter, but there is focal irregular narrowing in the CBD along the pancreatic head, and also focal narrowing in the distal CBD in the vicinity of the ampulla. Loss of the normal conical distal CBD tapering.  T2-weighted images suggest an abrupt truncation of the distal-most CBD as can be seen with obstructing stone, but the postcontrast images seem to demonstrate a small channel connecting through to the ampulla raising the possibility that the distal CBD stenosis may be from stricturing or extrinsic compression related to the underlying pancreatitis.  Differentiation is not helped by the degree of motion artifact. 2.  Pancreatic necrosis of the pancreatic body, with associated hemorrhage in this portion of the pancreas.  3.  Trace bilateral pleural effusions with adjacent atelectasis; mesenteric edema along with perihepatic and perisplenic ascites. Peripancreatic edema pattern with acute pancreatitis.   Original Report Authenticated By: Gaylyn Rong, M.D.    US Abdomen Limited Ruq  05/31/2012  *RADIOLOGY REPORT*  Clinical Data: Pancreatitis.  Elevated LFTs.  LIMITED ABDOMINAL ULTRASOUND  Comparison:  05/31/2012 CT.  Findings:  Gallbladder:  Post  cholecystectomy.  Common bile duct:  Only the proximal aspect is well delineated secondary to bowel gas with the common bile duct measuring up to 9.3 mm.  Mid to distal common bile duct stone cannot be excluded based on present exam.  Liver:  Dilated intrahepatic biliary ducts.  Free fluid noted.  Limit evaluation of the pancreas.  Please see recent CT report.  IMPRESSION: Post cholecystectomy.  Dilated proximal common bile duct measuring up to 9.3 mm.   Mid to distal common bile duct not visualized secondary to bowel gas, mid to distal common bile duct stone can not be excluded.  Dilated  intrahepatic biliary ducts.   Original Report Authenticated By: Lacy Duverney, M.D.     MEDICATIONS: Scheduled Meds: . antiseptic oral rinse  15 mL Mouth Rinse q12n4p  . chlorhexidine  15 mL Mouth Rinse BID  . DULoxetine  60 mg Oral Daily  . enoxaparin (LOVENOX) injection  40 mg Subcutaneous Q24H  . levothyroxine  50 mcg Intravenous QAC breakfast  . lip balm   Topical BID  . lipase/protease/amylase  2 capsule Oral TID AC  . metoprolol tartrate  25 mg Oral BID  . pantoprazole (PROTONIX) IV  40 mg Intravenous Q24H  . sodium chloride  3 mL Intravenous Q12H   Continuous Infusions: . feeding supplement (VITAL AF 1.2 CAL) 1,000 mL (06/11/12 2243)  . sodium chloride 0.9 % 1,000 mL with potassium chloride 20 mEq infusion     PRN Meds:.acetaminophen, bisacodyl, diphenhydrAMINE, diphenhydrAMINE, fentaNYL, hydrALAZINE, magic mouthwash, menthol-cetylpyridinium, nitroGLYCERIN, ondansetron (ZOFRAN) IV, oxyCODONE, phenol, polyethylene glycol  Antibiotics: Anti-infectives   Start     Dose/Rate Route Frequency Ordered Stop   05/31/12 1400  ciprofloxacin (CIPRO) IVPB 400 mg  Status:  Discontinued     400 mg 200 mL/hr over 60 Minutes Intravenous Every 12 hours 05/31/12 1323 06/10/12 1449   05/31/12 1400  metroNIDAZOLE (FLAGYL) IVPB 500 mg  Status:  Discontinued     500 mg 100 mL/hr over 60 Minutes Intravenous Every 8  hours 05/31/12 1323 06/10/12 [address] [phone] [facility]  If 7PM-7AM, please contact night-coverage [email or web address] Password [email or web address] 06/12/2012, 8:58 AM   LOS: 13 days

## 2012-06-12 NOTE — Progress Notes (Signed)
Nutrition: OK to try enteral route as tolerated. Post-ampullary feeding at goal - consider cycling to night only. Clears OK - try adv diet slowly. With pseudocyst compressing stomach, pt will have early satiety - would recommended grazing with many smaller meals. Add pancreatic enzymes. If worse (worsening abd pain, inc wbc, N/V/Diarrhea) then make NPO & TNA and then retry PO in 1-2 weeks.   Pain control:  fentanyl. Tylenol TID. PO narcotics OK on bowel regimen   Work towards improving condition with therapy   No antibiotics   Resist any surgical or other interventions at this time. HOLD OFF ON ANY DRAINAGE until discuss with surgery. Most likely the pseudocyst that will heal on their own. If pseduocyst persists after 6 weeks of Dx, then patient would benefit from surgical internal grainage with alp-assisted transgastric pseudocyst-gastrostomy which I would be happt to do if needed   ?role of ERCP to r/o / Tx stricture seen on CT - prob hold off since LFTs better than admit, but agree w GI to follow clinically for now

## 2012-06-12 NOTE — Progress Notes (Signed)
Patient ID: Justin [name], male   DOB: Jul 30, 1955, 57 y.o.   MRN: [id number]    Subjective: No acute changes, still having crampy abdominal pain.  C/o loose stools  Objective: Vital signs in last 24 hours: Temp:  [97.4 F (36.3 C)-98.6 F (37 C)] 97.4 F (36.3 C) (04/02 0500) Pulse Rate:  [82-117] 82 (04/02 0500) Resp:  [18-20] 20 (04/02 0500) BP: (104-129)/(70-82) 113/70 mmHg (04/02 0500) SpO2:  [95 %-98 %] 98 % (04/02 0500) Weight:  [167 lb 1.6 oz (75.796 kg)] 167 lb 1.6 oz (75.796 kg) (04/02 0500) Last BM Date: 06/11/12  Intake/Output from previous day: 04/01 0701 - 04/02 0700 In: 122 [P.O.:122] Out: 400 [Urine:400] Intake/Output this shift:    PE: Abd: mildly distended, pain in epigastric and LUQ with palp, +BS General: awake, alert, NAD  Lab Results:   Recent Labs  06/11/12 0650 06/12/12 0445  WBC 14.6* 14.2*  HGB 14.2 12.8*  HCT 41.0 37.0*  PLT 333 328   BMET  Recent Labs  06/11/12 0650 06/12/12 0445  NA 138 138  K 4.3 3.6  CL 100 101  CO2 30 28  GLUCOSE 128* 136*  BUN 14 17  CREATININE 0.95 0.83  CALCIUM 9.4 8.6   PT/INR No results found for this basename: LABPROT, INR,  in the last 72 hours CMP     Component Value Date/Time   NA 138 06/12/2012 0445   K 3.6 06/12/2012 0445   CL 101 06/12/2012 0445   CO2 28 06/12/2012 0445   GLUCOSE 136* 06/12/2012 0445   BUN 17 06/12/2012 0445   CREATININE 0.83 06/12/2012 0445   CALCIUM 8.6 06/12/2012 0445   PROT 6.7 06/12/2012 0445   ALBUMIN 2.7* 06/12/2012 0445   AST 8 06/12/2012 0445   ALT 19 06/12/2012 0445   ALKPHOS 141* 06/12/2012 0445   BILITOT 0.3 06/12/2012 0445   GFRNONAA >90 06/12/2012 0445   GFRAA >90 06/12/2012 0445   Lipase     Component Value Date/Time   LIPASE 222* 06/02/2012 0545       Studies/Results: Ct Abdomen Pelvis W Contrast  06/10/2012  *RADIOLOGY REPORT*  Clinical Data: Pancreatitis.  The upper quadrant pain.  Nausea vomiting.  CT ABDOMEN AND PELVIS WITH CONTRAST  Technique:  Multidetector CT  imaging of the abdomen and pelvis was performed following the standard protocol during bolus administration of intravenous contrast.  Contrast: 80mL OMNIPAQUE IOHEXOL 300 MG/ML  SOLN  Comparison: 318/21/14  Findings: imaging through the lung bases shows bibasilar collapse / consolidation with small left pleural effusion.  Tiny low-density lesions in the liver are stable.  Intrahepatic biliary duct dilatation has decreased in the interval.  Spleen is unremarkable.  Feeding tube tip is transpyloric, in the proximal jejunum.  5.0 x 5.7 cm low density collection identified in the retroperitoneum at the level of the pancreatic body.  There is no enhancing pancreatic parenchyma in the body the pancreas, consistent with pancreatic necrosis and phlegmonous change within the retroperitoneal space.  1.9 x 5.1 cm rim enhancing fluid collection anterior to the pancreatic tail is compatible with evolving pseudocyst.  There is fluid in the root of the small bowel mesentery.  These changes generate significant mass effect on the superior mesenteric vein and splenic vein, near the portosplenic confluence.  The splenic vein is only a string sign as it enters the confluence.  Multiple tiny stones are seen in the kidneys bilaterally without urinary obstruction.  No abdominal aortic aneurysm.  No retroperitoneal lymphadenopathy.  Imaging through the pelvis shows no free intraperitoneal fluid. There is no pelvic sidewall lymphadenopathy.  Diverticular changes are seen in the colon without evidence for diverticulitis. Terminal ileum and appendix are normal.  IMPRESSION: Evidence of pancreatic necrosis and phegmon within the pancreatic body and probable evolving pseudocyst in the central mesentery. Mass effect from the phlegmon generates substantial attenuation of the superior mesenteric and splenic veins as they track into the portosplenic confluence.  The portal vein remains patent at this time.   Original Report Authenticated By: Kennith Center, M.D.     Anti-infectives: Anti-infectives   Start     Dose/Rate Route Frequency Ordered Stop   05/31/12 1400  ciprofloxacin (CIPRO) IVPB 400 mg  Status:  Discontinued     400 mg 200 mL/hr over 60 Minutes Intravenous Every 12 hours 05/31/12 1323 06/10/12 1449   05/31/12 1400  metroNIDAZOLE (FLAGYL) IVPB 500 mg  Status:  Discontinued     500 mg 100 mL/hr over 60 Minutes Intravenous Every 8 hours 05/31/12 1323 06/10/12 1449       Assessment/Plan 1. Necrotizing Pancreatitis with pseudocysts evolution: per Dr. Michaell Cowing "If worsening: NPO, TNA, start Primaxin (only Abx to be of utility in necrotizing pancreatitis). Could reconsider CT but HOLD OFF ON ANY PERC DRAINAGE until discuss with surgery. Most likely would be pseudocysts that will heal on their own vs internal drainage if persists after 6 weeks of Dx.  Per GI:  Would not pursue ERCP at this time. Would consider EUS +/- ERCP to further evaluate in a few weeks depending on clinical course.  Feed very slowly with goal of going to a low fat diet, if fails make NPO and start TNA per Dr. Michaell Cowing   LOS: 13 days    Lashawn Bromwell, [facility] 06/12/2012

## 2012-06-12 NOTE — Progress Notes (Signed)
I have reviewed the data and plan as above. - Patient did not tolerate orals. Continue n.p.o. continue enteral feedings. - He did have a bowel movement. A low threshold for C. Difficile. - Continue to replete electrolytes as needed

## 2012-06-13 ENCOUNTER — Inpatient Hospital Stay (HOSPITAL_COMMUNITY): Payer: Medicare Other

## 2012-06-13 DIAGNOSIS — K859 Acute pancreatitis without necrosis or infection, unspecified: Secondary | ICD-10-CM

## 2012-06-13 DIAGNOSIS — E876 Hypokalemia: Secondary | ICD-10-CM

## 2012-06-13 DIAGNOSIS — R109 Unspecified abdominal pain: Secondary | ICD-10-CM

## 2012-06-13 LAB — BASIC METABOLIC PANEL
Calcium: 8.5 mg/dL (ref 8.4–10.5)
GFR calc Af Amer: >90 mL/min (ref >90)
GFR calc non Af Amer: >90 mL/min (ref >90)
Sodium: 137 mEq/L (ref 135–145)

## 2012-06-13 MED ORDER — VITAL AF 1.2 CAL PO LIQD: 1000.0000 mL | ORAL | Status: DC

## 2012-06-13 MED ORDER — HYDROCODONE-ACETAMINOPHEN 5-325 MG PO TABS: 1.0000 | ORAL_TABLET | Freq: Three times a day (TID) | ORAL | Status: DC | PRN

## 2012-06-13 MED ORDER — PANCRELIPASE (LIP-PROT-AMYL) 12000-38000 UNITS PO CPEP: 2.0000 | ORAL_CAPSULE | Freq: Three times a day (TID) | ORAL | Status: DC

## 2012-06-13 MED ORDER — BISACODYL 10 MG RE SUPP: 10.0000 mg | Freq: Every day | RECTAL | Status: DC | PRN

## 2012-06-13 MED ORDER — METOPROLOL TARTRATE 25 MG PO TABS
25.0000 mg | ORAL_TABLET | Freq: Once | ORAL | Status: AC
  Administered 2012-06-13: 25 mg via ORAL
  Filled 2012-06-13: qty 1

## 2012-06-13 MED ORDER — ASPIRIN 81 MG PO CHEW: 81.0000 mg | CHEWABLE_TABLET | Freq: Every day | ORAL | Status: DC

## 2012-06-13 NOTE — Progress Notes (Signed)
PATIENT DETAILS Name: Justin [name] Age: 57 y.o. Sex: male Date of Birth: 1955/11/22 Admit Date: 05/30/2012 Admitting Physician Vania Rea, MD PCP:No primary provider on file.  Subjective: Unable to tolerate clears.  No complaints, no BM yesterday or today.  Assessment/Plan: Active Problems:  Severe necrotizing pancreatitis with phlegmon. Etiology unclear-current plans are for continued conservative management-and EUS when much more stable LFTs have normalized GI and CCS following Reposition PANDA post pyloric by IR. clears caused severe pain on 4/1).   Per GI patient to stay in a managed medical care setting due to likelihood of quick on-set SIRS.   Inpatient vs SNF. CT Scan on Monday 06/17/12  Abdominal Distention Beginning to re-occur.  (Earlier inducing bowel movements helped). Last stool was 4/1 Added IVF with Potassium at 75 ml/hour Ambulate patient at least TID and up to chair.  No carbonated beverages.  Systemic inflammation response syndrome This has resolved with pancreatitis slowly resolving  Hypokalemia Resolved Monitor electrolytes closely  Hypertension on metoprolol orally-we'll use IV hydralazine as needed. BP stable  Dyslipidemia Holding statins for now- resume when able or discharge  History of coronary artery disease status post CABG Currently stable Resume aspirin today.- did have pancreatic hemorrhage seen on MRCP   Hypothyroidism Continue with Synthroid IV  Fibromyalgia  Well compensated at this time  Depression On Cymbalta  Disposition: Remain inpatient will have a CT scan on Monday to assess if he's improved / safe for discharge to SNF.  DVT Prophylaxis: Prophylactic Lovenox  Code Status: Full code   Procedures:  None  CONSULTS:  GI and general surgery  PHYSICAL EXAM: Vital signs in last 24 hours: Filed Vitals:   06/13/12 0548 06/13/12 1033 06/13/12 1044 06/13/12 1200  BP: 99/60 115/72 106/68 113/75  Pulse: 83 95  90 85  Temp: 98 F (36.7 C) 98.5 F (36.9 C)  99 F (37.2 C)  TempSrc: Oral Oral  Oral  Resp: 20   18  Height:      Weight: 75.8 kg (167 lb 1.7 oz)     SpO2: 97% 97%  98%    Weight change: 0.004 kg (0.1 oz) Body mass index is 26.57 kg/(m^2).   Gen Exam: Awake and alert with clear speech.  N/G in place. Patient very pleasant. Neck: Supple, No JVD.  No thyromegaly Chest: CTA no w/c/r  CVS: S1 S2 Regular, no murmurs.  Abdomen: becoming firmer again, more distended, tender to palpation in the epigastrum, + bowel sounds  Extremities: no edema, lower extremities warm to touch. Neurologic: Non Focal.   Skin: No Rash, bruises or lesions   Intake/Output from previous day:  Intake/Output Summary (Last 24 hours) at 06/13/12 1519 Last data filed at 06/13/12 0900  Gross per 24 hour  Intake   1460 ml  Output   1475 ml  Net    -15 ml     LAB RESULTS: CBC  Recent Labs Lab 06/08/12 0600 06/09/12 0554 06/11/12 0650 06/12/12 0445  WBC 11.2* 12.9* 14.6* 14.2*  HGB 12.0* 12.2* 14.2 12.8*  HCT 34.2* 37.8* 41.0 37.0*  PLT 277 302 333 328  MCV 82.2 85.3 83.3 83.1  MCH 28.8 27.5 28.9 28.8  MCHC 35.1 32.3 34.6 34.6  RDW 15.6* 15.4 15.3 14.8    Chemistries   Recent Labs Lab 06/07/12 0645 06/08/12 0600 06/09/12 0554 06/11/12 0650 06/12/12 0445 06/13/12 0619  NA 135 134* 135 138 138 137  K 3.9 3.9 4.2 4.3 3.6 3.8  CL 102 99 98 100  101 103  CO2 25 25 25 30 28 25   GLUCOSE 142* 169* 111* 128* 136* 117*  BUN 12 13 11 14 17 15   CREATININE 0.70 0.71 0.64 0.95 0.83 0.82  CALCIUM 8.2* 8.4 8.8 9.4 8.6 8.5  MG 2.0  --  2.2  --   --   --     RADIOLOGY STUDIES/RESULTS: Dg Abd 1 View  06/04/2012  *RADIOLOGY REPORT*  Clinical Data: Feeding tube placement  ABDOMEN - 1 VIEW  Comparison: None.  Fluoroscopy time: 18 seconds  Findings: Weighted feeding tube has been placed in the proximal duodenum just past the ligament of Treitz.  IMPRESSION: Weighted feeding tube terminates in the  proximal duodenum.   Original Report Authenticated By: Charline Bills, M.D.    Ct Abdomen Pelvis W Contrast  05/31/2012  *RADIOLOGY REPORT*  Clinical Data: Acute pancreatitis, mid abdominal pain  CT ABDOMEN AND PELVIS WITH CONTRAST  Technique:  Multidetector CT imaging of the abdomen and pelvis was performed following the standard protocol during bolus administration of intravenous contrast.  Contrast: 50mL OMNIPAQUE IOHEXOL 300 MG/ML  SOLN, 1 OMNIPAQUE IOHEXOL 300 MG/ML  SOLN, OMNIPAQUE IOHEXOL 300 MG/ML  SOLN  Comparison: Chest and abdomen films of 05/30/2012  Findings: There is bibasilar linear atelectasis or scarring present.  No effusion is seen. There does appear to be a small hiatal hernia present.  The liver enhances and there is prominence of the central intrahepatic ducts.  Surgical clips are present from prior cholecystectomy.  However there is marked edema of the neck and proximal body of the pancreas with considerable peripancreatic exudate consistent with acute pancreatitis.  The common bile duct is somewhat prominent measuring 9 mm in diameter but no definite calculus is seen. Stricture or mass cannot be excluded although no obvious mass is evident.  The fact that the head of the pancreatic parenchyma enhances as does the distal body and tail, but there is no enhancement of the distal head and proximal body is worrisome for pancreatic necrosis.  There is edema of the adjacent descending duodenum and distal stomach.  No discrete abscess or pseudocyst is evident.  The adrenal glands and spleen are unremarkable.  The stomach is moderately distended with contrast with no gross abnormality other than the previously noted edema.  The kidneys enhance with several small nonobstructing renal calculi noted bilaterally.  The abdominal aorta is normal in caliber.  No adenopathy is seen.  Pancreatic exudate extends into the right retroperitoneum.  There is also a moderate amount of free fluid noted  layering within the pelvis.  The urinary bladder is unremarkable.  The prostate is normal in size.  Scattered rectosigmoid colonic diverticula are seen.  No abnormality of the terminal ileum or appendix is noted. Degenerative change is noted involving the facet joints of L4-5 and L5-S1.  IMPRESSION:  1.  Acute pancreatitis with edema of the distal head and proximal body the pancreas and considerable surrounding exudate extending into the pelvis.  The lack of enhancement of the distal head and proximal body the pancreas is worrisome for pancreatic necrosis. No abscess or pseudocyst is evident currently. 2.  Prominent common bile duct and intrahepatic ducts may be due to edema but a non-visualized distal calculus, stricture or mass cannot be excluded as the etiology. 3.  Moderate amount of free fluid in the pelvis. 4.  Small hiatal hernia. 5.  Small nonobstructing bilateral renal calculi.   Original Report Authenticated By: Dwyane Dee, M.D.    Mr 3d  Recon At Scanner  06/02/2012  *RADIOLOGY REPORT*  Clinical Data:  Pancreatitis and biliary dilatation.  Elevated liver function tests.  MRI ABDOMEN WITHOUT AND WITH CONTRAST (INCLUDING MRCP)  Technique:  Multiplanar multisequence MR imaging of the abdomen was performed both before and after the administration of intravenous contrast. Heavily T2-weighted images of the biliary and pancreatic ducts were obtained, and three-dimensional MRCP images were rendered by post processing.  Contrast: 20 ml Multihance  Comparison:  05/31/2012  Findings:  Despite efforts by the patient and technologist, motion artifact is present on some series of today's examination and could not be totally eliminated.  This reduces diagnostic sensitivity and specificity.  Trace bilateral pleural effusions noted with mesenteric edema and mild perihepatic and perisplenic ascites.  Edema tracks within and around the pancreas, compatible with pancreatitis.  Gallbladder absent.  The common bile duct  measures a maximum of 6 mm on today's examination.  On image 15 of series 3, the portion of the common bile duct adjacent to the pancreatic head is somewhat irregular, and there is an appearance of abrupt truncation of the CBD adjacent to the ampulla on that same image. This abrupt truncation is shown on the T2-weighted images, but on the postcontrast images such as images 66-77 of series 1504, there appears to be a focal stricturing or extrinsic narrowing of the CBD but with a small channel communicating with the ampulla  Abnormal high precontrast T1 signal in the pancreatic body is shown on images 48-68 of series 1500, compatible with hemorrhage. This corresponds to a lack of enhancement in the pancreatic body highly concerning for pancreatic necrosis, as noted on the prior CT scan.  IMPRESSION: 1.  The common bile duct currently only measures 6 mm in diameter, but there is focal irregular narrowing in the CBD along the pancreatic head, and also focal narrowing in the distal CBD in the vicinity of the ampulla. Loss of the normal conical distal CBD tapering.  T2-weighted images suggest an abrupt truncation of the distal-most CBD as can be seen with obstructing stone, but the postcontrast images seem to demonstrate a small channel connecting through to the ampulla raising the possibility that the distal CBD stenosis may be from stricturing or extrinsic compression related to the underlying pancreatitis.  Differentiation is not helped by the degree of motion artifact. 2.  Pancreatic necrosis of the pancreatic body, with associated hemorrhage in this portion of the pancreas.  3.  Trace bilateral pleural effusions with adjacent atelectasis; mesenteric edema along with perihepatic and perisplenic ascites. Peripancreatic edema pattern with acute pancreatitis.   Original Report Authenticated By: Gaylyn Rong, M.D.    Dg Abd Acute W/chest  05/30/2012  *RADIOLOGY REPORT*  Clinical Data: Abdominal pain and vomiting.   ACUTE ABDOMEN SERIES (ABDOMEN 2 VIEW & CHEST 1 VIEW)  Comparison: 12/05/2008  Findings: Lungs are clear. Heart and mediastinum are within normal limits.  Trachea is midline.  No evidence of free air.  Nonspecific bowel gas pattern.  Surgical clips in the right upper abdomen. Degenerative changes in the lower lumbar spine.  Phleboliths in the pelvis.  IMPRESSION: No acute findings.   Original Report Authenticated By: Richarda Overlie, M.D.    Dg Naso G Tube Plc W/fl-no Rad  06/04/2012  CLINICAL DATA:    NASO G TUBE PLACEMENT WITH FLUORO  Fluoroscopy was utilized by the requesting physician.  No radiographic  interpretation.     Mr Abd W/wo Cm/mrcp  06/02/2012  *RADIOLOGY REPORT*  Clinical Data:  Pancreatitis and  biliary dilatation.  Elevated liver function tests.  MRI ABDOMEN WITHOUT AND WITH CONTRAST (INCLUDING MRCP)  Technique:  Multiplanar multisequence MR imaging of the abdomen was performed both before and after the administration of intravenous contrast. Heavily T2-weighted images of the biliary and pancreatic ducts were obtained, and three-dimensional MRCP images were rendered by post processing.  Contrast: 20 ml Multihance  Comparison:  05/31/2012  Findings:  Despite efforts by the patient and technologist, motion artifact is present on some series of today's examination and could not be totally eliminated.  This reduces diagnostic sensitivity and specificity.  Trace bilateral pleural effusions noted with mesenteric edema and mild perihepatic and perisplenic ascites.  Edema tracks within and around the pancreas, compatible with pancreatitis.  Gallbladder absent.  The common bile duct measures a maximum of 6 mm on today's examination.  On image 15 of series 3, the portion of the common bile duct adjacent to the pancreatic head is somewhat irregular, and there is an appearance of abrupt truncation of the CBD adjacent to the ampulla on that same image. This abrupt truncation is shown on the T2-weighted images,  but on the postcontrast images such as images 66-77 of series 1504, there appears to be a focal stricturing or extrinsic narrowing of the CBD but with a small channel communicating with the ampulla  Abnormal high precontrast T1 signal in the pancreatic body is shown on images 48-68 of series 1500, compatible with hemorrhage. This corresponds to a lack of enhancement in the pancreatic body highly concerning for pancreatic necrosis, as noted on the prior CT scan.  IMPRESSION: 1.  The common bile duct currently only measures 6 mm in diameter, but there is focal irregular narrowing in the CBD along the pancreatic head, and also focal narrowing in the distal CBD in the vicinity of the ampulla. Loss of the normal conical distal CBD tapering.  T2-weighted images suggest an abrupt truncation of the distal-most CBD as can be seen with obstructing stone, but the postcontrast images seem to demonstrate a small channel connecting through to the ampulla raising the possibility that the distal CBD stenosis may be from stricturing or extrinsic compression related to the underlying pancreatitis.  Differentiation is not helped by the degree of motion artifact. 2.  Pancreatic necrosis of the pancreatic body, with associated hemorrhage in this portion of the pancreas.  3.  Trace bilateral pleural effusions with adjacent atelectasis; mesenteric edema along with perihepatic and perisplenic ascites. Peripancreatic edema pattern with acute pancreatitis.   Original Report Authenticated By: Gaylyn Rong, M.D.    US Abdomen Limited Ruq  05/31/2012  *RADIOLOGY REPORT*  Clinical Data: Pancreatitis.  Elevated LFTs.  LIMITED ABDOMINAL ULTRASOUND  Comparison:  05/31/2012 CT.  Findings:  Gallbladder:  Post cholecystectomy.  Common bile duct:  Only the proximal aspect is well delineated secondary to bowel gas with the common bile duct measuring up to 9.3 mm.  Mid to distal common bile duct stone cannot be excluded based on present exam.   Liver:  Dilated intrahepatic biliary ducts.  Free fluid noted.  Limit evaluation of the pancreas.  Please see recent CT report.  IMPRESSION: Post cholecystectomy.  Dilated proximal common bile duct measuring up to 9.3 mm.   Mid to distal common bile duct not visualized secondary to bowel gas, mid to distal common bile duct stone can not be excluded.  Dilated intrahepatic biliary ducts.   Original Report Authenticated By: Lacy Duverney, M.D.     MEDICATIONS: Scheduled Meds: . antiseptic oral rinse  15 mL Mouth Rinse q12n4p  . aspirin  81 mg Oral Daily  . chlorhexidine  15 mL Mouth Rinse BID  . DULoxetine  60 mg Oral Daily  . enoxaparin (LOVENOX) injection  40 mg Subcutaneous Q24H  . levothyroxine  50 mcg Intravenous QAC breakfast  . lip balm   Topical BID  . lipase/protease/amylase  2 capsule Oral TID AC  . metoprolol tartrate  25 mg Oral BID  . pantoprazole (PROTONIX) IV  40 mg Intravenous Q24H  . sodium chloride  3 mL Intravenous Q12H   Continuous Infusions: . feeding supplement (VITAL AF 1.2 CAL) 1,000 mL (06/12/12 1605)  . sodium chloride 0.9 % 1,000 mL with potassium chloride 20 mEq infusion 75 mL/hr at 06/13/12 1432   PRN Meds:.acetaminophen, bisacodyl, diphenhydrAMINE, diphenhydrAMINE, fentaNYL, hydrALAZINE, magic mouthwash, menthol-cetylpyridinium, nitroGLYCERIN, ondansetron (ZOFRAN) IV, oxyCODONE, phenol, polyethylene glycol  Antibiotics: Anti-infectives   Start     Dose/Rate Route Frequency Ordered Stop   05/31/12 1400  ciprofloxacin (CIPRO) IVPB 400 mg  Status:  Discontinued     400 mg 200 mL/hr over 60 Minutes Intravenous Every 12 hours 05/31/12 1323 06/10/12 1449   05/31/12 1400  metroNIDAZOLE (FLAGYL) IVPB 500 mg  Status:  Discontinued     500 mg 100 mL/hr over 60 Minutes Intravenous Every 8 hours 05/31/12 1323 06/10/12 [id number] [phone] [facility]  If 7PM-7AM, please contact night-coverage [email or web address] Password  [email or web address] 06/13/2012, 3:19 PM   LOS: 14 days

## 2012-06-13 NOTE — Progress Notes (Signed)
Nutrition: OK to try enteral route as tolerated. Post-ampullary feeding at goal - consider cycling to night only. Clears OK - no able to adv diet beyond sips. With pseudocyst compressing stomach, pt will have early satiety . Usingpancreatic enzymes. If worse (worsening abd pain, inc wbc, N/V/Diarrhea) then make NPO & TNA and then retry PO in 1-2 weeks.   Pain control: fentanyl. Tylenol TID. PO narcotics OK on bowel regimen   Work towards improving condition with therapy   No antibiotics   Resist any surgical or other interventions at this time. HOLD OFF ON ANY DRAINAGE until discuss with surgery. Most likely the pseudocyst that will heal on their own. If pseduocyst persists after 6 weeks of Dx, then patient would benefit from surgical internal drainage with lap-assisted transgastric pseudocyst-gastrostomy which I would be happy to do if needed.  Follow up CT in six weeks to see if pseudocysts is resolving.  Hold off on any surgery as long as resolving and goes away. ?role of ERCP to r/o or Tx stricture seen on CT - prob hold off since LFTs better than admit, but agree w GI to follow clinically for now

## 2012-06-13 NOTE — Progress Notes (Signed)
     [facility] Daily Rounding Note 06/13/2012, 8:42 AM  SUBJECTIVE:       Pain flared after drinking maybe 100 cc of clear, flat soda.  Heart rate flared to 105.  No vomiting or nausea.  OBJECTIVE:         Vital signs in last 24 hours:    Temp:  [97.9 F (36.6 C)-98.5 F (36.9 C)] 98 F (36.7 C) (04/03 0548) Pulse Rate:  [81-105] 83 (04/03 0548) Resp:  [18-20] 20 (04/03 0548) BP: (98-114)/(60-81) 99/60 mmHg (04/03 0548) SpO2:  [95 %-98 %] 97 % (04/03 0548) Weight:  [75.8 kg (167 lb 1.7 oz)] 75.8 kg (167 lb 1.7 oz) (04/03 0548) Last BM Date: 06/11/12 General: no diaphoresis, currently comfortable   Heart: RRR, no tachycardia Chest: clear B.  Dry cough.  Abdomen: soft, moderately tender in mid abdomen:  Non-focal.   Extremities: no CCE Neuro/Psych:  Pleasant, seems more depressed/defeated today.    Intake/Output from previous day: 04/02 0701 - 04/03 0700 In: 2306.3 [I.V.:1186.3; NG/GT:1120] Out: 600 [Urine:600]  Intake/Output this shift: Total I/O In: -  Out: 500 [Urine:500]  Lab Results:  Recent Labs  06/11/12 0650 06/12/12 0445  WBC 14.6* 14.2*  HGB 14.2 12.8*  HCT 41.0 37.0*  PLT 333 328   BMET  Recent Labs  06/11/12 0650 06/12/12 0445 06/13/12 0619  NA 138 138 137  K 4.3 3.6 3.8  CL 100 101 103  CO2 30 28 25   GLUCOSE 128* 136* 117*  BUN 14 17 15   CREATININE 0.95 0.83 0.82  CALCIUM 9.4 8.6 8.5    ASSESMENT: * Acute pancreatitis, recurrent. 3/31 follow up CT showing pancreatic body necrosis, phlegmon, mass effect on SMV and splenic veins. 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  Pain exacerbated by clear liquid tray 4/1and sips of clears 4/2.   No fevers * Hyperglycemia.     PLAN: *  Are there any nursing facilities that would accept pt with NGT?  Another option would be to send home with NGT and self priming/flushing FT pump with nursing visits.   The only thing keeping him in hospital is dependence on TF. If it can be arranged, he will need pain managed  with oral or topical meds.  As he is tolerating IV fentanyl, should we trial fentanyl patches?   LOS: 14 days   Jennye Moccasin  06/13/2012, 8:42 AM Pager: [phone]    I have taken an interval history, reviewed the chart and examined the patient. I agree with the Advanced Practitioner's note, impression and recommendations. Given pancreatic necrosis and dependence on TFs I would prefer that he remain in a supervised medical setting for managing at this time. Possibly a skilled nursing facility or long term care facility. Would plan to repeat a CT scan every 7-10 days to monitor progress. Can repeat sooner he has clinical deterioration. No role for ERCP at this time as there is a high risk for infection in the necrotic pancreatic tissue and forming pseudocyst.  Judie Petit T. Russella Dar MD [facility]

## 2012-06-13 NOTE — Progress Notes (Signed)
NUTRITION FOLLOW UP     Intervention:   1. Continue Vital AF 1.2 @ 70 ml/hr continuous. This EN regimen provide 2016 kcal, 126 grams protein, 1362 ml free water. This does not meet hydration needs. If pt does not have any IV fluids at d/c, will need to add a small free water flush, recommend 60 ml, 8 times daily (provides additional 480 ml daily).  2. RD will continue to follow    Nutrition Dx:   Inadequate oral intake related to inability to eat as evidenced by NPO status. Ongoing  Goal:   Intake to meet >/=90% estimated nutrition needs. Met   Monitor:   Weight trends, I/O's, PO tolerance, labs  Assessment:   Pt continues with NG tube feeding, Vital AF 1.2 @ 70 ml/hr. This is currently providing 2016 kcal, 126 grams protein, 1362 ml free water.  Discussed in rounds, pt planned to d/c home with NG tube in place and continue TF at home. Recommend pt continue current TF regimen. If TF is to continue for a long period of time home health agency can transition pt to nocturnal feeding if desired. Pt will not be able to transition to bolus as he has a post pyloric tube.   If pt does not have IV fluids at home, will need to add small free water boluses via tube to meet hydration needs.   Height: Ht Readings from Last 1 Encounters:  06/07/12 5' 6.5" (1.689 m)    Weight Status:   Wt Readings from Last 1 Encounters:  06/13/12 167 lb 1.7 oz (75.8 kg)    Re-estimated needs:  Kcal: 2000-2300 Protein: >/= 113 grams  Fluid: 2-2.4 L   Skin: intact   Diet Order:   NPO   Intake/Output Summary (Last 24 hours) at 06/13/12 1027 Last data filed at 06/13/12 0900  Gross per 24 hour  Intake 2306.25 ml  Output   1475 ml  Net 831.25 ml  I/O's stat pt is +79 L this admission?   Last BM: 4/1   Labs:   Recent Labs Lab 06/07/12 0645  06/09/12 0554 06/11/12 0650 06/12/12 0445 06/13/12 0619  NA 135  < > 135 138 138 137  K 3.9  < > 4.2 4.3 3.6 3.8  CL 102  < > 98 100 101 103  CO2 25   < > 25 30 28 25   BUN 12  < > 11 14 17 15   CREATININE 0.70  < > 0.64 0.95 0.83 0.82  CALCIUM 8.2*  < > 8.8 9.4 8.6 8.5  MG 2.0  --  2.2  --   --   --   PHOS 2.6  --  3.1  --   --   --   GLUCOSE 142*  < > 111* 128* 136* 117*  < > = values in this interval not displayed.  CBG (last 3)   Recent Labs  06/12/12 1206  GLUCAP 132*    Scheduled Meds: . antiseptic oral rinse  15 mL Mouth Rinse q12n4p  . aspirin  81 mg Oral Daily  . chlorhexidine  15 mL Mouth Rinse BID  . DULoxetine  60 mg Oral Daily  . enoxaparin (LOVENOX) injection  40 mg Subcutaneous Q24H  . levothyroxine  50 mcg Intravenous QAC breakfast  . lip balm   Topical BID  . lipase/protease/amylase  2 capsule Oral TID AC  . metoprolol tartrate  25 mg Oral BID  . pantoprazole (PROTONIX) IV  40 mg Intravenous Q24H  .  sodium chloride  3 mL Intravenous Q12H    Continuous Infusions: . feeding supplement (VITAL AF 1.2 CAL) 1,000 mL (06/12/12 1605)  . sodium chloride 0.9 % 1,000 mL with potassium chloride 20 mEq infusion 75 mL/hr at 06/13/12 0045    Clarene Duke RD, LDN Pager [phone] After Hours pager [phone]

## 2012-06-13 NOTE — Progress Notes (Signed)
Physical Therapy Treatment Patient Details Name: Justin [name] MRN: [id number] DOB: 1955-08-18 Today's Date: 06/13/2012 Time: 1308-6578 PT Time Calculation (min): 14 min  PT Assessment / Plan / Recommendation Comments on Treatment Session  Pt adm with necrotizing pancreatitis.  Pt's needs cannot be met at home and pt can benefit from PT at [facility].    Follow Up Recommendations  SNF     Does the patient have the potential to tolerate intense rehabilitation     Barriers to Discharge        Equipment Recommendations  Rolling walker with 5" wheels    Recommendations for Other Services    Frequency Min 3X/week   Plan Discharge plan needs to be updated;Frequency remains appropriate    Precautions / Restrictions Precautions Precautions: Fall   Pertinent Vitals/Pain SaO2 100% on RA with amb    Mobility  Bed Mobility Supine to Sit: 4: Min assist;HOB elevated Sitting - Scoot to Edge of Bed: 5: Supervision Details for Bed Mobility Assistance: Incr time Transfers Sit to Stand: 4: Min assist;With upper extremity assist;From bed Stand to Sit: 4: Min assist;With upper extremity assist;With armrests;To chair/3-in-1 Details for Transfer Assistance: assist to bring hips up Ambulation/Gait Ambulation/Gait Assistance: 4: Min assist Ambulation Distance (Feet): 150 Feet Assistive device: Rolling walker Ambulation/Gait Assistance Details: Verbal cues to stand more erect and look up. Gait Pattern: Step-through pattern;Decreased step length - right;Decreased step length - left;Trunk flexed Gait velocity: slow    Exercises     PT Diagnosis:    PT Problem List:   PT Treatment Interventions:     PT Goals Acute Rehab PT Goals PT Goal: Supine/Side to Sit - Progress: Progressing toward goal PT Goal: Sit to Supine/Side - Progress: Progressing toward goal PT Goal: Sit to Stand - Progress: Progressing toward goal PT Goal: Stand to Sit - Progress: Progressing toward goal PT Goal: Ambulate -  Progress: Progressing toward goal  Visit Information  Last PT Received On: 06/13/12 Assistance Needed: +1    Subjective Data  Subjective: Pt states he is feeling stronger.   Cognition  Cognition Overall Cognitive Status: Appears within functional limits for tasks assessed/performed Arousal/Alertness: Awake/alert Orientation Level: Appears intact for tasks assessed Behavior During Session: [facility] for tasks performed    Balance  Static Standing Balance Static Standing - Balance Support: Bilateral upper extremity supported;During functional activity Static Standing - Level of Assistance: 5: Stand by assistance  End of Session PT - End of Session Activity Tolerance: Patient tolerated treatment well Patient left: in chair;with call bell/phone within reach;with family/visitor present Nurse Communication: Mobility status   GP     Justin [name] 06/13/2012, 4:26 PM  [employer] PT [phone]

## 2012-06-13 NOTE — Progress Notes (Signed)
Subjective: Pt feeling about the same today.  Still in moderate pain throughout the abdomen.  Had significant nausea yesterday after a trial of clear liquids.  It also brought on significant pain.  Pt ambulating well, +Bm's, flatus, and urinating well.  Medicine is planning on sending the patient to SNF on TF's at some point.  IS at 2500.  Objective: Vital signs in last 24 hours: Temp:  [97.9 F (36.6 C)-98.5 F (36.9 C)] 98 F (36.7 C) (04/03 0548) Pulse Rate:  [81-105] 83 (04/03 0548) Resp:  [18-20] 20 (04/03 0548) BP: (98-114)/(60-81) 99/60 mmHg (04/03 0548) SpO2:  [95 %-98 %] 97 % (04/03 0548) Weight:  [167 lb 1.7 oz (75.8 kg)] 167 lb 1.7 oz (75.8 kg) (04/03 0548) Last BM Date: 06/11/12  Intake/Output from previous day: 04/02 0701 - 04/03 0700 In: 2306.3 [I.V.:1186.3; NG/GT:1120] Out: 600 [Urine:600] Intake/Output this shift: Total I/O In: -  Out: 500 [Urine:500]  PE: Gen:  Alert, NAD, pleasant Abd: Soft, tympanic, +distension, very tender to palpation >Epigastric area, +BS   Lab Results:   Recent Labs  06/11/12 0650 06/12/12 0445  WBC 14.6* 14.2*  HGB 14.2 12.8*  HCT 41.0 37.0*  PLT 333 328   BMET  Recent Labs  06/12/12 0445 06/13/12 0619  NA 138 137  K 3.6 3.8  CL 101 103  CO2 28 25  GLUCOSE 136* 117*  BUN 17 15  CREATININE 0.83 0.82  CALCIUM 8.6 8.5   PT/INR No results found for this basename: LABPROT, INR,  in the last 72 hours CMP     Component Value Date/Time   NA 137 06/13/2012 0619   K 3.8 06/13/2012 0619   CL 103 06/13/2012 0619   CO2 25 06/13/2012 0619   GLUCOSE 117* 06/13/2012 0619   BUN 15 06/13/2012 0619   CREATININE 0.82 06/13/2012 0619   CALCIUM 8.5 06/13/2012 0619   PROT 6.7 06/12/2012 0445   ALBUMIN 2.7* 06/12/2012 0445   AST 8 06/12/2012 0445   ALT 19 06/12/2012 0445   ALKPHOS 141* 06/12/2012 0445   BILITOT 0.3 06/12/2012 0445   GFRNONAA >90 06/13/2012 0619   GFRAA >90 06/13/2012 0619   Lipase     Component Value Date/Time   LIPASE 222*  06/02/2012 0545       Studies/Results: No results found.  Anti-infectives: Anti-infectives   Start     Dose/Rate Route Frequency Ordered Stop   05/31/12 1400  ciprofloxacin (CIPRO) IVPB 400 mg  Status:  Discontinued     400 mg 200 mL/hr over 60 Minutes Intravenous Every 12 hours 05/31/12 1323 06/10/12 1449   05/31/12 1400  metroNIDAZOLE (FLAGYL) IVPB 500 mg  Status:  Discontinued     500 mg 100 mL/hr over 60 Minutes Intravenous Every 8 hours 05/31/12 1323 06/10/12 1449       Assessment/Plan 1. Necrotizing Pancreatitis with pseudocysts evolution: 1.  If worsening: NPO, TNA, start Primaxin (only Abx to be of utility in necrotizing pancreatitis).  2.  Could reconsider CT in another 5 weeks 3.  HOLD OFF ON PERC DRAINAGE AS THIS WILL ACTUALLY HINDER HEALING. Most likely would be pseudocysts that will heal on their own vs internal drainage if persists after 6 weeks of Dx when the cyst wall is well formed with lap assisted transgastric pseudocyst-gastrostomy which Dr. Michaell Cowing would be happy to do at that point.  4.  GI not recommending ERCP at this time. Would consider EUS +/- ERCP to further evaluate in a few weeks depending on  clinical course.  5.  Would hold of on advancing diet for a while until pain is much better controlled.  He has had multiple trials of clears and every time he seems to have worsening symptoms.  Tolerating TF at goal rate.  Would continue these at SNF when discharged to keep his nutritional status up to allow for healing.    LOS: 14 days    DORT, Aundra Millet 06/13/2012, 9:12 AM Pager: [phone]

## 2012-06-13 NOTE — Progress Notes (Signed)
Patient does not have a NG tube for Lopressor to go down. Pt is willing to take PO lopressor for tonight until tube gets placed in the AM. Provider on call was notified and a one time dose of Lopressor was given and patient tolerated it well.

## 2012-06-14 ENCOUNTER — Other Ambulatory Visit (HOSPITAL_COMMUNITY): Payer: Medicare Other

## 2012-06-14 ENCOUNTER — Inpatient Hospital Stay (HOSPITAL_COMMUNITY): Payer: Medicare Other

## 2012-06-14 DIAGNOSIS — E782 Mixed hyperlipidemia: Secondary | ICD-10-CM

## 2012-06-14 LAB — BASIC METABOLIC PANEL
CO2: 25 mEq/L (ref 19–32)
Calcium: 8.9 mg/dL (ref 8.4–10.5)
Chloride: 105 mEq/L (ref 96–112)
Potassium: 3.8 mEq/L (ref 3.5–5.1)
Sodium: 140 mEq/L (ref 135–145)

## 2012-06-14 LAB — CBC
MCV: 83.2 fL (ref 78.0–100.0)
Platelets: 338 10*3/uL (ref 150–400)
RBC: 4.41 MIL/uL (ref 4.22–5.81)
WBC: 9.6 10*3/uL (ref 4.0–10.5)

## 2012-06-14 LAB — GLUCOSE, CAPILLARY
Glucose-Capillary: 91 mg/dL (ref 70–99)
Glucose-Capillary: 96 mg/dL (ref 70–99)

## 2012-06-14 MED ORDER — DICLOFENAC SODIUM 1 % TD GEL: 2.0000 g | Freq: Four times a day (QID) | TRANSDERMAL | Status: DC

## 2012-06-14 MED ORDER — OXYCODONE HCL 5 MG PO TABS: 5.0000 mg | ORAL_TABLET | ORAL | Status: DC | PRN

## 2012-06-14 MED ORDER — DICLOFENAC SODIUM 1 % TD GEL
2.0000 g | Freq: Four times a day (QID) | TRANSDERMAL | Status: DC
  Administered 2012-06-14 (×2): 2 g via TOPICAL
  Filled 2012-06-14: qty 100

## 2012-06-14 MED ORDER — FENTANYL 50 MCG/HR TD PT72
50.0000 ug | MEDICATED_PATCH | TRANSDERMAL | Status: DC
  Administered 2012-06-14: 50 ug via TRANSDERMAL
  Filled 2012-06-14: qty 1

## 2012-06-14 MED ORDER — CAPSAICIN 0.075 % EX CREA: TOPICAL_CREAM | Freq: Two times a day (BID) | CUTANEOUS | Status: DC

## 2012-06-14 MED ORDER — PANTOPRAZOLE SODIUM 40 MG PO TBEC
40.0000 mg | DELAYED_RELEASE_TABLET | Freq: Every day | ORAL | Status: DC
  Administered 2012-06-14: 40 mg via ORAL
  Filled 2012-06-14: qty 1

## 2012-06-14 MED ORDER — METOPROLOL TARTRATE 25 MG PO TABS: 25.0000 mg | ORAL_TABLET | Freq: Two times a day (BID) | ORAL | Status: DC

## 2012-06-14 MED ORDER — POLYETHYLENE GLYCOL 3350 17 G PO PACK: 17.0000 g | PACK | Freq: Every day | ORAL | Status: DC

## 2012-06-14 MED ORDER — POLYETHYLENE GLYCOL 3350 17 G PO PACK
17.0000 g | PACK | Freq: Every day | ORAL | Status: DC
  Administered 2012-06-14: 17 g via ORAL
  Filled 2012-06-14: qty 1

## 2012-06-14 MED ORDER — METOPROLOL TARTRATE 25 MG PO TABS
25.0000 mg | ORAL_TABLET | Freq: Two times a day (BID) | ORAL | Status: DC
  Administered 2012-06-14: 25 mg via ORAL
  Filled 2012-06-14 (×2): qty 1

## 2012-06-14 MED ORDER — IOHEXOL 300 MG/ML  SOLN
20.0000 mL | Freq: Once | INTRAMUSCULAR | Status: AC | PRN
  Administered 2012-06-14: 20 mL

## 2012-06-14 MED ORDER — JEVITY 1.2 CAL PO LIQD: 1000.0000 mL | ORAL | Status: DC

## 2012-06-14 MED ORDER — PANTOPRAZOLE SODIUM 40 MG PO TBEC: 40.0000 mg | DELAYED_RELEASE_TABLET | Freq: Every day | ORAL | Status: DC

## 2012-06-14 MED ORDER — CAPSAICIN 0.075 % EX CREA
TOPICAL_CREAM | Freq: Two times a day (BID) | CUTANEOUS | Status: DC
  Administered 2012-06-14: 12:00:00 via TOPICAL
  Filled 2012-06-14: qty 60

## 2012-06-14 MED ORDER — FENTANYL 50 MCG/HR TD PT72: 1.0000 | MEDICATED_PATCH | TRANSDERMAL | Status: DC

## 2012-06-14 NOTE — Progress Notes (Signed)
-   I have review the data and agree with plan as above.

## 2012-06-14 NOTE — Clinical Social Work Placement (Signed)
Clinical Social Work Department CLINICAL SOCIAL WORK PLACEMENT NOTE 06/14/2012  Patient:  Justin [name], Justin [name]  Account Number:  [id number] Admit date:  05/30/2012  Clinical Social Worker:  Genelle Bal, LCSW  Date/time:  06/14/2012 05:32 AM  Clinical Social Work is seeking post-discharge placement for this patient at the following level of care:   SKILLED NURSING   (*CSW will update this form in Epic as items are completed)   06/13/2012  Patient/family provided with [facility] Department of Clinical Social Work's list of facilities offering this level of care within the geographic area requested by the patient (or if unable, by the patient's family).  06/13/2012  Patient/family informed of their freedom to choose among providers that offer the needed level of care, that participate in Medicare, Medicaid or managed care program needed by the patient, have an available bed and are willing to accept the patient.    Patient/family informed of [facility]' ownership interest in [facility], as well as of the fact that they are under no obligation to receive care at this facility.  PASARR submitted to [employer] on 06/14/2012 PASARR number received from [employer] on 06/14/2012 - [id number]  FL2 transmitted to all facilities in geographic area requested by pt/family on  06/13/2012 FL2 transmitted to all facilities within larger geographic area on   Patient informed that his/her managed care company has contracts with or will negotiate with  certain facilities, including the following:     Patient/family informed of bed offers received:  06/14/2012 Patient chooses bed at [facility] Physician recommends and patient chooses bed at    Patient to be transferred to [facility] on  06/14/2012 Patient to be transferred to facility by ambulance  The following physician request were entered in Epic:   Additional Comments: 06/14/12: CSW talked with patient and girlfriend today  regarding bed offers. Ms. Cresenciano Genre talked with [employer] business office regarding co-pays. Discharge paperwork forwarded to facility.

## 2012-06-14 NOTE — Discharge Summary (Signed)
-   I have review the data and plan as above. - Continue tube feeding. - Try clears on 4.10.2014 - Follow up with surgery and GI as an outpatient. -

## 2012-06-14 NOTE — Progress Notes (Signed)
Pt. discharged to floor,verbalized understanding of discharged instruction,medication,restriction,diet and follow up appointment.Baseline Vitals sign stable,Pt comfortable,no sign and symptom of distress.Report given to [facility] to [facility] LPN.

## 2012-06-14 NOTE — Discharge Summary (Signed)
Physician Discharge Summary  [name] AVW:[id number] DOB: 1955-11-03 DOA: 05/30/2012  PCP: No primary provider on file.  Admit date: 05/30/2012 Discharge date: 06/14/2012  Time spent: 60 minutes  Recommendations for Outpatient Follow-up:  The patient needs Physical Therapy and ambulation He is on post pyloric continuous tube feeds and is NPO except sips of water with meds. He needs a CT Abdomen with Pancreatic protocol early next week.  He needs close follow up with Gastroenterology - Dr. Russella Dar of [facility] in [place] He needs to follow up with General Surgery - Dr. Gordy Savers of [facility].  Discharge Diagnoses:  Principal Problem:   Necrotizing pancreatitis Active Problems:   Back pain   Fibromyalgia   CAD (coronary artery disease) of artery bypass graft   Hypokalemia   Abnormal LFTs (liver function tests)   Unspecified hypothyroidism   Depressive disorder, not elsewhere classified   Common bile duct (CBD) ?stricture   Elevated triglycerides with high cholesterol   Pancreatic pseudocyst from acute pancreatitis Dx 06/10/2012   Discharge Condition: Stable, unable to tolerate an oral diet.  Diet recommendation: NPO with Jevity (either 1.2 or 1.0) Tube Feeds at 70 ml/hour continuous, and sips/chips.  Filed Weights   06/12/12 0500 06/13/12 0548 06/14/12 0413  Weight: 75.796 kg (167 lb 1.6 oz) 75.8 kg (167 lb 1.7 oz) 75.5 kg (166 lb 7.2 oz)    History of present illness at the time of admission on 3:  Justin [name] is an 57 y.o. male. Middle-aged gentleman, with a past history of cholecystectomy, and chronic back pain.  He denies a history of alcohol use, and presents with severe abdominal pain and vomiting since last night. Patient's wife has a habit of bringing him a milkshake every night, he was nauseous last night and after the milkshake got much worse. Since this morning he has been vomiting unable to keep anything down. No blood in the vomit no fever no  diarrhea, eventually because the pain was intolerable he came to the emergency room, where blood work revealed a markedly elevated lipase (>3000) and  liver function tests (AST 318, ALT of 431 and Alkaline Phosphatase 582, T.Bil 3.1). The hospitalist service was called to assist. Patient has required large amounts of narcotics for pain control, and because of a combination of ongoing severe pain and drowsiness from narcotics, and most of the history is filled out by his wife. He has chronic back pains due to disc problems, for which she sees Dr. August Saucer and Dr. Ethelene Hal, and is reported scheduled for surgery in the near future.  He takes Cymbalta and Vicodin as a part of his chronic pain program for back pain and fibromyalgia.   Hospital Course:   Acute Necrotizing Pancreatitis with Phlegmon - etiology undetermined. The patient was initially treated at [facility] by Dr. Jena Gauss.  Imaging studies showed marked edema of the neck and proximal body of the pancreas.  He was placed on cipro/flagyl as it was felt there was a necrotic component.  The CBD measured 9mm  MRCP/ERCP was recommended and the patient was transferred to [facility] for further care.  He received pain medicines and aggressive hydration and was made NPO.  Enteral feedings were started via a PANDA tube.  Both GI and General Surgery were consulted after the patient was transferred to [facility].  A Repeat CT scan was done that showed evidence of pancreatic necrosis and phlegmon within the pancreatic body and probable evolving pseudocyst in the central mesentery.  GI felt that ERCP was not appropriate at the current time but may be appropriate at some point in the future.  Surgery recommended resisting surgical or other interventions at the time.  "Hold off on any drainage" until after discussing it with Surgery (Dr. Michaell Cowing).  If pseudocyst persists after 6 weeks then patient would benefit from surgical internal drainage with lap-assisted transgastric  pseudocyst-gastrostomy.  Hold off on any surgery as long as the pseudocyst is resolving.    [name] diet was advanced 4X to clears but the patient would dry heave and develop severe pain each time.  (the last time was 4/2).  At this point it is felt better just to leave him NPO with meds and sip and chips.  Gastroenterology will determine when to advance his diet. It was decided that it was unsafe for him to go home as such a severe pancreatitis with necrosis can quickly turn into sepsis - he needed to be monitored in a medically managed setting. (Skilled Nursing Facility).  Abdominal Distention  Beginning to re-occur. (Earlier inducing bowel movements helped). Last stool was 4/1.  Miralax added back.  Ambulate patient at least TID and up to chair. No carbonated beverages.   Systemic inflammation response syndrome  Secondary to pancreatitis.  This has resolved.   Hypokalemia  Resolved.  Monitor electrolytes closely.  Hypertension  On metoprolol orally. BP stable   Low Back Pain  Patient has known spinal pathology.  Will resume voltaren gel. Will also trial Capsaicin cream bid.   Dyslipidemia  Holding statins for now- resume when able.    History of coronary artery disease status post CABG  Currently stable.  Resumed aspirin.  Hypothyroidism  Continue with Synthroid  Fibromyalgia  Well compensated at this time   Depression  On Cymbalta  Consultations:  Gastroenterology - Dr. Jena Gauss and Dr. Russella Dar  General Surgery - Dr. Gordy Savers  Discharge Exam: Filed Vitals:   06/13/12 2046 06/13/12 2359 06/14/12 0413 06/14/12 0955  BP: 134/90 107/63 120/85 130/81  Pulse: 95 71 84 91  Temp: 97.1 F (36.2 C) 98.4 F (36.9 C) 97.5 F (36.4 C)   TempSrc: Oral Oral Oral   Resp: 18 16 16    Height:      Weight:   75.5 kg (166 lb 7.2 oz)   SpO2: 96% 96% 96%    Gen Exam: Awake and alert with clear speech. N/G in place. Patient very pleasant. Hair net on  Neck: Supple, No JVD. No  thyromegaly  Chest: CTA no w/c/r  CVS: S1 S2 Regular, no murmurs.  Abdomen: becoming firmer again, distended, tender to palpation in the epigastrum, + bowel sounds  Extremities: no edema, lower extremities warm to touch.  Neurologic: Non Focal.  Skin: No Rash, bruises or lesions   Discharge Instructions  Discharge Orders   Future Orders Complete By Expires     Diet general  As directed     Comments:      NPO, except sips with meds.  On continuous post pyloric tube feeds.    Discharge instructions  As directed     Comments:      Patient needs a CT abdomen with pancreatic protocol on Monday 4/7.  This will need to be followed by  Gastroenterology. He has been seeing Dr. Claudette Head of [facility] as an inpatient.   He will also need to be followed by GI to determine when he can resume eating.    Increase activity slowly  As directed  Increase activity slowly  As directed         Medication List    STOP taking these medications       aspirin 325 MG tablet     esomeprazole 40 MG capsule  Commonly known as:  NEXIUM     gabapentin 300 MG capsule  Commonly known as:  NEURONTIN     simvastatin 80 MG tablet  Commonly known as:  ZOCOR      TAKE these medications       aspirin 81 MG chewable tablet  Chew 1 tablet (81 mg total) by mouth daily.     bisacodyl 10 MG suppository  Commonly known as:  DULCOLAX  Place 1 suppository (10 mg total) rectally daily as needed.     capsicum 0.075 % topical cream  Commonly known as:  ZOSTRIX  Apply topically 2 (two) times daily.     diclofenac sodium 1 % Gel  Commonly known as:  VOLTAREN  Apply 2 g topically 4 (four) times daily.     DULoxetine 60 MG capsule  Commonly known as:  CYMBALTA  Take 60 mg by mouth daily.     feeding supplement (VITAL AF 1.2 CAL) Liqd  Place 1,000 mLs into feeding tube continuous.     feeding supplement (JEVITY 1.2 CALor JEVITY 1.0 CAL) Liqd  Place 1,000 mLs into feeding tube continuous.      fentaNYL 50 MCG/HR  Commonly known as:  DURAGESIC - dosed mcg/hr  Place 1 patch (50 mcg total) onto the skin every 3 (three) days.     HYDROcodone-acetaminophen 5-325 MG per tablet  Commonly known as:  NORCO/VICODIN  Take 1 tablet by mouth every 8 (eight) hours as needed for pain.     levothyroxine 100 MCG tablet  Commonly known as:  SYNTHROID, LEVOTHROID  Take 100 mcg by mouth daily.     lipase/protease/amylase 16109 UNITS Cpep  Commonly known as:  CREON-10/PANCREASE  Take 2 capsules by mouth 3 (three) times daily before meals.     metoprolol tartrate 25 MG tablet  Commonly known as:  LOPRESSOR  Take 1 tablet (25 mg total) by mouth 2 (two) times daily.     nitroGLYCERIN 0.4 MG SL tablet  Commonly known as:  NITROSTAT  Place 0.4 mg under the tongue every 5 (five) minutes as needed for chest pain.     oxyCODONE 5 MG immediate release tablet  Commonly known as:  Oxy IR/ROXICODONE  Take 1-2 tablets (5-10 mg total) by mouth every 4 (four) hours as needed.     pantoprazole 40 MG tablet  Commonly known as:  PROTONIX  Take 1 tablet (40 mg total) by mouth daily at 6 (six) AM.     polyethylene glycol packet  Commonly known as:  MIRALAX / GLYCOLAX  Take 17 g by mouth daily.           Follow-up Information   Follow up with GROSS,STEVEN C., MD. Schedule an appointment as soon as possible for a visit in 3 weeks. (Be seen in 3-4 weeks for follow up CT scan and to determine if surgery will be necessary)    Contact information:   [address] [place] [address] [phone]       Follow up with Judie Petit T. Russella Dar, MD. Schedule an appointment as soon as possible for a visit in 2 weeks. (Pancreatitis follow up - reassess need for N/G tube feedings.)    Contact information:   [address]  [place] [address] [phone]        The results of significant diagnostics from this hospitalization (including imaging, microbiology, ancillary and laboratory) are  listed below for reference.    Significant Diagnostic Studies: Dg Abd 1 View  06/14/2012  *RADIOLOGY REPORT*  Clinical Data: Evaluate feeding tube position  ABDOMEN - 1 VIEW  Comparison: Abdomen film of 06/13/2012  Findings: The tip of the feeding tube is located just beyond the ligament of Treitz, documented by injection of a small amount of contrast.  The feeding tube was placed under fluoroscopy by radiologic technologist Tara Dingus.  IMPRESSION: Tip of feeding tube in good position just beyond the ligament of Treitz.   Original Report Authenticated By: Dwyane Dee, M.D.    Dg Abd 1 View  06/13/2012  *RADIOLOGY REPORT*  Clinical Data: Evaluate placement of feeding tube.  ABDOMEN - 1 VIEW  Comparison: 06/09/2012.  Findings: Metallic tip of a feeding tube appears coiled upon itself, likely in the region of the duodenal bulb.  The tube is likely to be transpyloric, with a portion of the tube extending into the second portion of the duodenum.  Proximally, the tube does appear coiled several times within the stomach.  Visualized bowel gas pattern is nonobstructive. Surgical clips project over the right upper quadrant of the abdomen, compatible with prior cholecystectomy.  IMPRESSION: The tip of the feeding tube appears to be in the region of the duodenal bulb, as above.   Original Report Authenticated By: Trudie Reed, M.D.    Dg Abd 1 View  06/04/2012  *RADIOLOGY REPORT*  Clinical Data: Feeding tube placement  ABDOMEN - 1 VIEW  Comparison: None.  Fluoroscopy time: 18 seconds  Findings: Weighted feeding tube has been placed in the proximal duodenum just past the ligament of Treitz.  IMPRESSION: Weighted feeding tube terminates in the proximal duodenum.   Original Report Authenticated By: Charline Bills, M.D.    Ct Abdomen Pelvis W Contrast  06/10/2012  *RADIOLOGY REPORT*  Clinical Data: Pancreatitis.  The upper quadrant pain.  Nausea vomiting.  CT ABDOMEN AND PELVIS WITH CONTRAST  Technique:   Multidetector CT imaging of the abdomen and pelvis was performed following the standard protocol during bolus administration of intravenous contrast.  Contrast: 80mL OMNIPAQUE IOHEXOL 300 MG/ML  SOLN  Comparison: 318/21/14  Findings: imaging through the lung bases shows bibasilar collapse / consolidation with small left pleural effusion.  Tiny low-density lesions in the liver are stable.  Intrahepatic biliary duct dilatation has decreased in the interval.  Spleen is unremarkable.  Feeding tube tip is transpyloric, in the proximal jejunum.  5.0 x 5.7 cm low density collection identified in the retroperitoneum at the level of the pancreatic body.  There is no enhancing pancreatic parenchyma in the body the pancreas, consistent with pancreatic necrosis and phlegmonous change within the retroperitoneal space.  1.9 x 5.1 cm rim enhancing fluid collection anterior to the pancreatic tail is compatible with evolving pseudocyst.  There is fluid in the root of the small bowel mesentery.  These changes generate significant mass effect on the superior mesenteric vein and splenic vein, near the portosplenic confluence.  The splenic vein is only a string sign as it enters the confluence.  Multiple tiny stones are seen in the kidneys bilaterally without urinary obstruction.  No abdominal aortic aneurysm.  No retroperitoneal lymphadenopathy.  Imaging through the pelvis shows no free intraperitoneal fluid. There is no pelvic sidewall lymphadenopathy.  Diverticular changes are seen in the colon without evidence for  diverticulitis. Terminal ileum and appendix are normal.  IMPRESSION: Evidence of pancreatic necrosis and phegmon within the pancreatic body and probable evolving pseudocyst in the central mesentery. Mass effect from the phlegmon generates substantial attenuation of the superior mesenteric and splenic veins as they track into the portosplenic confluence.  The portal vein remains patent at this time.   Original Report  Authenticated By: Kennith Center, M.D.    Ct Abdomen Pelvis W Contrast  05/31/2012  *RADIOLOGY REPORT*  Clinical Data: Acute pancreatitis, mid abdominal pain  CT ABDOMEN AND PELVIS WITH CONTRAST  Technique:  Multidetector CT imaging of the abdomen and pelvis was performed following the standard protocol during bolus administration of intravenous contrast.  Contrast: 50mL OMNIPAQUE IOHEXOL 300 MG/ML  SOLN, 1 OMNIPAQUE IOHEXOL 300 MG/ML  SOLN, OMNIPAQUE IOHEXOL 300 MG/ML  SOLN  Comparison: Chest and abdomen films of 05/30/2012  Findings: There is bibasilar linear atelectasis or scarring present.  No effusion is seen. There does appear to be a small hiatal hernia present.  The liver enhances and there is prominence of the central intrahepatic ducts.  Surgical clips are present from prior cholecystectomy.  However there is marked edema of the neck and proximal body of the pancreas with considerable peripancreatic exudate consistent with acute pancreatitis.  The common bile duct is somewhat prominent measuring 9 mm in diameter but no definite calculus is seen. Stricture or mass cannot be excluded although no obvious mass is evident.  The fact that the head of the pancreatic parenchyma enhances as does the distal body and tail, but there is no enhancement of the distal head and proximal body is worrisome for pancreatic necrosis.  There is edema of the adjacent descending duodenum and distal stomach.  No discrete abscess or pseudocyst is evident.  The adrenal glands and spleen are unremarkable.  The stomach is moderately distended with contrast with no gross abnormality other than the previously noted edema.  The kidneys enhance with several small nonobstructing renal calculi noted bilaterally.  The abdominal aorta is normal in caliber.  No adenopathy is seen.  Pancreatic exudate extends into the right retroperitoneum.  There is also a moderate amount of free fluid noted layering within the pelvis.  The urinary  bladder is unremarkable.  The prostate is normal in size.  Scattered rectosigmoid colonic diverticula are seen.  No abnormality of the terminal ileum or appendix is noted. Degenerative change is noted involving the facet joints of L4-5 and L5-S1.  IMPRESSION:  1.  Acute pancreatitis with edema of the distal head and proximal body the pancreas and considerable surrounding exudate extending into the pelvis.  The lack of enhancement of the distal head and proximal body the pancreas is worrisome for pancreatic necrosis. No abscess or pseudocyst is evident currently. 2.  Prominent common bile duct and intrahepatic ducts may be due to edema but a non-visualized distal calculus, stricture or mass cannot be excluded as the etiology. 3.  Moderate amount of free fluid in the pelvis. 4.  Small hiatal hernia. 5.  Small nonobstructing bilateral renal calculi.   Original Report Authenticated By: Dwyane Dee, M.D.    Mr 3d Recon At Scanner  06/02/2012  *RADIOLOGY REPORT*  Clinical Data:  Pancreatitis and biliary dilatation.  Elevated liver function tests.  MRI ABDOMEN WITHOUT AND WITH CONTRAST (INCLUDING MRCP)  Technique:  Multiplanar multisequence MR imaging of the abdomen was performed both before and after the administration of intravenous contrast. Heavily T2-weighted images of the biliary and pancreatic ducts were  obtained, and three-dimensional MRCP images were rendered by post processing.  Contrast: 20 ml Multihance  Comparison:  05/31/2012  Findings:  Despite efforts by the patient and technologist, motion artifact is present on some series of today's examination and could not be totally eliminated.  This reduces diagnostic sensitivity and specificity.  Trace bilateral pleural effusions noted with mesenteric edema and mild perihepatic and perisplenic ascites.  Edema tracks within and around the pancreas, compatible with pancreatitis.  Gallbladder absent.  The common bile duct measures a maximum of 6 mm on today's  examination.  On image 15 of series 3, the portion of the common bile duct adjacent to the pancreatic head is somewhat irregular, and there is an appearance of abrupt truncation of the CBD adjacent to the ampulla on that same image. This abrupt truncation is shown on the T2-weighted images, but on the postcontrast images such as images 66-77 of series 1504, there appears to be a focal stricturing or extrinsic narrowing of the CBD but with a small channel communicating with the ampulla  Abnormal high precontrast T1 signal in the pancreatic body is shown on images 48-68 of series 1500, compatible with hemorrhage. This corresponds to a lack of enhancement in the pancreatic body highly concerning for pancreatic necrosis, as noted on the prior CT scan.  IMPRESSION: 1.  The common bile duct currently only measures 6 mm in diameter, but there is focal irregular narrowing in the CBD along the pancreatic head, and also focal narrowing in the distal CBD in the vicinity of the ampulla. Loss of the normal conical distal CBD tapering.  T2-weighted images suggest an abrupt truncation of the distal-most CBD as can be seen with obstructing stone, but the postcontrast images seem to demonstrate a small channel connecting through to the ampulla raising the possibility that the distal CBD stenosis may be from stricturing or extrinsic compression related to the underlying pancreatitis.  Differentiation is not helped by the degree of motion artifact. 2.  Pancreatic necrosis of the pancreatic body, with associated hemorrhage in this portion of the pancreas.  3.  Trace bilateral pleural effusions with adjacent atelectasis; mesenteric edema along with perihepatic and perisplenic ascites. Peripancreatic edema pattern with acute pancreatitis.   Original Report Authenticated By: Gaylyn Rong, M.D.    Dg Abd 2 Views  06/09/2012  *RADIOLOGY REPORT*  Clinical Data: Abdominal distention.  Evaluate stool burden.  ABDOMEN - 2 VIEW   Comparison: Abdominal CT 05/31/2012  Findings: The feeding tube is in the region of the proximal jejunum.  There is contrast throughout the colon with at least mild to moderate stool in the right colon region.  There is gas within the small bowel loops but no significant small bowel gaseous distention.  There is gas and stool in the rectum. Densities at the right lung base could represent atelectasis and cannot exclude pleural fluid.  IMPRESSION: There is oral contrast and stool within the colon.  No evidence for bowel obstruction.  Feeding tube in the jejunum.   Original Report Authenticated By: Richarda Overlie, M.D.    Dg Abd Acute W/chest  05/30/2012  *RADIOLOGY REPORT*  Clinical Data: Abdominal pain and vomiting.  ACUTE ABDOMEN SERIES (ABDOMEN 2 VIEW & CHEST 1 VIEW)  Comparison: 12/05/2008  Findings: Lungs are clear. Heart and mediastinum are within normal limits.  Trachea is midline.  No evidence of free air.  Nonspecific bowel gas pattern.  Surgical clips in the right upper abdomen. Degenerative changes in the lower lumbar spine.  Phleboliths  in the pelvis.  IMPRESSION: No acute findings.   Original Report Authenticated By: Richarda Overlie, M.D.    Dg Naso G Tube Plc W/fl-no Rad  06/04/2012  CLINICAL DATA:    NASO G TUBE PLACEMENT WITH FLUORO  Fluoroscopy was utilized by the requesting physician.  No radiographic  interpretation.     Mr Abd W/wo Cm/mrcp  06/02/2012  *RADIOLOGY REPORT*  Clinical Data:  Pancreatitis and biliary dilatation.  Elevated liver function tests.  MRI ABDOMEN WITHOUT AND WITH CONTRAST (INCLUDING MRCP)  Technique:  Multiplanar multisequence MR imaging of the abdomen was performed both before and after the administration of intravenous contrast. Heavily T2-weighted images of the biliary and pancreatic ducts were obtained, and three-dimensional MRCP images were rendered by post processing.  Contrast: 20 ml Multihance  Comparison:  05/31/2012  Findings:  Despite efforts by the patient and  technologist, motion artifact is present on some series of today's examination and could not be totally eliminated.  This reduces diagnostic sensitivity and specificity.  Trace bilateral pleural effusions noted with mesenteric edema and mild perihepatic and perisplenic ascites.  Edema tracks within and around the pancreas, compatible with pancreatitis.  Gallbladder absent.  The common bile duct measures a maximum of 6 mm on today's examination.  On image 15 of series 3, the portion of the common bile duct adjacent to the pancreatic head is somewhat irregular, and there is an appearance of abrupt truncation of the CBD adjacent to the ampulla on that same image. This abrupt truncation is shown on the T2-weighted images, but on the postcontrast images such as images 66-77 of series 1504, there appears to be a focal stricturing or extrinsic narrowing of the CBD but with a small channel communicating with the ampulla  Abnormal high precontrast T1 signal in the pancreatic body is shown on images 48-68 of series 1500, compatible with hemorrhage. This corresponds to a lack of enhancement in the pancreatic body highly concerning for pancreatic necrosis, as noted on the prior CT scan.  IMPRESSION: 1.  The common bile duct currently only measures 6 mm in diameter, but there is focal irregular narrowing in the CBD along the pancreatic head, and also focal narrowing in the distal CBD in the vicinity of the ampulla. Loss of the normal conical distal CBD tapering.  T2-weighted images suggest an abrupt truncation of the distal-most CBD as can be seen with obstructing stone, but the postcontrast images seem to demonstrate a small channel connecting through to the ampulla raising the possibility that the distal CBD stenosis may be from stricturing or extrinsic compression related to the underlying pancreatitis.  Differentiation is not helped by the degree of motion artifact. 2.  Pancreatic necrosis of the pancreatic body, with  associated hemorrhage in this portion of the pancreas.  3.  Trace bilateral pleural effusions with adjacent atelectasis; mesenteric edema along with perihepatic and perisplenic ascites. Peripancreatic edema pattern with acute pancreatitis.   Original Report Authenticated By: Gaylyn Rong, M.D.    US Abdomen Limited Ruq  05/31/2012  *RADIOLOGY REPORT*  Clinical Data: Pancreatitis.  Elevated LFTs.  LIMITED ABDOMINAL ULTRASOUND  Comparison:  05/31/2012 CT.  Findings:  Gallbladder:  Post cholecystectomy.  Common bile duct:  Only the proximal aspect is well delineated secondary to bowel gas with the common bile duct measuring up to 9.3 mm.  Mid to distal common bile duct stone cannot be excluded based on present exam.  Liver:  Dilated intrahepatic biliary ducts.  Free fluid noted.  Limit evaluation of  the pancreas.  Please see recent CT report.  IMPRESSION: Post cholecystectomy.  Dilated proximal common bile duct measuring up to 9.3 mm.   Mid to distal common bile duct not visualized secondary to bowel gas, mid to distal common bile duct stone can not be excluded.  Dilated intrahepatic biliary ducts.   Original Report Authenticated By: Lacy Duverney, M.D.      Labs: Basic Metabolic Panel:  Recent Labs Lab 06/09/12 0554 06/11/12 9562 06/12/12 0445 06/13/12 0619 06/14/12 0635  NA 135 138 138 137 140  K 4.2 4.3 3.6 3.8 3.8  CL 98 100 101 103 105  CO2 25 30 28 25 25   GLUCOSE 111* 128* 136* 117* 92  BUN 11 14 17 15 14   CREATININE 0.64 0.95 0.83 0.82 0.80  CALCIUM 8.8 9.4 8.6 8.5 8.9  MG 2.2  --   --   --   --   PHOS 3.1  --   --   --   --    Liver Function Tests:  Recent Labs Lab 06/09/12 0554 06/12/12 0445  AST 12 8  ALT 28 19  ALKPHOS 161* 141*  BILITOT 0.4 0.3  PROT 6.9 6.7  ALBUMIN 2.7* 2.7*   CBC:  Recent Labs Lab 06/08/12 0600 06/09/12 0554 06/11/12 0650 06/12/12 0445 06/14/12 0635  WBC 11.2* 12.9* 14.6* 14.2* 9.6  HGB 12.0* 12.2* 14.2 12.8* 12.5*  HCT 34.2* 37.8*  41.0 37.0* 36.7*  MCV 82.2 85.3 83.3 83.1 83.2  PLT 277 302 333 328 338   CBG:  Recent Labs Lab 06/12/12 1206 06/14/12 0740 06/14/12 1216  GLUCAP 132* 91 96       Signed:  Conley Canal [phone] [facility] 06/14/2012, 2:17 PM

## 2012-06-14 NOTE — Progress Notes (Signed)
PATIENT DETAILS Name: Justin [name] Age: 57 y.o. Sex: male Date of Birth: 1955-08-17 Admit Date: 05/30/2012 Admitting Physician Vania Rea, MD PCP:No primary provider on file.  Subjective: Unable to tolerate clears.  No complaints, no BM x 3 days.  Complaining of pain if pain med doesn't come q 6 hours.  Assessment/Plan: Active Problems:  Severe necrotizing pancreatitis with phlegmon. Etiology unclear-current plans are for continued conservative management-and EUS when much more stable.  F/U CT scan 4/7. LFTs have normalized. GI and CCS following Post pyloric panda in place.  Tube feeds at goal.  Patient does not want clears (sips caused pain on 4/2) Per GI patient to stay in a managed medical care setting due to likelihood of quick on-set SIRS.   Inpatient vs SNF. Change Fentanyl to Patch, change PO pain med to q4 PRN.    Abdominal Distention Beginning to re-occur.  (Earlier inducing bowel movements helped). Last stool was 4/1 Added IVF with Potassium at 75 ml/hour Miralax added back. Ambulate patient at least TID and up to chair.  No carbonated beverages.  Systemic inflammation response syndrome This has resolved   Hypokalemia Resolved Monitor electrolytes closely IV with 20 meq  Hypertension On metoprolol orally.  BP stable  Low Back Pain Patient has known spinal pathology Will resume voltaren gel.  Will also trial Capsaicin cream bid.  Dyslipidemia Holding statins for now- resume when able or discharge  History of coronary artery disease status post CABG Currently stable Resume aspirin today.- did have pancreatic hemorrhage seen on MRCP   Hypothyroidism Continue with Synthroid IV  Fibromyalgia  Well compensated at this time  Depression On Cymbalta  Disposition: Potential D/C to SNF on Panda feeds.  Will need CT scan on Monday with continued follow up by GI.  DVT Prophylaxis: Prophylactic Lovenox  Code Status: Full code    Procedures:  None  CONSULTS:  GI and general surgery  PHYSICAL EXAM: Vital signs in last 24 hours: Filed Vitals:   06/13/12 2046 06/13/12 2359 06/14/12 0413 06/14/12 0955  BP: 134/90 107/63 120/85 130/81  Pulse: 95 71 84 91  Temp: 97.1 F (36.2 C) 98.4 F (36.9 C) 97.5 F (36.4 C)   TempSrc: Oral Oral Oral   Resp: 18 16 16    Height:      Weight:   75.5 kg (166 lb 7.2 oz)   SpO2: 96% 96% 96%     Weight change: -0.3 kg (-10.6 oz) Body mass index is 26.47 kg/(m^2).   Gen Exam: Awake and alert with clear speech.  N/G in place. Patient very pleasant. Hair net on  Neck: Supple, No JVD.  No thyromegaly Chest: CTA no w/c/r  CVS: S1 S2 Regular, no murmurs.  Abdomen: becoming firmer again, distended, tender to palpation in the epigastrum, + bowel sounds  Extremities: no edema, lower extremities warm to touch. Neurologic: Non Focal.   Skin: No Rash, bruises or lesions   Intake/Output from previous day:  Intake/Output Summary (Last 24 hours) at 06/14/12 1008 Last data filed at 06/14/12 1003  Gross per 24 hour  Intake      3 ml  Output   1000 ml  Net   -997 ml     LAB RESULTS: CBC  Recent Labs Lab 06/08/12 0600 06/09/12 0554 06/11/12 0650 06/12/12 0445 06/14/12 0635  WBC 11.2* 12.9* 14.6* 14.2* 9.6  HGB 12.0* 12.2* 14.2 12.8* 12.5*  HCT 34.2* 37.8* 41.0 37.0* 36.7*  PLT 277 302 333 328 338  MCV 82.2 85.3  83.3 83.1 83.2  MCH 28.8 27.5 28.9 28.8 28.3  MCHC 35.1 32.3 34.6 34.6 34.1  RDW 15.6* 15.4 15.3 14.8 14.9    Chemistries   Recent Labs Lab 06/09/12 0554 06/11/12 0650 06/12/12 0445 06/13/12 0619 06/14/12 0635  NA 135 138 138 137 140  K 4.2 4.3 3.6 3.8 3.8  CL 98 100 101 103 105  CO2 25 30 28 25 25   GLUCOSE 111* 128* 136* 117* 92  BUN 11 14 17 15 14   CREATININE 0.64 0.95 0.83 0.82 0.80  CALCIUM 8.8 9.4 8.6 8.5 8.9  MG 2.2  --   --   --   --     RADIOLOGY STUDIES/RESULTS: Dg Abd 1 View  06/04/2012  *RADIOLOGY REPORT*  Clinical Data:  Feeding tube placement  ABDOMEN - 1 VIEW  Comparison: None.  Fluoroscopy time: 18 seconds  Findings: Weighted feeding tube has been placed in the proximal duodenum just past the ligament of Treitz.  IMPRESSION: Weighted feeding tube terminates in the proximal duodenum.   Original Report Authenticated By: Charline Bills, M.D.    Ct Abdomen Pelvis W Contrast  05/31/2012  *RADIOLOGY REPORT*  Clinical Data: Acute pancreatitis, mid abdominal pain  CT ABDOMEN AND PELVIS WITH CONTRAST  Technique:  Multidetector CT imaging of the abdomen and pelvis was performed following the standard protocol during bolus administration of intravenous contrast.  Contrast: 50mL OMNIPAQUE IOHEXOL 300 MG/ML  SOLN, 1 OMNIPAQUE IOHEXOL 300 MG/ML  SOLN, OMNIPAQUE IOHEXOL 300 MG/ML  SOLN  Comparison: Chest and abdomen films of 05/30/2012  Findings: There is bibasilar linear atelectasis or scarring present.  No effusion is seen. There does appear to be a small hiatal hernia present.  The liver enhances and there is prominence of the central intrahepatic ducts.  Surgical clips are present from prior cholecystectomy.  However there is marked edema of the neck and proximal body of the pancreas with considerable peripancreatic exudate consistent with acute pancreatitis.  The common bile duct is somewhat prominent measuring 9 mm in diameter but no definite calculus is seen. Stricture or mass cannot be excluded although no obvious mass is evident.  The fact that the head of the pancreatic parenchyma enhances as does the distal body and tail, but there is no enhancement of the distal head and proximal body is worrisome for pancreatic necrosis.  There is edema of the adjacent descending duodenum and distal stomach.  No discrete abscess or pseudocyst is evident.  The adrenal glands and spleen are unremarkable.  The stomach is moderately distended with contrast with no gross abnormality other than the previously noted edema.  The kidneys enhance  with several small nonobstructing renal calculi noted bilaterally.  The abdominal aorta is normal in caliber.  No adenopathy is seen.  Pancreatic exudate extends into the right retroperitoneum.  There is also a moderate amount of free fluid noted layering within the pelvis.  The urinary bladder is unremarkable.  The prostate is normal in size.  Scattered rectosigmoid colonic diverticula are seen.  No abnormality of the terminal ileum or appendix is noted. Degenerative change is noted involving the facet joints of L4-5 and L5-S1.  IMPRESSION:  1.  Acute pancreatitis with edema of the distal head and proximal body the pancreas and considerable surrounding exudate extending into the pelvis.  The lack of enhancement of the distal head and proximal body the pancreas is worrisome for pancreatic necrosis. No abscess or pseudocyst is evident currently. 2.  Prominent common bile duct and intrahepatic  ducts may be due to edema but a non-visualized distal calculus, stricture or mass cannot be excluded as the etiology. 3.  Moderate amount of free fluid in the pelvis. 4.  Small hiatal hernia. 5.  Small nonobstructing bilateral renal calculi.   Original Report Authenticated By: Dwyane Dee, M.D.    Mr 3d Recon At Scanner  06/02/2012  *RADIOLOGY REPORT*  Clinical Data:  Pancreatitis and biliary dilatation.  Elevated liver function tests.  MRI ABDOMEN WITHOUT AND WITH CONTRAST (INCLUDING MRCP)  Technique:  Multiplanar multisequence MR imaging of the abdomen was performed both before and after the administration of intravenous contrast. Heavily T2-weighted images of the biliary and pancreatic ducts were obtained, and three-dimensional MRCP images were rendered by post processing.  Contrast: 20 ml Multihance  Comparison:  05/31/2012  Findings:  Despite efforts by the patient and technologist, motion artifact is present on some series of today's examination and could not be totally eliminated.  This reduces diagnostic sensitivity  and specificity.  Trace bilateral pleural effusions noted with mesenteric edema and mild perihepatic and perisplenic ascites.  Edema tracks within and around the pancreas, compatible with pancreatitis.  Gallbladder absent.  The common bile duct measures a maximum of 6 mm on today's examination.  On image 15 of series 3, the portion of the common bile duct adjacent to the pancreatic head is somewhat irregular, and there is an appearance of abrupt truncation of the CBD adjacent to the ampulla on that same image. This abrupt truncation is shown on the T2-weighted images, but on the postcontrast images such as images 66-77 of series 1504, there appears to be a focal stricturing or extrinsic narrowing of the CBD but with a small channel communicating with the ampulla  Abnormal high precontrast T1 signal in the pancreatic body is shown on images 48-68 of series 1500, compatible with hemorrhage. This corresponds to a lack of enhancement in the pancreatic body highly concerning for pancreatic necrosis, as noted on the prior CT scan.  IMPRESSION: 1.  The common bile duct currently only measures 6 mm in diameter, but there is focal irregular narrowing in the CBD along the pancreatic head, and also focal narrowing in the distal CBD in the vicinity of the ampulla. Loss of the normal conical distal CBD tapering.  T2-weighted images suggest an abrupt truncation of the distal-most CBD as can be seen with obstructing stone, but the postcontrast images seem to demonstrate a small channel connecting through to the ampulla raising the possibility that the distal CBD stenosis may be from stricturing or extrinsic compression related to the underlying pancreatitis.  Differentiation is not helped by the degree of motion artifact. 2.  Pancreatic necrosis of the pancreatic body, with associated hemorrhage in this portion of the pancreas.  3.  Trace bilateral pleural effusions with adjacent atelectasis; mesenteric edema along with  perihepatic and perisplenic ascites. Peripancreatic edema pattern with acute pancreatitis.   Original Report Authenticated By: Gaylyn Rong, M.D.    Dg Abd Acute W/chest  05/30/2012  *RADIOLOGY REPORT*  Clinical Data: Abdominal pain and vomiting.  ACUTE ABDOMEN SERIES (ABDOMEN 2 VIEW & CHEST 1 VIEW)  Comparison: 12/05/2008  Findings: Lungs are clear. Heart and mediastinum are within normal limits.  Trachea is midline.  No evidence of free air.  Nonspecific bowel gas pattern.  Surgical clips in the right upper abdomen. Degenerative changes in the lower lumbar spine.  Phleboliths in the pelvis.  IMPRESSION: No acute findings.   Original Report Authenticated By: Richarda Overlie, M.D.  Dg Naso G Tube Plc W/fl-no Rad  06/04/2012  CLINICAL DATA:    NASO G TUBE PLACEMENT WITH FLUORO  Fluoroscopy was utilized by the requesting physician.  No radiographic  interpretation.     Mr Abd W/wo Cm/mrcp  06/02/2012  *RADIOLOGY REPORT*  Clinical Data:  Pancreatitis and biliary dilatation.  Elevated liver function tests.  MRI ABDOMEN WITHOUT AND WITH CONTRAST (INCLUDING MRCP)  Technique:  Multiplanar multisequence MR imaging of the abdomen was performed both before and after the administration of intravenous contrast. Heavily T2-weighted images of the biliary and pancreatic ducts were obtained, and three-dimensional MRCP images were rendered by post processing.  Contrast: 20 ml Multihance  Comparison:  05/31/2012  Findings:  Despite efforts by the patient and technologist, motion artifact is present on some series of today's examination and could not be totally eliminated.  This reduces diagnostic sensitivity and specificity.  Trace bilateral pleural effusions noted with mesenteric edema and mild perihepatic and perisplenic ascites.  Edema tracks within and around the pancreas, compatible with pancreatitis.  Gallbladder absent.  The common bile duct measures a maximum of 6 mm on today's examination.  On image 15 of series 3,  the portion of the common bile duct adjacent to the pancreatic head is somewhat irregular, and there is an appearance of abrupt truncation of the CBD adjacent to the ampulla on that same image. This abrupt truncation is shown on the T2-weighted images, but on the postcontrast images such as images 66-77 of series 1504, there appears to be a focal stricturing or extrinsic narrowing of the CBD but with a small channel communicating with the ampulla  Abnormal high precontrast T1 signal in the pancreatic body is shown on images 48-68 of series 1500, compatible with hemorrhage. This corresponds to a lack of enhancement in the pancreatic body highly concerning for pancreatic necrosis, as noted on the prior CT scan.  IMPRESSION: 1.  The common bile duct currently only measures 6 mm in diameter, but there is focal irregular narrowing in the CBD along the pancreatic head, and also focal narrowing in the distal CBD in the vicinity of the ampulla. Loss of the normal conical distal CBD tapering.  T2-weighted images suggest an abrupt truncation of the distal-most CBD as can be seen with obstructing stone, but the postcontrast images seem to demonstrate a small channel connecting through to the ampulla raising the possibility that the distal CBD stenosis may be from stricturing or extrinsic compression related to the underlying pancreatitis.  Differentiation is not helped by the degree of motion artifact. 2.  Pancreatic necrosis of the pancreatic body, with associated hemorrhage in this portion of the pancreas.  3.  Trace bilateral pleural effusions with adjacent atelectasis; mesenteric edema along with perihepatic and perisplenic ascites. Peripancreatic edema pattern with acute pancreatitis.   Original Report Authenticated By: Gaylyn Rong, M.D.    US Abdomen Limited Ruq  05/31/2012  *RADIOLOGY REPORT*  Clinical Data: Pancreatitis.  Elevated LFTs.  LIMITED ABDOMINAL ULTRASOUND  Comparison:  05/31/2012 CT.  Findings:   Gallbladder:  Post cholecystectomy.  Common bile duct:  Only the proximal aspect is well delineated secondary to bowel gas with the common bile duct measuring up to 9.3 mm.  Mid to distal common bile duct stone cannot be excluded based on present exam.  Liver:  Dilated intrahepatic biliary ducts.  Free fluid noted.  Limit evaluation of the pancreas.  Please see recent CT report.  IMPRESSION: Post cholecystectomy.  Dilated proximal common bile duct measuring up  to 9.3 mm.   Mid to distal common bile duct not visualized secondary to bowel gas, mid to distal common bile duct stone can not be excluded.  Dilated intrahepatic biliary ducts.   Original Report Authenticated By: Lacy Duverney, M.D.     MEDICATIONS: Scheduled Meds: . antiseptic oral rinse  15 mL Mouth Rinse q12n4p  . aspirin  81 mg Oral Daily  . chlorhexidine  15 mL Mouth Rinse BID  . diclofenac sodium  2 g Topical QID  . DULoxetine  60 mg Oral Daily  . enoxaparin (LOVENOX) injection  40 mg Subcutaneous Q24H  . fentaNYL  50 mcg Transdermal Q72H  . levothyroxine  50 mcg Intravenous QAC breakfast  . lip balm   Topical BID  . lipase/protease/amylase  2 capsule Oral TID AC  . metoprolol tartrate  25 mg Oral BID  . pantoprazole  40 mg Oral Q0600  . polyethylene glycol  17 g Oral Daily  . sodium chloride  3 mL Intravenous Q12H   Continuous Infusions: . feeding supplement (VITAL AF 1.2 CAL) 1,000 mL (06/12/12 1605)  . sodium chloride 0.9 % 1,000 mL with potassium chloride 20 mEq infusion 75 mL/hr at 06/14/12 0319   PRN Meds:.acetaminophen, bisacodyl, diphenhydrAMINE, diphenhydrAMINE, hydrALAZINE, magic mouthwash, menthol-cetylpyridinium, nitroGLYCERIN, ondansetron (ZOFRAN) IV, oxyCODONE, phenol  Antibiotics: Anti-infectives   Start     Dose/Rate Route Frequency Ordered Stop   05/31/12 1400  ciprofloxacin (CIPRO) IVPB 400 mg  Status:  Discontinued     400 mg 200 mL/hr over 60 Minutes Intravenous Every 12 hours 05/31/12 1323 06/10/12 1449    05/31/12 1400  metroNIDAZOLE (FLAGYL) IVPB 500 mg  Status:  Discontinued     500 mg 100 mL/hr over 60 Minutes Intravenous Every 8 hours 05/31/12 1323 06/10/12 [address] [phone] [facility]  If 7PM-7AM, please contact night-coverage [email or web address] Password [email or web address] 06/14/2012, 10:08 AM   LOS: 15 days

## 2012-06-14 NOTE — Progress Notes (Signed)
Stable. Will f/u Monday - call w questions over weekend - no change in recs  Justin [name], M.D., F.A.C.S. Gastrointestinal and Minimally Invasive Surgery [facility] [address], [address] [place], [place] [address] [phone] Main / Paging

## 2012-06-14 NOTE — Progress Notes (Signed)
Subjective: The patient states he is doing much better today.  His pain has improved with more scheduled dosing.  Denies chest pains, shortness of breath.  Tolerating tube feeds well.  He is ambulating, +bm and flatus, urinating without any difficulties.  No success on placement yet.  Wife at bedside.  Objective: Vital signs in last 24 hours: Temp:  [97.1 F (36.2 C)-98.4 F (36.9 C)] 97.5 F (36.4 C) (04/04 0413) Pulse Rate:  [71-95] 91 (04/04 0955) Resp:  [16-20] 16 (04/04 0413) BP: (107-134)/(63-90) 130/81 mmHg (04/04 0955) SpO2:  [96 %] 96 % (04/04 0413) Weight:  [166 lb 7.2 oz (75.5 kg)] 166 lb 7.2 oz (75.5 kg) (04/04 0413) Last BM Date: 06/11/12  Intake/Output from previous day: 04/03 0701 - 04/04 0700 In: -  Out: 1875 [Urine:1875] Intake/Output this shift: Total I/O In: 3 [I.V.:3] Out: -   General appearance: alert, cooperative and no distress Resp: clear to auscultation bilaterally Chest wall: no tenderness Cardio: regular rate and rhythm, S1, S2 normal, no murmur, click, rub or gallop GI: mild TTP generalized abdomen, no point tenderness.  Bowel sounds active. Extremities: extremities normal, atraumatic, no cyanosis or edema Skin: Skin color, texture, turgor normal. No rashes or lesions Neurologic: Grossly normal  Lab Results:   Recent Labs  06/12/12 0445 06/14/12 0635  WBC 14.2* 9.6  HGB 12.8* 12.5*  HCT 37.0* 36.7*  PLT 328 338   BMET  Recent Labs  06/13/12 0619 06/14/12 0635  NA 137 140  K 3.8 3.8  CL 103 105  CO2 25 25  GLUCOSE 117* 92  BUN 15 14  CREATININE 0.82 0.80  CALCIUM 8.5 8.9   PT/INR No results found for this basename: LABPROT, INR,  in the last 72 hours ABG No results found for this basename: PHART, PCO2, PO2, HCO3,  in the last 72 hours  Studies/Results: Dg Abd 1 View  06/14/2012  *RADIOLOGY REPORT*  Clinical Data: Evaluate feeding tube position  ABDOMEN - 1 VIEW  Comparison: Abdomen film of 06/13/2012  Findings: The tip  of the feeding tube is located just beyond the ligament of Treitz, documented by injection of a small amount of contrast.  The feeding tube was placed under fluoroscopy by radiologic technologist Tara Dingus.  IMPRESSION: Tip of feeding tube in good position just beyond the ligament of Treitz.   Original Report Authenticated By: Dwyane Dee, M.D.    Dg Abd 1 View  06/13/2012  *RADIOLOGY REPORT*  Clinical Data: Evaluate placement of feeding tube.  ABDOMEN - 1 VIEW  Comparison: 06/09/2012.  Findings: Metallic tip of a feeding tube appears coiled upon itself, likely in the region of the duodenal bulb.  The tube is likely to be transpyloric, with a portion of the tube extending into the second portion of the duodenum.  Proximally, the tube does appear coiled several times within the stomach.  Visualized bowel gas pattern is nonobstructive. Surgical clips project over the right upper quadrant of the abdomen, compatible with prior cholecystectomy.  IMPRESSION: The tip of the feeding tube appears to be in the region of the duodenal bulb, as above.   Original Report Authenticated By: Trudie Reed, M.D.     Anti-infectives: Anti-infectives   Start     Dose/Rate Route Frequency Ordered Stop   05/31/12 1400  ciprofloxacin (CIPRO) IVPB 400 mg  Status:  Discontinued     400 mg 200 mL/hr over 60 Minutes Intravenous Every 12 hours 05/31/12 1323 06/10/12 1449   05/31/12 1400  metroNIDAZOLE (  FLAGYL) IVPB 500 mg  Status:  Discontinued     500 mg 100 mL/hr over 60 Minutes Intravenous Every 8 hours 05/31/12 1323 06/10/12 1449      Assessment/Plan: 1. Necrotizing Pancreatitis with pseudocysts evolution:  1. If worsening: NPO, TNA, start Primaxin (only Abx to be of utility in necrotizing pancreatitis).  2. Could reconsider CT in another 5 weeks  3. HOLD OFF ON PERC DRAINAGE AS THIS WILL ACTUALLY HINDER HEALING. Most likely would be pseudocysts that will heal on their own vs internal drainage if persists after 6  weeks of Dx when the cyst wall is well formed with lap assisted transgastric pseudocyst-gastrostomy which Dr. Michaell Cowing would be happy to do at that point.  4. GI not recommending ERCP at this time. Would consider EUS +/- ERCP to further evaluate in a few weeks depending on clinical course.  5. Would hold of on advancing diet for a while until pain is much better controlled. He has had multiple trials of clears and every time he seems to have worsening symptoms. Tolerating TF at goal rate. Would continue these at SNF when discharged to keep his nutritional status up to allow for healing.    LOS: 15 days    Justin [name], [facility] 06/14/2012

## 2012-06-14 NOTE — Clinical Social Work Psychosocial (Signed)
Clinical Social Work Department BRIEF PSYCHOSOCIAL ASSESSMENT 06/14/2012  Patient:  Justin [name], Justin [name]     Account Number:  [id number]     Admit date:  05/30/2012  Clinical Social Worker:  Delmer Islam  Date/Time:  06/14/2012 05:10 AM  Referred by:  Physician  Date Referred:  06/13/2012 Referred for  SNF Placement   Other Referral:   Interview type:  Patient Other interview type:   CSW talked with patient and girlfriend Ermalinda Memos [phone]) together.    PSYCHOSOCIAL DATA Living Status:  FAMILY Admitted from facility:   Level of care:   Primary support name:  Cecelia Pruitt Primary support relationship to patient:  PARTNER Degree of support available:   Ms. Cresenciano Genre is very devoted to patient and his well-being    CURRENT CONCERNS Current Concerns  Post-Acute Placement   Other Concerns:    SOCIAL WORK ASSESSMENT / PLAN On 06/13/12 CSW talked with patient and girlfriend Cecelia regarding discharge planning. Pt advised that SNF recommended or home with 24 hr care/supervision. Pt expressed that he really wanted to go home, but would defer to what his girlfriend decided as he did not know anything about facilities and rehab. Short-term rehab at a SNF was discussed and Ms. Pruitt assured patient that she would care for him if his decision was home.    As we were talking, attending came into the room and joined the conversation. Patient was advised that because of his panda, the GI MD recommended SNF so that he could be followed appropriately. MD also explained verbally and with a drawing what was going on with patient's pancreas. Patient then agreed to SNF and the preferences are [facility] and [facility]. Ms. Cresenciano Genre reported that she knows people at [facility] and will call them regarding patient coming to facility.    Patient and girlfriend's other concern was that patient has a court date in mid-April in [place]  concerning his workman's compensation. MD explained that he can  possibly make court date, but if he   Assessment/plan status:  Psychosocial Support/Ongoing Assessment of Needs Other assessment/ plan:   Information/referral to community resources:   Patient/girlfriend given SNF list for [facility]    PATIENT'S/FAMILY'S RESPONSE TO PLAN OF CARE: Patient and girlfriend very open and engaged with CSW. Patient initially hesitant, but after MD joined that conversation, he more clearly understood the importance of going to rehab. and was willing to go.

## 2012-06-14 NOTE — Progress Notes (Signed)
     [facility] Daily Rounding Note 06/14/2012, 9:15 AM  SUBJECTIVE:       Pain is better overall, but oral narcotics do not last long enough.  NGT replaced coiled in stomach though the tip was in the duodenal bulb.  Did not have nausea or increased pain as a result. No BM for 3 or more days.   OBJECTIVE:         Vital signs in last 24 hours:    Temp:  [97.1 F (36.2 C)-99 F (37.2 C)] 97.5 F (36.4 C) (04/04 0413) Pulse Rate:  [71-95] 84 (04/04 0413) Resp:  [16-20] 16 (04/04 0413) BP: (106-134)/(63-90) 120/85 mmHg (04/04 0413) SpO2:  [96 %-98 %] 96 % (04/04 0413) Weight:  [75.5 kg (166 lb 7.2 oz)] 75.5 kg (166 lb 7.2 oz) (04/04 0413) Last BM Date: 06/11/12 General: looks the same, a bit diaphoretic, comfortable.  Not toxic   Heart: RRR, no tachycardia Chest: clear B.  No labored respiration Abdomen: soft, tender in upper abdomen as well as mid abdomen  Extremities: no CCE Neuro/Psych:  Pleasant, oriented x 3.  No gross tremor of deficits.   Intake/Output from previous day: 04/03 0701 - 04/04 0700 In: -  Out: 1875 [Urine:1875]  Intake/Output this shift:    Lab Results:  Recent Labs  06/12/12 0445 06/14/12 0635  WBC 14.2* 9.6  HGB 12.8* 12.5*  HCT 37.0* 36.7*  PLT 328 338   BMET  Recent Labs  06/12/12 0445 06/13/12 0619 06/14/12 0635  NA 138 137 140  K 3.6 3.8 3.8  CL 101 103 105  CO2 28 25 25   GLUCOSE 136* 117* 92  BUN 17 15 14   CREATININE 0.83 0.82 0.80  CALCIUM 8.6 8.5 8.9   LFT  Recent Labs  06/12/12 0445  PROT 6.7  ALBUMIN 2.7*  AST 8  ALT 19  ALKPHOS 141*  BILITOT 0.3    Studies/Results: Dg Abd 1 View 06/13/2012   IMPRESSION: The tip of the feeding tube appears to be in the region of the duodenal bulb, as above.   Original Report Authenticated By: Trudie Reed, M.D.     ASSESMENT: * Acute pancreatitis, recurrent. 3/31 follow up CT showing pancreatic body necrosis, phlegmon, mass effect on SMV and splenic veins. Likely evolving  pseudocyst.  Pain exacerbated by clear liquid tray 4/1and sips of clears 4/2. No fevers, normal WBCs.  Non obstructed KUB  * Hyperglycemia.    PLAN: *  Continue tube feeds.   *  Team attempting to find SNF that will accept a NGT.  *  Daily MIralax.    LOS: 15 days   Jennye Moccasin  06/14/2012, 9:15 AM Pager: [phone]    I have taken an interval history, reviewed the chart and examined the patient. I agree with the Advanced Practitioner's note, impression and recommendations. Continue TFs. Monitor closely for complications. Repeat CT next week to reassess pancreatic necrosis, phlegmon, developing pseudocyst. GI will see again on Monday. Please call for questions over the weekend.   Venita Lick. Russella Dar MD Clementeen Graham

## 2012-06-19 ENCOUNTER — Encounter: Payer: Self-pay | Admitting: Physician Assistant

## 2012-06-19 ENCOUNTER — Ambulatory Visit (INDEPENDENT_AMBULATORY_CARE_PROVIDER_SITE_OTHER): Payer: Medicare Other | Admitting: Physician Assistant

## 2012-06-19 ENCOUNTER — Other Ambulatory Visit (INDEPENDENT_AMBULATORY_CARE_PROVIDER_SITE_OTHER): Payer: Medicare Other

## 2012-06-19 VITALS — BP 110/70 | HR 64 | Ht 66.0 in | Wt 164.4 lb

## 2012-06-19 DIAGNOSIS — E440 Moderate protein-calorie malnutrition: Secondary | ICD-10-CM

## 2012-06-19 DIAGNOSIS — K859 Acute pancreatitis without necrosis or infection, unspecified: Secondary | ICD-10-CM

## 2012-06-19 DIAGNOSIS — K8591 Acute pancreatitis with uninfected necrosis, unspecified: Secondary | ICD-10-CM

## 2012-06-19 LAB — CBC WITH DIFFERENTIAL/PLATELET
Basophils Absolute: 0 10*3/uL (ref 0.0–0.1)
Lymphocytes Relative: 21.2 % (ref 12.0–46.0)
Monocytes Relative: 6.8 % (ref 3.0–12.0)
Platelets: 405 10*3/uL — ABNORMAL HIGH (ref 150.0–400.0)
RDW: 14.6 % (ref 11.5–14.6)
WBC: 7.5 10*3/uL (ref 4.5–10.5)

## 2012-06-19 LAB — COMPREHENSIVE METABOLIC PANEL
ALT: 32 U/L (ref 0–53)
Albumin: 3.7 g/dL (ref 3.5–5.2)
CO2: 32 mEq/L (ref 19–32)
Calcium: 9 mg/dL (ref 8.4–10.5)
Chloride: 100 mEq/L (ref 96–112)
GFR: 98.68 mL/min (ref >60.00)
Potassium: 3.9 mEq/L (ref 3.5–5.1)
Sodium: 139 mEq/L (ref 135–145)
Total Bilirubin: 0.8 mg/dL (ref 0.3–1.2)
Total Protein: 8 g/dL (ref 6.0–8.3)

## 2012-06-19 NOTE — Patient Instructions (Addendum)
We made you a follow up appointment with Amy Esterwood PA-C for Tuesday 07-25-2012 at 2:00 PM.   Clear liquid diet today. If tolerating well, go to a full liquid diet on Monday 4-14. Continue tube feedings for now, 24 hours per day. You may convert to night time feedings to 12 hours per day once discharged from the [facility].

## 2012-06-19 NOTE — Progress Notes (Addendum)
Subjective:    Patient ID: Justin [name], male    DOB: 20-Dec-1955, 57 y.o.   MRN: [id number]  HPI Justin [name] is a pleasant 57 year old male who was initially seen by GI during his recent prolonged hospitalization for acute necrotizing pancreatitis. He was hospitalized 05/30/2012- 06/14/2012. He had acute severe pancreatitis with SIRS and documented pancreatic necrosis. Initially he was jaundiced but liver enzymes improved with time and it was felt that he had extrinsic compression of his bile duct secondary to pancreatic edema. At the time of discharge she was unable to tolerate by mouth's, and was discharged to [facility] for rehabilitation and continued nasal tube feedings. His last CT of the abdomen and pelvis was done on 06/10/2012 and showed decrease in intrahepatic ductal dilation, he had a 5 x 5.7 cm low density collection in the retroperitoneum at the level of the pancreatic body as well as evidence for pancreatic body necrosis. He also has an evolving pseudocyst at the pancreatic tail measuring 1.9 x 5.1 cm. He was discharged with a Duragesic patch and oxycodone when necessary. Etiology of his pancreatitis is unclear. He is status post cholecystectomy done 4-5 years ago in [place]. Patient is and his wife state that he has never been diagnosed with pancreatitis prior to this episode had been having intermittent bouts of upper abdominal pain and nausea over the past 4-5 years. These episodes of pain would last for several days and gradually improve. He had his gallbladder removed because of these episodes of pain but says symptoms did not change postoperatively. He is not sure whether he had gallstones at the time of his cholecystectomy. Patient also has history of chronic EtOH use but has not been drinking any alcohol over the past 5 years up until the past 3-4 months when he was told by primary care to use Brandy in the evenings for control of a cough. He says he was sipping on Brandy every  night for a couple of months prior to this episode of pancreatitis. Since discharge from the hospital the patient states he is feeling significantly better and that his pain level is down to a "2/10". He is still using oral pain medication but much less frequently. He also states that he has an appetite and started on some clear liquids yesterday and did not have any top difficulty tolerating. He has not had any documented fevers but has been having some sweats at night. He has not had any diarrhea patient states that he feels he is ready to be discharged from the nursing facility and he and his wife both feel that they can manage at home. He is up walking with a cane and is ambulating without any difficulty.    Review of Systems  Constitutional: Positive for appetite change.  HENT: Negative.   Eyes: Negative.   Respiratory: Negative.   Cardiovascular: Negative.   Gastrointestinal: Positive for abdominal pain.  Genitourinary: Negative.   Allergic/Immunologic: Negative.   Neurological: Negative.   Hematological: Negative.   Psychiatric/Behavioral: Negative.    Outpatient Prescriptions Prior to Visit  Medication Sig Dispense Refill  . aspirin 81 MG chewable tablet Chew 1 tablet (81 mg total) by mouth daily.      . bisacodyl (DULCOLAX) 10 MG suppository Place 1 suppository (10 mg total) rectally daily as needed.  12 suppository  1  . capsicum (ZOSTRIX) 0.075 % topical cream Apply topically 2 (two) times daily.  28.3 g  0  . diclofenac sodium (VOLTAREN) 1 % GEL  Apply 2 g topically 4 (four) times daily.  1 Tube  0  . DULoxetine (CYMBALTA) 60 MG capsule Take 60 mg by mouth daily.        . fentaNYL (DURAGESIC - DOSED MCG/HR) 50 MCG/HR Place 1 patch (50 mcg total) onto the skin every 3 (three) days.  5 patch  0  . HYDROcodone-acetaminophen (NORCO/VICODIN) 5-325 MG per tablet Take 1 tablet by mouth every 8 (eight) hours as needed for pain.  60 tablet  0  . levothyroxine (SYNTHROID, LEVOTHROID) 100  MCG tablet Take 100 mcg by mouth daily.        . lipase/protease/amylase (CREON-10/PANCREASE) 12000 UNITS CPEP Take 2 capsules by mouth 3 (three) times daily before meals.  270 capsule  0  . metoprolol tartrate (LOPRESSOR) 25 MG tablet Take 1 tablet (25 mg total) by mouth 2 (two) times daily.  60 tablet  0  . nitroGLYCERIN (NITROSTAT) 0.4 MG SL tablet Place 0.4 mg under the tongue every 5 (five) minutes as needed for chest pain.       . Nutritional Supplements (FEEDING SUPPLEMENT, JEVITY 1.2 CAL,) LIQD Place 1,000 mLs into feeding tube continuous.  1000 mL  0  . Nutritional Supplements (FEEDING SUPPLEMENT, VITAL AF 1.2 CAL,) LIQD Place 1,000 mLs into feeding tube continuous.  1000 mL  60  . oxyCODONE (OXY IR/ROXICODONE) 5 MG immediate release tablet Take 1-2 tablets (5-10 mg total) by mouth every 4 (four) hours as needed.  30 tablet  0  . pantoprazole (PROTONIX) 40 MG tablet Take 1 tablet (40 mg total) by mouth daily at 6 (six) AM.  30 tablet  0  . polyethylene glycol (MIRALAX / GLYCOLAX) packet Take 17 g by mouth daily.  14 each  0   No facility-administered medications prior to visit.   Allergies  Allergen Reactions  . Amoxicillin   . Dilaudid (Hydromorphone Hcl) Swelling   Patient Active Problem List  Diagnosis  . Back pain  . Stroke  . Fibromyalgia  . Myocardial infarct  . CAD (coronary artery disease) of artery bypass graft  . Hypokalemia  . Abnormal LFTs (liver function tests)  . Unspecified hypothyroidism  . Depressive disorder, not elsewhere classified  . Common bile duct (CBD) ?stricture  . Elevated triglycerides with high cholesterol  . Pancreatic pseudocyst from acute pancreatitis Dx 06/10/2012  . Necrotizing pancreatitis   History  Substance Use Topics  . Smoking status: Passive Smoke Exposure - Never Smoker  . Smokeless tobacco: Never Used  . Alcohol Use: No   family history includes Cancer in his father and Diabetes in his brothers and mother.     Objective:    Physical Exam well-developed Hispanic male in no acute distress blood pressure 110/70 pulse 64 height 5 foot 6 weight 164. HEENT; nontraumatic normocephalic EOMI PERRLA sclera anicteric, he has a nasal feeding tube in place, Neck; supple no JVD, Cardiovascular regular rate and rhythm with S1-S2 no murmur rub or gallop, Pulmonary; clear bilaterally, Abdomen; soft nondistended bowel sounds are active mild upper abdominal  tenderness without guarding or rebound no palpable mass or hepatosplenomegaly, Extremities; no clubbing cyanosis or edema skin warm and dry, Psych; mood and affect normal and appropriate.        Assessment & Plan:  #24 57 year old male with acute necrotizing pancreatitis with persistent pancreatic phlegmon and involving pancreatic tail pseudocyst. Last imaging was done 06/10/2012 Patient was discharged from the hospital 5 days ago and is doing very well though still in a nursing facility.  Etiology of his pancreatitis is unclear, this may be EtOH-induced, but will need to have further imaging of biliary tree once his pancreas cools down a bit further #2 status post remote cholecystectomy #3 jaundice associated with initial bout of severe pancreatitis-resolved and felt secondary to pancreatic edema #4 malnutrition-tolerating continuous tube feedings and ready to start liquid diet #5 chronic back pain-chronic dependent, and seeking disability #6 coronary artery disease status post prior MI  Plan; #1 from a GI standpoint he is stable to be discharged from a nursing facility and return home as long as he can continue tube feedings at home. We will start clear liquid diet today and if he tolerates may advance to a full liquid diet next week. If he tolerates full liquids then can discontinue his tube feedings. No be reasonable to convert him to nocturnal tube feedings at this time 12 hours per day to allow better oral intake during the day. #2 will check CBC, CMET, pre-albumin and lipase  today #3 Will plan office followup in 1 week with myself or Dr. Rhea Belton and at that time can decide about discontinuing tube feedings. #4 plan followup CT of the abdomen and pelvis in 2 weeks. #5 anticipate eventual need for EUS and possible tertiary referral to dedicated pancreatic specialist for management of pseudocyst. #6 patient and his wife are cautioned to call for advice  should he develop increased abdominal pain nausea vomiting intolerance to by mouth as or fever of 101 and above.  Addendum: Reviewed and agree with management as well outlined by Justin Gip, PA-C. Beverley Fiedler, MD

## 2012-06-21 ENCOUNTER — Telehealth: Payer: Self-pay | Admitting: Physician Assistant

## 2012-06-21 NOTE — Telephone Encounter (Signed)
Spoke with Ms. Pruitt. Patient was discharged from [facility] today. He had a temp of 100.5 and went by to see his PCP Dr. Dede Query. Dr. Dede Query put him on Levofloxacin 500 mg po daily x 10 days.  Ms. Cresenciano Genre wanted [place] to know about this. Patient will start his TF for 12 hours nightly now. He is on clear liquids po also and will increase to full liquids on 06/24/12.

## 2012-06-21 NOTE — Telephone Encounter (Signed)
Spoke with Belinda at [facility]. She is calling to verify patient's TF schedule. Gave her schedule as per Mike Gip, PA OV note on 06/19/12

## 2012-06-25 ENCOUNTER — Inpatient Hospital Stay (HOSPITAL_COMMUNITY): Payer: Medicare Other

## 2012-06-25 ENCOUNTER — Encounter: Payer: Self-pay | Admitting: Physician Assistant

## 2012-06-25 ENCOUNTER — Encounter (HOSPITAL_COMMUNITY): Payer: Self-pay

## 2012-06-25 ENCOUNTER — Ambulatory Visit (INDEPENDENT_AMBULATORY_CARE_PROVIDER_SITE_OTHER): Payer: Medicare Other | Admitting: Physician Assistant

## 2012-06-25 ENCOUNTER — Inpatient Hospital Stay (HOSPITAL_COMMUNITY)
Admission: AD | Admit: 2012-06-25 | Discharge: 2012-06-28 | DRG: 438 | Disposition: A | Discharge status: Other Acute Inpatient Hospital | Payer: Medicare Other | Source: Ambulatory Visit | Attending: Internal Medicine | Admitting: Internal Medicine

## 2012-06-25 ENCOUNTER — Telehealth: Payer: Self-pay | Admitting: Physician Assistant

## 2012-06-25 ENCOUNTER — Ambulatory Visit: Payer: Medicare Other | Admitting: Physician Assistant

## 2012-06-25 ENCOUNTER — Encounter: Payer: Medicare Other | Admitting: Physician Assistant

## 2012-06-25 VITALS — BP 84/50 | HR 108 | Temp 98.9°F | Ht 66.0 in | Wt 170.4 lb

## 2012-06-25 DIAGNOSIS — K859 Acute pancreatitis without necrosis or infection, unspecified: Secondary | ICD-10-CM

## 2012-06-25 DIAGNOSIS — J9819 Other pulmonary collapse: Secondary | ICD-10-CM | POA: Diagnosis present

## 2012-06-25 DIAGNOSIS — R6510 Systemic inflammatory response syndrome (SIRS) of non-infectious origin without acute organ dysfunction: Secondary | ICD-10-CM | POA: Diagnosis present

## 2012-06-25 DIAGNOSIS — K219 Gastro-esophageal reflux disease without esophagitis: Secondary | ICD-10-CM | POA: Diagnosis present

## 2012-06-25 DIAGNOSIS — K831 Obstruction of bile duct: Secondary | ICD-10-CM

## 2012-06-25 DIAGNOSIS — K862 Cyst of pancreas: Secondary | ICD-10-CM

## 2012-06-25 DIAGNOSIS — I25810 Atherosclerosis of coronary artery bypass graft(s) without angina pectoris: Secondary | ICD-10-CM

## 2012-06-25 DIAGNOSIS — Z951 Presence of aortocoronary bypass graft: Secondary | ICD-10-CM

## 2012-06-25 DIAGNOSIS — R079 Chest pain, unspecified: Secondary | ICD-10-CM | POA: Diagnosis present

## 2012-06-25 DIAGNOSIS — E871 Hypo-osmolality and hyponatremia: Secondary | ICD-10-CM | POA: Diagnosis present

## 2012-06-25 DIAGNOSIS — K863 Pseudocyst of pancreas: Secondary | ICD-10-CM

## 2012-06-25 DIAGNOSIS — R945 Abnormal results of liver function studies: Secondary | ICD-10-CM

## 2012-06-25 DIAGNOSIS — I2510 Atherosclerotic heart disease of native coronary artery without angina pectoris: Secondary | ICD-10-CM | POA: Diagnosis present

## 2012-06-25 DIAGNOSIS — I219 Acute myocardial infarction, unspecified: Secondary | ICD-10-CM

## 2012-06-25 DIAGNOSIS — D7389 Other diseases of spleen: Secondary | ICD-10-CM | POA: Diagnosis present

## 2012-06-25 DIAGNOSIS — I252 Old myocardial infarction: Secondary | ICD-10-CM

## 2012-06-25 DIAGNOSIS — J9600 Acute respiratory failure, unspecified whether with hypoxia or hypercapnia: Secondary | ICD-10-CM | POA: Diagnosis not present

## 2012-06-25 DIAGNOSIS — Z79899 Other long term (current) drug therapy: Secondary | ICD-10-CM

## 2012-06-25 DIAGNOSIS — Z8673 Personal history of transient ischemic attack (TIA), and cerebral infarction without residual deficits: Secondary | ICD-10-CM

## 2012-06-25 DIAGNOSIS — K8591 Acute pancreatitis with uninfected necrosis, unspecified: Secondary | ICD-10-CM

## 2012-06-25 DIAGNOSIS — E876 Hypokalemia: Secondary | ICD-10-CM | POA: Diagnosis present

## 2012-06-25 DIAGNOSIS — F329 Major depressive disorder, single episode, unspecified: Secondary | ICD-10-CM

## 2012-06-25 DIAGNOSIS — D649 Anemia, unspecified: Secondary | ICD-10-CM | POA: Diagnosis present

## 2012-06-25 DIAGNOSIS — Z6827 Body mass index (BMI) 27.0-27.9, adult: Secondary | ICD-10-CM

## 2012-06-25 DIAGNOSIS — E782 Mixed hyperlipidemia: Secondary | ICD-10-CM | POA: Diagnosis present

## 2012-06-25 DIAGNOSIS — E46 Unspecified protein-calorie malnutrition: Secondary | ICD-10-CM | POA: Diagnosis present

## 2012-06-25 DIAGNOSIS — R7989 Other specified abnormal findings of blood chemistry: Secondary | ICD-10-CM

## 2012-06-25 DIAGNOSIS — F3289 Other specified depressive episodes: Secondary | ICD-10-CM | POA: Diagnosis present

## 2012-06-25 DIAGNOSIS — M549 Dorsalgia, unspecified: Secondary | ICD-10-CM

## 2012-06-25 DIAGNOSIS — K861 Other chronic pancreatitis: Secondary | ICD-10-CM | POA: Diagnosis present

## 2012-06-25 DIAGNOSIS — IMO0001 Reserved for inherently not codable concepts without codable children: Secondary | ICD-10-CM | POA: Diagnosis present

## 2012-06-25 DIAGNOSIS — R509 Fever, unspecified: Secondary | ICD-10-CM

## 2012-06-25 DIAGNOSIS — E039 Hypothyroidism, unspecified: Secondary | ICD-10-CM

## 2012-06-25 LAB — CBC
HCT: 30 % — ABNORMAL LOW (ref 39.0–52.0)
Hemoglobin: 10.2 g/dL — ABNORMAL LOW (ref 13.0–17.0)
MCH: 28 pg (ref 26.0–34.0)
MCHC: 34 g/dL (ref 30.0–36.0)
RDW: 14.3 % (ref 11.5–15.5)

## 2012-06-25 LAB — URINALYSIS, ROUTINE W REFLEX MICROSCOPIC
Bilirubin Urine: NEGATIVE
Hgb urine dipstick: NEGATIVE
Ketones, ur: NEGATIVE mg/dL
Protein, ur: 30 mg/dL — AB
Urobilinogen, UA: 1 mg/dL (ref 0.0–1.0)

## 2012-06-25 LAB — COMPREHENSIVE METABOLIC PANEL
Albumin: 2.3 g/dL — ABNORMAL LOW (ref 3.5–5.2)
BUN: 13 mg/dL (ref 6–23)
Calcium: 8.6 mg/dL (ref 8.4–10.5)
GFR calc Af Amer: >90 mL/min (ref >90)
Glucose, Bld: 129 mg/dL — ABNORMAL HIGH (ref 70–99)
Total Protein: 6.7 g/dL (ref 6.0–8.3)

## 2012-06-25 LAB — LACTIC ACID, PLASMA: Lactic Acid, Venous: 1.2 mmol/L (ref 0.5–2.2)

## 2012-06-25 LAB — PROTIME-INR
INR: 1.18 (ref 0.00–1.49)
Prothrombin Time: 14.8 seconds (ref 11.6–15.2)

## 2012-06-25 LAB — SODIUM, URINE, RANDOM: Sodium, Ur: <10 mEq/L

## 2012-06-25 LAB — TSH: TSH: 24.587 u[IU]/mL — ABNORMAL HIGH (ref 0.350–4.500)

## 2012-06-25 MED ORDER — LEVOTHYROXINE SODIUM 200 MCG IV SOLR
50.0000 ug | Freq: Every day | INTRAVENOUS | Status: DC
  Filled 2012-06-25: qty 5

## 2012-06-25 MED ORDER — ACETAMINOPHEN 160 MG/5ML PO SOLN
650.0000 mg | ORAL | Status: DC | PRN
  Administered 2012-06-25 – 2012-06-27 (×3): 650 mg
  Filled 2012-06-25 (×3): qty 20.3

## 2012-06-25 MED ORDER — IOHEXOL 300 MG/ML  SOLN
25.0000 mL | INTRAMUSCULAR | Status: AC
  Administered 2012-06-25 (×2): 25 mL via ORAL

## 2012-06-25 MED ORDER — KCL IN DEXTROSE-NACL 10-5-0.45 MEQ/L-%-% IV SOLN
INTRAVENOUS | Status: DC
  Filled 2012-06-25 (×2): qty 1000

## 2012-06-25 MED ORDER — NITROGLYCERIN 0.3 MG SL SUBL
0.3000 mg | SUBLINGUAL_TABLET | SUBLINGUAL | Status: DC | PRN
  Administered 2012-06-27: 0.3 mg via SUBLINGUAL
  Filled 2012-06-25: qty 100

## 2012-06-25 MED ORDER — MORPHINE SULFATE 4 MG/ML IJ SOLN
4.0000 mg | INTRAMUSCULAR | Status: DC | PRN
  Administered 2012-06-25 – 2012-06-26 (×6): 4 mg via INTRAVENOUS
  Filled 2012-06-25 (×6): qty 1

## 2012-06-25 MED ORDER — SODIUM CHLORIDE 0.9 % IV BOLUS (SEPSIS)
125.0000 mL | Freq: Once | INTRAVENOUS | Status: DC
  Administered 2012-06-25: 125 mL via INTRAVENOUS

## 2012-06-25 MED ORDER — SODIUM CHLORIDE 0.9 % IV BOLUS (SEPSIS)
1000.0000 mL | Freq: Once | INTRAVENOUS | Status: AC
  Administered 2012-06-25: 1000 mL via INTRAVENOUS

## 2012-06-25 MED ORDER — ENOXAPARIN SODIUM 40 MG/0.4ML ~~LOC~~ SOLN
40.0000 mg | SUBCUTANEOUS | Status: DC
  Administered 2012-06-25 – 2012-06-26 (×2): 40 mg via SUBCUTANEOUS
  Filled 2012-06-25 (×2): qty 0.4

## 2012-06-25 MED ORDER — IOHEXOL 300 MG/ML  SOLN
100.0000 mL | Freq: Once | INTRAMUSCULAR | Status: AC | PRN
  Administered 2012-06-25: 100 mL via INTRAVENOUS

## 2012-06-25 MED ORDER — ONDANSETRON HCL 4 MG/2ML IJ SOLN
4.0000 mg | Freq: Four times a day (QID) | INTRAMUSCULAR | Status: DC | PRN
  Administered 2012-06-25: 4 mg via INTRAVENOUS
  Filled 2012-06-25: qty 2

## 2012-06-25 MED ORDER — SODIUM CHLORIDE 0.9 % IJ SOLN
3.0000 mL | Freq: Two times a day (BID) | INTRAMUSCULAR | Status: DC
  Administered 2012-06-25 – 2012-06-28 (×6): 3 mL via INTRAVENOUS

## 2012-06-25 MED ORDER — LEVOTHYROXINE SODIUM 100 MCG PO TABS
100.0000 ug | ORAL_TABLET | Freq: Every day | ORAL | Status: DC
  Filled 2012-06-25: qty 1

## 2012-06-25 MED ORDER — ONDANSETRON HCL 4 MG PO TABS: 4.0000 mg | ORAL_TABLET | Freq: Four times a day (QID) | ORAL | Status: DC | PRN

## 2012-06-25 MED ORDER — LEVOTHYROXINE SODIUM 100 MCG IV SOLR
52.0000 ug | Freq: Every day | INTRAVENOUS | Status: DC
  Filled 2012-06-25 (×2): qty 5

## 2012-06-25 MED ORDER — SODIUM CHLORIDE 0.9 % IV SOLN
500.0000 mg | Freq: Four times a day (QID) | INTRAVENOUS | Status: DC
  Administered 2012-06-25 – 2012-06-28 (×14): 500 mg via INTRAVENOUS
  Filled 2012-06-25 (×17): qty 500

## 2012-06-25 MED ORDER — SODIUM CHLORIDE 0.9 % IV BOLUS (SEPSIS)
150.0000 mL | Freq: Once | INTRAVENOUS | Status: DC
  Administered 2012-06-25: 150 mL via INTRAVENOUS

## 2012-06-25 MED ORDER — DULOXETINE HCL 60 MG PO CPEP
60.0000 mg | ORAL_CAPSULE | Freq: Every day | ORAL | Status: DC
  Filled 2012-06-25: qty 1

## 2012-06-25 MED ORDER — PANTOPRAZOLE SODIUM 40 MG IV SOLR
40.0000 mg | INTRAVENOUS | Status: DC
  Administered 2012-06-25 – 2012-06-27 (×3): 40 mg via INTRAVENOUS
  Filled 2012-06-25 (×5): qty 40

## 2012-06-25 NOTE — H&P (Signed)
Primary Care Physician:  [name]., MD Primary Gastroenterologist:    Dr. Chestine Spore  CHIEF COMPLAINT:  Severe pancreatitis with increasing abdominal pain, fever and weakness  HPI: Justin [name] is a 57 y.o. male known recently to the GI service and initially seen while he was hospitalized at [facility], with acute necrotizing pancreatitis. He was hospitalized from 05/30/2012 through 06/14/2012. Abdomen initially he had acute severe pancreatitis with Sirs and documented pancreatic necrosis. At initial presentation he was jaundiced however his liver tests improved with time it was felt that this was due to extrinsic compression of the bile duct from pancreatic edema. Etiology of his pancreatitis is unclear however he does have prior history of chronic EtOH use he had not been drinking any alcohol over the past 5 years up until the 3-4 months prior to becoming ill when he was told by his primary care to drink some brand EEG evening to help control a chronic cough. He thinks he was drinking perhaps one shot each night. He is status post cholecystectomy. At the time of discharge from the hospital he was unable to tolerate by mouth's and was discharged to a nursing facility for rehabilitation and continue tube feedings. CT of the abdomen and pelvis was done on 06/10/2012 and showed a decrease in intrahepatic ductal dilation. He did have a 5 x 5.5 cm low density collection in his retroperitoneum at the level of the pancreatic body as well as evidence for pancreatic body necrosis. 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 He was discharged with a Duragesic patch and oxycodone when necessary. He was seen once back in the office on 06/19/2012 and at that time was doing significantly better and his pain was much improved. He had begun to have an appetite and had started sipping on clears without difficulty. He had not had any documented fevers but had been having some sweats.  Plans were made for very gradual diet advancement to full liquids and conversion to nocturnal tube feedings. It was anticipated he may be able to be discharged from the nursing facility. He was indeed discharged from the [facility] on 06/20/2012 and had home nursing help. His wife states that he had spiked a fever prior to discharge and he started on him on Levaquin on Friday the day of discharge. Unfortunately since then he has declined and has been complaining of progressive abdominal pain rated this point he says his pain is just about as bad as it was when he was in the hospital quite sick. He has also been having intermittent sweats and his wife documented temp to 102.7 over the weekend . He has had persistent intermittent fevers. He is been unable to progress any on his diet but is able to take some clears and this taking water. He has not developed any diarrhea. When seen in the office today he looks ill, was able to and delayed but blood pressure is low at 84/50 and he is tachycardic at 110. We received labs which had been done on Friday 06/21/2012 and at that time his WBC was up to 14.7 hemoglobin 11.6 hematocrit of 35 liver tests were normal with the exception of an alkaline phosphatase of 137 and creatinine was 0.78. He will require readmission to the hospital today.   Past Medical History  Diagnosis Date  . Back pain   . Hypertension   . Asthma   . Thyroid disease   . High cholesterol   .  Stroke   . Fibromyalgia   . Myocardial infarct   . GERD (gastroesophageal reflux disease)   . Pancreatitis   . Pneumonia   . Alcoholism     Past Surgical History  Procedure Laterality Date  . Back surgery    . Cholecystectomy  2009  . Finger surgery      Prior to Admission medications   Medication Sig Start Date End Date Taking? Authorizing Provider  aspirin 81 MG chewable tablet Chew 1 tablet (81 mg total) by mouth daily. 06/13/12  Yes Marianne L York, PA-C  bisacodyl (DULCOLAX) 10 MG  suppository Place 1 suppository (10 mg total) rectally daily as needed. 06/13/12  Yes Marianne L York, PA-C  capsicum (ZOSTRIX) 0.075 % topical cream Apply topically 2 (two) times daily. 06/14/12  Yes Tora Kindred York, PA-C  diclofenac sodium (VOLTAREN) 1 % GEL Apply 2 g topically 4 (four) times daily. 06/14/12  Yes Marianne L York, PA-C  DULoxetine (CYMBALTA) 60 MG capsule Take 60 mg by mouth daily.     Yes Historical Provider, MD  fentaNYL (DURAGESIC - DOSED MCG/HR) 50 MCG/HR Place 1 patch (50 mcg total) onto the skin every 3 (three) days. 06/14/12  Yes Tora Kindred York, PA-C  HYDROcodone-acetaminophen (NORCO/VICODIN) 5-325 MG per tablet Take 1 tablet by mouth every 8 (eight) hours as needed for pain. 06/13/12  Yes Marianne L York, PA-C  levofloxacin (LEVAQUIN) 500 MG tablet Take 500 mg by mouth daily.   Yes Historical Provider, MD  levothyroxine (SYNTHROID, LEVOTHROID) 100 MCG tablet Take 100 mcg by mouth daily.     Yes Historical Provider, MD  lipase/protease/amylase (CREON-10/PANCREASE) 12000 UNITS CPEP Take 2 capsules by mouth 3 (three) times daily before meals. 06/13/12  Yes Tora Kindred York, PA-C  metoprolol tartrate (LOPRESSOR) 25 MG tablet Take 1 tablet (25 mg total) by mouth 2 (two) times daily. 06/14/12  Yes Tora Kindred York, PA-C  nitroGLYCERIN (NITROSTAT) 0.4 MG SL tablet Place 0.4 mg under the tongue every 5 (five) minutes as needed for chest pain.    Yes Historical Provider, MD  Nutritional Supplements (FEEDING SUPPLEMENT, JEVITY 1.2 CAL,) LIQD Place 1,000 mLs into feeding tube continuous. 06/14/12  Yes Marianne L York, PA-C  Nutritional Supplements (FEEDING SUPPLEMENT, VITAL AF 1.2 CAL,) LIQD Place 1,000 mLs into feeding tube continuous. 06/13/12  Yes Tora Kindred York, PA-C  oxyCODONE (OXY IR/ROXICODONE) 5 MG immediate release tablet Take 1-2 tablets (5-10 mg total) by mouth every 4 (four) hours as needed. 06/14/12  Yes Marianne L York, PA-C  pantoprazole (PROTONIX) 40 MG tablet Take 1 tablet (40 mg total) by  mouth daily at 6 (six) AM. 06/14/12  Yes Tora Kindred York, PA-C  polyethylene glycol (MIRALAX / GLYCOLAX) packet Take 17 g by mouth daily. 06/14/12  Yes Stephani Police, PA-C    No current outpatient prescriptions on file.   No current facility-administered medications for this visit.    Allergies as of 06/25/2012 - Review Complete 06/25/2012  Allergen Reaction Noted  . Amoxicillin  09/18/2010  . Dilaudid (hydromorphone hcl) Swelling 09/18/2010    Family History  Problem Relation Age of Onset  . Diabetes Mother   . Diabetes Brother   . Diabetes Brother   . Cancer Father     ? stomach    History   Social History  . Marital Status: Divorced    Spouse Name: N/A    Number of Children: 65  . Years of Education: N/A   Occupational History  . Not  on file.   Social History Main Topics  . Smoking status: Passive Smoke Exposure - Never Smoker  . Smokeless tobacco: Never Used  . Alcohol Use: No  . Drug Use: No     Comment: daily  . Sexually Active: Not on file   Other Topics Concern  . Not on file   Social History Narrative  . No narrative on file    Review of Systems: Pertinent positive and negative review of systems were noted in the above HPI section.  All other review of systems was otherwise negative. Physical Exam: Vital signs in last 24 hours: @VSRANGES @   General:   Alert,  well-developed, cooperative Hispanic male in no acute distress, ill-appearing blood pressure 84/50 pulse 110 temperature 98.9 height 5 foot 6 weight 170 Head:  Normocephalic and atraumatic. Eyes:  Sclera probable early icterus.   Conjunctiva pink. Ears:  Normal auditory acuity. Mouth:  No deformity or lesions.  Neck:  Supple; no masses . Lungs:  Clear throughout to auscultation.   No wheezes, crackles, or rhonchi. No acute distress. Decreased breath sounds bilateral bases Heart: Tachycardia Regular rate and rhythm; no murmurs. Abdomen: Distended, bowel sounds are present he is diffusely  tender with guarding and rebound in the upper abdomen no definite palpable mass or hepatosplenomegaly      Rectal: not done Msk:  Symmetrical without gross deformities.. Pulses:  Normal pulses noted. Extremities:  Without edema. Neurologic:  Alert and  oriented x4;  grossly normal neurologically. Skin:  Intact without significant lesions or rashes. Cervical Nodes:  No significant cervical adenopathy. Psych:  Alert and cooperative. Normal mood and affect.  Lab Results: pending  Studies/Results: No results found.  Impression / Plan: #62 57 year old Hispanic male with acute severe necrotizing pancreatitis with an initial presentation 05/30/2012. Patient has known pancreatic necrosis and involving pseudocysts on last CT 06/10/2012. He presents now with recurrent fevers and hypotension as well as progressively severe abdominal pain He may be getting septic from infected necrosis or infected pseudocyst.  #2 chronic back pain awaiting disability, and had been requiring chronic narcotics prior to developing severe pancreatitis 3 coronary artery disease status post prior MI #4 malnutrition secondary to #1-he has been on tube feedings and will plan to resume 24-hour tube feedings upon rehospitalization  Plan; patient is admitted to the GI service today to step down and will ask for internal medicine/critical care help with management of fluids/sepsis. Will also ask surgery to see and follow Will get stat CT scan of the abdomen and pelvis and then decide whether aspiration is indicated Baseline labs and blood cultures Start IV Primaxin IV pain control-he cannot tolerate.Dilaudid and will use morphine for now, and may need morphine PCA which she had previously. Ultimately he may require transfer to tertiary care center for pseudocyst drainage and debridement.     @RRHLOS @  Amy Esterwood  06/25/2012, 12:02 PM   Sounds bases

## 2012-06-25 NOTE — Telephone Encounter (Signed)
Spoke with patient's EC. She states his stomach is distended again and he feels bad. He has had a fever since he was discharged from the hospital. She is asking for earlier appointment today. Moved him to 11:00 AM.

## 2012-06-25 NOTE — Progress Notes (Signed)
Subjective: 57 y.o. male, with recent diagnosis with necrotizing pancreatitis with pseudocyst was admitted on 05/31/12 discharged on 06/14/12 from [facility] hospitalist service.  He was discharged with PANDA tube to a nursing home for a week.  On 06/21/12 he was discharged from a SNF with low-grade fevers and mild abdominal discomfort which gradually progressed and became very severe today.  This day he also started back on a clear liquid diet which seamed to make the pain worse over the next few days.  He started developing night sweats, headaches, dark urine, and his epigastric abdominal pain became worse, radiates to his back, and associated with nausea.  He c/o anorexia, fatigue, and weakness.  He presented to Dr. Lauro Franklin GI office from where he was admitted directly to SDU with possible diagnosis of infected pseudocyst.  Dr. Michaell Cowing had been following the patient and had recommended that he wait   Objective: Vital signs in last 24 hours: Temp:  [98.4 F (36.9 C)-98.9 F (37.2 C)] 98.4 F (36.9 C) (04/15 1200) Pulse Rate:  [88-108] 88 (04/15 1427) Resp:  [18-20] 20 (04/15 1500) BP: (84-106)/(50-75) 102/67 mmHg (04/15 1453) SpO2:  [95 %-100 %] 96 % (04/15 1427) Weight:  [170 lb 6 oz (77.282 kg)] 170 lb 6 oz (77.282 kg) (04/15 1051)     PE: General: pleasant, WD/WN Hispanic male who is laying in bed in NAD HEENT: head is normocephalic, atraumatic.  Sclera are noninjected.  PERRL.  Ears and nose without any masses or lesions.  Mouth is pink and moist Heart: regular, rate, and rhythm.  Normal s1,s2. No obvious murmurs, gallops, or rubs noted.  Palpable radial and pedal pulses bilaterally Lungs: CTAB, no wheezes, rhonchi, or rales noted.  Respiratory effort nonlabored Abd:  very tender to palpation >epigastric area, distended and some what rigid, early peritoneal signs, +BS MS: all 4 extremities are symmetrical with no cyanosis, clubbing, or edema. Skin: warm and dry, pale, no rashes Psych:  A&Ox3 with an appropriate affect.   Lab Results:   Recent Labs  06/25/12 1300  WBC 15.0*  HGB 10.2*  HCT 30.0*  PLT 274   BMET  Recent Labs  06/25/12 1300  NA 131*  K 3.7  CL 95*  CO2 29  GLUCOSE 129*  BUN 13  CREATININE 0.73  CALCIUM 8.6   PT/INR  Recent Labs  06/25/12 1300  LABPROT 14.8  INR 1.18   CMP     Component Value Date/Time   NA 131* 06/25/2012 1300   K 3.7 06/25/2012 1300   CL 95* 06/25/2012 1300   CO2 29 06/25/2012 1300   GLUCOSE 129* 06/25/2012 1300   BUN 13 06/25/2012 1300   CREATININE 0.73 06/25/2012 1300   CALCIUM 8.6 06/25/2012 1300   PROT 6.7 06/25/2012 1300   ALBUMIN 2.3* 06/25/2012 1300   AST 12 06/25/2012 1300   ALT 18 06/25/2012 1300   ALKPHOS 126* 06/25/2012 1300   BILITOT 0.5 06/25/2012 1300   GFRNONAA >90 06/25/2012 1300   GFRAA >90 06/25/2012 1300   Lipase     Component Value Date/Time   LIPASE 90* 06/25/2012 1300       Studies/Results: No results found.  Anti-infectives: Anti-infectives   Start     Dose/Rate Route Frequency Ordered Stop   06/25/12 1330  imipenem-cilastatin (PRIMAXIN) 500 mg in sodium chloride 0.9 % 100 mL IVPB     500 mg 200 mL/hr over 30 Minutes Intravenous 4 times per day 06/25/12 1212  Assessment/Plan Necrotizing Pancreatitis with pseudocysts evolution:  1. Conservative management for now:  NPO, hold tube feeds, start Primaxin (only Abx to be of utility in necrotizing pancreatitis).  2. CT results pending, Dr. Daphine Deutscher to follow up on, Dr. Michaell Cowing notified of patients re-admission 3. HOLD OFF ON PERC DRAINAGE AS THIS WILL ACTUALLY HINDER HEALING. Original plan included: pseudocysts heal on their own vs internal drainage if persists after 6 weeks of Dx when the cyst wall is well formed with lap assisted transgastric pseudocyst-gastrostomy.  Will await Dr. Michaell Cowing & Dr. Ermalene Searing recommendations for surgical intervention, but for now conservative tx. 4. GI not recommending ERCP at this time. They were  considering EUS +/- ERCP to further evaluate at last admission 5. Would hold of on advancing diet.  He has had multiple trials of clears on last admission and every time he seems to have worsening symptoms.  6.  Dr. Daphine Deutscher gave okay to try and aspirate fluid to get gram stain/culture  Spent 65 minutes    LOS: 0 days    Aris Georgia 06/25/2012, 4:21 PM Pager: [phone]

## 2012-06-25 NOTE — Consult Note (Signed)
[facility] Consult Note                                                                                    Patient Demographics  Justin [name], is a 57 y.o. male  CSN: [id number]  MRN: [id number]  DOB - 1955-08-26  Admit Date - 06/25/2012  Outpatient Primary MD for the patient is [name]., MD  Consult requested in the Hospital by Beverley Fiedler, MD, On 06/25/2012    Reason for consult Hypotension   With History of -  Past Medical History  Diagnosis Date  . Back pain   . Hypertension   . Asthma   . Thyroid disease   . High cholesterol   . Stroke   . Fibromyalgia   . Myocardial infarct   . GERD (gastroesophageal reflux disease)   . Pancreatitis   . Pneumonia   . Alcoholism       Past Surgical History  Procedure Laterality Date  . Back surgery    . Cholecystectomy  2009  . Finger surgery       HPI  Justin [name]  is a 57 y.o. male, with history of high triglycerides, CAD post CABG, recent diagnosis with pancreatitis with pseudocyst was discharged a few weeks ago from [facility] from GI service, apparently patient finished his course of antibiotics, was then discharged to nursing home few weeks ago with NG tube along with tube feeds, about 4 days ago he he was discharged from the nursing home with low-grade fevers and mild abdominal discomfort with gradually progressed, he started developing night sweats and his abdominal pain became worse, pain is in the epigastric area radiating to his back, associated with nausea, no aggravating or relieving factors, he presented to Dr. Alcide Clever GI office from where he was admitted directly to step down unit with possible diagnosis of infected pseudocyst, upon admission blood pressures were noted to be low and I was consulted to manage low blood pressure.  Of note currently no labs are back, he is due for a CT scan and lab work, besides his abdominal pain discomfort and low-grade fevers he  symptom-free. Does condone to generalized weakness all over. No chest pain, no cough shortness of breath, no focal weakness.    Review of Systems    In addition to the HPI above,  No Fever-chills, No Headache, No changes with Vision or hearing, No problems swallowing food or Liquids, No Chest pain, Cough or Shortness of Breath, + Abdominal pain, No Nausea or Vommitting, Bowel movements are regular, No Blood in stool or Urine, No dysuria, No new skin rashes or bruises, No new joints pains-aches,  No new weakness, tingling, numbness in any extremity, generalized weakness No recent weight gain or loss, No polyuria, polydypsia or polyphagia, No significant Mental Stressors.  A full 10 point Review of Systems was done, except as stated above, all other Review of Systems were negative.   Social History History  Substance Use Topics  . Smoking status: Passive Smoke Exposure - Never Smoker  . Smokeless tobacco: Never Used  . Alcohol Use: No      Family History Family History  Problem Relation Age of Onset  . Diabetes Mother   . Diabetes Brother   . Diabetes Brother   . Cancer Father     ? stomach      Prior to Admission medications   Medication Sig Start Date End Date Taking? Authorizing Provider  aspirin 81 MG chewable tablet Chew 1 tablet (81 mg total) by mouth daily. 06/13/12   Tora Kindred York, PA-C  bisacodyl (DULCOLAX) 10 MG suppository Place 1 suppository (10 mg total) rectally daily as needed. 06/13/12   Tora Kindred York, PA-C  capsicum (ZOSTRIX) 0.075 % topical cream Apply topically 2 (two) times daily. 06/14/12   Tora Kindred York, PA-C  diclofenac sodium (VOLTAREN) 1 % GEL Apply 2 g topically 4 (four) times daily. 06/14/12   Tora Kindred York, PA-C  DULoxetine (CYMBALTA) 60 MG capsule Take 60 mg by mouth daily.      Historical Provider, MD  fentaNYL (DURAGESIC - DOSED MCG/HR) 50 MCG/HR Place 1 patch (50 mcg total) onto the skin every 3 (three) days. 06/14/12   Stephani Police,  PA-C  HYDROcodone-acetaminophen (NORCO/VICODIN) 5-325 MG per tablet Take 1 tablet by mouth every 8 (eight) hours as needed for pain. 06/13/12   Tora Kindred York, PA-C  levofloxacin (LEVAQUIN) 500 MG tablet Take 500 mg by mouth daily.    Historical Provider, MD  levothyroxine (SYNTHROID, LEVOTHROID) 100 MCG tablet Take 100 mcg by mouth daily.      Historical Provider, MD  lipase/protease/amylase (CREON-10/PANCREASE) 12000 UNITS CPEP Take 2 capsules by mouth 3 (three) times daily before meals. 06/13/12   Tora Kindred York, PA-C  metoprolol tartrate (LOPRESSOR) 25 MG tablet Take 1 tablet (25 mg total) by mouth 2 (two) times daily. 06/14/12   Tora Kindred York, PA-C  nitroGLYCERIN (NITROSTAT) 0.4 MG SL tablet Place 0.4 mg under the tongue every 5 (five) minutes as needed for chest pain.     Historical Provider, MD  Nutritional Supplements (FEEDING SUPPLEMENT, JEVITY 1.2 CAL,) LIQD Place 1,000 mLs into feeding tube continuous. 06/14/12   Stephani Police, PA-C  Nutritional Supplements (FEEDING SUPPLEMENT, VITAL AF 1.2 CAL,) LIQD Place 1,000 mLs into feeding tube continuous. 06/13/12   Tora Kindred York, PA-C  oxyCODONE (OXY IR/ROXICODONE) 5 MG immediate release tablet Take 1-2 tablets (5-10 mg total) by mouth every 4 (four) hours as needed. 06/14/12   Tora Kindred York, PA-C  pantoprazole (PROTONIX) 40 MG tablet Take 1 tablet (40 mg total) by mouth daily at 6 (six) AM. 06/14/12   Tora Kindred York, PA-C  polyethylene glycol (MIRALAX / GLYCOLAX) packet Take 17 g by mouth daily. 06/14/12   Stephani Police, PA-C    Anti-infectives   Start     Dose/Rate Route Frequency Ordered Stop   06/25/12 1330  imipenem-cilastatin (PRIMAXIN) 500 mg in sodium chloride 0.9 % 100 mL IVPB     500 mg 200 mL/hr over 30 Minutes Intravenous 4 times per day 06/25/12 1212        Scheduled Meds: . DULoxetine  60 mg Oral Daily  . enoxaparin (LOVENOX) injection  40 mg Subcutaneous Q24H  . imipenem-cilastatin  500 mg Intravenous Q6H  . iohexol  25 mL  Oral Q1 Hr x 2  . [START ON 06/26/2012] levothyroxine  100 mcg Oral QAC breakfast  . pantoprazole (PROTONIX) IV  40 mg Intravenous Q24H  . sodium chloride  1,000 mL Intravenous Once  . sodium chloride  125 mL Intravenous Once  . sodium chloride  3 mL Intravenous Q12H  Continuous Infusions:  PRN Meds:.morphine injection, nitroGLYCERIN, ondansetron (ZOFRAN) IV, ondansetron  Allergies  Allergen Reactions  . Amoxicillin   . Dilaudid (Hydromorphone Hcl) Swelling    Physical Exam  Vitals  Blood pressure 102/71, pulse 106, temperature 98.4 F (36.9 C), temperature source Axillary, resp. rate 18, SpO2 96.00%.   1. General middle-aged Hispanic male lying in bed in mild to moderate abdominal pain  2. Normal affect and insight, Not Suicidal or Homicidal, Awake Alert, Oriented X 3.  3. No F.N deficits, ALL C.Nerves Intact, Strength 5/5 all 4 extremities, Sensation intact all 4 extremities, Plantars down going.  4. Ears and Eyes appear Normal, Conjunctivae clear, PERRLA. Moist Oral Mucosa.  5. Supple Neck, No JVD, No cervical lymphadenopathy appriciated, No Carotid Bruits.  6. Symmetrical Chest wall movement, Good air movement bilaterally, CTAB.  7. RRR, No Gallops, Rubs or Murmurs, No Parasternal Heave.  8. Positive Bowel Sounds, Abdomen mildly rigid with epigastric tenderness, there is some voluntary guarding  9.  No Cyanosis, Normal Skin Turgor, No Skin Rash or Bruise.  10. Good muscle tone,  joints appear normal , no effusions, Normal ROM.  11. No Palpable Lymph Nodes in Neck or Axillae    Data Review  CBC  Recent Labs Lab 06/19/12 1434  WBC 7.5  HGB 12.5*  HCT 38.1*  PLT 405.0*  MCV 87.1  MCHC 32.8  RDW 14.6  LYMPHSABS 1.6  MONOABS 0.5  EOSABS 0.0  BASOSABS 0.0   ------------------------------------------------------------------------------------------------------------------  Chemistries   Recent Labs Lab 06/19/12 1434  NA 139  K 3.9  CL 100  CO2  32  GLUCOSE 98  BUN 12  CREATININE 0.9  CALCIUM 9.0  AST 21  ALT 32  ALKPHOS 138*  BILITOT 0.8   ------------------------------------------------------------------------------------------------------------------ CrCl is unknown because both a height and weight (above a minimum accepted value) are required for this calculation. ------------------------------------------------------------------------------------------------------------------ No results found for this basename: TSH, T4TOTAL, FREET3, T3FREE, THYROIDAB,  in the last 72 hours   Coagulation profile No results found for this basename: INR, PROTIME,  in the last 168 hours ------------------------------------------------------------------------------------------------------------------- No results found for this basename: DDIMER,  in the last 72 hours -------------------------------------------------------------------------------------------------------------------  Cardiac Enzymes No results found for this basename: CK, CKMB, TROPONINI, MYOGLOBIN,  in the last 168 hours ------------------------------------------------------------------------------------------------------------------ No components found with this basename: POCBNP,    ---------------------------------------------------------------------------------------------------------------  Urinalysis    Component Value Date/Time   COLORURINE YELLOW 05/30/2012 2025   APPEARANCEUR CLEAR 05/30/2012 2025   LABSPEC 1.015 05/30/2012 2025   PHURINE 6.0 05/30/2012 2025   GLUCOSEU NEGATIVE 05/30/2012 2025   HGBUR NEGATIVE 05/30/2012 2025   BILIRUBINUR NEGATIVE 05/30/2012 2025   KETONESUR NEGATIVE 05/30/2012 2025   PROTEINUR NEGATIVE 05/30/2012 2025   UROBILINOGEN 0.2 05/30/2012 2025   NITRITE NEGATIVE 05/30/2012 2025   LEUKOCYTESUR NEGATIVE 05/30/2012 2025     Imaging results:      Assessment & Plan   1. Epigastric abdominal pain hypotension- patient directly admitted  from GI office with suspected infected pancreatic pseudocyst with SIRS, he is due for lab work along with CT scan of the abdomen, for now we'll keep him n.p.o., agree with broad-spectrum Primaxin, blood cultures have been ordered, will order stat CBC, CMP, lipase. Normal saline bolus followed by maintenance normal saline till blood pressure improves, after that he could be switched to D5 once blood pressures are stable. We'll defer TNA in case that is needed to GI primary service. I have been told general surgery has been consulted, if  patient remains hypotensive consult pulmonary critical care. I have given my recommendations to GI PA in person.    2. History of hypothyroidism. He is n.p.o. will place him on IV Synthroid, check baseline TSH.    3. History of dyslipidemia with elevated triglycerides. Once taking by mouth this will be addressed by GI.    4. CAD. No acute issues. He is pain-free, supportive care for now.    Thank you for the consult we will follow the patient with you in the hospital.   DVT Prophylaxis   Lovenox    AM Labs Ordered, also please review Full Orders  Family Communication: Admission, patients condition and plan of care including tests being ordered have been discussed with the patient and wife who indicate understanding and agree with the plan and Code Status.  Code Status Full  Disposition Plan: TBD  Time spent in minutes : 35  Condition GUARDED   Leroy Sea M.D on 06/25/2012 at 12:57 PM  Between 7am to 7pm - Pager - [phone]  After 7pm go to [email or web address] - password [email or web address]  And look for the night coverage person covering me after hours   Thank you for the consult, we will follow the patient with you in the [facility]   [facility] Office  [phone]

## 2012-06-25 NOTE — Plan of Care (Signed)
Problem: Phase I Progression Outcomes Goal: Pain controlled with appropriate interventions Outcome: Progressing 4mg  Morphine IV Q 4 hrs PRN

## 2012-06-25 NOTE — H&P (Signed)
See H&P dictated by Mike Gip, PA-C in the chart for full details.  The patient was seen in the office today and then admitted to [facility] step-down with concern of recurrent pancreatitis.  He was recently discharged from rehab facility where he was recovering after nearly 2 week hospital with acute necrotizing pancreatitis with pseudocyst formation. --Now with recurrent pancreatitis, with fever, SIRS criteria occuring after trying to advance diet --CT repeated which shows acute pancreatitis with necrosis of neck and body, increasing cyst size with internal air bubbles.  Also concern for SV thrombosis, but patent PV.   --Leukocytosis to 15, LFTs okay, though alk phos very mildly elevated  --IV abx with imipenem, blood cultures, NPO, will continue enteral feedings for now, pain control, aggressive IV hydration, IR consult for aspiration/culture of necrotic pancreas for culture --Gen Surg following and appreciate their assistance --Pt's wife has requested transfer to [facility] to "pancreatic center".  I have contacted the transfer center and discussed with hospitalist, Dr. Doristine Counter.  Pt accepted, but no beds.  Is on the waiting list for transfer to medical step down bed.  Surgery consultant at [facility] to discuss case with Dr. Daphine Deutscher ([facility]) at request of [facility] MD as surgery likely to be involved after patient transfer --If patient fails to respond to above measures, will involve critical care medicine team at [facility]

## 2012-06-25 NOTE — Progress Notes (Signed)
Patient ID: Justin [name], male   DOB: 04-06-1955, 57 y.o.   MRN: [id number] Patient was seen in the office today in followup for severe necrotizing pancreatitis with evolving pseudocysts. Unfortunately he has declined over the past week since last seen and is now spiking fevers and complaining of marked increase in abdominal pain. He is noted to be hypotensive and tachycardic in the office and will be readmitted to the hospital. He see the dictated H&P

## 2012-06-26 DIAGNOSIS — J9600 Acute respiratory failure, unspecified whether with hypoxia or hypercapnia: Secondary | ICD-10-CM | POA: Insufficient documentation

## 2012-06-26 DIAGNOSIS — E039 Hypothyroidism, unspecified: Secondary | ICD-10-CM

## 2012-06-26 DIAGNOSIS — K859 Acute pancreatitis without necrosis or infection, unspecified: Principal | ICD-10-CM

## 2012-06-26 DIAGNOSIS — E46 Unspecified protein-calorie malnutrition: Secondary | ICD-10-CM | POA: Diagnosis present

## 2012-06-26 LAB — CBC
Hemoglobin: 10.2 g/dL — ABNORMAL LOW (ref 13.0–17.0)
MCHC: 33.8 g/dL (ref 30.0–36.0)
RBC: 3.65 MIL/uL — ABNORMAL LOW (ref 4.22–5.81)
WBC: 15.2 10*3/uL — ABNORMAL HIGH (ref 4.0–10.5)

## 2012-06-26 LAB — COMPREHENSIVE METABOLIC PANEL
ALT: 19 U/L (ref 0–53)
Alkaline Phosphatase: 124 U/L — ABNORMAL HIGH (ref 39–117)
Chloride: 95 mEq/L — ABNORMAL LOW (ref 96–112)
GFR calc Af Amer: >90 mL/min (ref >90)
Glucose, Bld: 198 mg/dL — ABNORMAL HIGH (ref 70–99)
Potassium: 3.7 mEq/L (ref 3.5–5.1)
Sodium: 131 mEq/L — ABNORMAL LOW (ref 135–145)
Total Protein: 6.3 g/dL (ref 6.0–8.3)

## 2012-06-26 LAB — URINE CULTURE: Culture: NO GROWTH

## 2012-06-26 LAB — T4: T4, Total: 5.9 ug/dL (ref 5.0–12.5)

## 2012-06-26 LAB — OSMOLALITY, URINE: Osmolality, Ur: 497 mOsm/kg (ref 390–1090)

## 2012-06-26 LAB — T3, FREE: T3, Free: 1.4 pg/mL — ABNORMAL LOW (ref 2.3–4.2)

## 2012-06-26 LAB — OSMOLALITY: Osmolality: 276 mOsm/kg (ref 275–300)

## 2012-06-26 MED ORDER — LIDOCAINE 5 % EX PTCH
1.0000 | MEDICATED_PATCH | CUTANEOUS | Status: DC
  Administered 2012-06-26 – 2012-06-27 (×2): 1 via TRANSDERMAL
  Filled 2012-06-26 (×3): qty 1

## 2012-06-26 MED ORDER — MORPHINE SULFATE 4 MG/ML IJ SOLN
4.0000 mg | INTRAMUSCULAR | Status: DC | PRN
  Administered 2012-06-26 – 2012-06-28 (×7): 4 mg via INTRAVENOUS
  Filled 2012-06-26 (×7): qty 1

## 2012-06-26 MED ORDER — LEVOTHYROXINE SODIUM 100 MCG IV SOLR
75.0000 ug | Freq: Every day | INTRAVENOUS | Status: DC
  Administered 2012-06-26 – 2012-06-28 (×3): 75 ug via INTRAVENOUS
  Filled 2012-06-26 (×4): qty 5

## 2012-06-26 MED ORDER — CHLORPROMAZINE HCL 25 MG PO TABS
25.0000 mg | ORAL_TABLET | Freq: Two times a day (BID) | ORAL | Status: DC | PRN
  Administered 2012-06-26 – 2012-06-27 (×2): 25 mg via ORAL
  Filled 2012-06-26 (×4): qty 1

## 2012-06-26 MED ORDER — SODIUM CHLORIDE 0.9 % IV SOLN
INTRAVENOUS | Status: AC
Start: 2012-06-26 — End: 2012-06-28
  Administered 2012-06-26: 100 mL/h via INTRAVENOUS
  Administered 2012-06-27: 21:00:00 via INTRAVENOUS

## 2012-06-26 MED ORDER — SODIUM CHLORIDE 0.9 % IV BOLUS (SEPSIS): 125.0000 mL | INTRAVENOUS | Status: DC

## 2012-06-26 NOTE — Progress Notes (Signed)
[facility] Progress Note  Subjective:  Patient with a lot of pain when he coughs or has the hiccups; yells out in pain when he hiccups.  Just got some pain meds so he can rest.  Still having some fevers and remains tachy.  BP's ok.  Objective:  Vital signs in last 24 hours: Temp:  [97.6 F (36.4 C)-101 F (38.3 C)] 97.6 F (36.4 C) (04/16 0800) Pulse Rate:  [88-128] 125 (04/16 0400) Resp:  [18-25] 24 (04/16 0400) BP: (84-120)/(50-75) 120/66 mmHg (04/16 0400) SpO2:  [89 %-100 %] 92 % (04/16 0400) Weight:  [168 lb 6.9 oz (76.4 kg)-170 lb 6 oz (77.282 kg)] 168 lb 6.9 oz (76.4 kg) (04/15 1300) Last BM Date: 06/25/12 General:   Alert, ill-appearing, uncomfortable. Heart:  Tachy with regular rhythm; no murmurs Pulm:  CTAB.  No W/R/R. Abdomen:  Somewhat rigid.  Very tender diffusely but > in epigastrium; has rebound tenderness.  BS present.  Extremities:  Without edema. Neurologic:  Alert and  oriented x4;  grossly normal neurologically. Psych:  Alert and cooperative. Normal mood and affect.  Intake/Output from previous day: 04/15 0701 - 04/16 0700 In: 1990 [NG/GT:1120; IV Piggyback:870] Out: 575 [Urine:575]  Lab Results:  Recent Labs  06/25/12 1300 06/26/12 0335  WBC 15.0* 15.2*  HGB 10.2* 10.2*  HCT 30.0* 30.2*  PLT 274 271   BMET  Recent Labs  06/25/12 1300 06/26/12 0335  NA 131* 131*  K 3.7 3.7  CL 95* 95*  CO2 29 28  GLUCOSE 129* 198*  BUN 13 11  CREATININE 0.73 0.76  CALCIUM 8.6 8.4   LFT  Recent Labs  06/26/12 0335  PROT 6.3  ALBUMIN 2.1*  AST 15  ALT 19  ALKPHOS 124*  BILITOT 0.4   PT/INR  Recent Labs  06/25/12 1300  LABPROT 14.8  INR 1.18   Ct Abdomen Pelvis W Contrast  06/25/2012  *RADIOLOGY REPORT*  Clinical Data: Follow-up necrotizing pancreatitis. Fever.  Nausea and vomiting.  Upper abdominal pain.  CT ABDOMEN AND PELVIS WITH CONTRAST  Technique:  Multidetector CT imaging of the abdomen and pelvis was performed following  the standard protocol during bolus administration of intravenous contrast.  Contrast: OMNIPAQUE IOHEXOL 300 MG/ML  SOLN  Comparison: 06/10/2012  Findings: Increase in bilateral lower lobe atelectasis noted.  Tiny left pleural effusion shows no significant change.  A feeding tube remains in place with the tip in the proximal jejunum.  Acute pancreatitis is again demonstrated, with necrosis involving the pancreatic neck and body.  Increased size of a large peripancreatic fluid collection is seen in the central abdomen, posterior to the stomach and extending inferiorly in the central small bowel mesentery.  This collection now measures 8 x 12 cm compared to 5 x 6 cm previously, consistent with a large pseudocyst.  There are several small internal air bubbles seen within this collection, and infection cannot be excluded.  A smaller fluid collection is seen in the uncinate process of the pancreas which now measures 3.6 x 4.6 cm compared to 2.1 x 2.3 cm previously.  This also consistent with an enlarging pseudocyst. Adjacent reactive thickening of the duodenum and posterior wall of the stomach is again demonstrated.  The splenic vein is not well visualized, suspicious for splenic vein thrombosis.  There is no evidence of portal venous thrombosis.  No evidence of splenomegaly.  Prior cholecystectomy is again noted.  Mild dilatation of central intrahepatic bile duct shows no significant change.  No liver masses  or abscesses are identified.  The adrenal glands are normal appearance.  Tiny nonobstructing bilateral intrarenal calculi are again demonstrated however there is no evidence of renal mass or hydronephrosis.  A small amount of free fluid is seen in the central pelvis, but no discrete pelvic pseudocyst identified.  No pelvic soft tissue masses are identified.  Diverticulosis is again seen involving the descending and sigmoid portions of the colon, however there is no evidence of diverticulitis.  No evidence of  bowel obstruction.  IMPRESSION:  1. Acute pancreatitis, with pancreatic necrosis again seen involving the pancreatic neck and body.  Increased size of developing pancreatic pseudocysts seen.  Internal air bubbles are seen in the largest fluid collection, and superimposed infection cannot be excluded by imaging. 2.  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  No evidence of portal vein thrombosis. 3.  Increased bibasilar atelectasis.   Original Report Authenticated By: Myles Rosenthal, M.D.     Assessment / Plan: #10 57 year old Hispanic male with acute severe necrotizing pancreatitis and pseudocyst with an initial presentation 05/30/2012. He presented on 4/15 with recurrent fevers and hypotension as well as progressively severe abdominal pain (meeting SIRS criteria).  Blood cultures preliminarily negative. #2 chronic back pain awaiting disability, and had been requiring chronic narcotics prior to developing severe pancreatitis  #3 coronary artery disease status post prior MI  #4 malnutrition secondary to #1-he has been on tube feedings and those have been continued.   -Continue broad spectrum antibiotics (on Primaxin).  -IV pain control-he cannot tolerate Dilaudid so is on morphine for now, but may need morphine PCA which he had previously.  -Dr. Rhea Belton has spoke with Dr. Rudean Curt at [facility] and patient has been accepted for transfer, however, he has been placed on the waiting list for a medical step-down bed (no idea on time-line at this point as to when he will be able to transfer). -To consider IR consult for aspiration/culture of necrotic pancreas. -Appreciate surgery and medicine consults with assistance and management.    LOS: 1 day   Reiley Bertagnolli D.  06/26/2012, 9:00 AM  Pager number [phone]

## 2012-06-26 NOTE — Progress Notes (Signed)
Patient ID: Justin [name], male   DOB: [birth year], 57 y.o.   MRN: [id number] [facility] Surgery Progress Note:   * No surgery found *  Subjective: Mental status is clear. Objective: Vital signs in last 24 hours: Temp:  [97.6 F (36.4 C)-101 F (38.3 C)] 97.6 F (36.4 C) (04/16 0800) Pulse Rate:  [88-128] 125 (04/16 0400) Resp:  [18-25] 24 (04/16 0400) BP: (102-120)/(66-75) 120/66 mmHg (04/16 0400) SpO2:  [89 %-100 %] 92 % (04/16 0400) Weight:  [168 lb 6.9 oz (76.4 kg)] 168 lb 6.9 oz (76.4 kg) (04/15 1300)  Intake/Output from previous day: 04/15 0701 - 04/16 0700 In: 1990 [NG/GT:1120; IV Piggyback:870] Out: 575 [Urine:575] Intake/Output this shift: Total I/O In: -  Out: 200 [Urine:200]  Physical Exam: Work of breathing is not labored.  Still having fair amount of pain.    Lab Results:  Results for orders placed during the hospital encounter of 06/25/12 (from the past 48 hour(s))  MRSA PCR SCREENING     Status: None   Collection Time    06/25/12 12:00 PM      Result Value Range   MRSA by PCR NEGATIVE  NEGATIVE   Comment:            The GeneXpert MRSA Assay (FDA     approved for NASAL specimens     only), is one component of a     comprehensive MRSA colonization     surveillance program. It is not     intended to diagnose MRSA     infection nor to guide or     monitor treatment for     MRSA infections.  PROTIME-INR     Status: None   Collection Time    06/25/12  1:00 PM      Result Value Range   Prothrombin Time 14.8  11.6 - 15.2 seconds   INR 1.18  0.00 - 1.49  LIPASE, BLOOD     Status: Abnormal   Collection Time    06/25/12  1:00 PM      Result Value Range   Lipase 90 (*) 11 - 59 U/L  CULTURE, BLOOD (ROUTINE X 2)     Status: None   Collection Time    06/25/12  1:00 PM      Result Value Range   Specimen Description BLOOD LEFT HAND     Special Requests BOTTLES DRAWN AEROBIC AND ANAEROBIC 6CC     Culture  Setup Time 06/25/2012 21:58     Culture        Value:        BLOOD CULTURE RECEIVED NO GROWTH TO DATE CULTURE WILL BE HELD FOR 5 DAYS BEFORE ISSUING A FINAL NEGATIVE REPORT   Report Status PENDING    CBC     Status: Abnormal   Collection Time    06/25/12  1:00 PM      Result Value Range   WBC 15.0 (*) 4.0 - 10.5 K/uL   RBC 3.64 (*) 4.22 - 5.81 MIL/uL   Hemoglobin 10.2 (*) 13.0 - 17.0 g/dL   HCT 40.9 (*) 81.1 - 91.4 %   MCV 82.4  78.0 - 100.0 fL   MCH 28.0  26.0 - 34.0 pg   MCHC 34.0  30.0 - 36.0 g/dL   RDW 78.2  95.6 - 21.3 %   Platelets 274  150 - 400 K/uL  COMPREHENSIVE METABOLIC PANEL     Status: Abnormal   Collection Time    06/25/12  1:00 PM      Result Value Range   Sodium 131 (*) 135 - 145 mEq/L   Potassium 3.7  3.5 - 5.1 mEq/L   Chloride 95 (*) 96 - 112 mEq/L   CO2 29  19 - 32 mEq/L   Glucose, Bld 129 (*) 70 - 99 mg/dL   BUN 13  6 - 23 mg/dL   Creatinine, Ser 1.61  0.50 - 1.35 mg/dL   Calcium 8.6  8.4 - 09.6 mg/dL   Total Protein 6.7  6.0 - 8.3 g/dL   Albumin 2.3 (*) 3.5 - 5.2 g/dL   AST 12  0 - 37 U/L   ALT 18  0 - 53 U/L   Alkaline Phosphatase 126 (*) 39 - 117 U/L   Total Bilirubin 0.5  0.3 - 1.2 mg/dL   GFR calc non Af Amer >90  >90 mL/min   GFR calc Af Amer >90  >90 mL/min   Comment:            The eGFR has been calculated     using the CKD EPI equation.     This calculation has not been     validated in all clinical     situations.     eGFR's persistently     <90 mL/min signify     possible Chronic Kidney Disease.  TSH     Status: Abnormal   Collection Time    06/25/12  1:00 PM      Result Value Range   TSH 24.587 (*) 0.350 - 4.500 uIU/mL  CULTURE, BLOOD (ROUTINE X 2)     Status: None   Collection Time    06/25/12  1:15 PM      Result Value Range   Specimen Description BLOOD LEFT ARM     Special Requests BOTTLES DRAWN AEROBIC AND ANAEROBIC 6CC     Culture  Setup Time 06/25/2012 21:58     Culture       Value:        BLOOD CULTURE RECEIVED NO GROWTH TO DATE CULTURE WILL BE HELD FOR 5 DAYS BEFORE  ISSUING A FINAL NEGATIVE REPORT   Report Status PENDING    LACTIC ACID, PLASMA     Status: None   Collection Time    06/25/12  1:15 PM      Result Value Range   Lactic Acid, Venous 1.2  0.5 - 2.2 mmol/L  OSMOLALITY     Status: None   Collection Time    06/25/12  2:39 PM      Result Value Range   Osmolality 276  275 - 300 mOsm/kg  OSMOLALITY, URINE     Status: None   Collection Time    06/25/12  3:49 PM      Result Value Range   Osmolality, Ur 497  390 - 1090 mOsm/kg  SODIUM, URINE, RANDOM     Status: None   Collection Time    06/25/12  3:49 PM      Result Value Range   Sodium, Ur <10     Comment: REPEATED TO VERIFY  URINALYSIS, ROUTINE W REFLEX MICROSCOPIC     Status: Abnormal   Collection Time    06/25/12  3:49 PM      Result Value Range   Color, Urine YELLOW  YELLOW   APPearance CLEAR  CLEAR   Specific Gravity, Urine 1.025  1.005 - 1.030   pH 6.0  5.0 - 8.0   Glucose, UA NEGATIVE  NEGATIVE mg/dL   Hgb urine dipstick NEGATIVE  NEGATIVE   Bilirubin Urine NEGATIVE  NEGATIVE   Ketones, ur NEGATIVE  NEGATIVE mg/dL   Protein, ur 30 (*) NEGATIVE mg/dL   Urobilinogen, UA 1.0  0.0 - 1.0 mg/dL   Nitrite NEGATIVE  NEGATIVE   Leukocytes, UA NEGATIVE  NEGATIVE  URINE MICROSCOPIC-ADD ON     Status: Abnormal   Collection Time    06/25/12  3:49 PM      Result Value Range   Squamous Epithelial / LPF FEW (*) RARE   WBC, UA 7-10  <3 WBC/hpf   Bacteria, UA FEW (*) RARE  CBC     Status: Abnormal   Collection Time    06/26/12  3:35 AM      Result Value Range   WBC 15.2 (*) 4.0 - 10.5 K/uL   RBC 3.65 (*) 4.22 - 5.81 MIL/uL   Hemoglobin 10.2 (*) 13.0 - 17.0 g/dL   HCT 78.4 (*) 69.6 - 29.5 %   MCV 82.7  78.0 - 100.0 fL   MCH 27.9  26.0 - 34.0 pg   MCHC 33.8  30.0 - 36.0 g/dL   RDW 28.4  13.2 - 44.0 %   Platelets 271  150 - 400 K/uL  COMPREHENSIVE METABOLIC PANEL     Status: Abnormal   Collection Time    06/26/12  3:35 AM      Result Value Range   Sodium 131 (*) 135 - 145 mEq/L    Potassium 3.7  3.5 - 5.1 mEq/L   Chloride 95 (*) 96 - 112 mEq/L   CO2 28  19 - 32 mEq/L   Glucose, Bld 198 (*) 70 - 99 mg/dL   BUN 11  6 - 23 mg/dL   Creatinine, Ser 1.02  0.50 - 1.35 mg/dL   Calcium 8.4  8.4 - 72.5 mg/dL   Total Protein 6.3  6.0 - 8.3 g/dL   Albumin 2.1 (*) 3.5 - 5.2 g/dL   AST 15  0 - 37 U/L   ALT 19  0 - 53 U/L   Alkaline Phosphatase 124 (*) 39 - 117 U/L   Total Bilirubin 0.4  0.3 - 1.2 mg/dL   GFR calc non Af Amer >90  >90 mL/min   GFR calc Af Amer >90  >90 mL/min   Comment:            The eGFR has been calculated     using the CKD EPI equation.     This calculation has not been     validated in all clinical     situations.     eGFR's persistently     <90 mL/min signify     possible Chronic Kidney Disease.  T3, FREE     Status: Abnormal   Collection Time    06/26/12  8:20 AM      Result Value Range   T3, Free 1.4 (*) 2.3 - 4.2 pg/mL  T4     Status: None   Collection Time    06/26/12  8:20 AM      Result Value Range   T4, Total 5.9  5.0 - 12.5 ug/dL  TSH     Status: Abnormal   Collection Time    06/26/12  8:20 AM      Result Value Range   TSH 14.064 (*) 0.350 - 4.500 uIU/mL    Radiology/Results: Ct Abdomen Pelvis W Contrast  06/25/2012  *RADIOLOGY REPORT*  Clinical Data: Follow-up necrotizing  pancreatitis. Fever.  Nausea and vomiting.  Upper abdominal pain.  CT ABDOMEN AND PELVIS WITH CONTRAST  Technique:  Multidetector CT imaging of the abdomen and pelvis was performed following the standard protocol during bolus administration of intravenous contrast.  Contrast: OMNIPAQUE IOHEXOL 300 MG/ML  SOLN  Comparison: 06/10/2012  Findings: Increase in bilateral lower lobe atelectasis noted.  Tiny left pleural effusion shows no significant change.  A feeding tube remains in place with the tip in the proximal jejunum.  Acute pancreatitis is again demonstrated, with necrosis involving the pancreatic neck and body.  Increased size of a large peripancreatic  fluid collection is seen in the central abdomen, posterior to the stomach and extending inferiorly in the central small bowel mesentery.  This collection now measures 8 x 12 cm compared to 5 x 6 cm previously, consistent with a large pseudocyst.  There are several small internal air bubbles seen within this collection, and infection cannot be excluded.  A smaller fluid collection is seen in the uncinate process of the pancreas which now measures 3.6 x 4.6 cm compared to 2.1 x 2.3 cm previously.  This also consistent with an enlarging pseudocyst. Adjacent reactive thickening of the duodenum and posterior wall of the stomach is again demonstrated.  The splenic vein is not well visualized, suspicious for splenic vein thrombosis.  There is no evidence of portal venous thrombosis.  No evidence of splenomegaly.  Prior cholecystectomy is again noted.  Mild dilatation of central intrahepatic bile duct shows no significant change.  No liver masses or abscesses are identified.  The adrenal glands are normal appearance.  Tiny nonobstructing bilateral intrarenal calculi are again demonstrated however there is no evidence of renal mass or hydronephrosis.  A small amount of free fluid is seen in the central pelvis, but no discrete pelvic pseudocyst identified.  No pelvic soft tissue masses are identified.  Diverticulosis is again seen involving the descending and sigmoid portions of the colon, however there is no evidence of diverticulitis.  No evidence of bowel obstruction.  IMPRESSION:  1. Acute pancreatitis, with pancreatic necrosis again seen involving the pancreatic neck and body.  Increased size of developing pancreatic pseudocysts seen.  Internal air bubbles are seen in the largest fluid collection, and superimposed infection cannot be excluded by imaging. 2.  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  No evidence of portal vein thrombosis. 3.  Increased bibasilar atelectasis.   Original Report Authenticated By: Myles Rosenthal, M.D.      Anti-infectives: Anti-infectives   Start     Dose/Rate Route Frequency Ordered Stop   06/25/12 1330  imipenem-cilastatin (PRIMAXIN) 500 mg in sodium chloride 0.9 % 100 mL IVPB     500 mg 200 mL/hr over 30 Minutes Intravenous 4 times per day 06/25/12 1212        Assessment/Plan: Problem List: Patient Active Problem List  Diagnosis  . Back pain  . Stroke  . Fibromyalgia  . Myocardial infarct  . CAD (coronary artery disease) of artery bypass graft  . Hypokalemia  . Abnormal LFTs (liver function tests)  . Unspecified hypothyroidism  . Depressive disorder, not elsewhere classified  . Common bile duct (CBD) ?stricture  . Elevated triglycerides with high cholesterol  . Pancreatic pseudocyst from acute pancreatitis Dx 06/10/2012  . Necrotizing pancreatitis    Awaiting transfer to [facility].  I spoke with Dr. Tracey Harries last night to assist Dr. Rhea Belton with transfer. * No surgery found *    LOS: 1 day   Matt B. Daphine Deutscher, MD,  [facility] [phone] beeper [phone]  06/26/2012 12:29 PM

## 2012-06-26 NOTE — Consult Note (Addendum)
PULMONARY  / CRITICAL CARE MEDICINE  Name: Justin [name] MRN: [id number] DOB: 02/01/1956    ADMISSION DATE:  06/25/2012 CONSULTATION DATE:  4/16  REFERRING MD :  Chestine Spore, [place] [facility] PRIMARY SERVICE:  GI  CHIEF COMPLAINT:  abd pain  BRIEF PATIENT DESCRIPTION:  57 yo male with recent admit with necrotizing pancreatitis ? etoh relatated complicated by pseudocyst re-admitted  From [facility] 4/15 with recurrent abd pain and tachcardia with low bp that responded initially to IV fluids but PCCM service asked to review his care as is felt to be at high risk of sirs/higher level of care  SIGNIFICANT EVENTS / STUDIES:   CT abd 4/15> 1. Acute pancreatitis, with pancreatic necrosis again seen  involving the pancreatic neck and body. Increased size of  developing pancreatic pseudocysts seen. Internal air bubbles are  seen in the largest fluid collection, and superimposed infection  cannot be excluded by imaging.  2. 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 No evidence of portal vein  thrombosis.  3. Increased bibasilar atelectasis  LINES / TUBES:    CULTURES: UC 4/15 > neg MRSA 4/15 > neg BC x2 4/15 >>>   ANTIBIOTICS: Primaxin 4/15 >>>  HISTORY OF PRESENT ILLNESS:   57 y.o. male known recently to the GI service and initially seen while he was hospitalized at [facility], with acute necrotizing pancreatitis. He was hospitalized from 05/30/2012 through 06/14/2012. Abdomen initially he had acute severe pancreatitis with Sirs and documented pancreatic necrosis. At initial presentation he was jaundiced however his liver tests improved with time it was felt that this was due to extrinsic compression of the bile duct from pancreatic edema. Etiology of his pancreatitis is unclear however he does have prior history of chronic EtOH use he had not been drinking any alcohol over the past 5 years up until the 3-4 months prior to becoming ill when he was told by his primary care to drink some brand EEG evening to help  control a chronic cough. He thinks he was drinking perhaps one shot each night. He is status post cholecystectomy.  At the time of discharge from the hospital he was unable to tolerate by mouth's and was discharged to a nursing facility for rehabilitation and continue tube feedings. CT of the abdomen and pelvis was done on 06/10/2012 and showed a decrease in intrahepatic ductal dilation. He did have a 5 x 5.5 cm low density collection in his retroperitoneum at the level of the pancreatic body as well as evidence for pancreatic body necrosis. 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  He was discharged with a Duragesic patch and oxycodone when necessary.  He was seen once back in the office on 06/19/2012 and at that time was doing significantly better and his pain was much improved. He had begun to have an appetite and had started sipping on clears without difficulty. He had not had any documented fevers but had been having some sweats. Plans were made for very gradual diet advancement to full liquids and conversion to nocturnal tube feedings. It was anticipated he may be able to be discharged from the nursing facility.  He was indeed discharged from the [facility] on 06/20/2012 and had home nursing help. His wife states that he had spiked a fever prior to discharge and he started on him on Levaquin on Friday the day of discharge. Unfortunately since then he has declined and has been complaining of progressive abdominal pain rated this point  he says his pain is just about as bad as it was when he was in the hospital quite sick. He has also been having intermittent sweats and his wife documented temp to 102.7 over the weekend . He has had persistent intermittent fevers. He is been unable to progress any on his diet but is able to take some clears and this taking water. He has not developed any diarrhea.  When seen in the office today he looks ill, was able to and delayed  but blood pressure is low at 84/50 and he is tachycardic at 110.   PAST MEDICAL HISTORY :  Past Medical History  Diagnosis Date  . Back pain   . Hypertension   . Asthma   . Thyroid disease   . High cholesterol   . Stroke   . Fibromyalgia   . Myocardial infarct   . GERD (gastroesophageal reflux disease)   . Pancreatitis   . Pneumonia   . Alcoholism    Past Surgical History  Procedure Laterality Date  . Back surgery    . Cholecystectomy  2009  . Finger surgery     Prior to Admission medications   Medication Sig Start Date End Date Taking? Authorizing Provider  aspirin 81 MG chewable tablet Chew 81 mg by mouth every morning. 06/13/12  Yes Tora Kindred York, PA-C  diclofenac sodium (VOLTAREN) 1 % GEL Apply 2 g topically 4 (four) times daily. 06/14/12  Yes Marianne L York, PA-C  DULoxetine (CYMBALTA) 60 MG capsule Take 60 mg by mouth every morning.    Yes Historical Provider, MD  fentaNYL (DURAGESIC - DOSED MCG/HR) 50 MCG/HR Place 1 patch (50 mcg total) onto the skin every 3 (three) days. 06/14/12  Yes Tora Kindred York, PA-C  HYDROcodone-acetaminophen (NORCO/VICODIN) 5-325 MG per tablet Take 1 tablet by mouth every 8 (eight) hours as needed for pain. 06/13/12  Yes Marianne L York, PA-C  levofloxacin (LEVAQUIN) 500 MG tablet Take 500 mg by mouth every morning.  06/21/12 07/01/12 Yes Historical Provider, MD  levothyroxine (SYNTHROID, LEVOTHROID) 100 MCG tablet Take 100 mcg by mouth every morning.    Yes Historical Provider, MD  lipase/protease/amylase (CREON-10/PANCREASE) 12000 UNITS CPEP Take 1 capsule by mouth daily before supper. 06/13/12  Yes Tora Kindred York, PA-C  metoprolol tartrate (LOPRESSOR) 25 MG tablet Take 1 tablet (25 mg total) by mouth 2 (two) times daily. 06/14/12  Yes Tora Kindred York, PA-C  Nutritional Supplements (FEEDING SUPPLEMENT, JEVITY 1.2 CAL,) LIQD Place 1,000 mLs into feeding tube every 12 (twelve) hours. 9pm-9am 06/14/12  Yes Marianne L York, PA-C  pantoprazole (PROTONIX) 40 MG  tablet Take 1 tablet (40 mg total) by mouth daily at 6 (six) AM. 06/14/12  Yes Tora Kindred York, PA-C  polyethylene glycol (MIRALAX / GLYCOLAX) packet Take 17 g by mouth every morning. 06/14/12  Yes Tora Kindred York, PA-C   Allergies  Allergen Reactions  . Amoxicillin   . Dilaudid (Hydromorphone Hcl) Swelling    FAMILY HISTORY:  Family History  Problem Relation Age of Onset  . Diabetes Mother   . Diabetes Brother   . Diabetes Brother   . Cancer Father     ? stomach   SOCIAL HISTORY:  reports that he has been passively smoking.  He has never used smokeless tobacco. He reports that he does not drink alcohol or use illicit drugs.    SUBJECTIVE:  C/o gen abd pain "please leave me alone, I'm finally getting some relief from my last shot"  VITAL SIGNS: Temp:  [97.6 F (36.4 C)-101 F (38.3 C)] 97.8 F (36.6 C) (04/16 1600) Pulse Rate:  [102-131] 109 (04/16 1600) Resp:  [19-27] 27 (04/16 1600) BP: (112-152)/(66-89) 122/88 mmHg (04/16 1600) SpO2:  [87 %-97 %] 87 % (04/16 1600) 02 Rx:  2lpm NP  HEMODYNAMICS:   VENTILATOR SETTINGS:   INTAKE / OUTPUT: Intake/Output     04/15 0701 - 04/16 0700 04/16 0701 - 04/17 0700   I.V. (mL/kg) 25 (0.3) 1100 (14.4)   NG/GT 1190 210   IV Piggyback 870 100   Total Intake(mL/kg) 2085 (27.3) 1410 (18.5)   Urine (mL/kg/hr) 575 400 (0.4)   Total Output 575 400   Net +1510 +1010        Urine Occurrence 1 x    Stool Occurrence 3 x 2 x     PHYSICAL EXAMINATION: General: chronically more than acutely ill appearing Neuro:  Alert, slt sedated, moves all 4 HEENT:  Mucosa dry, feeding tube in place Cardiovascular:  RRR ST on monitor around 110 Lungs:  Decreased bs bases Abdomen:  Mod distended and diffusely tender, no rebound Musculoskeletal:  No deform/ calf tenderness Skin:   No breakdown or rash  LABS:  Recent Labs Lab 06/25/12 1300 06/25/12 1315 06/26/12 0335  HGB 10.2*  --  10.2*  WBC 15.0*  --  15.2*  PLT 274  --  271  NA 131*  --   131*  K 3.7  --  3.7  CL 95*  --  95*  CO2 29  --  28  GLUCOSE 129*  --  198*  BUN 13  --  11  CREATININE 0.73  --  0.76  CALCIUM 8.6  --  8.4  AST 12  --  15  ALT 18  --  19  ALKPHOS 126*  --  124*  BILITOT 0.5  --  0.4  PROT 6.7  --  6.3  ALBUMIN 2.3*  --  2.1*  INR 1.18  --   --   LATICACIDVEN  --  1.2  --    No results found for this basename: GLUCAP,  in the last 168 hours     ASSESSMENT / PLAN:  PULMONARY A: Acute resp failure, 02 dep secondary to poor abd compliance/atx plus ? Early ali P:  02/ IS/ mobilize as tol  CARDIOVASCULAR A: Sinus tach from low grade sirs with soft bp - Better p IV fluids/ pain control  RENAL A:no issues  GASTROINTESTINAL A:  Necrotizing pancreatitis/ ? Infected pseudocyst rec Per GI/ GI surgery  A. Low albumin c/w protein calorie malnutrion/ ? Underlying liver dz rec  Per GI  HEMATOLOGIC A:  Mild anemia/ pos risk hem pancreatitis  rec PAS/ GI prophylaxis> dc/d LMWH 4/16      INFECTIOUS A:  Sirs/ no def sepsis - Primaxin per dashboard  ENDOCRINE A: Mild stress hyperglycemia - monitor A Hypothyroid - TSH 14 4/16 rec supplement  NEUROLOGIC A: no evidence delirium     CRITICAL CARE: The patient is critically ill with multiple organ systems failure and requires high complexity decision making for assessment and support, frequent evaluation and titration of therapies, application of advanced monitoring technologies and extensive interpretation of multiple databases. Critical Care Time devoted to patient care services described in this note is .    Pulmonary and Critical Care Medicine [facility] Pager: [phone]  06/26/2012, 6:45 PM

## 2012-06-26 NOTE — Progress Notes (Signed)
CARE MANAGEMENT NOTE 06/26/2012  Patient:  ESIQUIO, BOESEN   Account Number:  [id number]  Date Initiated:  06/26/2012  Documentation initiated by:  DAVIS,RHONDA  Subjective/Objective Assessment:   acute pancreatitis     Action/Plan:   home   Anticipated DC Date:  06/29/2012   Anticipated DC Plan:  HOME/SELF CARE  In-house referral  NA      DC Planning Services  CM consult      PAC Choice  NA   Choice offered to / List presented to:  NA   DME arranged  NA      DME agency  NA     HH arranged  NA      HH agency  [facility]   Status of service:  In process, will continue to follow Medicare Important Message given?  NA - LOS <3 / Initial given by admissions (If response is "NO", the following Medicare IM given date fields will be blank) Date Medicare IM given:   Date Additional Medicare IM given:    Discharge Disposition:    Per UR Regulation:  Reviewed for med. necessity/level of care/duration of stay  If discussed at Long Length of Stay Meetings, dates discussed:    Comments:  [id number] Earlene Plater, RN, BSN, CCM:  CHART REVIEWED AND UPDATED.  Next chart review due on [id number]. NO DISCHARGE NEEDS PRESENT AT THIS TIME. CASE MANAGEMENT [phone]

## 2012-06-26 NOTE — Progress Notes (Signed)
Patient seen, examined, and I agree with the above documentation, including the assessment and plan. Time spent today with patient, wife, and son (here from [place]).   Pt continues to have severe abd pain, remains tachycardic with fever.  Blood pressures have improved, now 150's systolic.  He remains tachy, though some better 100-110.  UOP to this point has been adequate.  Crt is stable. Getting IV morphine, will increase frequency to help with pain control Continue IV fluids  Blood cultures negative to date On imipenem.   TFs were held this morning by RN due pain.   Family made aware of stable, but serious condition with recurrent pancreatitis.  They still prefer transfer to [facility], but understand we will continue full level of care, including surgery here, if that becomes medically indicated and in his best interest. Per my discussion with [facility], the hospital is currently on diversion and not accepting any transfers. They are not sure when they will be able to transfer patient's in and it could be hours to multiple days.  He remains on the list to be transferred to [facility], Surgical service, Dr. Harvel Ricks Until then, continue antibiotics with imipenem, pain control, IV fluids, close monitoring of urine output and renal function.  I have spoken to interventional radiology and requested CT-guided aspiration of pancreatic phlegmon for Gram stain and culture to help guide any surgical decision regarding necrosectomy.   I also requested a critical care medicine consult, I spoke to Dr. Danise Mina, and they will see the patient.  Appreciate their input.

## 2012-06-26 NOTE — Progress Notes (Signed)
[facility] Consult note                                                                                Patient Demographics  Justin [name], is a 57 y.o. male, DOB - 1956-02-25, ZOX:[id number], WJX:[id number]  Admit date - 06/25/2012  Admitting Physician No admitting provider for patient encounter.  Outpatient Primary MD for the patient is VYAS,DHRUV B., MD  LOS - 1   No chief complaint on file.       Assessment & Plan    1. Epigastric abdominal pain hypotension- patient directly admitted from GI office with suspected infected pancreatic pseudocyst and phlegmon with SIRS- he is getting to feeds via NG tube for GI, continue IV antibiotics, IV fluids to be continued rate has been adjusted, blood pressure is better. I will defer further management i.e. consultation of surgery, drainage of the pseudocyst etc. to the primary team GI.    2. History of hypothyroidism. He is n.p.o. Since TSH elevated, increased IV synthroid dose, repeat TSH along with free T3, T4.    3. History of dyslipidemia with elevated triglycerides. Once taking by mouth this will be addressed by GI.     4. CAD. No acute issues. He is pain-free, supportive care for now.     5. Hyponatremia - ine sodium is less than 10 however urine osmolality is greater than serum osmolality, clinically appears dehydrated, continue normal saline monitor BMP.      DVT Prophylaxis  Lovenox    Lab Results  Component Value Date   PLT 271 06/26/2012    Medications  Scheduled Meds: . enoxaparin (LOVENOX) injection  40 mg Subcutaneous Q24H  . imipenem-cilastatin  500 mg Intravenous Q6H  . levothyroxine  75 mcg Intravenous QAC breakfast  . pantoprazole (PROTONIX) IV  40 mg Intravenous Q24H  . sodium chloride  3 mL Intravenous Q12H   Continuous Infusions: . sodium chloride 100 mL/hr (06/26/12 0645)   PRN Meds:.acetaminophen (TYLENOL) oral liquid 160 mg/5 mL, morphine injection, nitroGLYCERIN, ondansetron (ZOFRAN)  IV, ondansetron  Antibiotics    Anti-infectives   Start     Dose/Rate Route Frequency Ordered Stop   06/25/12 1330  imipenem-cilastatin (PRIMAXIN) 500 mg in sodium chloride 0.9 % 100 mL IVPB     500 mg 200 mL/hr over 30 Minutes Intravenous 4 times per day 06/25/12 1212         Time Spent in minutes   35  Matthew Pais K M.D on 06/26/2012 at 9:34 AM  Between 7am to 7pm - Pager - [phone]  After 7pm go to [email or web address] - password [email or web address]  And look for the night coverage person covering for me after hours  [facility] Office  [phone]    Subjective:   Justin [name] today has, No headache, No chest pain, improved but +ve abdominal pain - No Nausea, No new weakness tingling or numbness, No Cough - SOB.    Objective:   Filed Vitals:   06/26/12 0350 06/26/12 0400 06/26/12 0545 06/26/12 0800  BP:  120/66    Pulse: 124 125    Temp: 100.7 F (38.2 C)  98.5 F (36.9 C) 97.6 F (36.4 C)  TempSrc: Axillary  Oral Oral  Resp: 19 24    Height:      Weight:      SpO2: 92% 92%      Wt Readings from Last 3 Encounters:  06/25/12 76.4 kg (168 lb 6.9 oz)  06/25/12 77.282 kg (170 lb 6 oz)  06/19/12 74.571 kg (164 lb 6.4 oz)     Intake/Output Summary (Last 24 hours) at 06/26/12 0934 Last data filed at 06/26/12 0600  Gross per 24 hour  Intake   1990 ml  Output    575 ml  Net   1415 ml    Exam Awake Alert, Oriented X 3, No new F.N deficits, Normal affect [place].AT,PERRAL Supple Neck,No JVD, No cervical lymphadenopathy appriciated.  Symmetrical Chest wall movement, Good air movement bilaterally, CTAB RRR,No Gallops,Rubs or new Murmurs, No Parasternal Heave +ve B.Sounds, Abd is mildly rigid, diffusely tender, +ve guarding No Cyanosis, Clubbing or edema, No new Rash or bruise      Data Review   Micro Results Recent Results (from the past 240 hour(s))  MRSA PCR SCREENING     Status: None   Collection Time    06/25/12 12:00 PM      Result Value Range  Status   MRSA by PCR NEGATIVE  NEGATIVE Final   Comment:            The GeneXpert MRSA Assay (FDA     approved for NASAL specimens     only), is one component of a     comprehensive MRSA colonization     surveillance program. It is not     intended to diagnose MRSA     infection nor to guide or     monitor treatment for     MRSA infections.  CULTURE, BLOOD (ROUTINE X 2)     Status: None   Collection Time    06/25/12  1:00 PM      Result Value Range Status   Specimen Description BLOOD LEFT HAND   Final   Special Requests BOTTLES DRAWN AEROBIC AND ANAEROBIC 6CC   Final   Culture  Setup Time 06/25/2012 21:58   Final   Culture     Final   Value:        BLOOD CULTURE RECEIVED NO GROWTH TO DATE CULTURE WILL BE HELD FOR 5 DAYS BEFORE ISSUING A FINAL NEGATIVE REPORT   Report Status PENDING   Incomplete  CULTURE, BLOOD (ROUTINE X 2)     Status: None   Collection Time    06/25/12  1:15 PM      Result Value Range Status   Specimen Description BLOOD LEFT ARM   Final   Special Requests BOTTLES DRAWN AEROBIC AND ANAEROBIC [facility]   Final   Culture  Setup Time 06/25/2012 21:58   Final   Culture     Final   Value:        BLOOD CULTURE RECEIVED NO GROWTH TO DATE CULTURE WILL BE HELD FOR 5 DAYS BEFORE ISSUING A FINAL NEGATIVE REPORT   Report Status PENDING   Incomplete    Radiology Reports     Ct Abdomen Pelvis W Contrast  06/10/2012  *RADIOLOGY REPORT*  Clinical Data: Pancreatitis.  The upper quadrant pain.  Nausea vomiting.  CT ABDOMEN AND PELVIS WITH CONTRAST  Technique:  Multidetector CT imaging of the abdomen and pelvis was performed following the standard protocol during bolus administration of intravenous contrast.  Contrast: 80mL OMNIPAQUE IOHEXOL 300 MG/ML  SOLN  Comparison: 318/21/14  Findings: imaging through the lung bases shows bibasilar collapse / consolidation with small left pleural effusion.  Tiny low-density lesions in the liver are stable.  Intrahepatic biliary duct dilatation has  decreased in the interval.  Spleen is unremarkable.  Feeding tube tip is transpyloric, in the proximal jejunum.  5.0 x 5.7 cm low density collection identified in the retroperitoneum at the level of the pancreatic body.  There is no enhancing pancreatic parenchyma in the body the pancreas, consistent with pancreatic necrosis and phlegmonous change within the retroperitoneal space.  1.9 x 5.1 cm rim enhancing fluid collection anterior to the pancreatic tail is compatible with evolving pseudocyst.  There is fluid in the root of the small bowel mesentery.  These changes generate significant mass effect on the superior mesenteric vein and splenic vein, near the portosplenic confluence.  The splenic vein is only a string sign as it enters the confluence.  Multiple tiny stones are seen in the kidneys bilaterally without urinary obstruction.  No abdominal aortic aneurysm.  No retroperitoneal lymphadenopathy.  Imaging through the pelvis shows no free intraperitoneal fluid. There is no pelvic sidewall lymphadenopathy.  Diverticular changes are seen in the colon without evidence for diverticulitis. Terminal ileum and appendix are normal.  IMPRESSION: Evidence of pancreatic necrosis and phegmon within the pancreatic body and probable evolving pseudocyst in the central mesentery. Mass effect from the phlegmon generates substantial attenuation of the superior mesenteric and splenic veins as they track into the portosplenic confluence.  The portal vein remains patent at this time.   Original Report Authenticated By: Kennith Center, M.D.      CBC  Recent Labs Lab 06/19/12 1434 06/25/12 1300 06/26/12 0335  WBC 7.5 15.0* 15.2*  HGB 12.5* 10.2* 10.2*  HCT 38.1* 30.0* 30.2*  PLT 405.0* 274 271  MCV 87.1 82.4 82.7  MCH  --  28.0 27.9  MCHC 32.8 34.0 33.8  RDW 14.6 14.3 14.6  LYMPHSABS 1.6  --   --   MONOABS 0.5  --   --   EOSABS 0.0  --   --   BASOSABS 0.0  --   --     Chemistries   Recent Labs Lab  06/19/12 1434 06/25/12 1300 06/26/12 0335  NA 139 131* 131*  K 3.9 3.7 3.7  CL 100 95* 95*  CO2 32 29 28  GLUCOSE 98 129* 198*  BUN 12 13 11   CREATININE 0.9 0.73 0.76  CALCIUM 9.0 8.6 8.4  AST 21 12 15   ALT 32 18 19  ALKPHOS 138* 126* 124*  BILITOT 0.8 0.5 0.4   ------------------------------------------------------------------------------------------------------------------ estimated creatinine clearance is 95.2 ml/min (by C-G formula based on Cr of 0.76). ------------------------------------------------------------------------------------------------------------------ No results found for this basename: HGBA1C,  in the last 72 hours ------------------------------------------------------------------------------------------------------------------ No results found for this basename: CHOL, HDL, LDLCALC, TRIG, CHOLHDL, LDLDIRECT,  in the last 72 hours ------------------------------------------------------------------------------------------------------------------  Recent Labs  06/25/12 1300  TSH 24.587*   ------------------------------------------------------------------------------------------------------------------ No results found for this basename: VITAMINB12, FOLATE, FERRITIN, TIBC, IRON, RETICCTPCT,  in the last 72 hours  Coagulation profile  Recent Labs Lab 06/25/12 1300  INR 1.18    No results found for this basename: DDIMER,  in the last 72 hours  Cardiac Enzymes No results found for this basename: CK, CKMB, TROPONINI, MYOGLOBIN,  in the last 168 hours ------------------------------------------------------------------------------------------------------------------ No components found with this basename: POCBNP,

## 2012-06-27 ENCOUNTER — Encounter (HOSPITAL_COMMUNITY): Payer: Self-pay | Admitting: Radiology

## 2012-06-27 ENCOUNTER — Inpatient Hospital Stay (HOSPITAL_COMMUNITY): Payer: Medicare Other

## 2012-06-27 ENCOUNTER — Ambulatory Visit (HOSPITAL_COMMUNITY): Payer: Medicare Other

## 2012-06-27 DIAGNOSIS — R6510 Systemic inflammatory response syndrome (SIRS) of non-infectious origin without acute organ dysfunction: Secondary | ICD-10-CM | POA: Diagnosis present

## 2012-06-27 LAB — CBC
HCT: 27.1 % — ABNORMAL LOW (ref 39.0–52.0)
Hemoglobin: 9.2 g/dL — ABNORMAL LOW (ref 13.0–17.0)
MCHC: 33.9 g/dL (ref 30.0–36.0)
RBC: 3.27 MIL/uL — ABNORMAL LOW (ref 4.22–5.81)
WBC: 14.9 10*3/uL — ABNORMAL HIGH (ref 4.0–10.5)

## 2012-06-27 LAB — COMPREHENSIVE METABOLIC PANEL
ALT: 22 U/L (ref 0–53)
Alkaline Phosphatase: 108 U/L (ref 39–117)
BUN: 10 mg/dL (ref 6–23)
CO2: 26 mEq/L (ref 19–32)
Chloride: 98 mEq/L (ref 96–112)
GFR calc Af Amer: >90 mL/min (ref >90)
GFR calc non Af Amer: >90 mL/min (ref >90)
Glucose, Bld: 103 mg/dL — ABNORMAL HIGH (ref 70–99)
Potassium: 3.5 mEq/L (ref 3.5–5.1)
Sodium: 132 mEq/L — ABNORMAL LOW (ref 135–145)
Total Bilirubin: 0.4 mg/dL (ref 0.3–1.2)

## 2012-06-27 LAB — GLUCOSE, CAPILLARY
Glucose-Capillary: 113 mg/dL — ABNORMAL HIGH (ref 70–99)
Glucose-Capillary: 105 mg/dL — ABNORMAL HIGH (ref 70–99)

## 2012-06-27 LAB — TROPONIN I: Troponin I: <0.3 ng/mL (ref <0.30)

## 2012-06-27 MED ORDER — INSULIN ASPART 100 UNIT/ML ~~LOC~~ SOLN: 0.0000 [IU] | SUBCUTANEOUS | Status: DC

## 2012-06-27 MED ORDER — FENTANYL CITRATE 0.05 MG/ML IJ SOLN
INTRAMUSCULAR | Status: AC | PRN
  Administered 2012-06-27: 50 ug via INTRAVENOUS
  Administered 2012-06-27: 25 ug via INTRAVENOUS

## 2012-06-27 MED ORDER — MIDAZOLAM HCL 2 MG/2ML IJ SOLN
INTRAMUSCULAR | Status: AC | PRN
  Administered 2012-06-27: 1 mg via INTRAVENOUS
  Administered 2012-06-27: 0.5 mg via INTRAVENOUS

## 2012-06-27 NOTE — Progress Notes (Signed)
Chest pain after an episode of cough;   Cardiac enzymes and EKG, CXR ordered

## 2012-06-27 NOTE — Progress Notes (Signed)
PULMONARY  / CRITICAL CARE MEDICINE  Name: Justin [name] MRN: [id number] DOB: 1955-09-15    ADMISSION DATE:  06/25/2012 CONSULTATION DATE:  4/16  REFERRING MD :  Chestine Spore, [place] [facility] PRIMARY SERVICE:  GI  CHIEF COMPLAINT:  abd pain  BRIEF PATIENT DESCRIPTION:  57 yo male with recent admit with necrotizing pancreatitis ? etoh relatated complicated by pseudocyst re-admitted  From [facility] 4/15 with recurrent abd pain and tachcardia with low bp that responded initially to IV fluids but PCCM service asked to review his care as is felt to be at high risk of sirs/higher level of care  SIGNIFICANT EVENTS / STUDIES:  4/15 CT abd > acute pancreatitis with necrosis, increased size of pancreatic pseudocyst, probable splenic vein thrombosis 4/17 IR sampling of pseudocyst >> 70 ml purulent fluid  LINES / TUBES: PIV NG tube  CULTURES: UC 4/15 > negative MRSA 4/15 > negative BC x2 4/15 > Pseudocyst 4/17 >  ANTIBIOTICS: Primaxin 4/15 >>>  SUBJECTIVE:  Just returned from IR.  Sleepy after getting versed for procedure.  RN reports pt had normal mental status prior to procedure.    VITAL SIGNS: Temp:  [97.8 F (36.6 C)-98.8 F (37.1 C)] 98.6 F (37 C) (04/17 0800) Pulse Rate:  [106-131] 115 (04/17 0943) Resp:  [14-28] 16 (04/17 0943) BP: (99-152)/(54-89) 99/58 mmHg (04/17 0943) SpO2:  [87 %-100 %] 96 % (04/17 0943) 02 Rx:  4lpm [place] INTAKE / OUTPUT: Intake/Output     04/16 0701 - 04/17 0700 04/17 0701 - 04/18 0700   I.V. (mL/kg) 2400 (31.4) 200 (2.6)   NG/GT 210    IV Piggyback 400    Total Intake(mL/kg) 3010 (39.4) 200 (2.6)   Urine (mL/kg/hr) 1000 (0.5) 250 (1.1)   Total Output 1000 250   Net +2010 -50        Stool Occurrence 3 x      PHYSICAL EXAMINATION: General: No distress Neuro: Sleepy, wakes up easily, follows commands HEENT: NG tube in place Cardiovascular:  Regular, tachycardic Lungs:  Decreased breath sounds, no wheeze Abdomen: distended, soft, mild mid-epigastric  tenderness Musculoskeletal:  No deform/ calf tenderness Skin:   No breakdown or rash  LABS:  Recent Labs Lab 06/25/12 1300 06/25/12 1315 06/26/12 0335 06/27/12 0335  HGB 10.2*  --  10.2* 9.2*  WBC 15.0*  --  15.2* 14.9*  PLT 274  --  271 295  NA 131*  --  131* 132*  K 3.7  --  3.7 3.5  CL 95*  --  95* 98  CO2 29  --  28 26  GLUCOSE 129*  --  198* 103*  BUN 13  --  11 10  CREATININE 0.73  --  0.76 0.74  CALCIUM 8.6  --  8.4 8.2*  AST 12  --  15 19  ALT 18  --  19 22  ALKPHOS 126*  --  124* 108  BILITOT 0.5  --  0.4 0.4  PROT 6.7  --  6.3 5.5*  ALBUMIN 2.3*  --  2.1* 1.7*  INR 1.18  --   --   --   LATICACIDVEN  --  1.2  --   --    No results found for this basename: GLUCAP,  in the last 168 hours  Imaging: Ct Abdomen Pelvis W Contrast  06/25/2012  *RADIOLOGY REPORT*  Clinical Data: Follow-up necrotizing pancreatitis. Fever.  Nausea and vomiting.  Upper abdominal pain.  CT ABDOMEN AND PELVIS WITH CONTRAST  Technique:  Multidetector CT imaging of the abdomen and pelvis was performed following the standard protocol during bolus administration of intravenous contrast.  Contrast: OMNIPAQUE IOHEXOL 300 MG/ML  SOLN  Comparison: 06/10/2012  Findings: Increase in bilateral lower lobe atelectasis noted.  Tiny left pleural effusion shows no significant change.  A feeding tube remains in place with the tip in the proximal jejunum.  Acute pancreatitis is again demonstrated, with necrosis involving the pancreatic neck and body.  Increased size of a large peripancreatic fluid collection is seen in the central abdomen, posterior to the stomach and extending inferiorly in the central small bowel mesentery.  This collection now measures 8 x 12 cm compared to 5 x 6 cm previously, consistent with a large pseudocyst.  There are several small internal air bubbles seen within this collection, and infection cannot be excluded.  A smaller fluid collection is seen in the uncinate process of the  pancreas which now measures 3.6 x 4.6 cm compared to 2.1 x 2.3 cm previously.  This also consistent with an enlarging pseudocyst. Adjacent reactive thickening of the duodenum and posterior wall of the stomach is again demonstrated.  The splenic vein is not well visualized, suspicious for splenic vein thrombosis.  There is no evidence of portal venous thrombosis.  No evidence of splenomegaly.  Prior cholecystectomy is again noted.  Mild dilatation of central intrahepatic bile duct shows no significant change.  No liver masses or abscesses are identified.  The adrenal glands are normal appearance.  Tiny nonobstructing bilateral intrarenal calculi are again demonstrated however there is no evidence of renal mass or hydronephrosis.  A small amount of free fluid is seen in the central pelvis, but no discrete pelvic pseudocyst identified.  No pelvic soft tissue masses are identified.  Diverticulosis is again seen involving the descending and sigmoid portions of the colon, however there is no evidence of diverticulitis.  No evidence of bowel obstruction.  IMPRESSION:  1. Acute pancreatitis, with pancreatic necrosis again seen involving the pancreatic neck and body.  Increased size of developing pancreatic pseudocysts seen.  Internal air bubbles are seen in the largest fluid collection, and superimposed infection cannot be excluded by imaging. 2.  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  No evidence of portal vein thrombosis. 3.  Increased bibasilar atelectasis.   Original Report Authenticated By: Myles Rosenthal, M.D.     ASSESSMENT / PLAN:  PULMONARY A: Acute hypoxic respiratory failure >> likely ATX. P:   -supplemental oxygen to keep SpO2 > 92% -bronchial hygiene -f/u CXR 4/18  CARDIOVASCULAR A: SIRS 2nd to pancreatitis. Hx of CAD. P: -continue IV fluids -monitor hemodynamics in ICU  RENAL A: No acute issues. P: -monitor renal fx, urine outpt, electrolytes  GASTROINTESTINAL A:  Necrotizing pancreatitis with  infected pseudocyst. Nutrition. P: -Per GI, and CCS >> plan for transfer to [facility] when bed available; may need surgical intervention prior to transfer if clinical status gets worse -may need TNA if unable to start tube feeds soon -Protonix for SUP  HEMATOLOGIC A:  Anemia of critical illness. P: -f/u CBC -SCD for DVT prevention  INFECTIOUS A: Acute pancreatitis with necrosis and infected pseudocyst. P: - Continue primaxin   ENDOCRINE A: Hx of hypothyroidism. Hyperglycemia. P: -Continue IV synthroid -added SSI 4/17  NEUROLOGIC A:  Pain control. P: -PRN morphine   CC time 35 minutes.  Coralyn Helling, MD [facility] Pulmonary/Critical Care 06/27/2012, 10:16 AM Pager:  [phone] After 3pm call: [phone]

## 2012-06-27 NOTE — Progress Notes (Signed)
Critical care on the case along witjh CCS, Hospitalist will sign off.

## 2012-06-27 NOTE — Progress Notes (Signed)
Patient alert and orientedx4.  Patient complained of chest pain, 3/10, throbbing and not radiating at 1855pm.  Vital signs were stable.  2L nasal cannual for comfort, patient stated he was not experiencing any shortness of breath but had noticed an increase in coughing throughout the day.  One sublingual nitroglycerin tablet was administered.  Patient stated relief of the chest pain to a 0/10 after nitro administration.  Vital signs remained stable with nitro administration.  Dr. Frederico Hamman was notified of chest pain and interventions.  New orders were given.  A stat EKG was performed as ordered.  Kinnie Feil, RN

## 2012-06-27 NOTE — H&P (Signed)
HPI: Justin [name] is an 57 y.o. male with necrotizing pancreatitis and pseudocyst. GI and Surgery following pt. Concern for infection of pseudocyst and GI requests IR attempt aspiration of collection to obtain sample for culture. Chart, PMHx, and meds reviewed. Lovenox given last night at 630pm  Past Medical History:  Past Medical History  Diagnosis Date  . Back pain   . Hypertension   . Asthma   . Thyroid disease   . High cholesterol   . Stroke   . Fibromyalgia   . Myocardial infarct   . GERD (gastroesophageal reflux disease)   . Pancreatitis   . Pneumonia   . Alcoholism     Past Surgical History:  Past Surgical History  Procedure Laterality Date  . Back surgery    . Cholecystectomy  2009  . Finger surgery      Family History:  Family History  Problem Relation Age of Onset  . Diabetes Mother   . Diabetes Brother   . Diabetes Brother   . Cancer Father     ? stomach    Social History:  reports that he has been passively smoking.  He has never used smokeless tobacco. He reports that he does not drink alcohol or use illicit drugs.  Allergies:  Allergies  Allergen Reactions  . Amoxicillin   . Dilaudid (Hydromorphone Hcl) Swelling    Medications: Medications Prior to Admission  Medication Sig Dispense Refill  . aspirin 81 MG chewable tablet Chew 81 mg by mouth every morning.      . diclofenac sodium (VOLTAREN) 1 % GEL Apply 2 g topically 4 (four) times daily.  1 Tube  0  . DULoxetine (CYMBALTA) 60 MG capsule Take 60 mg by mouth every morning.       . fentaNYL (DURAGESIC - DOSED MCG/HR) 50 MCG/HR Place 1 patch (50 mcg total) onto the skin every 3 (three) days.  5 patch  0  . HYDROcodone-acetaminophen (NORCO/VICODIN) 5-325 MG per tablet Take 1 tablet by mouth every 8 (eight) hours as needed for pain.  60 tablet  0  . levofloxacin (LEVAQUIN) 500 MG tablet Take 500 mg by mouth every morning.       Marland Kitchen levothyroxine (SYNTHROID, LEVOTHROID) 100 MCG tablet Take 100  mcg by mouth every morning.       . lipase/protease/amylase (CREON-10/PANCREASE) 12000 UNITS CPEP Take 1 capsule by mouth daily before supper.      . metoprolol tartrate (LOPRESSOR) 25 MG tablet Take 1 tablet (25 mg total) by mouth 2 (two) times daily.  60 tablet  0  . Nutritional Supplements (FEEDING SUPPLEMENT, JEVITY 1.2 CAL,) LIQD Place 1,000 mLs into feeding tube every 12 (twelve) hours. 9pm-9am      . pantoprazole (PROTONIX) 40 MG tablet Take 1 tablet (40 mg total) by mouth daily at 6 (six) AM.  30 tablet  0  . polyethylene glycol (MIRALAX / GLYCOLAX) packet Take 17 g by mouth every morning.      . [DISCONTINUED] aspirin 81 MG chewable tablet Chew 1 tablet (81 mg total) by mouth daily.      . [DISCONTINUED] lipase/protease/amylase (CREON-10/PANCREASE) 12000 UNITS CPEP Take 2 capsules by mouth 3 (three) times daily before meals.  270 capsule  0  . [DISCONTINUED] Nutritional Supplements (FEEDING SUPPLEMENT, JEVITY 1.2 CAL,) LIQD Place 1,000 mLs into feeding tube continuous.  1000 mL  0  . [DISCONTINUED] polyethylene glycol (MIRALAX / GLYCOLAX) packet Take 17 g by mouth daily.  14 each  0  . [  DISCONTINUED] oxyCODONE (OXY IR/ROXICODONE) 5 MG immediate release tablet Take 1-2 tablets (5-10 mg total) by mouth every 4 (four) hours as needed.  30 tablet  0    Please HPI for pertinent positives, otherwise complete 10 system ROS negative.  Physical Exam: Blood pressure 105/74, pulse 110, temperature 98.6 F (37 C), temperature source Oral, resp. rate 18, height 5\' 7"  (1.702 m), weight 168 lb 6.9 oz (76.4 kg), SpO2 97.00%. Body mass index is 26.37 kg/(m^2).   General Appearance:  Alert, cooperative, no distress, appears stated age  Head:  Normocephalic, without obvious abnormality, atraumatic  ENT: Unremarkable, NGT intact  Neck: Supple, symmetrical, trachea midline,  Lungs:   Clear to auscultation bilaterally, no w/r/r, respirations unlabored without use of accessory muscles.  Chest Wall:  No  tenderness or deformity  Heart:  Regular rate and rhythm, S1, S2 normal, no murmur, rub or gallop.   Abdomen:   Soft, upper abd fullness with mild tenderness  Neurologic: Normal affect, no gross deficits.   Results for orders placed during the hospital encounter of 06/25/12 (from the past 48 hour(s))  MRSA PCR SCREENING     Status: None   Collection Time    06/25/12 12:00 PM      Result Value Range   MRSA by PCR NEGATIVE  NEGATIVE   Comment:            The GeneXpert MRSA Assay (FDA     approved for NASAL specimens     only), is one component of a     comprehensive MRSA colonization     surveillance program. It is not     intended to diagnose MRSA     infection nor to guide or     monitor treatment for     MRSA infections.  PROTIME-INR     Status: None   Collection Time    06/25/12  1:00 PM      Result Value Range   Prothrombin Time 14.8  11.6 - 15.2 seconds   INR 1.18  0.00 - 1.49  LIPASE, BLOOD     Status: Abnormal   Collection Time    06/25/12  1:00 PM      Result Value Range   Lipase 90 (*) 11 - 59 U/L  CULTURE, BLOOD (ROUTINE X 2)     Status: None   Collection Time    06/25/12  1:00 PM      Result Value Range   Specimen Description BLOOD LEFT HAND     Special Requests BOTTLES DRAWN AEROBIC AND ANAEROBIC 6CC     Culture  Setup Time 06/25/2012 21:58     Culture       Value:        BLOOD CULTURE RECEIVED NO GROWTH TO DATE CULTURE WILL BE HELD FOR 5 DAYS BEFORE ISSUING A FINAL NEGATIVE REPORT   Report Status PENDING    CBC     Status: Abnormal   Collection Time    06/25/12  1:00 PM      Result Value Range   WBC 15.0 (*) 4.0 - 10.5 K/uL   RBC 3.64 (*) 4.22 - 5.81 MIL/uL   Hemoglobin 10.2 (*) 13.0 - 17.0 g/dL   HCT 82.9 (*) 56.2 - 13.0 %   MCV 82.4  78.0 - 100.0 fL   MCH 28.0  26.0 - 34.0 pg   MCHC 34.0  30.0 - 36.0 g/dL   RDW 86.5  78.4 - 69.6 %   Platelets 274  150 - 400 K/uL  COMPREHENSIVE METABOLIC PANEL     Status: Abnormal   Collection Time    06/25/12   1:00 PM      Result Value Range   Sodium 131 (*) 135 - 145 mEq/L   Potassium 3.7  3.5 - 5.1 mEq/L   Chloride 95 (*) 96 - 112 mEq/L   CO2 29  19 - 32 mEq/L   Glucose, Bld 129 (*) 70 - 99 mg/dL   BUN 13  6 - 23 mg/dL   Creatinine, Ser 1.61  0.50 - 1.35 mg/dL   Calcium 8.6  8.4 - 09.6 mg/dL   Total Protein 6.7  6.0 - 8.3 g/dL   Albumin 2.3 (*) 3.5 - 5.2 g/dL   AST 12  0 - 37 U/L   ALT 18  0 - 53 U/L   Alkaline Phosphatase 126 (*) 39 - 117 U/L   Total Bilirubin 0.5  0.3 - 1.2 mg/dL   GFR calc non Af Amer >90  >90 mL/min   GFR calc Af Amer >90  >90 mL/min   Comment:            The eGFR has been calculated     using the CKD EPI equation.     This calculation has not been     validated in all clinical     situations.     eGFR's persistently     <90 mL/min signify     possible Chronic Kidney Disease.  TSH     Status: Abnormal   Collection Time    06/25/12  1:00 PM      Result Value Range   TSH 24.587 (*) 0.350 - 4.500 uIU/mL  CULTURE, BLOOD (ROUTINE X 2)     Status: None   Collection Time    06/25/12  1:15 PM      Result Value Range   Specimen Description BLOOD LEFT ARM     Special Requests BOTTLES DRAWN AEROBIC AND ANAEROBIC 6CC     Culture  Setup Time 06/25/2012 21:58     Culture       Value:        BLOOD CULTURE RECEIVED NO GROWTH TO DATE CULTURE WILL BE HELD FOR 5 DAYS BEFORE ISSUING A FINAL NEGATIVE REPORT   Report Status PENDING    LACTIC ACID, PLASMA     Status: None   Collection Time    06/25/12  1:15 PM      Result Value Range   Lactic Acid, Venous 1.2  0.5 - 2.2 mmol/L  OSMOLALITY     Status: None   Collection Time    06/25/12  2:39 PM      Result Value Range   Osmolality 276  275 - 300 mOsm/kg  OSMOLALITY, URINE     Status: None   Collection Time    06/25/12  3:49 PM      Result Value Range   Osmolality, Ur 497  390 - 1090 mOsm/kg  SODIUM, URINE, RANDOM     Status: None   Collection Time    06/25/12  3:49 PM      Result Value Range   Sodium, Ur <10      Comment: REPEATED TO VERIFY  URINALYSIS, ROUTINE W REFLEX MICROSCOPIC     Status: Abnormal   Collection Time    06/25/12  3:49 PM      Result Value Range   Color, Urine YELLOW  YELLOW   APPearance CLEAR  CLEAR  Specific Gravity, Urine 1.025  1.005 - 1.030   pH 6.0  5.0 - 8.0   Glucose, UA NEGATIVE  NEGATIVE mg/dL   Hgb urine dipstick NEGATIVE  NEGATIVE   Bilirubin Urine NEGATIVE  NEGATIVE   Ketones, ur NEGATIVE  NEGATIVE mg/dL   Protein, ur 30 (*) NEGATIVE mg/dL   Urobilinogen, UA 1.0  0.0 - 1.0 mg/dL   Nitrite NEGATIVE  NEGATIVE   Leukocytes, UA NEGATIVE  NEGATIVE  URINE CULTURE     Status: None   Collection Time    06/25/12  3:49 PM      Result Value Range   Specimen Description URINE, CLEAN CATCH     Special Requests NONE     Culture  Setup Time 06/25/2012 22:00     Colony Count NO GROWTH     Culture NO GROWTH     Report Status 06/26/2012 FINAL    URINE MICROSCOPIC-ADD ON     Status: Abnormal   Collection Time    06/25/12  3:49 PM      Result Value Range   Squamous Epithelial / LPF FEW (*) RARE   WBC, UA 7-10  <3 WBC/hpf   Bacteria, UA FEW (*) RARE  CBC     Status: Abnormal   Collection Time    06/26/12  3:35 AM      Result Value Range   WBC 15.2 (*) 4.0 - 10.5 K/uL   RBC 3.65 (*) 4.22 - 5.81 MIL/uL   Hemoglobin 10.2 (*) 13.0 - 17.0 g/dL   HCT 16.1 (*) 09.6 - 04.5 %   MCV 82.7  78.0 - 100.0 fL   MCH 27.9  26.0 - 34.0 pg   MCHC 33.8  30.0 - 36.0 g/dL   RDW 40.9  81.1 - 91.4 %   Platelets 271  150 - 400 K/uL  COMPREHENSIVE METABOLIC PANEL     Status: Abnormal   Collection Time    06/26/12  3:35 AM      Result Value Range   Sodium 131 (*) 135 - 145 mEq/L   Potassium 3.7  3.5 - 5.1 mEq/L   Chloride 95 (*) 96 - 112 mEq/L   CO2 28  19 - 32 mEq/L   Glucose, Bld 198 (*) 70 - 99 mg/dL   BUN 11  6 - 23 mg/dL   Creatinine, Ser 7.82  0.50 - 1.35 mg/dL   Calcium 8.4  8.4 - 95.6 mg/dL   Total Protein 6.3  6.0 - 8.3 g/dL   Albumin 2.1 (*) 3.5 - 5.2 g/dL   AST 15  0 -  37 U/L   ALT 19  0 - 53 U/L   Alkaline Phosphatase 124 (*) 39 - 117 U/L   Total Bilirubin 0.4  0.3 - 1.2 mg/dL   GFR calc non Af Amer >90  >90 mL/min   GFR calc Af Amer >90  >90 mL/min   Comment:            The eGFR has been calculated     using the CKD EPI equation.     This calculation has not been     validated in all clinical     situations.     eGFR's persistently     <90 mL/min signify     possible Chronic Kidney Disease.  T3, FREE     Status: Abnormal   Collection Time    06/26/12  8:20 AM      Result Value Range   T3,  Free 1.4 (*) 2.3 - 4.2 pg/mL  T4     Status: None   Collection Time    06/26/12  8:20 AM      Result Value Range   T4, Total 5.9  5.0 - 12.5 ug/dL  TSH     Status: Abnormal   Collection Time    06/26/12  8:20 AM      Result Value Range   TSH 14.064 (*) 0.350 - 4.500 uIU/mL  CBC     Status: Abnormal   Collection Time    06/27/12  3:35 AM      Result Value Range   WBC 14.9 (*) 4.0 - 10.5 K/uL   RBC 3.27 (*) 4.22 - 5.81 MIL/uL   Hemoglobin 9.2 (*) 13.0 - 17.0 g/dL   HCT 16.1 (*) 09.6 - 04.5 %   MCV 82.9  78.0 - 100.0 fL   MCH 28.1  26.0 - 34.0 pg   MCHC 33.9  30.0 - 36.0 g/dL   RDW 40.9  81.1 - 91.4 %   Platelets 295  150 - 400 K/uL  COMPREHENSIVE METABOLIC PANEL     Status: Abnormal   Collection Time    06/27/12  3:35 AM      Result Value Range   Sodium 132 (*) 135 - 145 mEq/L   Potassium 3.5  3.5 - 5.1 mEq/L   Chloride 98  96 - 112 mEq/L   CO2 26  19 - 32 mEq/L   Glucose, Bld 103 (*) 70 - 99 mg/dL   BUN 10  6 - 23 mg/dL   Creatinine, Ser 7.82  0.50 - 1.35 mg/dL   Calcium 8.2 (*) 8.4 - 10.5 mg/dL   Total Protein 5.5 (*) 6.0 - 8.3 g/dL   Albumin 1.7 (*) 3.5 - 5.2 g/dL   AST 19  0 - 37 U/L   ALT 22  0 - 53 U/L   Alkaline Phosphatase 108  39 - 117 U/L   Total Bilirubin 0.4  0.3 - 1.2 mg/dL   GFR calc non Af Amer >90  >90 mL/min   GFR calc Af Amer >90  >90 mL/min   Comment:            The eGFR has been calculated     using the CKD EPI  equation.     This calculation has not been     validated in all clinical     situations.     eGFR's persistently     <90 mL/min signify     possible Chronic Kidney Disease.   Ct Abdomen Pelvis W Contrast  06/25/2012  *RADIOLOGY REPORT*  Clinical Data: Follow-up necrotizing pancreatitis. Fever.  Nausea and vomiting.  Upper abdominal pain.  CT ABDOMEN AND PELVIS WITH CONTRAST  Technique:  Multidetector CT imaging of the abdomen and pelvis was performed following the standard protocol during bolus administration of intravenous contrast.  Contrast: OMNIPAQUE IOHEXOL 300 MG/ML  SOLN  Comparison: 06/10/2012  Findings: Increase in bilateral lower lobe atelectasis noted.  Tiny left pleural effusion shows no significant change.  A feeding tube remains in place with the tip in the proximal jejunum.  Acute pancreatitis is again demonstrated, with necrosis involving the pancreatic neck and body.  Increased size of a large peripancreatic fluid collection is seen in the central abdomen, posterior to the stomach and extending inferiorly in the central small bowel mesentery.  This collection now measures 8 x 12 cm compared to 5 x 6 cm previously,  consistent with a large pseudocyst.  There are several small internal air bubbles seen within this collection, and infection cannot be excluded.  A smaller fluid collection is seen in the uncinate process of the pancreas which now measures 3.6 x 4.6 cm compared to 2.1 x 2.3 cm previously.  This also consistent with an enlarging pseudocyst. Adjacent reactive thickening of the duodenum and posterior wall of the stomach is again demonstrated.  The splenic vein is not well visualized, suspicious for splenic vein thrombosis.  There is no evidence of portal venous thrombosis.  No evidence of splenomegaly.  Prior cholecystectomy is again noted.  Mild dilatation of central intrahepatic bile duct shows no significant change.  No liver masses or abscesses are identified.  The adrenal  glands are normal appearance.  Tiny nonobstructing bilateral intrarenal calculi are again demonstrated however there is no evidence of renal mass or hydronephrosis.  A small amount of free fluid is seen in the central pelvis, but no discrete pelvic pseudocyst identified.  No pelvic soft tissue masses are identified.  Diverticulosis is again seen involving the descending and sigmoid portions of the colon, however there is no evidence of diverticulitis.  No evidence of bowel obstruction.  IMPRESSION:  1. Acute pancreatitis, with pancreatic necrosis again seen involving the pancreatic neck and body.  Increased size of developing pancreatic pseudocysts seen.  Internal air bubbles are seen in the largest fluid collection, and superimposed infection cannot be excluded by imaging. 2.  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  No evidence of portal vein thrombosis. 3.  Increased bibasilar atelectasis.   Original Report Authenticated By: Myles Rosenthal, M.D.     Assessment/Plan Necrotizing pancreatitis with pseudocyst, possibly infected. Discussed procedure of CT guided aspiration under sedation. Risks and complications discussed. Labs reviewed. Consent signed in chart   Brayton El PA-C 06/27/2012, 8:42 AM

## 2012-06-27 NOTE — Procedures (Signed)
Interventional Radiology Procedure Note  Procedure: CT guided aspiration of pancreatic pseudocyst.  Approximately 70 mL foul smelling opaque tan-yellow was aspirated and sent for culture. Complications: None Recommendations: - Cultures pending  Signed,  Sterling Big, MD Vascular & Interventional Radiologist [facility] Radiology

## 2012-06-27 NOTE — Progress Notes (Signed)
Patient seen, examined, and I agree with the above documentation, including the assessment and plan. S/p IR aspiration of panc phlegmon/cyst today - purulent appearing.  Gram stain, culture - pending.  Blood Cx pending, but neg to date. On imipenem.  BP have been stable.  HR is slightly improved today, mild tachy (100-105 while I was in the room) Appreciate Dr. Ermalene Searing input. Appreciate CCM input. Pt and family are in agreement that if surgery is needed here, they want to proceed.  Still on transfer list to [facility], but [facility] is on diversion.  No beds expected anytime soon.  Transfer center RN told me today "maybe the weekend". If no surgery soon, then would resume enteral feeds Continue pain control and aggressive hydration. Closely watch UOP Patient wants to sign healthcare POA form for documentation.  This will need to be, likely tomorrow, as he had versed today before IR aspiration -- social work can help getting this form Discussed with Dr. Arlyce Dice who will assume the GI service starting this afternoon

## 2012-06-27 NOTE — Progress Notes (Signed)
[facility] Progress Note  Subjective:  Resting more comfortably after receiving Versed for his IR procedure this AM.  Objective:  Vital signs in last 24 hours: Temp:  [97.8 F (36.6 C)-98.8 F (37.1 C)] 98.4 F (36.9 C) (04/17 1000) Pulse Rate:  [93-131] 104 (04/17 1100) Resp:  [14-28] 16 (04/17 1100) BP: (99-152)/(54-89) 116/78 mmHg (04/17 1100) SpO2:  [87 %-100 %] 98 % (04/17 1100) Last BM Date: 06/26/12 General:   Alert, ill-appearing, resting comfortably after receiving Versed. Heart:  Slightly tachy, but regular rhythm; no murmurs Pulm:  CTAB.  No W/R/R. Abdomen:   Somewhat rigid. Very tender diffusely but > in epigastrium; has rebound tenderness. BS present.   Extremities:  Without edema. Neurologic:  Lethargic, but easily arousable;  grossly normal neurologically.  Intake/Output from previous day: 04/16 0701 - 04/17 0700 In: 3010 [I.V.:2400; NG/GT:210; IV Piggyback:400] Out: 1000 [Urine:1000] Intake/Output this shift: Total I/O In: 400 [I.V.:400] Out: 250 [Urine:250]  Lab Results:  Recent Labs  06/25/12 1300 06/26/12 0335 06/27/12 0335  WBC 15.0* 15.2* 14.9*  HGB 10.2* 10.2* 9.2*  HCT 30.0* 30.2* 27.1*  PLT 274 271 295   BMET  Recent Labs  06/25/12 1300 06/26/12 0335 06/27/12 0335  NA 131* 131* 132*  K 3.7 3.7 3.5  CL 95* 95* 98  CO2 29 28 26   GLUCOSE 129* 198* 103*  BUN 13 11 10   CREATININE 0.73 0.76 0.74  CALCIUM 8.6 8.4 8.2*   LFT  Recent Labs  06/27/12 0335  PROT 5.5*  ALBUMIN 1.7*  AST 19  ALT 22  ALKPHOS 108  BILITOT 0.4   PT/INR  Recent Labs  06/25/12 1300  LABPROT 14.8  INR 1.18   Ct Abdomen Pelvis W Contrast  06/25/2012  *RADIOLOGY REPORT*  Clinical Data: Follow-up necrotizing pancreatitis. Fever.  Nausea and vomiting.  Upper abdominal pain.  CT ABDOMEN AND PELVIS WITH CONTRAST  Technique:  Multidetector CT imaging of the abdomen and pelvis was performed following the standard protocol during bolus  administration of intravenous contrast.  Contrast: OMNIPAQUE IOHEXOL 300 MG/ML  SOLN  Comparison: 06/10/2012  Findings: Increase in bilateral lower lobe atelectasis noted.  Tiny left pleural effusion shows no significant change.  A feeding tube remains in place with the tip in the proximal jejunum.  Acute pancreatitis is again demonstrated, with necrosis involving the pancreatic neck and body.  Increased size of a large peripancreatic fluid collection is seen in the central abdomen, posterior to the stomach and extending inferiorly in the central small bowel mesentery.  This collection now measures 8 x 12 cm compared to 5 x 6 cm previously, consistent with a large pseudocyst.  There are several small internal air bubbles seen within this collection, and infection cannot be excluded.  A smaller fluid collection is seen in the uncinate process of the pancreas which now measures 3.6 x 4.6 cm compared to 2.1 x 2.3 cm previously.  This also consistent with an enlarging pseudocyst. Adjacent reactive thickening of the duodenum and posterior wall of the stomach is again demonstrated.  The splenic vein is not well visualized, suspicious for splenic vein thrombosis.  There is no evidence of portal venous thrombosis.  No evidence of splenomegaly.  Prior cholecystectomy is again noted.  Mild dilatation of central intrahepatic bile duct shows no significant change.  No liver masses or abscesses are identified.  The adrenal glands are normal appearance.  Tiny nonobstructing bilateral intrarenal calculi are again demonstrated however there is no evidence of renal  mass or hydronephrosis.  A small amount of free fluid is seen in the central pelvis, but no discrete pelvic pseudocyst identified.  No pelvic soft tissue masses are identified.  Diverticulosis is again seen involving the descending and sigmoid portions of the colon, however there is no evidence of diverticulitis.  No evidence of bowel obstruction.  IMPRESSION:  1.  Acute pancreatitis, with pancreatic necrosis again seen involving the pancreatic neck and body.  Increased size of developing pancreatic pseudocysts seen.  Internal air bubbles are seen in the largest fluid collection, and superimposed infection cannot be excluded by imaging. 2.  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  No evidence of portal vein thrombosis. 3.  Increased bibasilar atelectasis.   Original Report Authenticated By: Myles Rosenthal, M.D.     Assessment / Plan: #12 57 year old Hispanic male with acute severe necrotizing pancreatitis and pseudocyst with an initial presentation 05/30/2012. He presented on 4/15 with recurrent fevers and hypotension as well as progressively severe abdominal pain (meeting SIRS criteria). Blood cultures preliminarily negative.  IR performed CT-guided aspiration today.  #2 chronic back pain awaiting disability, and had been requiring chronic narcotics prior to developing severe pancreatitis  #3 coronary artery disease status post prior MI  #4 malnutrition secondary to #1:  Had been on tube feeds, but those are on hold currently (stopped on 4/16).  -Continue broad spectrum antibiotics (on Primaxin).  -Await gram stain and culture from aspiration. -IV pain control:  Increased to q 2 hours yesterday. -Dr. Rhea Belton has spoken with Dr. Rudean Curt at [facility] and patient has been accepted for transfer, however, he has been placed on the waiting list for a medical step-down bed (still no idea on time-line at this point as to when he will be able to transfer according to transfer center).  -Appreciate surgery, medicine, and critical care consults with assistance and management.    LOS: 2 days   Somtochukwu Woollard D.  06/27/2012, 11:22 AM  Pager number [phone]

## 2012-06-27 NOTE — Progress Notes (Signed)
CSW provided patient/significant other with Advance Directives packet, as patient stated to RN that he is interested in completing HCPOA & Living Will. Patient requested to read over it first and will call when ready to sign it.   Clinical Social Worker encouraged patient/family to contact with any questions or concerns.  Unice Bailey, LCSW [facility] Clinical Social Worker cell #: [phone]

## 2012-06-27 NOTE — Progress Notes (Signed)
Subjective: Pt noticed decrease in pain, feeling a bit better.  Wanting ice chips/water.  Pt not ambulating.  Having BM's and flatus and urinating normally.  Vitals much more stable, afebrile.    Objective: Vital signs in last 24 hours: Temp:  [97.8 F (36.6 C)-98.8 F (37.1 C)] 98.4 F (36.9 C) (04/17 1000) Pulse Rate:  [93-117] 104 (04/17 1100) Resp:  [14-28] 16 (04/17 1100) BP: (99-130)/(54-88) 116/78 mmHg (04/17 1100) SpO2:  [87 %-100 %] 98 % (04/17 1100) Last BM Date: 06/26/12  Intake/Output from previous day: 04/16 0701 - 04/17 0700 In: 3010 [I.V.:2400; NG/GT:210; IV Piggyback:400] Out: 1000 [Urine:1000] Intake/Output this shift: Total I/O In: 400 [I.V.:400] Out: 250 [Urine:250]  PE: Gen:  Alert, NAD, pleasant Abd: less tense, softer, moderate distension, tenderness mostly in upper abdomen, +BS, no HSM   Lab Results:   Recent Labs  06/26/12 0335 06/27/12 0335  WBC 15.2* 14.9*  HGB 10.2* 9.2*  HCT 30.2* 27.1*  PLT 271 295   BMET  Recent Labs  06/26/12 0335 06/27/12 0335  NA 131* 132*  K 3.7 3.5  CL 95* 98  CO2 28 26  GLUCOSE 198* 103*  BUN 11 10  CREATININE 0.76 0.74  CALCIUM 8.4 8.2*   PT/INR  Recent Labs  06/25/12 1300  LABPROT 14.8  INR 1.18   CMP     Component Value Date/Time   NA 132* 06/27/2012 0335   K 3.5 06/27/2012 0335   CL 98 06/27/2012 0335   CO2 26 06/27/2012 0335   GLUCOSE 103* 06/27/2012 0335   BUN 10 06/27/2012 0335   CREATININE 0.74 06/27/2012 0335   CALCIUM 8.2* 06/27/2012 0335   PROT 5.5* 06/27/2012 0335   ALBUMIN 1.7* 06/27/2012 0335   AST 19 06/27/2012 0335   ALT 22 06/27/2012 0335   ALKPHOS 108 06/27/2012 0335   BILITOT 0.4 06/27/2012 0335   GFRNONAA >90 06/27/2012 0335   GFRAA >90 06/27/2012 0335   Lipase     Component Value Date/Time   LIPASE 90* 06/25/2012 1300       Studies/Results: Ct Abdomen Pelvis W Contrast  06/25/2012  *RADIOLOGY REPORT*  Clinical Data: Follow-up necrotizing pancreatitis. Fever.   Nausea and vomiting.  Upper abdominal pain.  CT ABDOMEN AND PELVIS WITH CONTRAST  Technique:  Multidetector CT imaging of the abdomen and pelvis was performed following the standard protocol during bolus administration of intravenous contrast.  Contrast: OMNIPAQUE IOHEXOL 300 MG/ML  SOLN  Comparison: 06/10/2012  Findings: Increase in bilateral lower lobe atelectasis noted.  Tiny left pleural effusion shows no significant change.  A feeding tube remains in place with the tip in the proximal jejunum.  Acute pancreatitis is again demonstrated, with necrosis involving the pancreatic neck and body.  Increased size of a large peripancreatic fluid collection is seen in the central abdomen, posterior to the stomach and extending inferiorly in the central small bowel mesentery.  This collection now measures 8 x 12 cm compared to 5 x 6 cm previously, consistent with a large pseudocyst.  There are several small internal air bubbles seen within this collection, and infection cannot be excluded.  A smaller fluid collection is seen in the uncinate process of the pancreas which now measures 3.6 x 4.6 cm compared to 2.1 x 2.3 cm previously.  This also consistent with an enlarging pseudocyst. Adjacent reactive thickening of the duodenum and posterior wall of the stomach is again demonstrated.  The splenic vein is not well visualized, suspicious for splenic vein thrombosis.  There is no evidence of portal venous thrombosis.  No evidence of splenomegaly.  Prior cholecystectomy is again noted.  Mild dilatation of central intrahepatic bile duct shows no significant change.  No liver masses or abscesses are identified.  The adrenal glands are normal appearance.  Tiny nonobstructing bilateral intrarenal calculi are again demonstrated however there is no evidence of renal mass or hydronephrosis.  A small amount of free fluid is seen in the central pelvis, but no discrete pelvic pseudocyst identified.  No pelvic soft tissue masses are  identified.  Diverticulosis is again seen involving the descending and sigmoid portions of the colon, however there is no evidence of diverticulitis.  No evidence of bowel obstruction.  IMPRESSION:  1. Acute pancreatitis, with pancreatic necrosis again seen involving the pancreatic neck and body.  Increased size of developing pancreatic pseudocysts seen.  Internal air bubbles are seen in the largest fluid collection, and superimposed infection cannot be excluded by imaging. 2.  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  No evidence of portal vein thrombosis. 3.  Increased bibasilar atelectasis.   Original Report Authenticated By: Myles Rosenthal, M.D.     Anti-infectives: Anti-infectives   Start     Dose/Rate Route Frequency Ordered Stop   06/25/12 1330  imipenem-cilastatin (PRIMAXIN) 500 mg in sodium chloride 0.9 % 100 mL IVPB     500 mg 200 mL/hr over 30 Minutes Intravenous 4 times per day 06/25/12 1212         Assessment/Plan Necrotizing Pancreatitis with pseudocysts - pain the same, but vitals are improving 1. Conservative management for now: NPO, hold tube feeds, start Primaxin (only Abx to be of utility in necrotizing pancreatitis).  2. S/p aspiration of fluid which appeared purulent by IR, pending gram stain and culture 3.  Await results of aspiration prior to making decision about surgery.  Dr. Daphine Deutscher to see later today to discuss. 4.  Family and patient is ready to go to OR with Dr. Daphine Deutscher if that is indicated.  Discussed at length with family.     LOS: 2 days    Justin [name] 06/27/2012, 12:34 PM Pager: [phone]

## 2012-06-27 NOTE — H&P (Signed)
Agree with PA note.  Will aspirate for culture.  Signed,  Sterling Big, MD Vascular & Interventional Radiologist [facility]

## 2012-06-27 NOTE — Progress Notes (Signed)
[facility] Consult note                                                                                Patient Demographics  Justin [name], is a 57 y.o. male, DOB - 1955/11/19, ZOX:[id number], WJX:[id number]  Admit date - 06/25/2012  Admitting Physician No admitting provider for patient encounter.  Outpatient Primary MD for the patient is VYAS,DHRUV B., MD  LOS - 2   No chief complaint on file.       Assessment & Plan    1. Epigastric abdominal pain hypotension- patient directly admitted from GI office with suspected infected pancreatic pseudocyst and phlegmon with SIRS- Po status per GI, continue IV antibiotics, IV fluids to be continued rate has been adjusted, blood pressure is better. I will defer further management i.e. consultation of surgery, drainage of the pseudocyst etc. to the primary team GI. Apparently patient has been transferred to [facility] and we are awaiting bed, general surgery had offered surgical intervention few days ago however family wanted any surgical procedures to be done at [facility].    2. History of hypothyroidism. He is n.p.o. Since TSH elevated, increased IV synthroid dose, improved repeat TSH .    3. History of dyslipidemia with elevated triglycerides. Once taking by mouth this will be addressed by GI.     4. CAD. No acute issues. He is pain-free, supportive care for now.     5. Hyponatremia - urine sodium is less than 10 however urine osmolality is greater than serum osmolality, clinically appears dehydrated, continue normal saline monitor BMP. Improving Na.    Discussed bedside with wife and son.   DVT Prophylaxis  Lovenox    Lab Results  Component Value Date   PLT 295 06/27/2012    Medications  Scheduled Meds: . imipenem-cilastatin  500 mg Intravenous Q6H  . levothyroxine  75 mcg Intravenous QAC breakfast  . lidocaine  1 patch Transdermal Q24H  . pantoprazole (PROTONIX) IV  40 mg Intravenous Q24H  . sodium chloride  3 mL  Intravenous Q12H   Continuous Infusions: . sodium chloride 100 mL/hr at 06/27/12 0900   PRN Meds:.acetaminophen (TYLENOL) oral liquid 160 mg/5 mL, chlorproMAZINE, morphine injection, nitroGLYCERIN, ondansetron (ZOFRAN) IV, ondansetron  Antibiotics    Anti-infectives   Start     Dose/Rate Route Frequency Ordered Stop   06/25/12 1330  imipenem-cilastatin (PRIMAXIN) 500 mg in sodium chloride 0.9 % 100 mL IVPB     500 mg 200 mL/hr over 30 Minutes Intravenous 4 times per day 06/25/12 1212         Time Spent in minutes   35  SINGH,PRASHANT K M.D on 06/27/2012 at 9:13 AM  Between 7am to 7pm - Pager - [phone]  After 7pm go to [email or web address] - password [email or web address]  And look for the night coverage person covering for me after hours  [facility] Office  [phone]    Subjective:   Justin [name] today has, No headache, No chest pain, improved but +ve abdominal pain - No Nausea, No new weakness tingling or numbness, No Cough - SOB.    Objective:   Filed Vitals:   06/27/12 0000 06/27/12 0400 06/27/12 0800 06/27/12  0903  BP: 111/59 127/88 105/74 109/57  Pulse: 106 117 110 106  Temp:  98 F (36.7 C) 98.6 F (37 C)   TempSrc:  Oral Oral   Resp: 14 18 18 18   Height:      Weight:      SpO2: 98% 94% 97% 96%    Wt Readings from Last 3 Encounters:  06/25/12 76.4 kg (168 lb 6.9 oz)  06/25/12 77.282 kg (170 lb 6 oz)  06/19/12 74.571 kg (164 lb 6.4 oz)     Intake/Output Summary (Last 24 hours) at 06/27/12 0913 Last data filed at 06/27/12 0900  Gross per 24 hour  Intake   2870 ml  Output   1250 ml  Net   1620 ml    Exam Awake Alert, Oriented X 3, No new F.N deficits, Normal affect [place].AT,PERRAL Supple Neck,No JVD, No cervical lymphadenopathy appriciated.  Symmetrical Chest wall movement, Good air movement bilaterally, CTAB RRR,No Gallops,Rubs or new Murmurs, No Parasternal Heave +ve B.Sounds, Abd is mildly rigid, diffusely tender, +ve guarding No Cyanosis,  Clubbing or edema, No new Rash or bruise      Data Review   Micro Results Recent Results (from the past 240 hour(s))  MRSA PCR SCREENING     Status: None   Collection Time    06/25/12 12:00 PM      Result Value Range Status   MRSA by PCR NEGATIVE  NEGATIVE Final   Comment:            The GeneXpert MRSA Assay (FDA     approved for NASAL specimens     only), is one component of a     comprehensive MRSA colonization     surveillance program. It is not     intended to diagnose MRSA     infection nor to guide or     monitor treatment for     MRSA infections.  CULTURE, BLOOD (ROUTINE X 2)     Status: None   Collection Time    06/25/12  1:00 PM      Result Value Range Status   Specimen Description BLOOD LEFT HAND   Final   Special Requests BOTTLES DRAWN AEROBIC AND ANAEROBIC 6CC   Final   Culture  Setup Time 06/25/2012 21:58   Final   Culture     Final   Value:        BLOOD CULTURE RECEIVED NO GROWTH TO DATE CULTURE WILL BE HELD FOR 5 DAYS BEFORE ISSUING A FINAL NEGATIVE REPORT   Report Status PENDING   Incomplete  CULTURE, BLOOD (ROUTINE X 2)     Status: None   Collection Time    06/25/12  1:15 PM      Result Value Range Status   Specimen Description BLOOD LEFT ARM   Final   Special Requests BOTTLES DRAWN AEROBIC AND ANAEROBIC [facility]   Final   Culture  Setup Time 06/25/2012 21:58   Final   Culture     Final   Value:        BLOOD CULTURE RECEIVED NO GROWTH TO DATE CULTURE WILL BE HELD FOR 5 DAYS BEFORE ISSUING A FINAL NEGATIVE REPORT   Report Status PENDING   Incomplete  URINE CULTURE     Status: None   Collection Time    06/25/12  3:49 PM      Result Value Range Status   Specimen Description URINE, CLEAN CATCH   Final   Special Requests NONE  Final   Culture  Setup Time 06/25/2012 22:00   Final   Colony Count NO GROWTH   Final   Culture NO GROWTH   Final   Report Status 06/26/2012 FINAL   Final    Radiology Reports     Ct Abdomen Pelvis W Contrast  06/10/2012   *RADIOLOGY REPORT*  Clinical Data: Pancreatitis.  The upper quadrant pain.  Nausea vomiting.  CT ABDOMEN AND PELVIS WITH CONTRAST  Technique:  Multidetector CT imaging of the abdomen and pelvis was performed following the standard protocol during bolus administration of intravenous contrast.  Contrast: 80mL OMNIPAQUE IOHEXOL 300 MG/ML  SOLN  Comparison: 318/21/14  Findings: imaging through the lung bases shows bibasilar collapse / consolidation with small left pleural effusion.  Tiny low-density lesions in the liver are stable.  Intrahepatic biliary duct dilatation has decreased in the interval.  Spleen is unremarkable.  Feeding tube tip is transpyloric, in the proximal jejunum.  5.0 x 5.7 cm low density collection identified in the retroperitoneum at the level of the pancreatic body.  There is no enhancing pancreatic parenchyma in the body the pancreas, consistent with pancreatic necrosis and phlegmonous change within the retroperitoneal space.  1.9 x 5.1 cm rim enhancing fluid collection anterior to the pancreatic tail is compatible with evolving pseudocyst.  There is fluid in the root of the small bowel mesentery.  These changes generate significant mass effect on the superior mesenteric vein and splenic vein, near the portosplenic confluence.  The splenic vein is only a string sign as it enters the confluence.  Multiple tiny stones are seen in the kidneys bilaterally without urinary obstruction.  No abdominal aortic aneurysm.  No retroperitoneal lymphadenopathy.  Imaging through the pelvis shows no free intraperitoneal fluid. There is no pelvic sidewall lymphadenopathy.  Diverticular changes are seen in the colon without evidence for diverticulitis. Terminal ileum and appendix are normal.  IMPRESSION: Evidence of pancreatic necrosis and phegmon within the pancreatic body and probable evolving pseudocyst in the central mesentery. Mass effect from the phlegmon generates substantial attenuation of the superior  mesenteric and splenic veins as they track into the portosplenic confluence.  The portal vein remains patent at this time.   Original Report Authenticated By: Kennith Center, M.D.      CBC  Recent Labs Lab 06/25/12 1300 06/26/12 0335 06/27/12 0335  WBC 15.0* 15.2* 14.9*  HGB 10.2* 10.2* 9.2*  HCT 30.0* 30.2* 27.1*  PLT 274 271 295  MCV 82.4 82.7 82.9  MCH 28.0 27.9 28.1  MCHC 34.0 33.8 33.9  RDW 14.3 14.6 14.7    Chemistries   Recent Labs Lab 06/25/12 1300 06/26/12 0335 06/27/12 0335  NA 131* 131* 132*  K 3.7 3.7 3.5  CL 95* 95* 98  CO2 29 28 26   GLUCOSE 129* 198* 103*  BUN 13 11 10   CREATININE 0.73 0.76 0.74  CALCIUM 8.6 8.4 8.2*  AST 12 15 19   ALT 18 19 22   ALKPHOS 126* 124* 108  BILITOT 0.5 0.4 0.4   ------------------------------------------------------------------------------------------------------------------ estimated creatinine clearance is 95.2 ml/min (by C-G formula based on Cr of 0.74). ------------------------------------------------------------------------------------------------------------------ No results found for this basename: HGBA1C,  in the last 72 hours ------------------------------------------------------------------------------------------------------------------ No results found for this basename: CHOL, HDL, LDLCALC, TRIG, CHOLHDL, LDLDIRECT,  in the last 72 hours ------------------------------------------------------------------------------------------------------------------  Recent Labs  06/26/12 0820  TSH 14.064*  T4TOTAL 5.9  T3FREE 1.4*   ------------------------------------------------------------------------------------------------------------------ No results found for this basename: VITAMINB12, FOLATE, FERRITIN, TIBC, IRON, RETICCTPCT,  in the  last 72 hours  Coagulation profile  Recent Labs Lab 06/25/12 1300  INR 1.18    No results found for this basename: DDIMER,  in the last 72 hours  Cardiac Enzymes No results  found for this basename: CK, CKMB, TROPONINI, MYOGLOBIN,  in the last 168 hours ------------------------------------------------------------------------------------------------------------------ No components found with this basename: POCBNP,

## 2012-06-28 ENCOUNTER — Inpatient Hospital Stay (HOSPITAL_COMMUNITY): Payer: Medicare Other

## 2012-06-28 DIAGNOSIS — R079 Chest pain, unspecified: Secondary | ICD-10-CM | POA: Diagnosis present

## 2012-06-28 DIAGNOSIS — K859 Acute pancreatitis without necrosis or infection, unspecified: Principal | ICD-10-CM | POA: Diagnosis present

## 2012-06-28 LAB — COMPREHENSIVE METABOLIC PANEL
AST: 19 U/L (ref 0–37)
Albumin: 1.8 g/dL — ABNORMAL LOW (ref 3.5–5.2)
Alkaline Phosphatase: 98 U/L (ref 39–117)
BUN: 12 mg/dL (ref 6–23)
Chloride: 97 mEq/L (ref 96–112)
Creatinine, Ser: 0.7 mg/dL (ref 0.50–1.35)
Potassium: 3.4 mEq/L — ABNORMAL LOW (ref 3.5–5.1)
Total Bilirubin: 0.5 mg/dL (ref 0.3–1.2)
Total Protein: 5.8 g/dL — ABNORMAL LOW (ref 6.0–8.3)

## 2012-06-28 LAB — GLUCOSE, CAPILLARY
Glucose-Capillary: 105 mg/dL — ABNORMAL HIGH (ref 70–99)
Glucose-Capillary: 111 mg/dL — ABNORMAL HIGH (ref 70–99)
Glucose-Capillary: 119 mg/dL — ABNORMAL HIGH (ref 70–99)

## 2012-06-28 LAB — CBC
MCHC: 34.2 g/dL (ref 30.0–36.0)
Platelets: 341 10*3/uL (ref 150–400)
RDW: 14.7 % (ref 11.5–15.5)
WBC: 14.7 10*3/uL — ABNORMAL HIGH (ref 4.0–10.5)

## 2012-06-28 LAB — TROPONIN I: Troponin I: <0.3 ng/mL (ref <0.30)

## 2012-06-28 MED ORDER — KCL IN DEXTROSE-NACL 40-5-0.9 MEQ/L-%-% IV SOLN
INTRAVENOUS | Status: DC
  Administered 2012-06-28: 11:00:00 via INTRAVENOUS
  Filled 2012-06-28 (×3): qty 1000

## 2012-06-28 MED ORDER — OSMOLITE 1.2 CAL PO LIQD
1000.0000 mL | ORAL | Status: DC
  Administered 2012-06-28: 1000 mL

## 2012-06-28 MED ORDER — POTASSIUM CHLORIDE 10 MEQ/100ML IV SOLN
10.0000 meq | INTRAVENOUS | Status: AC
  Administered 2012-06-28 (×2): 10 meq via INTRAVENOUS
  Filled 2012-06-28: qty 200

## 2012-06-28 MED ORDER — PHENOL 1.4 % MT LIQD
1.0000 | OROMUCOSAL | Status: DC | PRN
  Administered 2012-06-28: 1 via OROMUCOSAL

## 2012-06-28 MED ORDER — ENOXAPARIN SODIUM 40 MG/0.4ML ~~LOC~~ SOLN
40.0000 mg | SUBCUTANEOUS | Status: DC
  Administered 2012-06-28: 40 mg via SUBCUTANEOUS
  Filled 2012-06-28: qty 0.4

## 2012-06-28 MED ORDER — PRO-STAT SUGAR FREE PO LIQD
30.0000 mL | Freq: Two times a day (BID) | ORAL | Status: DC
  Filled 2012-06-28 (×2): qty 30

## 2012-06-28 MED ORDER — MORPHINE SULFATE 2 MG/ML IJ SOLN
2.0000 mg | INTRAMUSCULAR | Status: DC | PRN
  Administered 2012-06-28: 4 mg via INTRAVENOUS
  Administered 2012-06-28: 2 mg via INTRAVENOUS
  Administered 2012-06-28 (×3): 4 mg via INTRAVENOUS
  Administered 2012-06-28: 2 mg via INTRAVENOUS
  Filled 2012-06-28 (×3): qty 2
  Filled 2012-06-28: qty 1
  Filled 2012-06-28 (×2): qty 2

## 2012-06-28 NOTE — Progress Notes (Signed)
INITIAL NUTRITION ASSESSMENT  DOCUMENTATION CODES Per approved criteria  -Not Applicable   INTERVENTION: Initiate Osmolite 1.2 @ 20 ml/hr via NJ tube and increase by 10 ml every 8 hours to goal rate of 75 ml/hr. 30 ml Prostat BID.  At goal rate, tube feeding regimen will provide 2360 kcal, 130 grams of protein, and 1476 ml of H2O.  This will meet 100% of estimated calorie needs and 100% of estimated protein needs.  If pt develops abdominal pain/cramping and diarrhea or additional signs of malabsorption, recommend switching formula to Vital 1.5 @60  with Prostat BID.  Recommend providing Miralax prn per pt's request  NUTRITION DIAGNOSIS: Inadequate oral intake related to medical condition and inability to eat as evidenced by NPO status and pt with severe necrotizing pancreatitis.   Goal: Pt to meet >/= 90% of their estimated nutrition needs  Monitor:  TF initiation/tolerance/rate Bowel function Wt Labs  Reason for Assessment: Consult for TF initiation and management  57 y.o. male  Admitting Dx: Severe Necrotizing pancreatitis  ASSESSMENT: 57 year old Hispanic male with acute severe necrotizing pancreatitis and pseudocyst with an initial presentation 05/30/2012. He presented on 4/15 with recurrent fevers and hypotension as well as progressively severe abdominal pain (meeting SIRS criteria). Pt was on Osmolite 1.2@ 20 ml/hr at time of visit. Pt denied abdominal pain, nausea, and diarrhea at time of visit but does report having loose bowel movements 3 times already today. Per family in the room pt had been on Jevity 1.2 @ 70 ml/hr for several weeks PTA and had tolerated feeds well but had needed Miralax due to bloating/constipation. Pt states that TF rate had been increased very slowly over the course of a few days before. Pt reports usual body weight at 189 lbs.   Height: Ht Readings from Last 1 Encounters:  06/25/12 5\' 7"  (1.702 m)    Weight: Wt Readings from Last 1 Encounters:   06/28/12 177 lb 11.1 oz (80.6 kg)    Ideal Body Weight: 148 lbs  % Ideal Body Weight: 119%  Wt Readings from Last 10 Encounters:  06/28/12 177 lb 11.1 oz (80.6 kg)  06/25/12 170 lb 6 oz (77.282 kg)  06/19/12 164 lb 6.4 oz (74.571 kg)  06/14/12 166 lb 7.2 oz (75.5 kg)  09/18/10 165 lb (74.844 kg)    Usual Body Weight: 189 lbs  % Usual Body Weight: 94%  BMI:  Body mass index is 27.82 kg/(m^2).  Estimated Nutritional Needs: Kcal: 2290-2615 Protein: 121-145 grams Fluid: 2.4 L  Skin: non-pitting RUE edema, +1 LUE, RLE, and LLE edema; ecchymosis on abdomen  Diet Order: NPO  EDUCATION NEEDS: -No education needs identified at this time   Intake/Output Summary (Last 24 hours) at 06/28/12 1307 Last data filed at 06/28/12 0800  Gross per 24 hour  Intake   2200 ml  Output   1150 ml  Net   1050 ml    Last BM: 4/17  Labs:   Recent Labs Lab 06/26/12 0335 06/27/12 0335 06/28/12 0220  NA 131* 132* 131*  K 3.7 3.5 3.4*  CL 95* 98 97  CO2 28 26 26   BUN 11 10 12   CREATININE 0.76 0.74 0.70  CALCIUM 8.4 8.2* 8.4  GLUCOSE 198* 103* 105*    CBG (last 3)   Recent Labs  06/28/12 0356 06/28/12 0754 06/28/12 1111  GLUCAP 111* 119* 119*    Scheduled Meds: . enoxaparin (LOVENOX) injection  40 mg Subcutaneous Q24H  . imipenem-cilastatin  500 mg Intravenous Q6H  .  levothyroxine  75 mcg Intravenous QAC breakfast  . lidocaine  1 patch Transdermal Q24H  . pantoprazole (PROTONIX) IV  40 mg Intravenous Q24H  . sodium chloride  3 mL Intravenous Q12H    Continuous Infusions: . dextrose 5 % and 0.9 % NaCl with KCl 40 mEq/L 100 mL/hr at 06/28/12 1115  . feeding supplement (OSMOLITE 1.2 CAL) 1,000 mL (06/28/12 1204)    Past Medical History  Diagnosis Date  . Back pain   . Hypertension   . Asthma   . Thyroid disease   . High cholesterol   . Stroke   . Fibromyalgia   . Myocardial infarct   . GERD (gastroesophageal reflux disease)   . Pancreatitis   . Pneumonia    . Alcoholism     Past Surgical History  Procedure Laterality Date  . Back surgery    . Cholecystectomy  2009  . Finger surgery      Ian Malkin RD, LDN Inpatient Clinical Dietitian Pager: [phone] After Hours Pager: [phone]

## 2012-06-28 NOTE — Progress Notes (Signed)
[facility] Progress Note  Subjective:  Says pain is 8/10.  Passing flatus.  Objective:  Vital signs in last 24 hours: Temp:  [98.1 F (36.7 C)-101 F (38.3 C)] 98.5 F (36.9 C) (04/18 0800) Pulse Rate:  [90-115] 108 (04/18 0800) Resp:  [15-26] 24 (04/18 0800) BP: (99-164)/(54-101) 149/88 mmHg (04/18 0800) SpO2:  [92 %-98 %] 95 % (04/18 0800) Weight:  [177 lb 11.1 oz (80.6 kg)] 177 lb 11.1 oz (80.6 kg) (04/18 0000) Last BM Date: 06/27/12 General:   Alert, ill-appearing. Heart:  Tachy but regular rhythm; no murmurs Pulm:  CTAB.  No W/R/R. Abdomen:  Very tender diffusely but > in the epigastrium; has rebound tenderness.  BS present.  Extremities:  Without edema. Neurologic:  Alert and  oriented x4;  grossly normal neurologically. Psych:  Alert and cooperative.  Intake/Output from previous day: 04/17 0701 - 04/18 0700 In: 2400 [I.V.:1900; IV Piggyback:500] Out: 1750 [Urine:1750] Intake/Output this shift: Total I/O In: 500 [I.V.:500] Out: -   Lab Results:  Recent Labs  06/26/12 0335 06/27/12 0335 06/28/12 0220  WBC 15.2* 14.9* 14.7*  HGB 10.2* 9.2* 10.2*  HCT 30.2* 27.1* 29.8*  PLT 271 295 341   BMET  Recent Labs  06/26/12 0335 06/27/12 0335 06/28/12 0220  NA 131* 132* 131*  K 3.7 3.5 3.4*  CL 95* 98 97  CO2 28 26 26   GLUCOSE 198* 103* 105*  BUN 11 10 12   CREATININE 0.76 0.74 0.70  CALCIUM 8.4 8.2* 8.4   LFT  Recent Labs  06/28/12 0220  PROT 5.8*  ALBUMIN 1.8*  AST 19  ALT 24  ALKPHOS 98  BILITOT 0.5   PT/INR  Recent Labs  06/25/12 1300  LABPROT 14.8  INR 1.18    Ct Aspiration  06/27/2012  *RADIOLOGY REPORT*  CT GUIDED ASPIRATION  Date: 06/27/2012  Clinical History: 57 year old male with necrotizing pancreatitis and pancreatic pseudocyst.  Not clinical concern for possible infection.  CT guided aspiration of fluid is requested.  Procedures Performed: 1. CT guided aspiration  Interventional Radiologist:  Sterling Big,  MD  Sedation: Moderate (conscious) sedation was used.  1.5 mg Versed, 75 mcg Fentanyl were administered intravenously.  [name] vital signs were monitored continuously by radiology nursing throughout the procedure.  Sedation Time: 10 minutes minutes  PROCEDURE/FINDINGS:   Informed consent was obtained from the patient following explanation of the procedure, risks, benefits and alternatives. The patient understands, agrees and consents for the procedure. All questions were addressed. A time out was performed.  Maximal barrier sterile technique utilized including caps, mask, sterile gowns, sterile gloves, large sterile drape, hand hygiene, and betadine skin prep.  A planning axial CT scan was performed.  The pancreatic pseudocyst was successfully identified.  An appropriate approach was selected and the skin marked.  Local anesthesia was attained by infiltration of 1% lidocaine.  Using CT fluoroscopic guidance, a 5-French Yueh centesis catheter was advanced into the fluid collection via a transgastric approach.  Approximately 70 ml of foul-smelling opaque yellow/tan fluid was successfully aspirated.  The Yueh centesis catheter was removed.  The patient tolerated the procedure well, there is no immediate complication. The aspirated fluid was sent for culture.  IMPRESSION:  Technically successful CT-guided aspiration of pancreatic pseudocyst / necrotic pancreatic phlegmon.  A total of 70 ml of foul-smelling opaque tan fluid was aspirated and sent for culture.  Signed,  Sterling Big, MD Vascular & Interventional Radiologist [facility]   Original Report Authenticated By: Malachy Moan,  M.D.    Dg Chest Port 1 View  06/28/2012  *RADIOLOGY REPORT*  Clinical Data: Follow-up atelectasis  PORTABLE CHEST - 1 VIEW  Comparison: 06/27/2012  Findings: Bibasilar atelectasis persists and is unchanged.  There is no pneumothorax. Slight air bronchograms in the retrocardiac region redemonstrated, and  unchanged.  Cardiac enlarged with tortuous aorta.  Feeding tube remains, extending below the hemidiaphragm.  IMPRESSION: Unchanged portable erect radiograph.  Consider two-view chest when the patient is stable to exclude a focal infiltrate at the left base.   Original Report Authenticated By: Davonna Belling, M.D.    Dg Chest Port 1 View  06/27/2012  *RADIOLOGY REPORT*  Clinical Data: Chest pain  PORTABLE CHEST - 1 VIEW  Comparison: Pain chest x-ray of 05/30/2012  Findings: There is bibasilar linear atelectasis present.  No definite pneumonia is seen.  Two-view chest x-ray may be helpful to exclude opacity at the left lung base.  Mild cardiomegaly is stable.  The feeding tube extends below the hemidiaphragm.  IMPRESSION: Bibasilar atelectasis.  Consider two-view chest x-ray to exclude pneumonia particularly at the left lung base.   Original Report Authenticated By: Dwyane Dee, M.D.     Assessment / Plan: #30 57 year old Hispanic male with acute severe necrotizing pancreatitis and pseudocyst with an initial presentation 05/30/2012. He presented on 4/15 with recurrent fevers and hypotension as well as progressively severe abdominal pain (meeting SIRS criteria). Blood cultures preliminarily negative. IR performed CT-guided aspiration today and gram stain shows abundant GNR and GPR (culture pending).  #2 chronic back pain awaiting disability, and had been requiring chronic narcotics prior to developing severe pancreatitis  #3 coronary artery disease status post prior MI  #4 malnutrition secondary to #1.  -Continue broad spectrum antibiotics (on Primaxin).  -Await culture from aspiration.  -IV pain control -Patient awaiting transfer to [facility] and patient has been accepted for transfer, however, he has been placed on the waiting list for a medical step-down bed.  No definite time as to when patient can be transferred; family willing to have patient go to surgery here if deemed appropriate.   -Ok to restart tube  feeds if patient does not go to surgery. -Appreciate surgery, medicine, and critical care consults with assistance and management.       LOS: 3 days   Syana Degraffenreid D.  06/28/2012, 9:02 AM  Pager number [phone]

## 2012-06-28 NOTE — Discharge Summary (Signed)
[facility] Discharge Summary  Name: Justin [name] MRN: [id number] DOB: 07-Dec-1955 57 y.o. PCP:  [name]., MD  Date of Admission: 06/25/2012 11:53 AM Date of Discharge: 06/28/2012 Attending Physician: Beverley Fiedler, MD  Discharge Diagnosis: Active Problems:   CAD (coronary artery disease) of artery bypass graft   Unspecified protein-calorie malnutrition   SIRS (systemic inflammatory response syndrome)   Acute pancreatitis   Pancreatic abscess   Chest pain epsode 4/17 - relieved with NTG   Consultations: Treatment Team:  Md Ccs, MD  Procedures Performed:  Dg Abd 1 View  06/14/2012  *RADIOLOGY REPORT*  Clinical Data: Evaluate feeding tube position  ABDOMEN - 1 VIEW  Comparison: Abdomen film of 06/13/2012  Findings: The tip of the feeding tube is located just beyond the ligament of Treitz, documented by injection of a small amount of contrast.  The feeding tube was placed under fluoroscopy by radiologic technologist Tara Dingus.  IMPRESSION: Tip of feeding tube in good position just beyond the ligament of Treitz.   Original Report Authenticated By: Dwyane Dee, M.D.    Dg Abd 1 View  06/13/2012  *RADIOLOGY REPORT*  Clinical Data: Evaluate placement of feeding tube.  ABDOMEN - 1 VIEW  Comparison: 06/09/2012.  Findings: Metallic tip of a feeding tube appears coiled upon itself, likely in the region of the duodenal bulb.  The tube is likely to be transpyloric, with a portion of the tube extending into the second portion of the duodenum.  Proximally, the tube does appear coiled several times within the stomach.  Visualized bowel gas pattern is nonobstructive. Surgical clips project over the right upper quadrant of the abdomen, compatible with prior cholecystectomy.  IMPRESSION: The tip of the feeding tube appears to be in the region of the duodenal bulb, as above.   Original Report Authenticated By: Trudie Reed, M.D.    Dg Abd 1 View  06/04/2012  *RADIOLOGY REPORT*  Clinical  Data: Feeding tube placement  ABDOMEN - 1 VIEW  Comparison: None.  Fluoroscopy time: 18 seconds  Findings: Weighted feeding tube has been placed in the proximal duodenum just past the ligament of Treitz.  IMPRESSION: Weighted feeding tube terminates in the proximal duodenum.   Original Report Authenticated By: Charline Bills, M.D.    Ct Abdomen Pelvis W Contrast  06/25/2012  *RADIOLOGY REPORT*  Clinical Data: Follow-up necrotizing pancreatitis. Fever.  Nausea and vomiting.  Upper abdominal pain.  CT ABDOMEN AND PELVIS WITH CONTRAST  Technique:  Multidetector CT imaging of the abdomen and pelvis was performed following the standard protocol during bolus administration of intravenous contrast.  Contrast: OMNIPAQUE IOHEXOL 300 MG/ML  SOLN  Comparison: 06/10/2012  Findings: Increase in bilateral lower lobe atelectasis noted.  Tiny left pleural effusion shows no significant change.  A feeding tube remains in place with the tip in the proximal jejunum.  Acute pancreatitis is again demonstrated, with necrosis involving the pancreatic neck and body.  Increased size of a large peripancreatic fluid collection is seen in the central abdomen, posterior to the stomach and extending inferiorly in the central small bowel mesentery.  This collection now measures 8 x 12 cm compared to 5 x 6 cm previously, consistent with a large pseudocyst.  There are several small internal air bubbles seen within this collection, and infection cannot be excluded.  A smaller fluid collection is seen in the uncinate process of the pancreas which now measures 3.6 x 4.6 cm compared to 2.1 x 2.3 cm previously.  This also consistent  with an enlarging pseudocyst. Adjacent reactive thickening of the duodenum and posterior wall of the stomach is again demonstrated.  The splenic vein is not well visualized, suspicious for splenic vein thrombosis.  There is no evidence of portal venous thrombosis.  No evidence of splenomegaly.  Prior  cholecystectomy is again noted.  Mild dilatation of central intrahepatic bile duct shows no significant change.  No liver masses or abscesses are identified.  The adrenal glands are normal appearance.  Tiny nonobstructing bilateral intrarenal calculi are again demonstrated however there is no evidence of renal mass or hydronephrosis.  A small amount of free fluid is seen in the central pelvis, but no discrete pelvic pseudocyst identified.  No pelvic soft tissue masses are identified.  Diverticulosis is again seen involving the descending and sigmoid portions of the colon, however there is no evidence of diverticulitis.  No evidence of bowel obstruction.  IMPRESSION:  1. Acute pancreatitis, with pancreatic necrosis again seen involving the pancreatic neck and body.  Increased size of developing pancreatic pseudocysts seen.  Internal air bubbles are seen in the largest fluid collection, and superimposed infection cannot be excluded by imaging. 2.  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  No evidence of portal vein thrombosis. 3.  Increased bibasilar atelectasis.   Original Report Authenticated By: Myles Rosenthal, M.D.    Ct Abdomen Pelvis W Contrast  06/10/2012  *RADIOLOGY REPORT*  Clinical Data: Pancreatitis.  The upper quadrant pain.  Nausea vomiting.  CT ABDOMEN AND PELVIS WITH CONTRAST  Technique:  Multidetector CT imaging of the abdomen and pelvis was performed following the standard protocol during bolus administration of intravenous contrast.  Contrast: 80mL OMNIPAQUE IOHEXOL 300 MG/ML  SOLN  Comparison: 318/21/14  Findings: imaging through the lung bases shows bibasilar collapse / consolidation with small left pleural effusion.  Tiny low-density lesions in the liver are stable.  Intrahepatic biliary duct dilatation has decreased in the interval.  Spleen is unremarkable.  Feeding tube tip is transpyloric, in the proximal jejunum.  5.0 x 5.7 cm low density collection identified in the retroperitoneum at the level of  the pancreatic body.  There is no enhancing pancreatic parenchyma in the body the pancreas, consistent with pancreatic necrosis and phlegmonous change within the retroperitoneal space.  1.9 x 5.1 cm rim enhancing fluid collection anterior to the pancreatic tail is compatible with evolving pseudocyst.  There is fluid in the root of the small bowel mesentery.  These changes generate significant mass effect on the superior mesenteric vein and splenic vein, near the portosplenic confluence.  The splenic vein is only a string sign as it enters the confluence.  Multiple tiny stones are seen in the kidneys bilaterally without urinary obstruction.  No abdominal aortic aneurysm.  No retroperitoneal lymphadenopathy.  Imaging through the pelvis shows no free intraperitoneal fluid. There is no pelvic sidewall lymphadenopathy.  Diverticular changes are seen in the colon without evidence for diverticulitis. Terminal ileum and appendix are normal.  IMPRESSION: Evidence of pancreatic necrosis and phegmon within the pancreatic body and probable evolving pseudocyst in the central mesentery. Mass effect from the phlegmon generates substantial attenuation of the superior mesenteric and splenic veins as they track into the portosplenic confluence.  The portal vein remains patent at this time.   Original Report Authenticated By: Kennith Center, M.D.    Ct Abdomen Pelvis W Contrast  05/31/2012  *RADIOLOGY REPORT*  Clinical Data: Acute pancreatitis, mid abdominal pain  CT ABDOMEN AND PELVIS WITH CONTRAST  Technique:  Multidetector CT imaging of the  abdomen and pelvis was performed following the standard protocol during bolus administration of intravenous contrast.  Contrast: 50mL OMNIPAQUE IOHEXOL 300 MG/ML  SOLN, 1 OMNIPAQUE IOHEXOL 300 MG/ML  SOLN, OMNIPAQUE IOHEXOL 300 MG/ML  SOLN  Comparison: Chest and abdomen films of 05/30/2012  Findings: There is bibasilar linear atelectasis or scarring present.  No effusion is seen. There  does appear to be a small hiatal hernia present.  The liver enhances and there is prominence of the central intrahepatic ducts.  Surgical clips are present from prior cholecystectomy.  However there is marked edema of the neck and proximal body of the pancreas with considerable peripancreatic exudate consistent with acute pancreatitis.  The common bile duct is somewhat prominent measuring 9 mm in diameter but no definite calculus is seen. Stricture or mass cannot be excluded although no obvious mass is evident.  The fact that the head of the pancreatic parenchyma enhances as does the distal body and tail, but there is no enhancement of the distal head and proximal body is worrisome for pancreatic necrosis.  There is edema of the adjacent descending duodenum and distal stomach.  No discrete abscess or pseudocyst is evident.  The adrenal glands and spleen are unremarkable.  The stomach is moderately distended with contrast with no gross abnormality other than the previously noted edema.  The kidneys enhance with several small nonobstructing renal calculi noted bilaterally.  The abdominal aorta is normal in caliber.  No adenopathy is seen.  Pancreatic exudate extends into the right retroperitoneum.  There is also a moderate amount of free fluid noted layering within the pelvis.  The urinary bladder is unremarkable.  The prostate is normal in size.  Scattered rectosigmoid colonic diverticula are seen.  No abnormality of the terminal ileum or appendix is noted. Degenerative change is noted involving the facet joints of L4-5 and L5-S1.  IMPRESSION:  1.  Acute pancreatitis with edema of the distal head and proximal body the pancreas and considerable surrounding exudate extending into the pelvis.  The lack of enhancement of the distal head and proximal body the pancreas is worrisome for pancreatic necrosis. No abscess or pseudocyst is evident currently. 2.  Prominent common bile duct and intrahepatic ducts may be due to  edema but a non-visualized distal calculus, stricture or mass cannot be excluded as the etiology. 3.  Moderate amount of free fluid in the pelvis. 4.  Small hiatal hernia. 5.  Small nonobstructing bilateral renal calculi.   Original Report Authenticated By: Dwyane Dee, M.D.    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  Not clinical concern for possible infection.  CT guided aspiration of fluid is requested.  Procedures Performed: 1. CT guided aspiration  Interventional Radiologist:  Sterling Big, MD  Sedation: Moderate (conscious) sedation was used.  1.5 mg Versed, 75 mcg Fentanyl were administered intravenously.  [name] vital signs were monitored continuously by radiology nursing throughout the procedure.  Sedation Time: 10 minutes minutes  PROCEDURE/FINDINGS:   Informed consent was obtained from the patient following explanation of the procedure, risks, benefits and alternatives. The patient understands, agrees and consents for the procedure. All questions were addressed. A time out was performed.  Maximal barrier sterile technique utilized including caps, mask, sterile gowns, sterile gloves, large sterile drape, hand hygiene, and betadine skin prep.  A planning axial CT scan was performed.  The pancreatic pseudocyst was successfully  identified.  An appropriate approach was selected and the skin marked.  Local anesthesia was attained by infiltration of 1% lidocaine.  Using CT fluoroscopic guidance, a 5-French Yueh centesis catheter was advanced into the fluid collection via a transgastric approach.  Approximately 70 ml of foul-smelling opaque yellow/tan fluid was successfully aspirated.  The Yueh centesis catheter was removed.  The patient tolerated the procedure well, there is no immediate complication. The aspirated fluid was sent for culture.  IMPRESSION:   Technically successful CT-guided aspiration of pancreatic pseudocyst / necrotic pancreatic phlegmon.  A total of 70 ml of foul-smelling opaque tan fluid was aspirated and sent for culture.  Signed,  Sterling Big, MD Vascular & Interventional Radiologist [facility]   Original Report Authenticated By: Malachy Moan, M.D.    Mr 3d Recon At Scanner  06/02/2012  *RADIOLOGY REPORT*  Clinical Data:  Pancreatitis and biliary dilatation.  Elevated liver function tests.  MRI ABDOMEN WITHOUT AND WITH CONTRAST (INCLUDING MRCP)  Technique:  Multiplanar multisequence MR imaging of the abdomen was performed both before and after the administration of intravenous contrast. Heavily T2-weighted images of the biliary and pancreatic ducts were obtained, and three-dimensional MRCP images were rendered by post processing.  Contrast: 20 ml Multihance  Comparison:  05/31/2012  Findings:  Despite efforts by the patient and technologist, motion artifact is present on some series of today's examination and could not be totally eliminated.  This reduces diagnostic sensitivity and specificity.  Trace bilateral pleural effusions noted with mesenteric edema and mild perihepatic and perisplenic ascites.  Edema tracks within and around the pancreas, compatible with pancreatitis.  Gallbladder absent.  The common bile duct measures a maximum of 6 mm on today's examination.  On image 15 of series 3, the portion of the common bile duct adjacent to the pancreatic head is somewhat irregular, and there is an appearance of abrupt truncation of the CBD adjacent to the ampulla on that same image. This abrupt truncation is shown on the T2-weighted images, but on the postcontrast images such as images 66-77 of series 1504, there appears to be a focal stricturing or extrinsic narrowing of the CBD but with a small channel communicating with the ampulla  Abnormal high precontrast T1 signal in the pancreatic body is shown on images 48-68 of  series 1500, compatible with hemorrhage. This corresponds to a lack of enhancement in the pancreatic body highly concerning for pancreatic necrosis, as noted on the prior CT scan.  IMPRESSION: 1.  The common bile duct currently only measures 6 mm in diameter, but there is focal irregular narrowing in the CBD along the pancreatic head, and also focal narrowing in the distal CBD in the vicinity of the ampulla. Loss of the normal conical distal CBD tapering.  T2-weighted images suggest an abrupt truncation of the distal-most CBD as can be seen with obstructing stone, but the postcontrast images seem to demonstrate a small channel connecting through to the ampulla raising the possibility that the distal CBD stenosis may be from stricturing or extrinsic compression related to the underlying pancreatitis.  Differentiation is not helped by the degree of motion artifact. 2.  Pancreatic necrosis of the pancreatic body, with associated hemorrhage in this portion of the pancreas.  3.  Trace bilateral pleural effusions with adjacent atelectasis; mesenteric edema along with perihepatic and perisplenic ascites. Peripancreatic edema pattern with acute pancreatitis.   Original Report Authenticated By: Gaylyn Rong, M.D.    Dg Chest Port 1 View  06/28/2012  *RADIOLOGY REPORT*  Clinical Data: Follow-up atelectasis  PORTABLE CHEST - 1 VIEW  Comparison: 06/27/2012  Findings: Bibasilar atelectasis persists and is unchanged.  There is no pneumothorax. Slight air bronchograms in the retrocardiac region redemonstrated, and unchanged.  Cardiac enlarged with tortuous aorta.  Feeding tube remains, extending below the hemidiaphragm.  IMPRESSION: Unchanged portable erect radiograph.  Consider two-view chest when the patient is stable to exclude a focal infiltrate at the left base.   Original Report Authenticated By: Davonna Belling, M.D.    Dg Chest Port 1 View  06/27/2012  *RADIOLOGY REPORT*  Clinical Data: Chest pain  PORTABLE CHEST -  1 VIEW  Comparison: Pain chest x-ray of 05/30/2012  Findings: There is bibasilar linear atelectasis present.  No definite pneumonia is seen.  Two-view chest x-ray may be helpful to exclude opacity at the left lung base.  Mild cardiomegaly is stable.  The feeding tube extends below the hemidiaphragm.  IMPRESSION: Bibasilar atelectasis.  Consider two-view chest x-ray to exclude pneumonia particularly at the left lung base.   Original Report Authenticated By: Dwyane Dee, M.D.    Dg Abd 2 Views  06/09/2012  *RADIOLOGY REPORT*  Clinical Data: Abdominal distention.  Evaluate stool burden.  ABDOMEN - 2 VIEW  Comparison: Abdominal CT 05/31/2012  Findings: The feeding tube is in the region of the proximal jejunum.  There is contrast throughout the colon with at least mild to moderate stool in the right colon region.  There is gas within the small bowel loops but no significant small bowel gaseous distention.  There is gas and stool in the rectum. Densities at the right lung base could represent atelectasis and cannot exclude pleural fluid.  IMPRESSION: There is oral contrast and stool within the colon.  No evidence for bowel obstruction.  Feeding tube in the jejunum.   Original Report Authenticated By: Richarda Overlie, M.D.    Dg Abd Acute W/chest  05/30/2012  *RADIOLOGY REPORT*  Clinical Data: Abdominal pain and vomiting.  ACUTE ABDOMEN SERIES (ABDOMEN 2 VIEW & CHEST 1 VIEW)  Comparison: 12/05/2008  Findings: Lungs are clear. Heart and mediastinum are within normal limits.  Trachea is midline.  No evidence of free air.  Nonspecific bowel gas pattern.  Surgical clips in the right upper abdomen. Degenerative changes in the lower lumbar spine.  Phleboliths in the pelvis.  IMPRESSION: No acute findings.   Original Report Authenticated By: Richarda Overlie, M.D.    Dg Naso G Tube Plc W/fl-no Rad  06/14/2012  CLINICAL DATA: reposition panda   NASO G TUBE PLACEMENT WITH FLUORO  Fluoroscopy was utilized by the requesting physician.  No  radiographic  interpretation.     Dg Naso G Tube Plc W/fl-no Rad  06/04/2012  CLINICAL DATA:    NASO G TUBE PLACEMENT WITH FLUORO  Fluoroscopy was utilized by the requesting physician.  No radiographic  interpretation.     Mr Abd W/wo Cm/mrcp  06/02/2012  *RADIOLOGY REPORT*  Clinical Data:  Pancreatitis and biliary dilatation.  Elevated liver function tests.  MRI ABDOMEN WITHOUT AND WITH CONTRAST (INCLUDING MRCP)  Technique:  Multiplanar multisequence MR imaging of the abdomen was performed both before and after the administration of intravenous contrast. Heavily T2-weighted images of the biliary and pancreatic ducts were obtained, and three-dimensional MRCP images were rendered by post processing.  Contrast: 20 ml Multihance  Comparison:  05/31/2012  Findings:  Despite efforts by the patient and technologist, motion artifact is present on some series of today's examination and could not be totally eliminated.  This reduces diagnostic sensitivity and specificity.  Trace bilateral pleural effusions noted with mesenteric edema and mild perihepatic and perisplenic ascites.  Edema tracks within and around the pancreas, compatible with pancreatitis.  Gallbladder absent.  The common bile duct measures a maximum of 6 mm on today's examination.  On image 15 of series 3, the portion of the common bile duct adjacent to the pancreatic head is somewhat irregular, and there is an appearance of abrupt truncation of the CBD adjacent to the ampulla on that same image. This abrupt truncation is shown on the T2-weighted images, but on the postcontrast images such as images 66-77 of series 1504, there appears to be a focal stricturing or extrinsic narrowing of the CBD but with a small channel communicating with the ampulla  Abnormal high precontrast T1 signal in the pancreatic body is shown on images 48-68 of series 1500, compatible with hemorrhage. This corresponds to a lack of enhancement in the pancreatic body highly  concerning for pancreatic necrosis, as noted on the prior CT scan.  IMPRESSION: 1.  The common bile duct currently only measures 6 mm in diameter, but there is focal irregular narrowing in the CBD along the pancreatic head, and also focal narrowing in the distal CBD in the vicinity of the ampulla. Loss of the normal conical distal CBD tapering.  T2-weighted images suggest an abrupt truncation of the distal-most CBD as can be seen with obstructing stone, but the postcontrast images seem to demonstrate a small channel connecting through to the ampulla raising the possibility that the distal CBD stenosis may be from stricturing or extrinsic compression related to the underlying pancreatitis.  Differentiation is not helped by the degree of motion artifact. 2.  Pancreatic necrosis of the pancreatic body, with associated hemorrhage in this portion of the pancreas.  3.  Trace bilateral pleural effusions with adjacent atelectasis; mesenteric edema along with perihepatic and perisplenic ascites. Peripancreatic edema pattern with acute pancreatitis.   Original Report Authenticated By: Gaylyn Rong, M.D.    US Abdomen Limited Ruq  05/31/2012  *RADIOLOGY REPORT*  Clinical Data: Pancreatitis.  Elevated LFTs.  LIMITED ABDOMINAL ULTRASOUND  Comparison:  05/31/2012 CT.  Findings:  Gallbladder:  Post cholecystectomy.  Common bile duct:  Only the proximal aspect is well delineated secondary to bowel gas with the common bile duct measuring up to 9.3 mm.  Mid to distal common bile duct stone cannot be excluded based on present exam.  Liver:  Dilated intrahepatic biliary ducts.  Free fluid noted.  Limit evaluation of the pancreas.  Please see recent CT report.  IMPRESSION: Post cholecystectomy.  Dilated proximal common bile duct measuring up to 9.3 mm.   Mid to distal common bile duct not visualized secondary to bowel gas, mid to distal common bile duct stone can not be excluded.  Dilated intrahepatic biliary ducts.   Original  Report Authenticated By: Lacy Duverney, M.D.     GI Procedures:   History/Physical Exam:  See Admission H&P  Admission HPI:   Hospital Course by problem list: Active Problems:   CAD (coronary artery disease) of artery bypass graft   Unspecified protein-calorie malnutrition   SIRS (systemic inflammatory response syndrome)   Acute pancreatitis   Pancreatic abscess   Chest pain epsode 4/17 - relieved with NTG    Discharge Vitals:  BP 149/88  Pulse 108  Temp(Src) 98.5 F (36.9 C) (Oral)  Resp 24  Ht 5\' 7"  (1.702 m)  Wt 177 lb 11.1 oz (80.6 kg)  BMI 27.82  kg/m2  SpO2 95%  Discharge Labs:  Results for orders placed during the hospital encounter of 06/25/12 (from the past 24 hour(s))  GLUCOSE, CAPILLARY     Status: Abnormal   Collection Time    06/27/12 11:57 AM      Result Value Range   Glucose-Capillary 113 (*) 70 - 99 mg/dL  GLUCOSE, CAPILLARY     Status: Abnormal   Collection Time    06/27/12  3:42 PM      Result Value Range   Glucose-Capillary 105 (*) 70 - 99 mg/dL  TROPONIN I     Status: None   Collection Time    06/27/12  8:51 PM      Result Value Range   Troponin I <0.30  <0.30 ng/mL  GLUCOSE, CAPILLARY     Status: None   Collection Time    06/27/12  8:52 PM      Result Value Range   Glucose-Capillary 91  70 - 99 mg/dL  GLUCOSE, CAPILLARY     Status: Abnormal   Collection Time    06/28/12 12:23 AM      Result Value Range   Glucose-Capillary 105 (*) 70 - 99 mg/dL   Comment 1 Documented in Chart     Comment 2 Notify RN    CBC     Status: Abnormal   Collection Time    06/28/12  2:20 AM      Result Value Range   WBC 14.7 (*) 4.0 - 10.5 K/uL   RBC 3.63 (*) 4.22 - 5.81 MIL/uL   Hemoglobin 10.2 (*) 13.0 - 17.0 g/dL   HCT 86.5 (*) 78.4 - 69.6 %   MCV 82.1  78.0 - 100.0 fL   MCH 28.1  26.0 - 34.0 pg   MCHC 34.2  30.0 - 36.0 g/dL   RDW 29.5  28.4 - 13.2 %   Platelets 341  150 - 400 K/uL  COMPREHENSIVE METABOLIC PANEL     Status: Abnormal   Collection Time     06/28/12  2:20 AM      Result Value Range   Sodium 131 (*) 135 - 145 mEq/L   Potassium 3.4 (*) 3.5 - 5.1 mEq/L   Chloride 97  96 - 112 mEq/L   CO2 26  19 - 32 mEq/L   Glucose, Bld 105 (*) 70 - 99 mg/dL   BUN 12  6 - 23 mg/dL   Creatinine, Ser 4.40  0.50 - 1.35 mg/dL   Calcium 8.4  8.4 - 10.2 mg/dL   Total Protein 5.8 (*) 6.0 - 8.3 g/dL   Albumin 1.8 (*) 3.5 - 5.2 g/dL   AST 19  0 - 37 U/L   ALT 24  0 - 53 U/L   Alkaline Phosphatase 98  39 - 117 U/L   Total Bilirubin 0.5  0.3 - 1.2 mg/dL   GFR calc non Af Amer >90  >90 mL/min   GFR calc Af Amer >90  >90 mL/min  TROPONIN I     Status: None   Collection Time    06/28/12  2:20 AM      Result Value Range   Troponin I <0.30  <0.30 ng/mL  GLUCOSE, CAPILLARY     Status: Abnormal   Collection Time    06/28/12  3:56 AM      Result Value Range   Glucose-Capillary 111 (*) 70 - 99 mg/dL   Comment 1 Documented in Chart     Comment 2 Notify RN  TROPONIN I     Status: None   Collection Time    06/28/12  7:45 AM      Result Value Range   Troponin I <0.30  <0.30 ng/mL  GLUCOSE, CAPILLARY     Status: Abnormal   Collection Time    06/28/12  7:54 AM      Result Value Range   Glucose-Capillary 119 (*) 70 - 99 mg/dL    Disposition and follow-up:   Mr.Justin [name] was discharged from [facility] in stable condition.    Follow-up Appointments:  Future Appointments Provider Department Dept Phone   07/09/2012 9:30 AM Ardeth Sportsman, MD [facility], [place] [phone]      Discharge Medications:   Medication List    ASK your doctor about these medications       aspirin 81 MG chewable tablet  Chew 81 mg by mouth every morning.     diclofenac sodium 1 % Gel  Commonly known as:  VOLTAREN  Apply 2 g topically 4 (four) times daily.     DULoxetine 60 MG capsule  Commonly known as:  CYMBALTA  Take 60 mg by mouth every morning.     feeding supplement (JEVITY 1.2 CAL) Liqd  Place 1,000 mLs into feeding  tube every 12 (twelve) hours. 9pm-9am     fentaNYL 50 MCG/HR  Commonly known as:  DURAGESIC - dosed mcg/hr  Place 1 patch (50 mcg total) onto the skin every 3 (three) days.     HYDROcodone-acetaminophen 5-325 MG per tablet  Commonly known as:  NORCO/VICODIN  Take 1 tablet by mouth every 8 (eight) hours as needed for pain.     levofloxacin 500 MG tablet  Commonly known as:  LEVAQUIN  Take 500 mg by mouth every morning.     levothyroxine 100 MCG tablet  Commonly known as:  SYNTHROID, LEVOTHROID  Take 100 mcg by mouth every morning.     lipase/protease/amylase 09811 UNITS Cpep  Commonly known as:  CREON-10/PANCREASE  Take 1 capsule by mouth daily before supper.     metoprolol tartrate 25 MG tablet  Commonly known as:  LOPRESSOR  Take 1 tablet (25 mg total) by mouth 2 (two) times daily.     oxyCODONE 5 MG immediate release tablet  Commonly known as:  Oxy IR/ROXICODONE  Take 1-2 tablets (5-10 mg total) by mouth every 4 (four) hours as needed.     pantoprazole 40 MG tablet  Commonly known as:  PROTONIX  Take 1 tablet (40 mg total) by mouth daily at 6 (six) AM.     polyethylene glycol packet  Commonly known as:  MIRALAX / GLYCOLAX  Take 17 g by mouth every morning.        SignedCristi Loron, JESSICA D. 06/28/2012, 11:10 AM   Abdominal pain actually slightly improved.  Vitals stable. I have personally taken an interval history, reviewed the chart, and examined the patient.  I agree with the extender's note, impression and recommendations.  Barbette Hair. Arlyce Dice, MD, [facility] [phone]

## 2012-06-28 NOTE — Progress Notes (Signed)
Receiving nurse called at [facility] and given report. Conley Rolls, RN

## 2012-06-28 NOTE — Progress Notes (Signed)
CSW assisted patient/family in completing Advance Directives. The patient designated Therapist, art as his primary healthcare agent and son as his secondary agent.   Clinical Social Worker notarized documents and made copies for patient/family. Clinical Social Worker placed copy on shadow chart to be scanned into patient's chart.   Clinical Social Worker encouraged patient/family to contact with any additional questions or concerns.  Unice Bailey, LCSW [facility] Clinical Social Worker cell #: [phone]

## 2012-06-28 NOTE — Progress Notes (Signed)
PULMONARY  / CRITICAL CARE MEDICINE  Name: Justin [name] MRN: [id number] DOB: 1955/12/11    ADMISSION DATE:  06/25/2012 CONSULTATION DATE:  4/16  REFERRING MD :  Chestine Spore, [facility] PRIMARY SERVICE:  GI  CHIEF COMPLAINT:  abd pain  BRIEF PATIENT DESCRIPTION:  57 yo male with recent admit with necrotizing pancreatitis ? 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 + flatus. Otherwise no new complaints  VITAL SIGNS: Temp:  [98.1 F (36.7 C)-101 F (38.3 C)] 98.3 F (36.8 C) (04/18 1200) Pulse Rate:  [90-115] 112 (04/18 1645) Resp:  [18-26] 20 (04/18 1645) BP: (117-165)/(75-108) 151/103 mmHg (04/18 1645) SpO2:  [92 %-97 %] 95 % (04/18 1645) Weight:  [80.6 kg (177 lb 11.1 oz)] 80.6 kg (177 lb 11.1 oz) (04/18 0000) 02 Rx:  4lpm [place] INTAKE / OUTPUT: Intake/Output     04/17 0701 - 04/18 0700 04/18 0701 - 04/19 0700   I.V. (mL/kg) 1900 (23.6) 1200 (14.9)   NG/GT     IV Piggyback 500 200   Total Intake(mL/kg) 2400 (29.8) 1400 (17.4)   Urine (mL/kg/hr) 1750 (0.9) 700 (0.8)   Total Output 1750 700   Net +650 +700        Urine Occurrence 2 x    Stool Occurrence 3 x      PHYSICAL EXAMINATION: General: No distress Neuro: No focal deficits HEENT: WNL Cardiovascular: RRR s M Lungs: clear Abdomen: distended, soft, diffusely tender, NABS Ext: warm, no  edema   LABS:  Recent Labs Lab 06/25/12 1300 06/25/12 1315 06/26/12 0335 06/27/12 0335 06/27/12 2051 06/28/12 0220 06/28/12 0745  HGB 10.2*  --  10.2* 9.2*  --  10.2*  --   WBC 15.0*  --  15.2* 14.9*  --  14.7*  --   PLT 274  --  271 295  --  341  --   NA 131*  --  131* 132*  --  131*  --   K 3.7  --  3.7 3.5  --  3.4*  --   CL 95*  --  95* 98  --  97  --   CO2 29  --  28 26  --  26  --   GLUCOSE 129*  --  198* 103*  --  105*  --   BUN 13  --  11 10  --  12  --   CREATININE 0.73  --  0.76 0.74  --  0.70  --   CALCIUM 8.6  --  8.4 8.2*  --  8.4  --   AST 12  --  15 19  --  19  --   ALT 18  --  19 22  --  24  --   ALKPHOS 126*  --  124* 108  --  98  --   BILITOT 0.5  --  0.4 0.4  --  0.5  --   PROT 6.7  --  6.3 5.5*  --  5.8*  --   ALBUMIN 2.3*  --  2.1* 1.7*  --  1.8*  --   INR 1.18  --   --   --   --   --   --   LATICACIDVEN  --  1.2  --   --   --   --   --   TROPONINI  --   --   --   --  <0.30 <0.30 <0.30    Recent Labs Lab 06/27/12 2052 06/28/12 0023 06/28/12 0356 06/28/12 0754 06/28/12 1111  GLUCAP 91 105* 111* 119* 119*    Imaging: Ct Aspiration  06/27/2012  *RADIOLOGY REPORT*  CT GUIDED ASPIRATION  Date: 06/27/2012  Clinical History: 57 year old male with necrotizing pancreatitis and pancreatic pseudocyst.  Not clinical concern for possible infection.  CT guided aspiration of fluid is requested.  Procedures Performed: 1. CT guided aspiration  Interventional Radiologist:  Sterling Big, MD  Sedation: Moderate (conscious) sedation was used.  1.5 mg Versed, 75 mcg Fentanyl were administered intravenously.  [name] vital signs were monitored continuously by radiology nursing throughout the procedure.  Sedation Time: 10 minutes minutes  PROCEDURE/FINDINGS:   Informed consent was obtained from the patient following explanation of the procedure, risks, benefits and alternatives. The patient understands, agrees and consents for the procedure. All questions  were addressed. A time out was performed.  Maximal barrier sterile technique utilized including caps, mask, sterile gowns, sterile gloves, large sterile drape, hand hygiene, and betadine skin prep.  A planning axial CT scan was performed.  The pancreatic pseudocyst was successfully identified.  An appropriate approach was selected and the skin marked.  Local anesthesia was attained by infiltration of 1% lidocaine.  Using CT fluoroscopic guidance, a 5-French Yueh centesis catheter was advanced into the fluid collection via a transgastric approach.  Approximately 70 ml of foul-smelling opaque yellow/tan fluid was successfully aspirated.  The Yueh centesis catheter was removed.  The patient tolerated the procedure well, there is no immediate complication. The aspirated fluid was sent for culture.  IMPRESSION:  Technically successful CT-guided aspiration of pancreatic pseudocyst / necrotic pancreatic phlegmon.  A total of 70 ml of foul-smelling opaque tan fluid was aspirated and sent for culture.  Signed,  Sterling Big, MD Vascular & Interventional Radiologist [facility]   Original Report Authenticated By: Malachy Moan, M.D.    Dg Chest Port 1 View  06/28/2012  *RADIOLOGY REPORT*  Clinical Data: Follow-up atelectasis  PORTABLE CHEST - 1 VIEW  Comparison: 06/27/2012  Findings: Bibasilar atelectasis persists and is unchanged.  There is no pneumothorax. Slight air bronchograms in the retrocardiac region redemonstrated, and unchanged.  Cardiac enlarged with tortuous aorta.  Feeding tube remains, extending below the hemidiaphragm.  IMPRESSION: Unchanged portable erect radiograph.  Consider two-view chest when the patient is stable to exclude a focal infiltrate at the left base.   Original Report Authenticated By: Davonna Belling, M.D.    Dg Chest Port 1 View  06/27/2012  *RADIOLOGY REPORT*  Clinical Data: Chest pain  PORTABLE CHEST - 1 VIEW  Comparison: Pain chest x-ray of 05/30/2012  Findings: There  is bibasilar linear atelectasis present.  No definite pneumonia is seen.  Two-view chest x-ray may be helpful to exclude opacity at the left lung base.  Mild cardiomegaly is stable.  The feeding tube extends below the hemidiaphragm.  IMPRESSION: Bibasilar atelectasis.  Consider two-view chest x-ray to exclude pneumonia particularly at the left lung base.   Original Report Authenticated By: Dwyane Dee, M.D.     ASSESSMENT / PLAN:  PULMONARY A: Acute hypoxic respiratory failure >> likely ATX, improved P:   -Cont supplemental oxygen to keep SpO2 > 92%   CARDIOVASCULAR A: SIRS 2nd to pancreatitis. Hx of CAD. P: -continue IV fluids -monitor hemodynamics in ICU  RENAL A: No acute issues. P: -monitor renal fx, urine outpt, electrolytes  GASTROINTESTINAL A:  Necrotizing pancreatitis with pancreatic abscess Nutrition. P: -Per GI, and CCS  -If not transferred in next day or so, will re image with CT abd -retry TFs @ 20 cc/hr. Do not adv -Cont protonix for SUP   HEMATOLOGIC A:  Anemia of critical illness. High risk of DVT P: -f/u CBC -Add LMWH to SCDs for DVT prophy until ambulating   INFECTIOUS A: Acute pancreatitis with necrosis and infected pseudocyst. P: - Continue primaxin   ENDOCRINE A: Hx of hypothyroidism. Hyperglycemia. P: -Continue IV synthroid -added SSI 4/17   NEUROLOGIC A:  Pain control inadequate. P: -Shorten PRN interval for morphine   Wife and family updated in detail  CCM X 30 mins  Billy Fischer, MD ; [facility] service Mobile [phone].  After 5:30 PM or weekends, call [phone]

## 2012-06-28 NOTE — Progress Notes (Signed)
Pt report called to [facility] prior to transport receiving nurse Peyton,RN.  Pt transferred to [facility] per [facility] transport. Pt will be going to room 4322. Vital Signs Stable.  Conley Rolls, RN

## 2012-06-28 NOTE — Progress Notes (Signed)
eLink Physician-Brief Progress Note Patient Name: Justin [name] DOB: 1955/09/17 MRN: [id number]  Date of Service  06/28/2012   HPI/Events of Note   Hypokalemia  eICU Interventions  Potassium replaced   Intervention Category Minor Interventions: Electrolytes abnormality - evaluation and management  Jnaya Butrick 06/28/2012, 3:14 AM

## 2012-06-28 NOTE — Progress Notes (Signed)
Dr Arlyce Dice called and verified orders for pt transfer to [facility]. Conley Rolls, RN

## 2012-06-28 NOTE — Progress Notes (Signed)
Subjective: Patient reports less pain, passing flatus. Fevers continue On tube feeds  Objective: Vital signs in last 24 hours: Temp:  [98.1 F (36.7 C)-101 F (38.3 C)] 98.8 F (37.1 C) (04/18 0400) Pulse Rate:  [90-115] 115 (04/18 0600) Resp:  [15-26] 25 (04/18 0600) BP: (99-161)/(54-94) 138/90 mmHg (04/18 0600) SpO2:  [92 %-98 %] 92 % (04/18 0600) Weight:  [177 lb 11.1 oz (80.6 kg)] 177 lb 11.1 oz (80.6 kg) (04/18 0000) Last BM Date: 06/26/12  Intake/Output from previous day: 04/17 0701 - 04/18 0700 In: 2400 [I.V.:1900; IV Piggyback:500] Out: 1750 [Urine:1750] Intake/Output this shift:    Abdomen soft put moderate to severe tenderness with guarding in the epigastrium  Lab Results:   Recent Labs  06/27/12 0335 06/28/12 0220  WBC 14.9* 14.7*  HGB 9.2* 10.2*  HCT 27.1* 29.8*  PLT 295 341   BMET  Recent Labs  06/27/12 0335 06/28/12 0220  NA 132* 131*  K 3.5 3.4*  CL 98 97  CO2 26 26  GLUCOSE 103* 105*  BUN 10 12  CREATININE 0.74 0.70  CALCIUM 8.2* 8.4   PT/INR  Recent Labs  06/25/12 1300  LABPROT 14.8  INR 1.18   ABG No results found for this basename: PHART, PCO2, PO2, HCO3,  in the last 72 hours  Studies/Results: Ct Aspiration  06/27/2012  *RADIOLOGY REPORT*  CT GUIDED ASPIRATION  Date: 06/27/2012  Clinical History: 57 year old male with necrotizing pancreatitis and pancreatic pseudocyst.  Not clinical concern for possible infection.  CT guided aspiration of fluid is requested.  Procedures Performed: 1. CT guided aspiration  Interventional Radiologist:  Sterling Big, MD  Sedation: Moderate (conscious) sedation was used.  1.5 mg Versed, 75 mcg Fentanyl were administered intravenously.  [name] vital signs were monitored continuously by radiology nursing throughout the procedure.  Sedation Time: 10 minutes minutes  PROCEDURE/FINDINGS:   Informed consent was obtained from the patient following explanation of the procedure, risks,  benefits and alternatives. The patient understands, agrees and consents for the procedure. All questions were addressed. A time out was performed.  Maximal barrier sterile technique utilized including caps, mask, sterile gowns, sterile gloves, large sterile drape, hand hygiene, and betadine skin prep.  A planning axial CT scan was performed.  The pancreatic pseudocyst was successfully identified.  An appropriate approach was selected and the skin marked.  Local anesthesia was attained by infiltration of 1% lidocaine.  Using CT fluoroscopic guidance, a 5-French Yueh centesis catheter was advanced into the fluid collection via a transgastric approach.  Approximately 70 ml of foul-smelling opaque yellow/tan fluid was successfully aspirated.  The Yueh centesis catheter was removed.  The patient tolerated the procedure well, there is no immediate complication. The aspirated fluid was sent for culture.  IMPRESSION:  Technically successful CT-guided aspiration of pancreatic pseudocyst / necrotic pancreatic phlegmon.  A total of 70 ml of foul-smelling opaque tan fluid was aspirated and sent for culture.  Signed,  Sterling Big, MD Vascular & Interventional Radiologist [facility]   Original Report Authenticated By: Malachy Moan, M.D.    Dg Chest Port 1 View  06/27/2012  *RADIOLOGY REPORT*  Clinical Data: Chest pain  PORTABLE CHEST - 1 VIEW  Comparison: Pain chest x-ray of 05/30/2012  Findings: There is bibasilar linear atelectasis present.  No definite pneumonia is seen.  Two-view chest x-ray may be helpful to exclude opacity at the left lung base.  Mild cardiomegaly is stable.  The feeding tube extends below the hemidiaphragm.  IMPRESSION:  Bibasilar atelectasis.  Consider two-view chest x-ray to exclude pneumonia particularly at the left lung base.   Original Report Authenticated By: Dwyane Dee, M.D.     Anti-infectives: Anti-infectives   Start     Dose/Rate Route Frequency Ordered Stop    06/25/12 1330  imipenem-cilastatin (PRIMAXIN) 500 mg in sodium chloride 0.9 % 100 mL IVPB     500 mg 200 mL/hr over 30 Minutes Intravenous 4 times per day 06/25/12 1212        Assessment/Plan: s/p * No surgery found * Pancreatitis with pseudocyst  S/p IR drainage.  Cultures pending Transfer to [facility] pending  Continuing supportive care  LOS: 3 days    Coti Burd A 06/28/2012

## 2012-06-30 LAB — CULTURE, ROUTINE-ABSCESS

## 2012-07-01 LAB — CULTURE, BLOOD (ROUTINE X 2): Culture: NO GROWTH

## 2012-07-02 LAB — ANAEROBIC CULTURE: Special Requests: NORMAL

## 2012-07-09 ENCOUNTER — Ambulatory Visit (INDEPENDENT_AMBULATORY_CARE_PROVIDER_SITE_OTHER): Payer: Medicare Other | Admitting: Surgery

## 2013-01-27 ENCOUNTER — Emergency Department (HOSPITAL_COMMUNITY): Payer: Medicare Other

## 2013-01-27 ENCOUNTER — Encounter (HOSPITAL_COMMUNITY): Payer: Self-pay | Admitting: Emergency Medicine

## 2013-01-27 ENCOUNTER — Emergency Department (HOSPITAL_COMMUNITY)
Admission: EM | Admit: 2013-01-27 | Discharge: 2013-01-27 | Discharge status: Against Medical Advice | Payer: Medicare Other | Attending: Emergency Medicine | Admitting: Emergency Medicine

## 2013-01-27 DIAGNOSIS — E079 Disorder of thyroid, unspecified: Secondary | ICD-10-CM | POA: Insufficient documentation

## 2013-01-27 DIAGNOSIS — IMO0001 Reserved for inherently not codable concepts without codable children: Secondary | ICD-10-CM | POA: Insufficient documentation

## 2013-01-27 DIAGNOSIS — I10 Essential (primary) hypertension: Secondary | ICD-10-CM | POA: Insufficient documentation

## 2013-01-27 DIAGNOSIS — Z7982 Long term (current) use of aspirin: Secondary | ICD-10-CM | POA: Insufficient documentation

## 2013-01-27 DIAGNOSIS — Z79899 Other long term (current) drug therapy: Secondary | ICD-10-CM | POA: Insufficient documentation

## 2013-01-27 DIAGNOSIS — K219 Gastro-esophageal reflux disease without esophagitis: Secondary | ICD-10-CM | POA: Insufficient documentation

## 2013-01-27 DIAGNOSIS — R079 Chest pain, unspecified: Secondary | ICD-10-CM

## 2013-01-27 DIAGNOSIS — I252 Old myocardial infarction: Secondary | ICD-10-CM | POA: Insufficient documentation

## 2013-01-27 DIAGNOSIS — R0789 Other chest pain: Secondary | ICD-10-CM | POA: Insufficient documentation

## 2013-01-27 DIAGNOSIS — J45901 Unspecified asthma with (acute) exacerbation: Secondary | ICD-10-CM | POA: Insufficient documentation

## 2013-01-27 DIAGNOSIS — Z8673 Personal history of transient ischemic attack (TIA), and cerebral infarction without residual deficits: Secondary | ICD-10-CM | POA: Insufficient documentation

## 2013-01-27 DIAGNOSIS — R109 Unspecified abdominal pain: Secondary | ICD-10-CM | POA: Insufficient documentation

## 2013-01-27 DIAGNOSIS — Z8701 Personal history of pneumonia (recurrent): Secondary | ICD-10-CM | POA: Insufficient documentation

## 2013-01-27 MED ORDER — ASPIRIN 81 MG PO CHEW: 324.0000 mg | CHEWABLE_TABLET | Freq: Once | ORAL | Status: DC

## 2013-01-27 NOTE — ED Provider Notes (Signed)
CSN: [id number]     Arrival date & time 01/27/13  1317 History   First MD Initiated Contact with Patient 01/27/13 1339     Chief Complaint  Patient presents with  . Abdominal Pain  . Chest Pain   Patient is a 57 y.o. male presenting with chest pain. The history is provided by the patient.  Chest Pain Pain location:  L chest Pain quality: pressure   Pain radiates to:  Does not radiate Pain severity:  Mild Duration:  3 minutes Timing:  Constant Progression:  Resolved Chronicity:  New Context: stress   Relieved by:  None tried Worsened by:  Nothing tried Associated symptoms: shortness of breath   Associated symptoms: no abdominal pain, no fever, not vomiting and no weakness   pt reports having an argument and he started to have left sided chest pressure that lasted 3 minutes and resolved.  He reported mild SOB during that time but resolved No fever/cough/weakness.   He is now at baseline He reports some recent abdominal pain but is similar to prior episodes and not new for him  Past Medical History  Diagnosis Date  . Back pain   . Hypertension   . Asthma   . Thyroid disease   . High cholesterol   . Stroke   . Fibromyalgia   . Myocardial infarct   . GERD (gastroesophageal reflux disease)   . Pancreatitis   . Pneumonia   . Alcoholism    Past Surgical History  Procedure Laterality Date  . Back surgery    . Cholecystectomy  2009  . Finger surgery     Family History  Problem Relation Age of Onset  . Diabetes Mother   . Diabetes Brother   . Diabetes Brother   . Cancer Father     ? stomach   History  Substance Use Topics  . Smoking status: Passive Smoke Exposure - Never Smoker  . Smokeless tobacco: Never Used  . Alcohol Use: No    Review of Systems  Constitutional: Negative for fever.  Respiratory: Positive for shortness of breath.   Cardiovascular: Positive for chest pain.  Gastrointestinal: Negative for vomiting and abdominal pain.  Neurological: Negative  for weakness.  All other systems reviewed and are negative.    Allergies  Amoxicillin and Dilaudid  Home Medications   Current Outpatient Rx  Name  Route  Sig  Dispense  Refill  . aspirin 81 MG chewable tablet   Oral   Chew 81 mg by mouth every morning.         . diclofenac sodium (VOLTAREN) 1 % GEL   Topical   Apply 2 g topically 4 (four) times daily.   1 Tube   0   . DULoxetine (CYMBALTA) 60 MG capsule   Oral   Take 60 mg by mouth every morning.          . fentaNYL (DURAGESIC - DOSED MCG/HR) 50 MCG/HR   Transdermal   Place 1 patch (50 mcg total) onto the skin every 3 (three) days.   5 patch   0   . HYDROcodone-acetaminophen (NORCO/VICODIN) 5-325 MG per tablet   Oral   Take 1 tablet by mouth every 8 (eight) hours as needed for pain.   60 tablet   0   . levothyroxine (SYNTHROID, LEVOTHROID) 100 MCG tablet   Oral   Take 100 mcg by mouth every morning.          . lipase/protease/amylase (CREON-10/PANCREASE) 12000 UNITS CPEP  Oral   Take 1 capsule by mouth daily before supper.         . metoprolol tartrate (LOPRESSOR) 25 MG tablet   Oral   Take 1 tablet (25 mg total) by mouth 2 (two) times daily.   60 tablet   0   . Nutritional Supplements (FEEDING SUPPLEMENT, JEVITY 1.2 CAL,) LIQD   Per Tube   Place 1,000 mLs into feeding tube every 12 (twelve) hours. 9pm-9am         . pantoprazole (PROTONIX) 40 MG tablet   Oral   Take 1 tablet (40 mg total) by mouth daily at 6 (six) AM.   30 tablet   0   . polyethylene glycol (MIRALAX / GLYCOLAX) packet   Oral   Take 17 g by mouth every morning.          BP 134/95  Pulse 101  Temp(Src) 97.6 F (36.4 C) (Oral)  Resp 25  Ht 5\' 7"  (1.702 m)  Wt 150 lb (68.04 kg)  BMI 23.49 kg/m2  SpO2 97% Physical Exam CONSTITUTIONAL: Well developed/well nourished HEAD: Normocephalic/atraumatic EYES: EOMI/PERRL ENMT: Mucous membranes moist NECK: supple no meningeal signs CV: S1/S2 noted, no  murmurs/rubs/gallops noted LUNGS: Lungs are clear to auscultation bilaterally, no apparent distress ABDOMEN: soft, nontender, no rebound or guarding NEURO: Pt is awake/alert, moves all extremitiesx4 EXTREMITIES: pulses normal, full ROM SKIN: warm, color normal PSYCH: no abnormalities of mood noted  ED Course  Procedures (including critical care time)  I advised further workup (pt denied h/o CAD but chart reads he has CAD) He is pain free but I advised at least 3 hrs of monitoring and potential admission for his CP He refused and wants to go home  I discussed risk of death/disability of leaving against medical advice and the patient accepts these risks.  Risks included death/disability. The patient is awake/alet able to make decisions, and not intoxicated Patient discharged against medical advice.   Labs Review Labs Reviewed - No data to display Imaging Review Dg Chest 2 View  01/27/2013   CLINICAL DATA:  Chest pain  EXAM: CHEST  2 VIEW  COMPARISON:  June 28, 2012  FINDINGS: Lungs are clear. Heart size and pulmonary vascularity are normal. No adenopathy. There is slight anterior wedging of a lower thoracic vertebral body. No pneumothorax.  IMPRESSION: Age uncertain mild anterior wedging of a lower thoracic vertebral body. No edema or consolidation.   Electronically Signed   By: Bretta Bang M.D.   On: 01/27/2013 13:51    EKG Interpretation    Date/Time:  Monday January 27 2013 13:23:22 EST Ventricular Rate:  107 PR Interval:  160 QRS Duration: 92 QT Interval:  324 QTC Calculation: 432 R Axis:   43 Text Interpretation:  Sinus tachycardia Otherwise normal ECG When compared with ECG of 27-Jun-2012 19:24, Premature ventricular complexes are no longer Present            MDM   1. Chest pain    Nursing notes including past medical history and social history reviewed and considered in documentation xrays reviewed and considered     Joya Gaskins, MD 01/27/13  [phone]

## 2013-01-27 NOTE — ED Notes (Signed)
Pt abdomen is only hurting "a little" today. Pt states CP was the reason for coming to the ED today.

## 2013-01-27 NOTE — ED Notes (Signed)
Pt states LLQ abdominal pain since last admission for pancreatitis. Pt states that about 20 minutes ago, he experience severe, non radiating left sided chest pain, lasting ~ 3 minutes. Denies other symptoms besides the pain.

## 2013-02-03 ENCOUNTER — Emergency Department (HOSPITAL_COMMUNITY): Payer: Medicare Other

## 2013-02-03 ENCOUNTER — Encounter (HOSPITAL_COMMUNITY): Payer: Self-pay | Admitting: Emergency Medicine

## 2013-02-03 ENCOUNTER — Inpatient Hospital Stay (HOSPITAL_COMMUNITY)
Admission: EM | Admit: 2013-02-03 | Discharge: 2013-02-06 | DRG: 439 | Disposition: A | Discharge status: Home / Self Care | Payer: Medicare Other | Attending: Family Medicine | Admitting: Family Medicine

## 2013-02-03 DIAGNOSIS — Z833 Family history of diabetes mellitus: Secondary | ICD-10-CM

## 2013-02-03 DIAGNOSIS — K861 Other chronic pancreatitis: Secondary | ICD-10-CM | POA: Diagnosis present

## 2013-02-03 DIAGNOSIS — F411 Generalized anxiety disorder: Secondary | ICD-10-CM | POA: Diagnosis present

## 2013-02-03 DIAGNOSIS — G8929 Other chronic pain: Secondary | ICD-10-CM | POA: Diagnosis present

## 2013-02-03 DIAGNOSIS — F3289 Other specified depressive episodes: Secondary | ICD-10-CM

## 2013-02-03 DIAGNOSIS — K859 Acute pancreatitis without necrosis or infection, unspecified: Principal | ICD-10-CM

## 2013-02-03 DIAGNOSIS — E039 Hypothyroidism, unspecified: Secondary | ICD-10-CM | POA: Diagnosis present

## 2013-02-03 DIAGNOSIS — K862 Cyst of pancreas: Secondary | ICD-10-CM

## 2013-02-03 DIAGNOSIS — E7800 Pure hypercholesterolemia, unspecified: Secondary | ICD-10-CM | POA: Diagnosis present

## 2013-02-03 DIAGNOSIS — I10 Essential (primary) hypertension: Secondary | ICD-10-CM | POA: Diagnosis present

## 2013-02-03 DIAGNOSIS — E876 Hypokalemia: Secondary | ICD-10-CM

## 2013-02-03 DIAGNOSIS — D61818 Other pancytopenia: Secondary | ICD-10-CM

## 2013-02-03 DIAGNOSIS — I25810 Atherosclerosis of coronary artery bypass graft(s) without angina pectoris: Secondary | ICD-10-CM

## 2013-02-03 DIAGNOSIS — Z9089 Acquired absence of other organs: Secondary | ICD-10-CM

## 2013-02-03 DIAGNOSIS — M797 Fibromyalgia: Secondary | ICD-10-CM | POA: Diagnosis present

## 2013-02-03 DIAGNOSIS — R945 Abnormal results of liver function studies: Secondary | ICD-10-CM

## 2013-02-03 DIAGNOSIS — K219 Gastro-esophageal reflux disease without esophagitis: Secondary | ICD-10-CM | POA: Diagnosis present

## 2013-02-03 DIAGNOSIS — J45909 Unspecified asthma, uncomplicated: Secondary | ICD-10-CM | POA: Diagnosis present

## 2013-02-03 DIAGNOSIS — M545 Low back pain, unspecified: Secondary | ICD-10-CM | POA: Diagnosis present

## 2013-02-03 DIAGNOSIS — K863 Pseudocyst of pancreas: Secondary | ICD-10-CM

## 2013-02-03 DIAGNOSIS — R7989 Other specified abnormal findings of blood chemistry: Secondary | ICD-10-CM

## 2013-02-03 DIAGNOSIS — Z8673 Personal history of transient ischemic attack (TIA), and cerebral infarction without residual deficits: Secondary | ICD-10-CM

## 2013-02-03 DIAGNOSIS — IMO0001 Reserved for inherently not codable concepts without codable children: Secondary | ICD-10-CM | POA: Diagnosis present

## 2013-02-03 DIAGNOSIS — E785 Hyperlipidemia, unspecified: Secondary | ICD-10-CM | POA: Diagnosis present

## 2013-02-03 DIAGNOSIS — F329 Major depressive disorder, single episode, unspecified: Secondary | ICD-10-CM | POA: Diagnosis present

## 2013-02-03 DIAGNOSIS — F17200 Nicotine dependence, unspecified, uncomplicated: Secondary | ICD-10-CM | POA: Diagnosis present

## 2013-02-03 DIAGNOSIS — I252 Old myocardial infarction: Secondary | ICD-10-CM

## 2013-02-03 HISTORY — DX: Hypothyroidism, unspecified: E03.9

## 2013-02-03 LAB — COMPREHENSIVE METABOLIC PANEL
AST: 67 U/L — ABNORMAL HIGH (ref 0–37)
Albumin: 3.9 g/dL (ref 3.5–5.2)
Chloride: 100 mEq/L (ref 96–112)
Creatinine, Ser: 0.96 mg/dL (ref 0.50–1.35)
Total Bilirubin: 0.8 mg/dL (ref 0.3–1.2)
Total Protein: 7.6 g/dL (ref 6.0–8.3)

## 2013-02-03 LAB — CBC
MCH: 30.7 pg (ref 26.0–34.0)
Platelets: 142 10*3/uL — ABNORMAL LOW (ref 150–400)
RBC: 4.56 MIL/uL (ref 4.22–5.81)
RDW: 15.6 % — ABNORMAL HIGH (ref 11.5–15.5)
WBC: 5.8 10*3/uL (ref 4.0–10.5)

## 2013-02-03 LAB — LIPASE, BLOOD: Lipase: 635 U/L — ABNORMAL HIGH (ref 11–59)

## 2013-02-03 MED ORDER — GABAPENTIN 300 MG PO CAPS
300.0000 mg | ORAL_CAPSULE | Freq: Three times a day (TID) | ORAL | Status: DC
  Administered 2013-02-03 – 2013-02-06 (×10): 300 mg via ORAL
  Filled 2013-02-03 (×10): qty 1

## 2013-02-03 MED ORDER — KCL IN DEXTROSE-NACL 20-5-0.45 MEQ/L-%-% IV SOLN
INTRAVENOUS | Status: DC
  Administered 2013-02-03 – 2013-02-05 (×5): via INTRAVENOUS

## 2013-02-03 MED ORDER — ENOXAPARIN SODIUM 40 MG/0.4ML ~~LOC~~ SOLN
40.0000 mg | SUBCUTANEOUS | Status: DC
  Administered 2013-02-03 – 2013-02-06 (×4): 40 mg via SUBCUTANEOUS
  Filled 2013-02-03 (×4): qty 0.4

## 2013-02-03 MED ORDER — IOHEXOL 300 MG/ML  SOLN
50.0000 mL | Freq: Once | INTRAMUSCULAR | Status: AC | PRN
  Administered 2013-02-03: 50 mL via ORAL

## 2013-02-03 MED ORDER — LIDOCAINE 5 % EX PTCH
2.0000 | MEDICATED_PATCH | CUTANEOUS | Status: DC
  Administered 2013-02-03 – 2013-02-06 (×4): 2 via TRANSDERMAL
  Filled 2013-02-03 (×5): qty 2

## 2013-02-03 MED ORDER — LEVOTHYROXINE SODIUM 100 MCG PO TABS
100.0000 ug | ORAL_TABLET | Freq: Every morning | ORAL | Status: DC
  Administered 2013-02-03 – 2013-02-06 (×4): 100 ug via ORAL
  Filled 2013-02-03 (×4): qty 1

## 2013-02-03 MED ORDER — ALPRAZOLAM 0.5 MG PO TABS
0.5000 mg | ORAL_TABLET | Freq: Two times a day (BID) | ORAL | Status: DC | PRN
  Administered 2013-02-06: 0.5 mg via ORAL
  Filled 2013-02-03: qty 1

## 2013-02-03 MED ORDER — LORATADINE 10 MG PO TABS
10.0000 mg | ORAL_TABLET | Freq: Every day | ORAL | Status: DC
  Administered 2013-02-03 – 2013-02-06 (×4): 10 mg via ORAL
  Filled 2013-02-03 (×4): qty 1

## 2013-02-03 MED ORDER — ALPRAZOLAM 0.5 MG PO TABS
0.5000 mg | ORAL_TABLET | Freq: Every day | ORAL | Status: DC
  Administered 2013-02-03 – 2013-02-05 (×3): 0.5 mg via ORAL
  Filled 2013-02-03 (×3): qty 1

## 2013-02-03 MED ORDER — ONDANSETRON HCL 4 MG PO TABS: 4.0000 mg | ORAL_TABLET | Freq: Four times a day (QID) | ORAL | Status: DC | PRN

## 2013-02-03 MED ORDER — SODIUM CHLORIDE 0.9 % IV BOLUS (SEPSIS)
1000.0000 mL | Freq: Once | INTRAVENOUS | Status: AC
  Administered 2013-02-03: 1000 mL via INTRAVENOUS

## 2013-02-03 MED ORDER — IOHEXOL 300 MG/ML  SOLN
100.0000 mL | Freq: Once | INTRAMUSCULAR | Status: AC | PRN
  Administered 2013-02-03: 100 mL via INTRAVENOUS

## 2013-02-03 MED ORDER — POTASSIUM CHLORIDE 10 MEQ/100ML IV SOLN
10.0000 meq | Freq: Once | INTRAVENOUS | Status: AC
  Administered 2013-02-03: 10 meq via INTRAVENOUS
  Filled 2013-02-03: qty 100

## 2013-02-03 MED ORDER — FENTANYL CITRATE 0.05 MG/ML IJ SOLN
50.0000 ug | INTRAMUSCULAR | Status: DC | PRN
  Administered 2013-02-03 (×7): 50 ug via INTRAVENOUS
  Filled 2013-02-03 (×7): qty 2

## 2013-02-03 MED ORDER — ONDANSETRON HCL 4 MG/2ML IJ SOLN
4.0000 mg | Freq: Once | INTRAMUSCULAR | Status: AC
  Administered 2013-02-03: 4 mg via INTRAVENOUS
  Filled 2013-02-03: qty 2

## 2013-02-03 MED ORDER — ONDANSETRON HCL 4 MG/2ML IJ SOLN: 4.0000 mg | Freq: Three times a day (TID) | INTRAMUSCULAR | Status: DC | PRN

## 2013-02-03 MED ORDER — ONDANSETRON HCL 4 MG/2ML IJ SOLN: 4.0000 mg | Freq: Four times a day (QID) | INTRAMUSCULAR | Status: DC | PRN

## 2013-02-03 MED ORDER — CLONIDINE HCL 0.1 MG/24HR TD PTWK
0.1000 mg | MEDICATED_PATCH | TRANSDERMAL | Status: DC
  Administered 2013-02-03: 0.1 mg via TRANSDERMAL
  Filled 2013-02-03: qty 1

## 2013-02-03 MED ORDER — ENOXAPARIN SODIUM 40 MG/0.4ML ~~LOC~~ SOLN
40.0000 mg | SUBCUTANEOUS | Status: DC
Start: 2013-02-03 — End: 2013-02-03

## 2013-02-03 MED ORDER — ASPIRIN 81 MG PO CHEW
81.0000 mg | CHEWABLE_TABLET | Freq: Every morning | ORAL | Status: DC
  Administered 2013-02-03 – 2013-02-06 (×4): 81 mg via ORAL
  Filled 2013-02-03 (×4): qty 1

## 2013-02-03 MED ORDER — PANTOPRAZOLE SODIUM 40 MG IV SOLR
40.0000 mg | Freq: Two times a day (BID) | INTRAVENOUS | Status: DC
  Administered 2013-02-03 – 2013-02-04 (×2): 40 mg via INTRAVENOUS
  Filled 2013-02-03 (×3): qty 40

## 2013-02-03 MED ORDER — DULOXETINE HCL 60 MG PO CPEP
60.0000 mg | ORAL_CAPSULE | Freq: Every morning | ORAL | Status: DC
  Administered 2013-02-03 – 2013-02-06 (×4): 60 mg via ORAL
  Filled 2013-02-03 (×4): qty 1

## 2013-02-03 MED ORDER — MORPHINE SULFATE 2 MG/ML IJ SOLN
2.0000 mg | INTRAMUSCULAR | Status: DC | PRN
  Administered 2013-02-03 – 2013-02-06 (×14): 2 mg via INTRAVENOUS
  Filled 2013-02-03 (×14): qty 1

## 2013-02-03 MED ORDER — FERROUS SULFATE 325 (65 FE) MG PO TABS
325.0000 mg | ORAL_TABLET | Freq: Every day | ORAL | Status: DC
Start: 2013-02-04 — End: 2013-02-06
  Administered 2013-02-04 – 2013-02-06 (×3): 325 mg via ORAL
  Filled 2013-02-03 (×3): qty 1

## 2013-02-03 MED ORDER — METHOCARBAMOL 500 MG PO TABS
500.0000 mg | ORAL_TABLET | Freq: Three times a day (TID) | ORAL | Status: DC
  Administered 2013-02-03 – 2013-02-06 (×10): 500 mg via ORAL
  Filled 2013-02-03 (×10): qty 1

## 2013-02-03 MED ORDER — SODIUM CHLORIDE 0.9 % IV SOLN
INTRAVENOUS | Status: DC
  Administered 2013-02-03: 150 mL/h via INTRAVENOUS

## 2013-02-03 NOTE — ED Notes (Signed)
Patient c/o abdominal pain for several days.  Patient has history of pancreatitis after having cyst with stent placement in March.  Significant other states that patient has been for several days.  Also c/o N/V.

## 2013-02-03 NOTE — Progress Notes (Signed)
UR chart review completed.  

## 2013-02-03 NOTE — ED Notes (Signed)
Patient refusing treatment; refuses to have IV placed and states he is leaving.  Patient states he doesn't want any help.

## 2013-02-03 NOTE — ED Notes (Signed)
Patient c/o continued pain at 7/10.  Patient medicated per PRN order.

## 2013-02-03 NOTE — H&P (Addendum)
[facility] History and Physical  Justin [name] WUJ:[id number] DOB: May 17, 1955 DOA: 02/03/2013  Referring physician:  Sunnie Nielsen PCP:  [name]., MD   Chief Complaint:  Epigastric pain  HPI:  The patient is a 57 y.o. year-old male with history of hypertension, hyperlipidemia, CAD status post MI, alcoholism, hypothyroidism, pancreatitis, stroke who presents with abdominal pain.  The patient has a complicated past medical history. In April of 2014, he was hospitalized with severe sepsis secondary to pancreatitis and infected pancreatic pseudocysts. He required transfer from [facility] in [place] to [facility] where enterneurology, and had his pancreatic pseudocyst stents removed approximately one month ago.  CT of the abdomen and pelvis at that time demonstrated no residual pancreatic pseudocyst. He had chronic stable extrahepatic ductal dilation. He had been feeling well since that time. He was last at his baseline health approximately one week ago.  Previously he adhered to a very strict nonfat diet, however over the last week he has consumed several hamburgers, pork fat, and other very fatty foods. He started developing epigastric pain which progressed to 10 out of 10 pain last night. It is a sharp pain without radiation to his back.  Eating makes it worse and his vicodin was not helping.  He also has had nausea with vomiting of clear fluid, and copious watery diarrhea more than 5 times a day for the last couple of days. He denies any blood in his vomit or in his stools. He denies any fevers or chills. He is hardly feeling better after coming to the emergency department.  He was treated with antibiotics 1 month ago for a cold.    In the emergency department, his labs were notable for platelets of 142, potassium 3.3, AST 67, ALT 44, lipase 635.  His CT scan of the abdomen and pelvis demonstrated persistent inflammatory changes in the pancreas consistent with  pancreatitis, pseudocyst measuring 2.7 cm in diameter, inflammation extending into the lesser curvature of the stomach with some wall thickening. He also had multiple mildly enlarged lymph nodes in the area which are likely reactive the radiology report. There is some diffuse intrahepatic bile duct dilation stable from the previous CT scan from [facility] in April of this year.  Review of Systems:  General:  Denies fevers, chills, weight loss or gain HEENT:  Denies changes to hearing and vision, rhinorrhea, sinus congestion, sore throat CV:  Had episode of chest pain last Friday, seen in ER.  PULM:  Denies SOB, wheezing, cough.   GI:  Per HPI  GU:  Denies dysuria, frequency, urgency ENDO:  Denies polyuria, polydipsia.   HEME:  Denies hematemesis, blood in stools, melena, abnormal bruising or bleeding.  LYMPH:  Denies lymphadenopathy.   MSK:  Denies arthralgias, myalgias.   DERM:  Denies skin rash or ulcer.   NEURO:  Denies focal numbness, weakness, slurred speech, confusion, facial droop.  PSYCH:  Denies anxiety and depression.    Past Medical History  Diagnosis Date  . Back pain   . Hypertension   . Asthma   . Hypothyroidism   . High cholesterol   . Stroke     right leg weakness  . Fibromyalgia   . Myocardial infarct     cardiologist in town  . GERD (gastroesophageal reflux disease)   . Pancreatitis     with pseudocysts  . Pneumonia   . Alcoholism    Past Surgical History  Procedure Laterality Date  . Back surgery    .  Cholecystectomy  2009  . Finger surgery    . Pancreatic pseudocyst drainage  2014   Social History:  reports that he has been smoking Cigarettes.  He has been smoking about 0.40 packs per day. He has never used smokeless tobacco. He reports that he uses illicit drugs (Marijuana). He reports that he does not drink alcohol. Lives with his wife, he is on disability because of his back surgery.  Uses a cane to ambulate.    Allergies  Allergen Reactions  .  Amoxicillin Hives and Swelling  . Dilaudid [Hydromorphone Hcl] Swelling    Family History  Problem Relation Age of Onset  . Diabetes Mother   . Diabetes Brother   . Diabetes Brother   . Cancer Father     ? stomach  . Pancreatitis Neg Hx      Prior to Admission medications   Medication Sig Start Date End Date Taking? Authorizing Provider  cloNIDine (CATAPRES - DOSED IN MG/24 HR) 0.1 mg/24hr patch Place 0.1 mg onto the skin once a week.   Yes Historical Provider, MD  diclofenac sodium (VOLTAREN) 1 % GEL Apply 2 g topically 4 (four) times daily. Applied to his back and right leg for pain 06/14/12  Yes Tora Kindred York, PA-C  DULoxetine (CYMBALTA) 60 MG capsule Take 60 mg by mouth every morning.    Yes Historical Provider, MD  ferrous sulfate 325 (65 FE) MG tablet Take 325 mg by mouth daily with breakfast.   Yes Historical Provider, MD  fexofenadine (ALLEGRA) 60 MG tablet Take 60 mg by mouth daily.   Yes Historical Provider, MD  gabapentin (NEURONTIN) 300 MG capsule Take 300 mg by mouth 3 (three) times daily.   Yes Historical Provider, MD  HYDROcodone-acetaminophen (NORCO) 10-325 MG per tablet Take 1 tablet by mouth 2 (two) times daily as needed for moderate pain or severe pain.   Yes Historical Provider, MD  levothyroxine (SYNTHROID, LEVOTHROID) 100 MCG tablet Take 100 mcg by mouth every morning.    Yes Historical Provider, MD  lidocaine (LIDODERM) 5 % Place 1 patch onto the skin daily. Remove & Discard patch within 12 hours or as directed by MD   Yes Historical Provider, MD  methocarbamol (ROBAXIN) 500 MG tablet Take 500 mg by mouth 3 (three) times daily.   Yes Historical Provider, MD   Physical Exam: Filed Vitals:   02/03/13 0036 02/03/13 0244 02/03/13 0542  BP: 123/84 140/85 114/75  Pulse: 85 74 71  Temp: 97.6 F (36.4 C)  97.8 F (36.6 C)  TempSrc:   Oral  Resp: 20 18 18   Height: 5\' 7"  (1.702 m)    Weight: 68.04 kg (150 lb)  69.718 kg (153 lb 11.2 oz)  SpO2: 100% 96% 98%      General:  Adult male, NAD  Eyes:  PERRL, anicteric, non-injected.  ENT:  Nares clear.  OP clear, non-erythematous without plaques or exudates.  MMM.  Neck:  Supple without TM or JVD.    Lymph:  No cervical, supraclavicular, or submandibular LAD.  Cardiovascular:  RRR, normal S1, S2, without m/r/g.  2+ pulses, warm extremities  Respiratory:  CTA bilaterally without increased WOB.  Abdomen:  NABS.  Soft, ND, TTP in the epigastric area without rebound or guarding.    Skin:  No rashes or focal lesions.  Musculoskeletal:  Normal bulk and tone.  No LE edema.  Psychiatric:  A & O x 4.  Appropriate affect.  Neurologic:  CN 3-12 intact.  5/5 strength.  Sensation intact.  Labs on Admission:  Basic Metabolic Panel:  Recent Labs Lab 02/03/13 0053  NA 137  K 3.3*  CL 100  CO2 28  GLUCOSE 109*  BUN 16  CREATININE 0.96  CALCIUM 9.3   Liver Function Tests:  Recent Labs Lab 02/03/13 0053  AST 67*  ALT 44  ALKPHOS 107  BILITOT 0.8  PROT 7.6  ALBUMIN 3.9    Recent Labs Lab 02/03/13 0053  LIPASE 635*   No results found for this basename: AMMONIA,  in the last 168 hours CBC:  Recent Labs Lab 02/03/13 0053  WBC 5.8  HGB 14.0  HCT 40.3  MCV 88.4  PLT 142*   Cardiac Enzymes: No results found for this basename: CKTOTAL, CKMB, CKMBINDEX, TROPONINI,  in the last 168 hours  BNP (last 3 results) No results found for this basename: PROBNP,  in the last 8760 hours CBG: No results found for this basename: GLUCAP,  in the last 168 hours  Radiological Exams on Admission: Ct Abdomen Pelvis W Contrast  02/03/2013   CLINICAL DATA:  Left upper quadrant abdominal pain. History of pancreatitis.  EXAM: CT ABDOMEN AND PELVIS WITH CONTRAST  TECHNIQUE: Multidetector CT imaging of the abdomen and pelvis was performed using the standard protocol following bolus administration of intravenous contrast.  CONTRAST:  50mL OMNIPAQUE IOHEXOL 300 MG/ML SOLN, OMNIPAQUE IOHEXOL  300 MG/ML SOLN  COMPARISON:  06/25/2012  FINDINGS: The lung bases are clear.  Surgical absence of the gallbladder with diffuse intrahepatic bile duct dilatation. This appears relatively stable since the previous study and is probably due to postoperative change. There is inflammatory change around the body of the pancreas with inflammatory thickening of the inner curvature of the stomach. There are multiple lymph nodes in the celiac axis, likely reactive. There is a small cystic collection in the body of the pancreas measuring 2.7 cm consistent with a pseudocyst. This represents significant decrease in the size of the pseudo cyst and of the inflammatory reaction since the previous study. There are multiple stones in both kidneys, measuring up to about 4 mm diameter. There is no evidence of ureteral stone and there is no pyelocaliectasis. The spleen, adrenal glands, abdominal aorta, inferior vena cava, and retroperitoneal lymph nodes are unremarkable. The stomach is not abnormally distended. Small bowel and colon are not distended. Diverticula scattered throughout the colon. No free air in the abdomen.  Pelvis: Prostate gland is not enlarged. Bladder wall is not thickened. Diverticulosis of the sigmoid colon without evidence of diverticulitis. The appendix is normal.  IMPRESSION: Persistent inflammatory changes in the pancreas consistent with pancreatitis. There is a pseudo cyst and inflammatory change in around the body of the pancreas with associated thickening of the lesser curvature of the stomach. Findings demonstrate some improvement since previous study. Multiple lymph nodes are likely reactive. Diffuse intrahepatic bile duct dilatation is stable since previous study. Follow-up until resolution of the acute process is recommended to exclude an underlying tumor.   Electronically Signed   By: Burman Nieves M.D.   On: 02/03/2013 03:37    EKG: Independently reviewed.  pending  Assessment/Plan Principal  Problem:   Acute pancreatitis Active Problems:   Fibromyalgia   CAD (coronary artery disease) of artery bypass graft   Hypokalemia   Unspecified hypothyroidism   Depressive disorder, not elsewhere classified   Pancreatic pseudocyst from acute pancreatitis Dx 06/10/2012   Pseudocyst, pancreas  ---  Acute pancreatitis with recurrence of pancreatic pseudocyst, likely triggered by high  fat diet over the last week.  No evidence of gallstones and status post cholecystectomy. Triglycerides from earlier this year were 171, only very mildly elevated and likely not responsible for recurrent pancreatitis. Patient does have history of alcohol abuse, however, he states that he has not used alcohol recently. -  No antibiotics at this time as patient is afebrile, no white blood cell count -  Clear liquid diet -  IV medications for pain control -  Consider gastroenterology consult if not improving clinically, previously seen by [facility] -  Will need close outpatient followup, and probable repeat CT scan in about one week or sooner as needed -  IV fluids  -  When necessary IV fentanyl for now  Vomiting and diarrhea, most likely secondary to acute pancreatitis, however patient was exposed antibiotics approximately one month ago. -  Enteric precautions -  C. difficile PCR  Inflammation/thickening of stomach lining -  Start BID protonix  CAD status post CABG with hypertension and high cholesterol, episode of chest pain last Friday -  Continue aspirin -  Patient was taken off beta blocker and placed on clonidine patch previously -  Please talk to patient about starting statin -  Will need close outpatient followup for possible stress test -  Please review EKG once complete  Hypertension/hyperlipidemia -  Consider initiation of statin -  Monitor heart rate and blood pressure on clonidine  Iron deficiency anemia, hemoglobin stable. Continue iron  supplements Hypothyroidism, stable. Continue Synthroid. Will not check TSH at this time due to acute illness Chronic pain in the low back and right leg, stable. Continue lidocaine patch, gabapentin.  Depression and anxiety, agitated behavior this week. Continue Cymbalta Hypokalemia, likely due to GI losses.  IV KCl repletion  Diet:   Clear liquid diet Access:   PIV IVF:   Yes Proph:   Lovenox  Code Status:  full Family Communication:  spoke with patient and his wife Disposition Plan: Admit to  MedSurg  Time spent: 60 min Renae Fickle [facility] Pager [phone]  If 7PM-7AM, please contact night-coverage [email or web address] Password [facility] 02/03/2013, 10:48 AM

## 2013-02-03 NOTE — ED Provider Notes (Signed)
CSN: [id number]     Arrival date & time 02/03/13  0020 History   First MD Initiated Contact with Patient 02/03/13 [phone]     Chief Complaint  Patient presents with  . Pancreatitis   (Consider location/radiation/quality/duration/timing/severity/associated sxs/prior Treatment) Patient is a 57 y.o. male presenting with abdominal pain. The history is provided by the patient.  Abdominal Pain Pain location:  LUQ Pain quality: sharp   Pain radiates to:  Does not radiate Pain severity:  Severe Onset quality:  Gradual Duration:  2 days Timing:  Constant Progression:  Worsening Chronicity:  Recurrent Context: not alcohol use and not trauma   Relieved by:  Nothing Exacerbated by: Tried morphine and fentanyl patch at home without relief. Associated symptoms: vomiting   Associated symptoms: no chest pain, no chills, no diarrhea, no fever and no shortness of breath    Feels like his pancreatitis with history of same. Previously an alcoholic, denies any recent alcohol use. No new medications.  Past Medical History  Diagnosis Date  . Back pain   . Hypertension   . Asthma   . Thyroid disease   . High cholesterol   . Stroke   . Fibromyalgia   . Myocardial infarct   . GERD (gastroesophageal reflux disease)   . Pancreatitis   . Pneumonia   . Alcoholism    Past Surgical History  Procedure Laterality Date  . Back surgery    . Cholecystectomy  2009  . Finger surgery     Family History  Problem Relation Age of Onset  . Diabetes Mother   . Diabetes Brother   . Diabetes Brother   . Cancer Father     ? stomach   History  Substance Use Topics  . Smoking status: Current Some Day Smoker  . Smokeless tobacco: Never Used  . Alcohol Use: No    Review of Systems  Constitutional: Negative for fever and chills.  Eyes: Negative for pain.  Respiratory: Negative for shortness of breath.   Cardiovascular: Negative for chest pain.  Gastrointestinal: Positive for vomiting and abdominal pain.  Negative for diarrhea.  Genitourinary: Negative for flank pain.  Musculoskeletal: Negative for back pain, neck pain and neck stiffness.  Skin: Negative for rash.  Neurological: Negative for headaches.  All other systems reviewed and are negative.    Allergies  Amoxicillin and Dilaudid  Home Medications   Current Outpatient Rx  Name  Route  Sig  Dispense  Refill  . aspirin 81 MG chewable tablet   Oral   Chew 81 mg by mouth every morning.         . diclofenac sodium (VOLTAREN) 1 % GEL   Topical   Apply 2 g topically 4 (four) times daily.   1 Tube   0   . DULoxetine (CYMBALTA) 60 MG capsule   Oral   Take 60 mg by mouth every morning.          . fentaNYL (DURAGESIC - DOSED MCG/HR) 50 MCG/HR   Transdermal   Place 1 patch (50 mcg total) onto the skin every 3 (three) days.   5 patch   0   . HYDROcodone-acetaminophen (NORCO/VICODIN) 5-325 MG per tablet   Oral   Take 1 tablet by mouth every 8 (eight) hours as needed for pain.   60 tablet   0   . levothyroxine (SYNTHROID, LEVOTHROID) 100 MCG tablet   Oral   Take 100 mcg by mouth every morning.          Marland Kitchen  lipase/protease/amylase (CREON-10/PANCREASE) 12000 UNITS CPEP   Oral   Take 1 capsule by mouth daily before supper.         . metoprolol tartrate (LOPRESSOR) 25 MG tablet   Oral   Take 1 tablet (25 mg total) by mouth 2 (two) times daily.   60 tablet   0   . Nutritional Supplements (FEEDING SUPPLEMENT, JEVITY 1.2 CAL,) LIQD   Per Tube   Place 1,000 mLs into feeding tube every 12 (twelve) hours. 9pm-9am         . pantoprazole (PROTONIX) 40 MG tablet   Oral   Take 1 tablet (40 mg total) by mouth daily at 6 (six) AM.   30 tablet   0   . polyethylene glycol (MIRALAX / GLYCOLAX) packet   Oral   Take 17 g by mouth every morning.          BP 123/84  Pulse 85  Temp(Src) 97.6 F (36.4 C)  Resp 20  Ht 5\' 7"  (1.702 m)  Wt 150 lb (68.04 kg)  BMI 23.49 kg/m2  SpO2 100% Physical Exam   Constitutional: He is oriented to person, place, and time. He appears well-developed and well-nourished.  HENT:  Head: Normocephalic and atraumatic.  Dry mm  Eyes: EOM are normal. Pupils are equal, round, and reactive to light. No scleral icterus.  Neck: Neck supple.  Cardiovascular: Normal rate, regular rhythm and intact distal pulses.   Pulmonary/Chest: Effort normal and breath sounds normal. No respiratory distress.  Abdominal: Soft. He exhibits no distension and no mass.  Tender to palpation left upper quadrant  Musculoskeletal: Normal range of motion. He exhibits no edema.  Neurological: He is alert and oriented to person, place, and time.  Skin: Skin is warm and dry.    ED Course  Procedures (including critical care time) Labs Review Labs Reviewed  CBC - Abnormal; Notable for the following:    RDW 15.6 (*)    Platelets 142 (*)    All other components within normal limits  LIPASE, BLOOD - Abnormal; Notable for the following:    Lipase 635 (*)    All other components within normal limits  COMPREHENSIVE METABOLIC PANEL - Abnormal; Notable for the following:    Potassium 3.3 (*)    Glucose, Bld 109 (*)    AST 67 (*)    All other components within normal limits   Imaging Review Ct Abdomen Pelvis W Contrast  02/03/2013   CLINICAL DATA:  Left upper quadrant abdominal pain. History of pancreatitis.  EXAM: CT ABDOMEN AND PELVIS WITH CONTRAST  TECHNIQUE: Multidetector CT imaging of the abdomen and pelvis was performed using the standard protocol following bolus administration of intravenous contrast.  CONTRAST:  50mL OMNIPAQUE IOHEXOL 300 MG/ML SOLN, OMNIPAQUE IOHEXOL 300 MG/ML SOLN  COMPARISON:  06/25/2012  FINDINGS: The lung bases are clear.  Surgical absence of the gallbladder with diffuse intrahepatic bile duct dilatation. This appears relatively stable since the previous study and is probably due to postoperative change. There is inflammatory change around the body of the  pancreas with inflammatory thickening of the inner curvature of the stomach. There are multiple lymph nodes in the celiac axis, likely reactive. There is a small cystic collection in the body of the pancreas measuring 2.7 cm consistent with a pseudocyst. This represents significant decrease in the size of the pseudo cyst and of the inflammatory reaction since the previous study. There are multiple stones in both kidneys, measuring up to about 4 mm  diameter. There is no evidence of ureteral stone and there is no pyelocaliectasis. The spleen, adrenal glands, abdominal aorta, inferior vena cava, and retroperitoneal lymph nodes are unremarkable. The stomach is not abnormally distended. Small bowel and colon are not distended. Diverticula scattered throughout the colon. No free air in the abdomen.  Pelvis: Prostate gland is not enlarged. Bladder wall is not thickened. Diverticulosis of the sigmoid colon without evidence of diverticulitis. The appendix is normal.  IMPRESSION: Persistent inflammatory changes in the pancreas consistent with pancreatitis. There is a pseudo cyst and inflammatory change in around the body of the pancreas with associated thickening of the lesser curvature of the stomach. Findings demonstrate some improvement since previous study. Multiple lymph nodes are likely reactive. Diffuse intrahepatic bile duct dilatation is stable since previous study. Follow-up until resolution of the acute process is recommended to exclude an underlying tumor.   Electronically Signed   By: Burman Nieves M.D.   On: 02/03/2013 03:37    IV fluids. IV fentanyl. IV Zofran. Labs reviewed and IV potassium provided 4:28 AM CT results shared with patient. He still having abdominal pain with nausea. Repeat medications and medicine consult. Dr. Sharl Ma to admit MDM  Diagnosis: Acute pancreatitis  Evaluated with CT scan and labs reviewed as above. Potassium 3.3. Lipase 635. IV fluids. IV narcotics.  Medical  admission  Sunnie Nielsen, MD 02/03/13 0430

## 2013-02-03 NOTE — ED Notes (Signed)
Attempted IV x 2 without success.  V Rice, RN in to attempt IV placement.

## 2013-02-03 NOTE — ED Notes (Signed)
Patient ambulated to bathroom with assist.  CT tech notified that patient has finished oral contrast.

## 2013-02-03 NOTE — ED Notes (Signed)
Dr. Dierdre Highman made aware that patient is refusing treatment; states he will be in shortly to speak with patient.

## 2013-02-03 NOTE — Care Management Note (Signed)
    Page 1 of 1   02/03/2013     2:04:32 PM   CARE MANAGEMENT NOTE 02/03/2013  Patient:  BENZ, VANDENBERGHE   Account Number:  [id number]  Date Initiated:  02/03/2013  Documentation initiated by:  Sharrie Rothman  Subjective/Objective Assessment:   Pt admitted from home with pancreatitis. Pt lives with his wife and will return home at discharge. Pt is fairly independent with ADL's. Pt has a cane but has requested a new one along with shower stool from [place].     Action/Plan:   Orders faxed to [facility] for DME. Will continue to follow for discharge planning needs.   Anticipated DC Date:  02/05/2013   Anticipated DC Plan:  HOME/SELF CARE      DC Planning Services  CM consult      PAC Choice  DURABLE MEDICAL EQUIPMENT   Choice offered to / List presented to:  C-1 Patient   DME arranged  CANE  SHOWER STOOL      DME agency  [facility]        Status of service:  Completed, signed off Medicare Important Message given?   (If response is "NO", the following Medicare IM given date fields will be blank) Date Medicare IM given:   Date Additional Medicare IM given:    Discharge Disposition:  HOME/SELF CARE  Per UR Regulation:    If discussed at Long Length of Stay Meetings, dates discussed:    Comments:  02/03/13 1405 Arlyss Queen, RN BSN CM

## 2013-02-03 NOTE — ED Notes (Signed)
Patient c/o abdominal pain; patient refuses any further assessment.  Patient states he doesn't want any help; significant other in room telling patient he needs help.  Patient states he is leaving.

## 2013-02-03 NOTE — ED Notes (Signed)
Patient drinking oral contrast for CT scan.  Significant other remains at bedside.

## 2013-02-04 ENCOUNTER — Encounter (HOSPITAL_COMMUNITY): Payer: Self-pay

## 2013-02-04 DIAGNOSIS — F329 Major depressive disorder, single episode, unspecified: Secondary | ICD-10-CM

## 2013-02-04 LAB — DIFFERENTIAL
Basophils Absolute: 0 10*3/uL (ref 0.0–0.1)
Basophils Relative: 0 % (ref 0–1)
Monocytes Relative: 10 % (ref 3–12)
Neutro Abs: 1.1 10*3/uL — ABNORMAL LOW (ref 1.7–7.7)
Neutrophils Relative %: 44 % (ref 43–77)

## 2013-02-04 LAB — CBC
HCT: 36.5 % — ABNORMAL LOW (ref 39.0–52.0)
Hemoglobin: 12 g/dL — ABNORMAL LOW (ref 13.0–17.0)
Platelets: 133 10*3/uL — ABNORMAL LOW (ref 150–400)
RDW: 16.3 % — ABNORMAL HIGH (ref 11.5–15.5)
WBC: 2.4 10*3/uL — ABNORMAL LOW (ref 4.0–10.5)

## 2013-02-04 LAB — BASIC METABOLIC PANEL
BUN: 5 mg/dL — ABNORMAL LOW (ref 6–23)
CO2: 29 mEq/L (ref 19–32)
Calcium: 8.6 mg/dL (ref 8.4–10.5)
GFR calc non Af Amer: >90 mL/min (ref >90)
Glucose, Bld: 108 mg/dL — ABNORMAL HIGH (ref 70–99)
Potassium: 4.4 mEq/L (ref 3.5–5.1)
Sodium: 144 mEq/L (ref 135–145)

## 2013-02-04 LAB — PATHOLOGIST SMEAR REVIEW

## 2013-02-04 MED ORDER — ATORVASTATIN CALCIUM 10 MG PO TABS
10.0000 mg | ORAL_TABLET | Freq: Every day | ORAL | Status: DC
  Administered 2013-02-04 – 2013-02-05 (×2): 10 mg via ORAL
  Filled 2013-02-04 (×3): qty 1

## 2013-02-04 MED ORDER — CLONIDINE HCL 0.1 MG/24HR TD PTWK
0.1000 mg | MEDICATED_PATCH | TRANSDERMAL | Status: DC
  Administered 2013-02-04: 0.1 mg via TRANSDERMAL
  Filled 2013-02-04: qty 1

## 2013-02-04 MED ORDER — POLYETHYLENE GLYCOL 3350 17 G PO PACK
17.0000 g | PACK | Freq: Every day | ORAL | Status: DC | PRN
  Administered 2013-02-04 – 2013-02-06 (×3): 17 g via ORAL
  Filled 2013-02-04 (×3): qty 1

## 2013-02-04 NOTE — Progress Notes (Signed)
[facility] PROGRESS NOTE  Justin [name] AVW:[id number] DOB: 1955-04-28 DOA: 02/03/2013 PCP: [name]., MD  Assessment/Plan  Acute pancreatitis with recurrence of pancreatic pseudocyst, likely triggered by high fat diet over the last week. No evidence of gallstones and status post cholecystectomy. Triglycerides from earlier this year were 171, only very mildly elevated and likely not responsible for recurrent pancreatitis. Patient does have history of alcohol abuse, however, he states that he has not used alcohol recently.  - No antibiotics at this time as patient is afebrile - Advanced to low fat diet - IV medications for pain control  - Have left message for Dr. Shana Chute, [facility], to discuss progression of pancreatic pseudocyst - Will need close outpatient followup, and probable repeat CT scan in about one week or sooner as needed  - Decrease IV fluids  - Continue morphine prn  Vomiting and diarrhea, most likely secondary to acute pancreatitis, resolving.  No BMs since admission.   -  If no BMs after 48 hours, okay to d/c enteric precautions and C. diff  - Enteric precautions  - C. difficile PCR   Inflammation/thickening of stomach lining  - Continue BID protonix   CAD status post CABG with hypertension and high cholesterol, episode of chest pain last Friday  - Continue aspirin  - Patient was taken off beta blocker and placed on clonidine patch previously  - Start low dose atorvastatin, to titrate - Will need close outpatient followup for possible stress test  - Please review EKG once complete   Hypertension/hyperlipidemia  - Start low dose statin  - HR and BP stable on clonidine patch  Iron deficiency anemia, hemoglobin stable. Continue iron supplements   Hypothyroidism, stable. Continue Synthroid. Will not check TSH at this time due to acute illness   Chronic pain in the low back and right leg, stable. Continue lidocaine patch, gabapentin.   Depression and  anxiety, agitated behavior this week. Continue Cymbalta   Hypokalemia, likely due to GI losses and resolved with IV KCl  Pancytopenia,  -  Add on differential and smear - Repeat in AM  Diet: Low fat diet Access: PIV  IVF: Yes  Proph: Lovenox   Code Status: full  Family Communication: spoke with patient and his wife  Disposition Plan: Admit to MedSurg   Consultants:  Dr. Shana Chute, [facility], attempted to contact  Procedures:  CT abd/pelvis 11/24  Antibiotics:  none   HPI/Subjective:  Nausea and vomiting are improving.  Was able to tolerate clear liquids yesterday and would like more solid foods today. Pain scores decreasing.  No diarrhea since yesterday.  Denies fevers and chills.      Objective: Filed Vitals:   02/03/13 1143 02/03/13 1323 02/03/13 2309 02/04/13 0444  BP: 105/65 123/84 146/81 98/66  Pulse:  66 70 73  Temp:  98.1 F (36.7 C) 97.9 F (36.6 C) 97.4 F (36.3 C)  TempSrc:  Oral Oral Oral  Resp:  18 18 18   Height:      Weight:    67.223 kg (148 lb 3.2 oz)  SpO2:  100% 99% 94%    Intake/Output Summary (Last 24 hours) at 02/04/13 0933 Last data filed at 02/04/13 0806  Gross per 24 hour  Intake    480 ml  Output   2900 ml  Net  -2420 ml   Filed Weights   02/03/13 0036 02/03/13 0542 02/04/13 0444  Weight: 68.04 kg (150 lb) 69.718 kg (153 lb 11.2 oz) 67.223 kg (148 lb  3.2 oz)    Exam:   General:  Hispanic male, No acute distress  HEENT:  NCAT, MMM  Cardiovascular:  RRR, nl S1, S2 no mrg, 2+ pulses, warm extremities  Respiratory:  CTAB, no increased WOB  Abdomen:   NABS, soft, nondistended, TTP in the epigastric region and in the right and left upper quadrants without rebound or guarding  MSK:   Normal tone and bulk, no LEE  Neuro:  Grossly intact  Data Reviewed: Basic Metabolic Panel:  Recent Labs Lab 02/03/13 0053 02/04/13 0523  NA 137 144  K 3.3* 4.4  CL 100 110  CO2 28 29  GLUCOSE 109* 108*  BUN 16 5*  CREATININE 0.96  0.88  CALCIUM 9.3 8.6   Liver Function Tests:  Recent Labs Lab 02/03/13 0053  AST 67*  ALT 44  ALKPHOS 107  BILITOT 0.8  PROT 7.6  ALBUMIN 3.9    Recent Labs Lab 02/03/13 0053 02/04/13 0523  LIPASE 635* 284*   No results found for this basename: AMMONIA,  in the last 168 hours CBC:  Recent Labs Lab 02/03/13 0053 02/04/13 0523  WBC 5.8 2.4*  HGB 14.0 12.0*  HCT 40.3 36.5*  MCV 88.4 91.0  PLT 142* 133*   Cardiac Enzymes: No results found for this basename: CKTOTAL, CKMB, CKMBINDEX, TROPONINI,  in the last 168 hours BNP (last 3 results) No results found for this basename: PROBNP,  in the last 8760 hours CBG: No results found for this basename: GLUCAP,  in the last 168 hours  No results found for this or any previous visit (from the past 240 hour(s)).   Studies: Ct Abdomen Pelvis W Contrast  02/03/2013   CLINICAL DATA:  Left upper quadrant abdominal pain. History of pancreatitis.  EXAM: CT ABDOMEN AND PELVIS WITH CONTRAST  TECHNIQUE: Multidetector CT imaging of the abdomen and pelvis was performed using the standard protocol following bolus administration of intravenous contrast.  CONTRAST:  50mL OMNIPAQUE IOHEXOL 300 MG/ML SOLN, OMNIPAQUE IOHEXOL 300 MG/ML SOLN  COMPARISON:  06/25/2012  FINDINGS: The lung bases are clear.  Surgical absence of the gallbladder with diffuse intrahepatic bile duct dilatation. This appears relatively stable since the previous study and is probably due to postoperative change. There is inflammatory change around the body of the pancreas with inflammatory thickening of the inner curvature of the stomach. There are multiple lymph nodes in the celiac axis, likely reactive. There is a small cystic collection in the body of the pancreas measuring 2.7 cm consistent with a pseudocyst. This represents significant decrease in the size of the pseudo cyst and of the inflammatory reaction since the previous study. There are multiple stones in both  kidneys, measuring up to about 4 mm diameter. There is no evidence of ureteral stone and there is no pyelocaliectasis. The spleen, adrenal glands, abdominal aorta, inferior vena cava, and retroperitoneal lymph nodes are unremarkable. The stomach is not abnormally distended. Small bowel and colon are not distended. Diverticula scattered throughout the colon. No free air in the abdomen.  Pelvis: Prostate gland is not enlarged. Bladder wall is not thickened. Diverticulosis of the sigmoid colon without evidence of diverticulitis. The appendix is normal.  IMPRESSION: Persistent inflammatory changes in the pancreas consistent with pancreatitis. There is a pseudo cyst and inflammatory change in around the body of the pancreas with associated thickening of the lesser curvature of the stomach. Findings demonstrate some improvement since previous study. Multiple lymph nodes are likely reactive. Diffuse intrahepatic bile duct dilatation  is stable since previous study. Follow-up until resolution of the acute process is recommended to exclude an underlying tumor.   Electronically Signed   By: Burman Nieves M.D.   On: 02/03/2013 03:37    Scheduled Meds: . ALPRAZolam  0.5 mg Oral QHS  . aspirin  81 mg Oral q morning - 10a  . cloNIDine  0.1 mg Transdermal Weekly  . DULoxetine  60 mg Oral q morning - 10a  . enoxaparin (LOVENOX) injection  40 mg Subcutaneous Q24H  . ferrous sulfate  325 mg Oral Q breakfast  . gabapentin  300 mg Oral TID  . levothyroxine  100 mcg Oral q morning - 10a  . lidocaine  2 patch Transdermal Q24H  . loratadine  10 mg Oral Daily  . methocarbamol  500 mg Oral TID  . pantoprazole (PROTONIX) IV  40 mg Intravenous Q12H   Continuous Infusions: . dextrose 5 % and 0.45 % NaCl with KCl 20 mEq/L 100 mL/hr at 02/04/13 7829    Principal Problem:   Acute pancreatitis Active Problems:   Fibromyalgia   CAD (coronary artery disease) of artery bypass graft   Hypokalemia   Unspecified  hypothyroidism   Depressive disorder, not elsewhere classified   Pancreatic pseudocyst from acute pancreatitis Dx 06/10/2012   Pseudocyst, pancreas    Time spent: 30 min    Emaleigh Guimond, [facility]  [facility] Pager [phone]. If 7PM-7AM, please contact night-coverage at [email or web address], password [facility] 02/04/2013, 9:33 AM  LOS: 1 day

## 2013-02-05 LAB — BASIC METABOLIC PANEL
Calcium: 9 mg/dL (ref 8.4–10.5)
Chloride: 107 mEq/L (ref 96–112)
GFR calc Af Amer: >90 mL/min (ref >90)
GFR calc non Af Amer: >90 mL/min (ref >90)
Glucose, Bld: 87 mg/dL (ref 70–99)
Potassium: 4.1 mEq/L (ref 3.5–5.1)
Sodium: 143 mEq/L (ref 135–145)

## 2013-02-05 LAB — CBC WITH DIFFERENTIAL/PLATELET
Basophils Absolute: 0 10*3/uL (ref 0.0–0.1)
Eosinophils Absolute: 0.1 10*3/uL (ref 0.0–0.7)
Eosinophils Relative: 2 % (ref 0–5)
Hemoglobin: 12.3 g/dL — ABNORMAL LOW (ref 13.0–17.0)
Lymphs Abs: 1.2 10*3/uL (ref 0.7–4.0)
MCV: 91.9 fL (ref 78.0–100.0)
Monocytes Relative: 9 % (ref 3–12)
Neutro Abs: 1.4 10*3/uL — ABNORMAL LOW (ref 1.7–7.7)
Neutrophils Relative %: 49 % (ref 43–77)
Platelets: 135 10*3/uL — ABNORMAL LOW (ref 150–400)
RBC: 4.09 MIL/uL — ABNORMAL LOW (ref 4.22–5.81)
WBC: 3 10*3/uL — ABNORMAL LOW (ref 4.0–10.5)

## 2013-02-05 MED ORDER — PANTOPRAZOLE SODIUM 40 MG PO TBEC
40.0000 mg | DELAYED_RELEASE_TABLET | Freq: Two times a day (BID) | ORAL | Status: DC
  Administered 2013-02-05 – 2013-02-06 (×3): 40 mg via ORAL
  Filled 2013-02-05 (×3): qty 1

## 2013-02-05 NOTE — Progress Notes (Signed)
[facility] PROGRESS NOTE  Justin [name]:[id number] DOB: 01-18-1956 DOA: 02/03/2013 PCP: [name]., MD  Assessment/Plan: 1. Acute pancreatitis with recurrence of pancreatic pseudocyst: Clinically appears resolved. Repeat lipase in the morning. Tolerating diet. Thought to be triggered by high fat diet prior to admission. No evidence of gallstones. Status post cholecystectomy. Triglycerides with only minimal elevation. No history of recent alcohol use. No antibiotics used. MDr. Shana Chute, [facility], has managed pancreatic pseudocyst - Will need close outpatient f/u. 2. Vomiting diarrhea, resolved, secondary to acute pancreatitis. All has resolved. 3. Inflammation, thickening of the stomach lining: Continue Protonix twice daily. 4. History of coronary artery disease, status post CABG, hypertension, hyperlipidemia, continue aspirin. Close outpatient followup suggested. Apparently had an episode of chest pain prior to admission however this was in the context of an argument. Recently taken off beta blockers and placed on clonidine patch. No chest pain during this admission. No signs or symptoms to suggest further evaluation at this point 5. Hyperlipidemia: Continue statin. 6. Heart deficiency anemia: Continue iron supplements. 7. Hypothyroidism: Stable. Continue replacement therapy. 8. Chronic low back pain and right leg pain: Stable. Continue to lidoderm patch and gabapentin. 9. Depression and anxiety: Continue Cymbalta. 10. Pancytopenia: Smear showed mild pancytopenia. Repeat CBC in the morning. Appears stable.   Continue current diet.  Check lipase in the morning.  Likely home 11/27  Pending studies:   None  Code Status: full code DVT prophylaxis: Lovenox Family Communication: none present Disposition Plan: home  [name], MD  [facility]  Pager [phone] If 7PM-7AM, please contact night-coverage at [email or web address], password [facility] 02/05/2013, 5:05 PM  LOS: 2  days   Summary: 57 year old man with history of pancreatitis as well as multiple other comorbidities presented with abdominal pain. April 2014 he was hospitalized with severe sepsis secondary to pancreatitis and infected pseudocyst. He was transferred to [facility] at that time. He had stents placed for pseudocyst which were subsequently removed. CT of the abdomen and pelvis at that time demonstrated no residual pancreatic pseudocyst. He had chronic stable extrahepatic ductal dilation. He had been feeling well since that time. Although he previously adhered to low fat diet, he consumed a lot of fatty foods prior to admission. He was admitted for nausea, vomiting, diarrhea, acute pancreatitis with recurrence of pancreatic pseudocyst.  Consultants:  None  Procedures:   none  Antibiotics:  None  HPI/Subjective: Feels better. Minimal abdominal soreness. No nausea or vomiting. Tolerating diet.  Objective: Filed Vitals:   02/04/13 1420 02/04/13 2328 02/05/13 0618 02/05/13 1436  BP: 104/51 129/75 114/75 117/71  Pulse: 68 62 74 93  Temp: 97.7 F (36.5 C) 98.3 F (36.8 C) 97.5 F (36.4 C) 97.6 F (36.4 C)  TempSrc: Oral Oral Oral Oral  Resp: 18 18 20 20   Height:      Weight:   66.9 kg (147 lb 7.8 oz)   SpO2: 100% 99% 100% 98%    Intake/Output Summary (Last 24 hours) at 02/05/13 1705 Last data filed at 02/05/13 1245  Gross per 24 hour  Intake    960 ml  Output   1400 ml  Net   -440 ml     Filed Weights   02/03/13 0542 02/04/13 0444 02/05/13 0618  Weight: 69.718 kg (153 lb 11.2 oz) 67.223 kg (148 lb 3.2 oz) 66.9 kg (147 lb 7.8 oz)    Exam:   Afebrile, vital signs stable  Cardiovascular: Regular rate and rhythm. No murmur, rub or gallop.  Respiratory:  Clear to auscultation bilaterally, no wheezes, rales or rhonchi. Normal respiratory effort.  Abdomen: Minimal epigastric soreness. Otherwise nontender. Benign exam. Soft. Nondistended.  Data  Reviewed:  Basic metabolic panel unremarkable. No lipase today.  Stable thrombocytopenia, anemia, leukopenia  CT showed multiple abnormalities but overall improved  Scheduled Meds: . ALPRAZolam  0.5 mg Oral QHS  . aspirin  81 mg Oral q morning - 10a  . atorvastatin  10 mg Oral q1800  . cloNIDine  0.1 mg Transdermal Weekly  . DULoxetine  60 mg Oral q morning - 10a  . enoxaparin (LOVENOX) injection  40 mg Subcutaneous Q24H  . ferrous sulfate  325 mg Oral Q breakfast  . gabapentin  300 mg Oral TID  . levothyroxine  100 mcg Oral q morning - 10a  . lidocaine  2 patch Transdermal Q24H  . loratadine  10 mg Oral Daily  . methocarbamol  500 mg Oral TID  . pantoprazole  40 mg Oral BID   Continuous Infusions: . dextrose 5 % and 0.45 % NaCl with KCl 20 mEq/L 75 mL/hr at 02/05/13 7846    Principal Problem:   Acute pancreatitis Active Problems:   Fibromyalgia   CAD (coronary artery disease) of artery bypass graft   Hypokalemia   Unspecified hypothyroidism   Depressive disorder, not elsewhere classified   Pancreatic pseudocyst from acute pancreatitis Dx 06/10/2012   Pseudocyst, pancreas   Time spent 25 minutes

## 2013-02-05 NOTE — Progress Notes (Signed)
The patient is receiving Protonix by the intravenous route.  Based on criteria approved by the Pharmacy and Therapeutics Committee and the Medical Executive Committee, the medication is being converted to the equivalent oral dose form.  These criteria include: -No Active GI bleeding -Able to tolerate diet of full liquids (or better) or tube feeding OR able to tolerate other medications by the oral or enteral route  If you have any questions about this conversion, please contact the Pharmacy Department (ext [phone]).  Thank you.  Justin [name], [facility] 02/05/2013 9:15 AM

## 2013-02-06 DIAGNOSIS — D61818 Other pancytopenia: Secondary | ICD-10-CM

## 2013-02-06 LAB — CBC WITH DIFFERENTIAL/PLATELET
Basophils Relative: 0 % (ref 0–1)
HCT: 37.3 % — ABNORMAL LOW (ref 39.0–52.0)
Hemoglobin: 12.5 g/dL — ABNORMAL LOW (ref 13.0–17.0)
Lymphs Abs: 1.2 10*3/uL (ref 0.7–4.0)
MCHC: 33.5 g/dL (ref 30.0–36.0)
Monocytes Absolute: 0.5 10*3/uL (ref 0.1–1.0)
Monocytes Relative: 15 % — ABNORMAL HIGH (ref 3–12)
Neutro Abs: 1.9 10*3/uL (ref 1.7–7.7)
RBC: 4.14 MIL/uL — ABNORMAL LOW (ref 4.22–5.81)

## 2013-02-06 LAB — BASIC METABOLIC PANEL
BUN: 10 mg/dL (ref 6–23)
Chloride: 107 mEq/L (ref 96–112)
GFR calc Af Amer: >90 mL/min (ref >90)
GFR calc non Af Amer: >90 mL/min (ref >90)
Glucose, Bld: 91 mg/dL (ref 70–99)
Potassium: 3.4 mEq/L — ABNORMAL LOW (ref 3.5–5.1)
Sodium: 143 mEq/L (ref 135–145)

## 2013-02-06 MED ORDER — PANTOPRAZOLE SODIUM 40 MG PO TBEC: 40.0000 mg | DELAYED_RELEASE_TABLET | Freq: Every day | ORAL | Status: DC

## 2013-02-06 NOTE — Progress Notes (Signed)
[facility] PROGRESS NOTE  Justin [name]:[id number] DOB: 02/18/1956 DOA: 02/03/2013 PCP: [name]., Justin [name]  Assessment/Plan: 1. Acute pancreatitis with recurrence of pancreatic pseudocyst: Symptomatically resolved. Repeat lipase now 700s. Tolerating diet without pain. Triggered by high fat diet prior to admission. No evidence of gallstones. Status post cholecystectomy. Triglycerides with only minimal elevation. No history of recent alcohol use. No antibiotics used. Dr. Shana Chute, [facility], has managed pancreatic pseudocyst - Will need close outpatient f/u. 2. Vomiting diarrhea, resolved, secondary to acute pancreatitis. All have resolved. 3. Inflammation, thickening of the stomach lining: Continue Protonix. 4. History of coronary artery disease, status post CABG, hypertension, hyperlipidemia, continue aspirin. Close outpatient followup suggested. Apparently had an episode of chest pain prior to admission however this was in the context of an argument.  No chest pain during this admission. No signs or symptoms to suggest further evaluation at this point. Defer any further evaluations the outpatient setting. 5. Hyperlipidemia: Continue statin. 6. Iron deficiency anemia: Continue iron supplements. 7. Hypothyroidism: Stable. Continue replacement therapy. 8. Chronic low back pain and right leg pain: Stable. Continue to lidoderm patch and gabapentin. 9. Depression and anxiety: Stable. Continue Cymbalta. 10. Pancytopenia: Smear showed mild pancytopenia. Hemoglobin is stable and likely baseline. Modest thrombocytopenia stable, etiology unclear. Leukopenia resolving. Etiology unclear. Followup as an outpatient. No signs or symptoms of infection.  Chart reviewed in detail. Patient had pseudocyst treated at [facility]. CT scan 12/19/2012 showed no evidence of residual pseudocyst and drain was removed. There have been no signs or symptoms to suggest infection at this point. His CT this admission revealed a  small pseudocyst and pancreatic inflammation. Clinically the patient feels well, has no pain and wants to go home. I reviewed all laboratory studies and imaging with patient and wife at bedside. They both desire his discharge today. I discussed the elevated lipase of unclear significance and they understood. They are aware of the small pseudocyst. His wife will coordinate his outpatient followup, PCP 12/1 for repeat laboratory studies and she will also contact [facility] for close followup. We also discussed mild pancytopenia.   Continue low-fat diet.  Home today with close outpatient follow  Pending studies:   None  Code Status: full code DVT prophylaxis: Lovenox Family Communication: none present Disposition Plan: home  Justin [name], Justin [name]  [facility]  Pager [phone] If 7PM-7AM, please contact night-coverage at [email or web address], password [facility] 02/06/2013, 11:37 AM  LOS: 3 days   Summary: 57 year old man with history of pancreatitis as well as multiple other comorbidities presented with abdominal pain. April 2014 he was hospitalized with severe sepsis secondary to pancreatitis and infected pseudocyst. He was transferred to [facility] at that time. He had stents placed for pseudocyst which were subsequently removed. CT of the abdomen and pelvis at that time demonstrated no residual pancreatic pseudocyst. He had chronic stable extrahepatic ductal dilation. He had been feeling well since that time. Although he previously adhered to low fat diet, he consumed a lot of fatty foods prior to admission. He was admitted for nausea, vomiting, diarrhea, acute pancreatitis with recurrence of pancreatic pseudocyst.  Consultants:  None  Procedures:   none  Antibiotics:  None  HPI/Subjective: Feels much better. No abdominal pain. No nausea or vomiting. Tolerated low-fat diet. Really wants to go home.  Objective: Filed Vitals:   02/05/13 0618 02/05/13 1436 02/05/13 2200 02/06/13  0557  BP: 114/75 117/71 130/81 103/68  Pulse: 74 93 71 70  Temp: 97.5 F (36.4 C) 97.6 F (36.4  C) 98.3 F (36.8 C) 97.5 F (36.4 C)  TempSrc: Oral Oral Oral Oral  Resp: 20 20 20 20   Height:      Weight: 66.9 kg (147 lb 7.8 oz)     SpO2: 100% 98% 99% 99%    Intake/Output Summary (Last 24 hours) at 02/06/13 1137 Last data filed at 02/05/13 1740  Gross per 24 hour  Intake 1206.25 ml  Output      0 ml  Net 1206.25 ml     Filed Weights   02/03/13 0542 02/04/13 0444 02/05/13 0618  Weight: 69.718 kg (153 lb 11.2 oz) 67.223 kg (148 lb 3.2 oz) 66.9 kg (147 lb 7.8 oz)    Exam:   Afebrile, vital signs stable  General: Appears calm and comfortable.  Cardiovascular: Regular rate and rhythm. No murmur, rub or gallop.  Respiratory: Clear to auscultation bilaterally. No wheezes, rales or rhonchi. Normal respiratory effort.  Abdomen: Soft, nontender, nondistended. No epigastric pain. Positive bowel sounds.  Data Reviewed:  Potassium 3.4, basic metabolic panel unremarkable  Lipase 787   Pancytopenia noted. White blood cell count trending upwards, and near normal. Modest thrombocytopenia stable. Hemoglobin is stable.  Scheduled Meds: . ALPRAZolam  0.5 mg Oral QHS  . aspirin  81 mg Oral q morning - 10a  . atorvastatin  10 mg Oral q1800  . cloNIDine  0.1 mg Transdermal Weekly  . DULoxetine  60 mg Oral q morning - 10a  . enoxaparin (LOVENOX) injection  40 mg Subcutaneous Q24H  . ferrous sulfate  325 mg Oral Q breakfast  . gabapentin  300 mg Oral TID  . levothyroxine  100 mcg Oral q morning - 10a  . lidocaine  2 patch Transdermal Q24H  . loratadine  10 mg Oral Daily  . methocarbamol  500 mg Oral TID  . pantoprazole  40 mg Oral BID   Continuous Infusions:    Principal Problem:   Acute pancreatitis Active Problems:   Fibromyalgia   CAD (coronary artery disease) of artery bypass graft   Hypokalemia   Unspecified hypothyroidism   Depressive disorder, not elsewhere  classified   Pancreatic pseudocyst from acute pancreatitis Dx 06/10/2012   Pseudocyst, pancreas

## 2013-02-06 NOTE — Discharge Summary (Signed)
Physician Discharge Summary  REBEL WILLCUTT ZOX:[id number] DOB: 02/20/1956 DOA: 02/03/2013  PCP: [name]., MD  Admit date: 02/03/2013 Discharge date: 02/06/2013  Recommendations for Outpatient Follow-up:  1. Followup recurrent acute pancreatitis with recurrent pancreatic pseudocyst 2. Followup mild pancytopenia, resolving  3. followup inflammation, thickening inner curvature of the stomach as clinically indicated  Follow-up Information   Follow up with VYAS,DHRUV B., MD In 4 days.   Specialty:  Internal Medicine   Contact information:   [address] [place] [address] [phone]      Discharge Diagnoses:  1. Acute pancreatitis, recurrent 2. Recurrent pancreatic pseudocyst 3. Pancytopenia  Discharge Condition: Improved  Disposition: Home  Diet recommendation: very low fat diet  Filed Weights   02/03/13 0542 02/04/13 0444 02/05/13 0618  Weight: 69.718 kg (153 lb 11.2 oz) 67.223 kg (148 lb 3.2 oz) 66.9 kg (147 lb 7.8 oz)    History of present illness:  57 year old man with history of pancreatitis as well as multiple other comorbidities presented with abdominal pain. April 2014 he was hospitalized with SIRS secondary to pancreatitis and pseudocyst. He was transferred to [facility] at that time. He had stents placed for pseudocyst which were subsequently removed. CT of the abdomen and pelvis 12/2012 demonstrated no residual pancreatic pseudocyst. He had chronic stable extrahepatic ductal dilation. He had been feeling well since that time. Although he previously adhered to low fat diet, he consumed a lot of fatty foods prior to admission. He was admitted for nausea, vomiting, diarrhea, acute pancreatitis with recurrence of pancreatic pseudocyst.  Hospital Course:  Mr. Guderian was treated with supportive care for acute pancreatitis. Clinically he rapidly improved with resolution of nausea, vomiting, abdominal pain and diarrhea. He tolerated a low-fat diet  without pain. Lipase trended downwards and then has increased on day of discharge. However he feels well and wants to go home. See individual issues below.  1. Acute pancreatitis with recurrence of pancreatic pseudocyst: Symptomatically resolved. Repeat lipase now 700s. Tolerating diet without pain. Triggered by high fat diet prior to admission. No evidence of gallstones. Status post cholecystectomy. Triglycerides with only minimal elevation. No history of recent alcohol use. No antibiotics used. Dr. Shana Chute, [facility], has managed pancreatic pseudocyst - Will need close outpatient f/u. 2. Vomiting diarrhea, resolved, secondary to acute pancreatitis. All have resolved. 3. Inflammation, thickening of the stomach lining: Continue Protonix. 4. History of coronary artery disease, status post CABG, hypertension, hyperlipidemia, continue aspirin. Close outpatient followup suggested. Apparently had an episode of chest pain prior to admission however this was in the context of an argument. No chest pain during this admission. No signs or symptoms to suggest further evaluation at this point. Defer any further evaluations the outpatient setting. 5. Iron deficiency anemia: Continue iron supplements. 6. Hypothyroidism: Stable. Continue replacement therapy. 7. Chronic low back pain and right leg pain: Stable. Continue to lidoderm patch and gabapentin. 8. Depression and anxiety: Stable. Continue Cymbalta. 9. Pancytopenia: Smear showed mild pancytopenia. Hemoglobin is stable and likely baseline. Modest thrombocytopenia stable, etiology unclear. Leukopenia resolving. Etiology unclear. Followup as an outpatient. No signs or symptoms of infection.    Patient had pseudocyst treated at [facility]. CT scan 12/19/2012 showed no evidence of residual pseudocyst and drain was removed. There have been no signs or symptoms to suggest infection at this point. His CT this admission revealed a small pseudocyst and pancreatic inflammation.  Clinically the patient feels well, has no pain and wants to go home. I reviewed all laboratory  studies and imaging with patient and wife at bedside. They both desire his discharge today. I discussed the elevated lipase of unclear significance and they understood that this was a significant increase from 2 days ago. They are aware of the small pseudocyst. His wife will coordinate his outpatient followup, PCP 12/1 for repeat laboratory studies and she will also contact [facility] for close followup. We also discussed mild pancytopenia.   Consultants:  None Procedures:  none Antibiotics:  None  Discharge Instructions  Discharge Orders   Future Orders Complete By Expires   Activity as tolerated - No restrictions  As directed    Discharge instructions  As directed    Comments:     Continue strict very low fat diet. See your primary care physician 12/1 for close followup, repeat testing: Lipase, CBC, complete metabolic panel. Contact [facility] 12/1 for outpatient followup. Call your physician or seek immediate medical attention for fever, recurrent abdominal pain, nausea, vomiting or worsening of condition.       Medication List         aspirin EC 81 MG tablet  Take 81 mg by mouth daily.     cloNIDine 0.1 mg/24hr patch  Commonly known as:  CATAPRES - Dosed in mg/24 hr  Place 0.1 mg onto the skin once a week.     DULoxetine 60 MG capsule  Commonly known as:  CYMBALTA  Take 60 mg by mouth every morning.     ferrous sulfate 325 (65 FE) MG tablet  Take 325 mg by mouth daily with breakfast.     fexofenadine 60 MG tablet  Commonly known as:  ALLEGRA  Take 60 mg by mouth daily.     gabapentin 300 MG capsule  Commonly known as:  NEURONTIN  Take 300 mg by mouth 3 (three) times daily.     HYDROcodone-acetaminophen 10-325 MG per tablet  Commonly known as:  NORCO  Take 1 tablet by mouth 2 (two) times daily as needed for moderate pain or severe pain.     levothyroxine 100 MCG tablet  Commonly  known as:  SYNTHROID, LEVOTHROID  Take 100 mcg by mouth every morning.     lidocaine 5 %  Commonly known as:  LIDODERM  Place 2 patches onto the skin daily. Remove & Discard patch within 12 hours or as directed by MD     methocarbamol 500 MG tablet  Commonly known as:  ROBAXIN  Take 500 mg by mouth 3 (three) times daily.     pantoprazole 40 MG tablet  Commonly known as:  PROTONIX  Take 1 tablet (40 mg total) by mouth daily.       Allergies  Allergen Reactions  . Amoxicillin Hives and Swelling  . Dilaudid [Hydromorphone Hcl] Swelling    The results of significant diagnostics from this hospitalization (including imaging, microbiology, ancillary and laboratory) are listed below for reference.    Significant Diagnostic Studies: Ct Abdomen Pelvis W Contrast  02/03/2013   CLINICAL DATA:  Left upper quadrant abdominal pain. History of pancreatitis.  EXAM: CT ABDOMEN AND PELVIS WITH CONTRAST  TECHNIQUE: Multidetector CT imaging of the abdomen and pelvis was performed using the standard protocol following bolus administration of intravenous contrast.  CONTRAST:  50mL OMNIPAQUE IOHEXOL 300 MG/ML SOLN, OMNIPAQUE IOHEXOL 300 MG/ML SOLN  COMPARISON:  06/25/2012  FINDINGS: The lung bases are clear.  Surgical absence of the gallbladder with diffuse intrahepatic bile duct dilatation. This appears relatively stable since the previous study and is probably due  to postoperative change. There is inflammatory change around the body of the pancreas with inflammatory thickening of the inner curvature of the stomach. There are multiple lymph nodes in the celiac axis, likely reactive. There is a small cystic collection in the body of the pancreas measuring 2.7 cm consistent with a pseudocyst. This represents significant decrease in the size of the pseudo cyst and of the inflammatory reaction since the previous study. There are multiple stones in both kidneys, measuring up to about 4 mm diameter. There is no  evidence of ureteral stone and there is no pyelocaliectasis. The spleen, adrenal glands, abdominal aorta, inferior vena cava, and retroperitoneal lymph nodes are unremarkable. The stomach is not abnormally distended. Small bowel and colon are not distended. Diverticula scattered throughout the colon. No free air in the abdomen.  Pelvis: Prostate gland is not enlarged. Bladder wall is not thickened. Diverticulosis of the sigmoid colon without evidence of diverticulitis. The appendix is normal.  IMPRESSION: Persistent inflammatory changes in the pancreas consistent with pancreatitis. There is a pseudo cyst and inflammatory change in around the body of the pancreas with associated thickening of the lesser curvature of the stomach. Findings demonstrate some improvement since previous study. Multiple lymph nodes are likely reactive. Diffuse intrahepatic bile duct dilatation is stable since previous study. Follow-up until resolution of the acute process is recommended to exclude an underlying tumor.   Electronically Signed   By: Burman Nieves M.D.   On: 02/03/2013 03:37     Labs: Basic Metabolic Panel:  Recent Labs Lab 02/03/13 0053 02/04/13 0523 02/05/13 0518 02/06/13 0539  NA 137 144 143 143  K 3.3* 4.4 4.1 3.4*  CL 100 110 107 107  CO2 28 29 30 28   GLUCOSE 109* 108* 87 91  BUN 16 5* 8 10  CREATININE 0.96 0.88 0.81 0.85  CALCIUM 9.3 8.6 9.0 9.3   Liver Function Tests:  Recent Labs Lab 02/03/13 0053  AST 67*  ALT 44  ALKPHOS 107  BILITOT 0.8  PROT 7.6  ALBUMIN 3.9    Recent Labs Lab 02/03/13 0053 02/04/13 0523 02/06/13 0539  LIPASE 635* 284* 787*   CBC:  Recent Labs Lab 02/03/13 0053 02/04/13 0523 02/05/13 0518 02/06/13 0539  WBC 5.8 2.4* 3.0* 3.7*  NEUTROABS  --  1.1* 1.4* 1.9  HGB 14.0 12.0* 12.3* 12.5*  HCT 40.3 36.5* 37.6* 37.3*  MCV 88.4 91.0 91.9 90.1  PLT 142* 133* 135* 129*    Principal Problem:   Acute pancreatitis Active Problems:   Fibromyalgia    CAD (coronary artery disease) of artery bypass graft   Hypokalemia   Unspecified hypothyroidism   Depressive disorder, not elsewhere classified   Pancreatic pseudocyst from acute pancreatitis Dx 06/10/2012   Pseudocyst, pancreas   Time coordinating discharge: 35 minutes  Signed:  [name], MD [facility] 02/06/2013, 1:26 PM

## 2013-02-06 NOTE — Progress Notes (Signed)
IV removed, site WNL.  Pt given d/c instructions and new prescriptions, pt has already received dose of new prescription, as pharmacies are closed.  Discussed home care with patient and discussed home medications, patient verbalizes understanding, teachback completed. F/U appointment in place, pt states they will keep appointment. Pt is stable at this time. Pt taken to main entrance in wheelchair by staff member.

## 2013-05-11 HISTORY — PX: OTHER SURGICAL HISTORY: SHX169

## 2013-05-30 NOTE — Procedures (Signed)
 LAPAROSCOPY, SURGICAL; EXCISION OF LESION OF PANCREAS (EG, CYST, ADENOMA) Possible Open   Procedure Note   Intraoperative laparoscopic ultrasound conveted to open Intraoperative ultrasound Open - cystgastrostomy HALBERT JESSON 05/30/2013   Pre-op Diagnosis: Cyst and pseudocyst of pancreas [577.2]     Post-op Diagnosis: Cyst and pseudocyst of pancreas [577.2]  Procedure(s): LAPAROSCOPY, SURGICAL; EXCISION OF LESION OF PANCREAS (EG, CYST, ADENOMA) Possible Open   Surgeon(s): Vinie Prude, MD Marsa Doyal Burnet, MD  Assistant(s):   Anesthesia: General  Staff:   Circulator: Ronnald Boas, RN; Gattis Lout, RN; Ellouise Buddy, RN Scrub: Ayesha Saucier, RN; Ronnald Boas, RN Surgical Attendant: Lyndy Public  Estimated Blood Loss: less than 100 mL               Specimens: * No orders in the log *         Drains:  Closed/Suction Drain JP Right Abdomen (Active)     Urethral Catheter Non-latex (Active)     NJ/ND Tube NasoJejunal Corpak 10 french Right nostril (Active)    Vinie Prude   Date: 05/30/2013  Time: 12:39 PM

## 2013-06-21 ENCOUNTER — Encounter (HOSPITAL_COMMUNITY): Payer: Self-pay | Admitting: Emergency Medicine

## 2013-06-21 DIAGNOSIS — I10 Essential (primary) hypertension: Secondary | ICD-10-CM | POA: Insufficient documentation

## 2013-06-21 DIAGNOSIS — Z8673 Personal history of transient ischemic attack (TIA), and cerebral infarction without residual deficits: Secondary | ICD-10-CM | POA: Insufficient documentation

## 2013-06-21 DIAGNOSIS — K219 Gastro-esophageal reflux disease without esophagitis: Secondary | ICD-10-CM | POA: Insufficient documentation

## 2013-06-21 DIAGNOSIS — Z7982 Long term (current) use of aspirin: Secondary | ICD-10-CM | POA: Insufficient documentation

## 2013-06-21 DIAGNOSIS — F1021 Alcohol dependence, in remission: Secondary | ICD-10-CM | POA: Insufficient documentation

## 2013-06-21 DIAGNOSIS — E039 Hypothyroidism, unspecified: Secondary | ICD-10-CM | POA: Insufficient documentation

## 2013-06-21 DIAGNOSIS — I252 Old myocardial infarction: Secondary | ICD-10-CM | POA: Insufficient documentation

## 2013-06-21 DIAGNOSIS — Z8701 Personal history of pneumonia (recurrent): Secondary | ICD-10-CM | POA: Insufficient documentation

## 2013-06-21 DIAGNOSIS — J45909 Unspecified asthma, uncomplicated: Secondary | ICD-10-CM | POA: Insufficient documentation

## 2013-06-21 DIAGNOSIS — R1031 Right lower quadrant pain: Secondary | ICD-10-CM | POA: Insufficient documentation

## 2013-06-21 DIAGNOSIS — R748 Abnormal levels of other serum enzymes: Secondary | ICD-10-CM | POA: Insufficient documentation

## 2013-06-21 DIAGNOSIS — F17200 Nicotine dependence, unspecified, uncomplicated: Secondary | ICD-10-CM | POA: Insufficient documentation

## 2013-06-21 DIAGNOSIS — Z880 Allergy status to penicillin: Secondary | ICD-10-CM | POA: Insufficient documentation

## 2013-06-21 DIAGNOSIS — Z8739 Personal history of other diseases of the musculoskeletal system and connective tissue: Secondary | ICD-10-CM | POA: Insufficient documentation

## 2013-06-21 DIAGNOSIS — Z9089 Acquired absence of other organs: Secondary | ICD-10-CM | POA: Insufficient documentation

## 2013-06-21 DIAGNOSIS — Z79899 Other long term (current) drug therapy: Secondary | ICD-10-CM | POA: Insufficient documentation

## 2013-06-21 DIAGNOSIS — R112 Nausea with vomiting, unspecified: Secondary | ICD-10-CM | POA: Insufficient documentation

## 2013-06-21 NOTE — ED Notes (Signed)
Patient had pancreas surgery about a month ago at [facility]; presents tonight with pain x 1 week.

## 2013-06-22 ENCOUNTER — Emergency Department (HOSPITAL_COMMUNITY)
Admission: EM | Admit: 2013-06-22 | Discharge: 2013-06-22 | Disposition: A | Discharge status: Home / Self Care | Payer: Medicare Other | Attending: Emergency Medicine | Admitting: Emergency Medicine

## 2013-06-22 ENCOUNTER — Emergency Department (HOSPITAL_COMMUNITY): Payer: Medicare Other

## 2013-06-22 DIAGNOSIS — R748 Abnormal levels of other serum enzymes: Secondary | ICD-10-CM

## 2013-06-22 DIAGNOSIS — R109 Unspecified abdominal pain: Secondary | ICD-10-CM

## 2013-06-22 DIAGNOSIS — R112 Nausea with vomiting, unspecified: Secondary | ICD-10-CM

## 2013-06-22 LAB — CBC WITH DIFFERENTIAL/PLATELET
BASOS PCT: 0 % (ref 0–1)
Basophils Absolute: 0 10*3/uL (ref 0.0–0.1)
Eosinophils Absolute: 0.1 10*3/uL (ref 0.0–0.7)
Eosinophils Relative: 3 % (ref 0–5)
HEMATOCRIT: 33 % — AB (ref 39.0–52.0)
Hemoglobin: 11 g/dL — ABNORMAL LOW (ref 13.0–17.0)
Lymphocytes Relative: 28 % (ref 12–46)
Lymphs Abs: 1.2 10*3/uL (ref 0.7–4.0)
MCH: 29.6 pg (ref 26.0–34.0)
MCHC: 33.3 g/dL (ref 30.0–36.0)
MCV: 88.9 fL (ref 78.0–100.0)
MONOS PCT: 9 % (ref 3–12)
Monocytes Absolute: 0.4 10*3/uL (ref 0.1–1.0)
NEUTROS ABS: 2.5 10*3/uL (ref 1.7–7.7)
Neutrophils Relative %: 60 % (ref 43–77)
Platelets: 180 10*3/uL (ref 150–400)
RBC: 3.71 MIL/uL — ABNORMAL LOW (ref 4.22–5.81)
RDW: 14 % (ref 11.5–15.5)
WBC: 4.2 10*3/uL (ref 4.0–10.5)

## 2013-06-22 LAB — LACTIC ACID, PLASMA: Lactic Acid, Venous: 1 mmol/L (ref 0.5–2.2)

## 2013-06-22 LAB — COMPREHENSIVE METABOLIC PANEL
ALK PHOS: 97 U/L (ref 39–117)
ALT: 9 U/L (ref 0–53)
AST: 9 U/L (ref 0–37)
Albumin: 3.4 g/dL — ABNORMAL LOW (ref 3.5–5.2)
BILIRUBIN TOTAL: 0.2 mg/dL — AB (ref 0.3–1.2)
BUN: 9 mg/dL (ref 6–23)
CHLORIDE: 104 meq/L (ref 96–112)
CO2: 30 mEq/L (ref 19–32)
Calcium: 9.1 mg/dL (ref 8.4–10.5)
Creatinine, Ser: 0.9 mg/dL (ref 0.50–1.35)
GLUCOSE: 108 mg/dL — AB (ref 70–99)
POTASSIUM: 3.7 meq/L (ref 3.7–5.3)
Sodium: 143 mEq/L (ref 137–147)
Total Protein: 7.2 g/dL (ref 6.0–8.3)

## 2013-06-22 LAB — URINALYSIS, ROUTINE W REFLEX MICROSCOPIC
Bilirubin Urine: NEGATIVE
Glucose, UA: NEGATIVE mg/dL
Hgb urine dipstick: NEGATIVE
KETONES UR: NEGATIVE mg/dL
LEUKOCYTES UA: NEGATIVE
NITRITE: NEGATIVE
PROTEIN: NEGATIVE mg/dL
Specific Gravity, Urine: 1.02 (ref 1.005–1.030)
UROBILINOGEN UA: 0.2 mg/dL (ref 0.0–1.0)
pH: 5.5 (ref 5.0–8.0)

## 2013-06-22 LAB — AMYLASE: Amylase: 108 U/L — ABNORMAL HIGH (ref 0–105)

## 2013-06-22 LAB — LIPASE, BLOOD: LIPASE: 63 U/L — AB (ref 11–59)

## 2013-06-22 MED ORDER — ONDANSETRON HCL 4 MG/2ML IJ SOLN
4.0000 mg | Freq: Once | INTRAMUSCULAR | Status: AC
  Administered 2013-06-22: 4 mg via INTRAVENOUS
  Filled 2013-06-22: qty 2

## 2013-06-22 MED ORDER — ONDANSETRON HCL 4 MG PO TABS
4.0000 mg | ORAL_TABLET | Freq: Four times a day (QID) | ORAL | Status: DC | PRN
Start: 2013-06-22 — End: 2014-09-29

## 2013-06-22 MED ORDER — OXYCODONE-ACETAMINOPHEN 7.5-325 MG PO TABS: 1.0000 | ORAL_TABLET | ORAL | Status: DC | PRN

## 2013-06-22 MED ORDER — MORPHINE SULFATE 4 MG/ML IJ SOLN
4.0000 mg | Freq: Once | INTRAMUSCULAR | Status: AC
  Administered 2013-06-22: 4 mg via INTRAVENOUS
  Filled 2013-06-22: qty 1

## 2013-06-22 MED ORDER — MORPHINE SULFATE 4 MG/ML IJ SOLN: 4.0000 mg | Freq: Once | INTRAMUSCULAR | Status: DC

## 2013-06-22 MED ORDER — SODIUM CHLORIDE 0.9 % IV SOLN
1000.0000 mL | INTRAVENOUS | Status: DC
  Administered 2013-06-22: 1000 mL via INTRAVENOUS

## 2013-06-22 MED ORDER — MORPHINE SULFATE 2 MG/ML IJ SOLN
INTRAMUSCULAR | Status: AC
  Administered 2013-06-22: 4 mg via INTRAVENOUS
  Filled 2013-06-22: qty 2

## 2013-06-22 MED ORDER — SODIUM CHLORIDE 0.9 % IV SOLN
1000.0000 mL | Freq: Once | INTRAVENOUS | Status: AC
  Administered 2013-06-22: 1000 mL via INTRAVENOUS

## 2013-06-22 NOTE — ED Provider Notes (Signed)
CSN: [id number]     Arrival date & time 06/21/13  2256 History   First MD Initiated Contact with Patient 06/22/13 0256     Chief Complaint  Patient presents with  . Abdominal Pain     (Consider location/radiation/quality/duration/timing/severity/associated sxs/prior Treatment) Patient is a 58 y.o. male presenting with abdominal pain. The history is provided by the patient.  Abdominal Pain He has had pain in the right flank with radiation to the right lower quadrant for about the last 5 days. Pain is severe and he rates it 10/10. It was initially associated nausea and vomiting but that has subsided. There's been no fever, chills, sweats. Nothing makes pain better nothing makes it worse. He denies constipation or diarrhea and denies any urinary difficulties. He is one-month status post pancreas surgery. He denies ethanol use since surgery.  Past Medical History  Diagnosis Date  . Back pain   . Hypertension   . Asthma   . Hypothyroidism   . High cholesterol   . Stroke     right leg weakness  . Fibromyalgia   . Myocardial infarct     cardiologist in town  . GERD (gastroesophageal reflux disease)   . Pancreatitis     with pseudocysts  . Pneumonia   . Alcoholism    Past Surgical History  Procedure Laterality Date  . Back surgery    . Cholecystectomy  2009  . Finger surgery    . Pancreatic pseudocyst drainage  2014   Family History  Problem Relation Age of Onset  . Diabetes Mother   . Diabetes Brother   . Diabetes Brother   . Cancer Father     ? stomach  . Pancreatitis Neg Hx    History  Substance Use Topics  . Smoking status: Current Some Day Smoker -- 0.40 packs/day    Types: Cigarettes  . Smokeless tobacco: Never Used  . Alcohol Use: No     Comment: previously used alcohol and quit 6 years ago    Review of Systems  Gastrointestinal: Positive for abdominal pain.  All other systems reviewed and are negative.     Allergies  Amoxicillin and Dilaudid  Home  Medications   Current Outpatient Rx  Name  Route  Sig  Dispense  Refill  . aspirin EC 81 MG tablet   Oral   Take 81 mg by mouth daily.         . cloNIDine (CATAPRES - DOSED IN MG/24 HR) 0.1 mg/24hr patch   Transdermal   Place 0.1 mg onto the skin once a week.         . DULoxetine (CYMBALTA) 60 MG capsule   Oral   Take 60 mg by mouth every morning.          . ferrous sulfate 325 (65 FE) MG tablet   Oral   Take 325 mg by mouth daily with breakfast.         . fexofenadine (ALLEGRA) 60 MG tablet   Oral   Take 60 mg by mouth daily.         Marland Kitchen gabapentin (NEURONTIN) 300 MG capsule   Oral   Take 300 mg by mouth 3 (three) times daily.         Marland Kitchen HYDROcodone-acetaminophen (NORCO) 10-325 MG per tablet   Oral   Take 1 tablet by mouth 2 (two) times daily as needed for moderate pain or severe pain.         Marland Kitchen levothyroxine (SYNTHROID, LEVOTHROID) 100  MCG tablet   Oral   Take 100 mcg by mouth every morning.          . lidocaine (LIDODERM) 5 %   Transdermal   Place 2 patches onto the skin daily. Remove & Discard patch within 12 hours or as directed by MD         . methocarbamol (ROBAXIN) 500 MG tablet   Oral   Take 500 mg by mouth 3 (three) times daily.         . pantoprazole (PROTONIX) 40 MG tablet   Oral   Take 1 tablet (40 mg total) by mouth daily.   30 tablet   0    BP 100/74  Pulse 82  Temp(Src) 98.2 F (36.8 C) (Oral)  Resp 20  Wt 147 lb (66.679 kg)  SpO2 100% Physical Exam  Nursing note and vitals reviewed.  58 year old male, who appears uncomfortable and in pain, but has in no acute distress. Vital signs are normal. Oxygen saturation is 100%, which is normal. Head is normocephalic and atraumatic. PERRLA, EOMI. Oropharynx is clear. Neck is nontender and supple without adenopathy or JVD. Back is nontender in the midline. There is mild right CVA tenderness. Lungs are clear without rales, wheezes, or rhonchi. Chest is nontender. Heart has  regular rate and rhythm without murmur. Abdomen is soft. Surgical scar is present in the midline and healing well without signs of infection. Abdomen is diffusely tender without rebound or guarding. There are no masses or hepatosplenomegaly and peristalsis is normoactive. Extremities have no cyanosis or edema, full range of motion is present. Skin is warm and dry without rash. Neurologic: Mental status is normal, cranial nerves are intact, there are no motor or sensory deficits.  ED Course  Procedures (including critical care time) Labs Review Results for orders placed during the hospital encounter of 06/22/13  AMYLASE      Result Value Ref Range   Amylase 108 (*) 0 - 105 U/L  CBC WITH DIFFERENTIAL      Result Value Ref Range   WBC 4.2  4.0 - 10.5 K/uL   RBC 3.71 (*) 4.22 - 5.81 MIL/uL   Hemoglobin 11.0 (*) 13.0 - 17.0 g/dL   HCT 59.9 (*) 35.7 - 01.7 %   MCV 88.9  78.0 - 100.0 fL   MCH 29.6  26.0 - 34.0 pg   MCHC 33.3  30.0 - 36.0 g/dL   RDW 79.3  90.3 - 00.9 %   Platelets 180  150 - 400 K/uL   Neutrophils Relative % 60  43 - 77 %   Neutro Abs 2.5  1.7 - 7.7 K/uL   Lymphocytes Relative 28  12 - 46 %   Lymphs Abs 1.2  0.7 - 4.0 K/uL   Monocytes Relative 9  3 - 12 %   Monocytes Absolute 0.4  0.1 - 1.0 K/uL   Eosinophils Relative 3  0 - 5 %   Eosinophils Absolute 0.1  0.0 - 0.7 K/uL   Basophils Relative 0  0 - 1 %   Basophils Absolute 0.0  0.0 - 0.1 K/uL  COMPREHENSIVE METABOLIC PANEL      Result Value Ref Range   Sodium 143  137 - 147 mEq/L   Potassium 3.7  3.7 - 5.3 mEq/L   Chloride 104  96 - 112 mEq/L   CO2 30  19 - 32 mEq/L   Glucose, Bld 108 (*) 70 - 99 mg/dL   BUN 9  6 -  23 mg/dL   Creatinine, Ser 2.95  0.50 - 1.35 mg/dL   Calcium 9.1  8.4 - 28.4 mg/dL   Total Protein 7.2  6.0 - 8.3 g/dL   Albumin 3.4 (*) 3.5 - 5.2 g/dL   AST 9  0 - 37 U/L   ALT 9  0 - 53 U/L   Alkaline Phosphatase 97  39 - 117 U/L   Total Bilirubin 0.2 (*) 0.3 - 1.2 mg/dL   GFR calc non Af Amer  >90  >90 mL/min   GFR calc Af Amer >90  >90 mL/min  URINALYSIS, ROUTINE W REFLEX MICROSCOPIC      Result Value Ref Range   Color, Urine YELLOW  YELLOW   APPearance CLEAR  CLEAR   Specific Gravity, Urine 1.020  1.005 - 1.030   pH 5.5  5.0 - 8.0   Glucose, UA NEGATIVE  NEGATIVE mg/dL   Hgb urine dipstick NEGATIVE  NEGATIVE   Bilirubin Urine NEGATIVE  NEGATIVE   Ketones, ur NEGATIVE  NEGATIVE mg/dL   Protein, ur NEGATIVE  NEGATIVE mg/dL   Urobilinogen, UA 0.2  0.0 - 1.0 mg/dL   Nitrite NEGATIVE  NEGATIVE   Leukocytes, UA NEGATIVE  NEGATIVE  LIPASE, BLOOD      Result Value Ref Range   Lipase 63 (*) 11 - 59 U/L  LACTIC ACID, PLASMA      Result Value Ref Range   Lactic Acid, Venous 1.0  0.5 - 2.2 mmol/L   Imaging Review Ct Abdomen Pelvis Wo Contrast  06/22/2013   CLINICAL DATA:  Bilateral flank pain. Lower mid back pain. Abdominal pain. Pancreas surgery about a month ago at [facility]. Pain for 1 week.  EXAM: CT ABDOMEN AND PELVIS WITHOUT CONTRAST  TECHNIQUE: Multidetector CT imaging of the abdomen and pelvis was performed following the standard protocol without intravenous contrast.  COMPARISON:  02/03/2013  FINDINGS: The lung bases are clear. Interval postoperative changes with infiltration and scarring along the midline upper abdominal wall. There is loss of distinction of the peripancreatic fat with infiltration in the upper mesentery and epigastric region. Changes may be postoperative but the appearance is consistent with acute pancreatitis. Small residual low-attenuation cyst or fluid collection adjacent to the body of the pancreas, now measuring 11 mm diameter. No new loculated fluid collections are identified. No free intra-abdominal air.  Surgical absence of the gallbladder. Multiple nonobstructing stones in both kidneys. Unenhanced appearance of the liver, spleen, adrenal glands, abdominal aorta, and inferior vena cava are unremarkable. No significant lymphadenopathy. Gastric wall does not of  year thickened. The small and large bowel are not distended.  Pelvis: Diverticulosis of the sigmoid colon without diverticulitis. Bladder is decompressed. Prostate gland is not enlarged. No free or loculated pelvic fluid collections. Appendix is normal. Degenerative changes in the spine.  IMPRESSION: Infiltrative changes around the peripancreatic and upper epigastric region are nonspecific but suggest pancreatitis. Small residual cyst or collection adjacent to the body of the pancreas. Nonobstructing renal stones.   Electronically Signed   By: Burman Nieves M.D.   On: 06/22/2013 04:07     MDM   Final diagnoses:  Abdominal pain  Nausea & vomiting  Elevated lipase    Right flank and abdominal pain. This pattern is worrisome for possible ureteral colic. This also could be related to his postoperative state. Old records are reviewed and I have reviewed a CT scan that he had last November and there were bilateral renal calculi. I strongly suspect that he has a  ureteral calculus. You'll be sent for CT of abdomen and pelvis without contrast. In the meantime, he is given IV fluids, IV morphine, and IV ondansetron.  He got partial relief of pain with the above-noted treatment and is given additional morphine with better relief of pain. He states pain is adequately controlled to where he would be comfortable at home. CT shows evidence of pancreatitis. Nephrolithiasis is present but no ureterolithiasis. He is discharged with prescriptions for oxycodone-acetaminophen, and ondansetron.  Dione Booze, MD 06/22/13 [phone]

## 2013-06-22 NOTE — ED Notes (Signed)
Patient's significant other states that he was given Roxicodone, but it is too expensive.

## 2013-06-22 NOTE — ED Notes (Signed)
Dr. Glick at bedside to reassess. 

## 2013-06-22 NOTE — Discharge Instructions (Signed)
Return if pain is not being adequately controlled at home. Follow up with the physicans at [facility].  Ondansetron tablets What is this medicine? ONDANSETRON (on DAN se tron) is used to treat nausea and vomiting caused by chemotherapy. It is also used to prevent or treat nausea and vomiting after surgery. This medicine may be used for other purposes; ask your health care provider or pharmacist if you have questions. COMMON BRAND NAME(S): Zofran What should I tell my health care provider before I take this medicine? They need to know if you have any of these conditions: -heart disease -history of irregular heartbeat -liver disease -low levels of magnesium or potassium in the blood -an unusual or allergic reaction to ondansetron, granisetron, other medicines, foods, dyes, or preservatives -pregnant or trying to get pregnant -breast-feeding How should I use this medicine? Take this medicine by mouth with a glass of water. Follow the directions on your prescription label. Take your doses at regular intervals. Do not take your medicine more often than directed. Talk to your pediatrician regarding the use of this medicine in children. Special care may be needed. Overdosage: If you think you have taken too much of this medicine contact a poison control center or emergency room at once. NOTE: This medicine is only for you. Do not share this medicine with others. What if I miss a dose? If you miss a dose, take it as soon as you can. If it is almost time for your next dose, take only that dose. Do not take double or extra doses. What may interact with this medicine? Do not take this medicine with any of the following medications: -apomorphine -certain medicines for fungal infections like fluconazole, itraconazole, ketoconazole, posaconazole, voriconazole -cisapride -dofetilide -dronedarone -pimozide -thioridazine -ziprasidone  This medicine may also interact with the following  medications: -carbamazepine -certain medicines for depression, anxiety, or psychotic disturbances -fentanyl -linezolid -MAOIs like Carbex, Eldepryl, Marplan, Nardil, and Parnate -methylene blue (injected into a vein) -other medicines that prolong the QT interval (cause an abnormal heart rhythm) -phenytoin -rifampicin -tramadol This list may not describe all possible interactions. Give your health care provider a list of all the medicines, herbs, non-prescription drugs, or dietary supplements you use. Also tell them if you smoke, drink alcohol, or use illegal drugs. Some items may interact with your medicine. What should I watch for while using this medicine? Check with your doctor or health care professional right away if you have any sign of an allergic reaction. What side effects may I notice from receiving this medicine? Side effects that you should report to your doctor or health care professional as soon as possible: -allergic reactions like skin rash, itching or hives, swelling of the face, lips or tongue -breathing problems -confusion -dizziness -fast or irregular heartbeat -feeling faint or lightheaded, falls -fever and chills -loss of balance or coordination -seizures -sweating -swelling of the hands or feet -tightness in the chest -tremors -unusually weak or tired Side effects that usually do not require medical attention (report to your doctor or health care professional if they continue or are bothersome): -constipation or diarrhea -headache This list may not describe all possible side effects. Call your doctor for medical advice about side effects. You may report side effects to FDA at 1-800-FDA-1088. Where should I keep my medicine? Keep out of the reach of children. Store between 2 and 30 degrees C (36 and 86 degrees F). Throw away any unused medicine after the expiration date. NOTE: This sheet is  a summary. It may not cover all possible information. If you have  questions about this medicine, talk to your doctor, pharmacist, or health care provider.  2014, Elsevier/Gold Standard. (2012-12-04 16:27:45)  Acetaminophen; Oxycodone tablets What is this medicine? ACETAMINOPHEN; OXYCODONE (a set a MEE noe fen; ox i KOE done) is a pain reliever. It is used to treat mild to moderate pain. This medicine may be used for other purposes; ask your health care provider or pharmacist if you have questions. COMMON BRAND NAME(S): Endocet, Magnacet, Narvox, Percocet, Perloxx, Primalev, Primlev, Roxicet, Xolox What should I tell my health care provider before I take this medicine? They need to know if you have any of these conditions: -brain tumor -Crohn's disease, inflammatory bowel disease, or ulcerative colitis -drug abuse or addiction -head injury -heart or circulation problems -if you often drink alcohol -kidney disease or problems going to the bathroom -liver disease -lung disease, asthma, or breathing problems -an unusual or allergic reaction to acetaminophen, oxycodone, other opioid analgesics, other medicines, foods, dyes, or preservatives -pregnant or trying to get pregnant -breast-feeding How should I use this medicine? Take this medicine by mouth with a full glass of water. Follow the directions on the prescription label. Take your medicine at regular intervals. Do not take your medicine more often than directed. Talk to your pediatrician regarding the use of this medicine in children. Special care may be needed. Patients over 28 years old may have a stronger reaction and need a smaller dose. Overdosage: If you think you have taken too much of this medicine contact a poison control center or emergency room at once. NOTE: This medicine is only for you. Do not share this medicine with others. What if I miss a dose? If you miss a dose, take it as soon as you can. If it is almost time for your next dose, take only that dose. Do not take double or extra  doses. What may interact with this medicine? -alcohol -antihistamines -barbiturates like amobarbital, butalbital, butabarbital, methohexital, pentobarbital, phenobarbital, thiopental, and secobarbital -benztropine -drugs for bladder problems like solifenacin, trospium, oxybutynin, tolterodine, hyoscyamine, and methscopolamine -drugs for breathing problems like ipratropium and tiotropium -drugs for certain stomach or intestine problems like propantheline, homatropine methylbromide, glycopyrrolate, atropine, belladonna, and dicyclomine -general anesthetics like etomidate, ketamine, nitrous oxide, propofol, desflurane, enflurane, halothane, isoflurane, and sevoflurane -medicines for depression, anxiety, or psychotic disturbances -medicines for sleep -muscle relaxants -naltrexone -narcotic medicines (opiates) for pain -phenothiazines like perphenazine, thioridazine, chlorpromazine, mesoridazine, fluphenazine, prochlorperazine, promazine, and trifluoperazine -scopolamine -tramadol -trihexyphenidyl This list may not describe all possible interactions. Give your health care provider a list of all the medicines, herbs, non-prescription drugs, or dietary supplements you use. Also tell them if you smoke, drink alcohol, or use illegal drugs. Some items may interact with your medicine. What should I watch for while using this medicine? Tell your doctor or health care professional if your pain does not go away, if it gets worse, or if you have new or a different type of pain. You may develop tolerance to the medicine. Tolerance means that you will need a higher dose of the medication for pain relief. Tolerance is normal and is expected if you take this medicine for a long time. Do not suddenly stop taking your medicine because you may develop a severe reaction. Your body becomes used to the medicine. This does NOT mean you are addicted. Addiction is a behavior related to getting and using a drug for a  non-medical reason. If you  have pain, you have a medical reason to take pain medicine. Your doctor will tell you how much medicine to take. If your doctor wants you to stop the medicine, the dose will be slowly lowered over time to avoid any side effects. You may get drowsy or dizzy. Do not drive, use machinery, or do anything that needs mental alertness until you know how this medicine affects you. Do not stand or sit up quickly, especially if you are an older patient. This reduces the risk of dizzy or fainting spells. Alcohol may interfere with the effect of this medicine. Avoid alcoholic drinks. There are different types of narcotic medicines (opiates) for pain. If you take more than one type at the same time, you may have more side effects. Give your health care provider a list of all medicines you use. Your doctor will tell you how much medicine to take. Do not take more medicine than directed. Call emergency for help if you have problems breathing. The medicine will cause constipation. Try to have a bowel movement at least every 2 to 3 days. If you do not have a bowel movement for 3 days, call your doctor or health care professional. Do not take Tylenol (acetaminophen) or medicines that have acetaminophen with this medicine. Too much acetaminophen can be very dangerous. Many nonprescription medicines contain acetaminophen. Always read the labels carefully to avoid taking more acetaminophen. What side effects may I notice from receiving this medicine? Side effects that you should report to your doctor or health care professional as soon as possible: -allergic reactions like skin rash, itching or hives, swelling of the face, lips, or tongue -breathing difficulties, wheezing -confusion -light headedness or fainting spells -severe stomach pain -unusually weak or tired -yellowing of the skin or the whites of the eyes  Side effects that usually do not require medical attention (report to your doctor or  health care professional if they continue or are bothersome): -dizziness -drowsiness -nausea -vomiting This list may not describe all possible side effects. Call your doctor for medical advice about side effects. You may report side effects to FDA at 1-800-FDA-1088. Where should I keep my medicine? Keep out of the reach of children. This medicine can be abused. Keep your medicine in a safe place to protect it from theft. Do not share this medicine with anyone. Selling or giving away this medicine is dangerous and against the law. Store at room temperature between 20 and 25 degrees C (68 and 77 degrees F). Keep container tightly closed. Protect from light. This medicine may cause accidental overdose and death if it is taken by other adults, children, or pets. Flush any unused medicine down the toilet to reduce the chance of harm. Do not use the medicine after the expiration date. NOTE: This sheet is a summary. It may not cover all possible information. If you have questions about this medicine, talk to your doctor, pharmacist, or health care provider.  2014, Elsevier/Gold Standard. (2012-10-21 13:17:35)

## 2014-09-29 ENCOUNTER — Emergency Department (HOSPITAL_COMMUNITY)
Admission: EM | Admit: 2014-09-29 | Discharge: 2014-09-29 | Disposition: A | Discharge status: Home / Self Care | Payer: Medicare Other | Attending: Emergency Medicine | Admitting: Emergency Medicine

## 2014-09-29 ENCOUNTER — Encounter (HOSPITAL_COMMUNITY): Payer: Self-pay | Admitting: Emergency Medicine

## 2014-09-29 ENCOUNTER — Emergency Department (HOSPITAL_COMMUNITY): Payer: Medicare Other

## 2014-09-29 DIAGNOSIS — Z720 Tobacco use: Secondary | ICD-10-CM | POA: Diagnosis not present

## 2014-09-29 DIAGNOSIS — J45909 Unspecified asthma, uncomplicated: Secondary | ICD-10-CM | POA: Diagnosis not present

## 2014-09-29 DIAGNOSIS — Z79899 Other long term (current) drug therapy: Secondary | ICD-10-CM | POA: Diagnosis not present

## 2014-09-29 DIAGNOSIS — Z8701 Personal history of pneumonia (recurrent): Secondary | ICD-10-CM | POA: Insufficient documentation

## 2014-09-29 DIAGNOSIS — W01198A Fall on same level from slipping, tripping and stumbling with subsequent striking against other object, initial encounter: Secondary | ICD-10-CM | POA: Diagnosis not present

## 2014-09-29 DIAGNOSIS — Z8673 Personal history of transient ischemic attack (TIA), and cerebral infarction without residual deficits: Secondary | ICD-10-CM | POA: Diagnosis not present

## 2014-09-29 DIAGNOSIS — Y998 Other external cause status: Secondary | ICD-10-CM | POA: Insufficient documentation

## 2014-09-29 DIAGNOSIS — Z8639 Personal history of other endocrine, nutritional and metabolic disease: Secondary | ICD-10-CM | POA: Diagnosis not present

## 2014-09-29 DIAGNOSIS — Y9389 Activity, other specified: Secondary | ICD-10-CM | POA: Diagnosis not present

## 2014-09-29 DIAGNOSIS — Z7982 Long term (current) use of aspirin: Secondary | ICD-10-CM | POA: Diagnosis not present

## 2014-09-29 DIAGNOSIS — K219 Gastro-esophageal reflux disease without esophagitis: Secondary | ICD-10-CM | POA: Insufficient documentation

## 2014-09-29 DIAGNOSIS — Y9289 Other specified places as the place of occurrence of the external cause: Secondary | ICD-10-CM | POA: Insufficient documentation

## 2014-09-29 DIAGNOSIS — S301XXA Contusion of abdominal wall, initial encounter: Secondary | ICD-10-CM | POA: Diagnosis not present

## 2014-09-29 DIAGNOSIS — M797 Fibromyalgia: Secondary | ICD-10-CM | POA: Diagnosis not present

## 2014-09-29 DIAGNOSIS — I252 Old myocardial infarction: Secondary | ICD-10-CM | POA: Insufficient documentation

## 2014-09-29 DIAGNOSIS — I10 Essential (primary) hypertension: Secondary | ICD-10-CM | POA: Insufficient documentation

## 2014-09-29 DIAGNOSIS — S3991XA Unspecified injury of abdomen, initial encounter: Secondary | ICD-10-CM | POA: Diagnosis present

## 2014-09-29 DIAGNOSIS — Z880 Allergy status to penicillin: Secondary | ICD-10-CM | POA: Diagnosis not present

## 2014-09-29 DIAGNOSIS — E039 Hypothyroidism, unspecified: Secondary | ICD-10-CM | POA: Insufficient documentation

## 2014-09-29 LAB — CBC WITH DIFFERENTIAL/PLATELET
Basophils Absolute: 0 10*3/uL (ref 0.0–0.1)
Basophils Relative: 0 % (ref 0–1)
EOS ABS: 0 10*3/uL (ref 0.0–0.7)
Eosinophils Relative: 0 % (ref 0–5)
HEMATOCRIT: 44.6 % (ref 39.0–52.0)
HEMOGLOBIN: 15.6 g/dL (ref 13.0–17.0)
LYMPHS ABS: 1.1 10*3/uL (ref 0.7–4.0)
LYMPHS PCT: 9 % — AB (ref 12–46)
MCH: 29.6 pg (ref 26.0–34.0)
MCHC: 35 g/dL (ref 30.0–36.0)
MCV: 84.6 fL (ref 78.0–100.0)
MONO ABS: 0.6 10*3/uL (ref 0.1–1.0)
MONOS PCT: 5 % (ref 3–12)
NEUTROS ABS: 9.8 10*3/uL — AB (ref 1.7–7.7)
NEUTROS PCT: 86 % — AB (ref 43–77)
Platelets: 189 10*3/uL (ref 150–400)
RBC: 5.27 MIL/uL (ref 4.22–5.81)
RDW: 13.2 % (ref 11.5–15.5)
WBC: 11.5 10*3/uL — AB (ref 4.0–10.5)

## 2014-09-29 LAB — COMPREHENSIVE METABOLIC PANEL
ALBUMIN: 4.5 g/dL (ref 3.5–5.0)
ALT: 39 U/L (ref 17–63)
AST: 24 U/L (ref 15–41)
Alkaline Phosphatase: 116 U/L (ref 38–126)
Anion gap: 10 (ref 5–15)
BILIRUBIN TOTAL: 0.8 mg/dL (ref 0.3–1.2)
BUN: 17 mg/dL (ref 6–20)
CALCIUM: 9.2 mg/dL (ref 8.9–10.3)
CO2: 23 mmol/L (ref 22–32)
CREATININE: 0.97 mg/dL (ref 0.61–1.24)
Chloride: 106 mmol/L (ref 101–111)
GFR calc non Af Amer: >60 mL/min (ref >60)
Glucose, Bld: 131 mg/dL — ABNORMAL HIGH (ref 65–99)
POTASSIUM: 3.4 mmol/L — AB (ref 3.5–5.1)
Sodium: 139 mmol/L (ref 135–145)
TOTAL PROTEIN: 8 g/dL (ref 6.5–8.1)

## 2014-09-29 LAB — LIPASE, BLOOD: Lipase: 29 U/L (ref 22–51)

## 2014-09-29 MED ORDER — ONDANSETRON HCL 4 MG/2ML IJ SOLN
4.0000 mg | Freq: Once | INTRAMUSCULAR | Status: AC
  Administered 2014-09-29: 4 mg via INTRAVENOUS
  Filled 2014-09-29: qty 2

## 2014-09-29 MED ORDER — MORPHINE SULFATE 4 MG/ML IJ SOLN
4.0000 mg | Freq: Once | INTRAMUSCULAR | Status: AC
  Administered 2014-09-29: 4 mg via INTRAVENOUS
  Filled 2014-09-29: qty 1

## 2014-09-29 MED ORDER — IOHEXOL 300 MG/ML  SOLN
100.0000 mL | Freq: Once | INTRAMUSCULAR | Status: AC | PRN
Start: 2014-09-29 — End: 2014-09-29
  Administered 2014-09-29: 100 mL via INTRAVENOUS

## 2014-09-29 MED ORDER — HYDROMORPHONE HCL 1 MG/ML IJ SOLN
1.0000 mg | Freq: Once | INTRAMUSCULAR | Status: DC
  Filled 2014-09-29: qty 1

## 2014-09-29 NOTE — Discharge Instructions (Signed)
Take your own pain meds as needed.  Follow up with your md as needed

## 2014-09-29 NOTE — ED Provider Notes (Signed)
CSN: [id number]     Arrival date & time 09/29/14  1221 History   First MD Initiated Contact with Patient 09/29/14 1237     Chief Complaint  Patient presents with  . Abdominal Injury     (Consider location/radiation/quality/duration/timing/severity/associated sxs/prior Treatment) Patient is a 59 y.o. male presenting with abdominal pain. The history is provided by the patient (the pt states he had a tire fall and hit his abdomen.  he has mild periumbilical pain).  Abdominal Pain Pain location:  Periumbilical Pain quality: aching   Pain radiates to:  Does not radiate Pain severity:  Mild Onset quality:  Sudden Timing:  Constant Progression:  Unchanged Chronicity:  New Context: not alcohol use   Associated symptoms: no chest pain, no cough, no diarrhea, no fatigue and no hematuria     Past Medical History  Diagnosis Date  . Back pain   . Hypertension   . Asthma   . Hypothyroidism   . High cholesterol   . Stroke     right leg weakness  . Fibromyalgia   . Myocardial infarct     cardiologist in town  . GERD (gastroesophageal reflux disease)   . Pancreatitis     with pseudocysts  . Pneumonia   . Alcoholism    Past Surgical History  Procedure Laterality Date  . Back surgery    . Cholecystectomy  2009  . Finger surgery    . Pancreatic pseudocyst drainage  2014   Family History  Problem Relation Age of Onset  . Diabetes Mother   . Diabetes Brother   . Diabetes Brother   . Cancer Father     ? stomach  . Pancreatitis Neg Hx    History  Substance Use Topics  . Smoking status: Current Some Day Smoker -- 0.40 packs/day    Types: Cigarettes  . Smokeless tobacco: Never Used  . Alcohol Use: No     Comment: previously used alcohol and quit 6 years ago    Review of Systems  Constitutional: Negative for appetite change and fatigue.  HENT: Negative for congestion, ear discharge and sinus pressure.   Eyes: Negative for discharge.  Respiratory: Negative for cough.    Cardiovascular: Negative for chest pain.  Gastrointestinal: Positive for abdominal pain. Negative for diarrhea.  Genitourinary: Negative for frequency and hematuria.  Musculoskeletal: Negative for back pain.  Skin: Negative for rash.  Neurological: Negative for seizures and headaches.  Psychiatric/Behavioral: Negative for hallucinations.      Allergies  Amoxicillin; Dilaudid; and Hydromorphone  Home Medications   Prior to Admission medications   Medication Sig Start Date End Date Taking? Authorizing Provider  ibuprofen (ADVIL,MOTRIN) 200 MG tablet Take 200 mg by mouth every 8 (eight) hours as needed for mild pain or moderate pain.   Yes Historical Provider, MD  aspirin EC 81 MG tablet Take 81 mg by mouth daily.    Historical Provider, MD  cloNIDine (CATAPRES - DOSED IN MG/24 HR) 0.1 mg/24hr patch Place 0.1 mg onto the skin once a week.    Historical Provider, MD  DULoxetine (CYMBALTA) 60 MG capsule Take 60 mg by mouth every morning.     Historical Provider, MD  ferrous sulfate 325 (65 FE) MG tablet Take 325 mg by mouth daily with breakfast.    Historical Provider, MD  fexofenadine (ALLEGRA) 60 MG tablet Take 60 mg by mouth daily.    Historical Provider, MD  gabapentin (NEURONTIN) 300 MG capsule Take 300 mg by mouth 3 (three) times daily.  Historical Provider, MD  HYDROcodone-acetaminophen (NORCO) 10-325 MG per tablet Take 1 tablet by mouth 2 (two) times daily as needed for moderate pain or severe pain.    Historical Provider, MD  levothyroxine (SYNTHROID, LEVOTHROID) 100 MCG tablet Take 100 mcg by mouth every morning.     Historical Provider, MD  lidocaine (LIDODERM) 5 % Place 2 patches onto the skin daily. Remove & Discard patch within 12 hours or as directed by MD    Historical Provider, MD  methocarbamol (ROBAXIN) 500 MG tablet Take 500 mg by mouth 3 (three) times daily.    Historical Provider, MD  ondansetron (ZOFRAN) 4 MG tablet Take 1 tablet (4 mg total) by mouth every 6 (six)  hours as needed for nausea. 06/22/13   Dione Booze, MD  oxyCODONE-acetaminophen (PERCOCET) 7.5-325 MG per tablet Take 1 tablet by mouth every 4 (four) hours as needed for pain. 06/22/13   Dione Booze, MD  pantoprazole (PROTONIX) 40 MG tablet Take 1 tablet (40 mg total) by mouth daily. 02/06/13   Standley Brooking, MD   BP 121/80 mmHg  Pulse 94  Temp(Src) 98 F (36.7 C) (Oral)  Resp 18  Ht 5\' 7"  (1.702 m)  Wt 157 lb (71.215 kg)  BMI 24.58 kg/m2  SpO2 99% Physical Exam  Constitutional: He is oriented to person, place, and time. He appears well-developed.  HENT:  Head: Normocephalic.  Eyes: Conjunctivae and EOM are normal. No scleral icterus.  Neck: Neck supple. No thyromegaly present.  Cardiovascular: Normal rate and regular rhythm.  Exam reveals no gallop and no friction rub.   No murmur heard. Pulmonary/Chest: No stridor. He has no wheezes. He has no rales. He exhibits no tenderness.  Abdominal: He exhibits no distension. There is tenderness. There is no rebound.  Musculoskeletal: Normal range of motion. He exhibits no edema.  Lymphadenopathy:    He has no cervical adenopathy.  Neurological: He is oriented to person, place, and time. He exhibits normal muscle tone. Coordination normal.  Skin: No rash noted. No erythema.  Psychiatric: He has a normal mood and affect. His behavior is normal.    ED Course  Procedures (including critical care time) Labs Review Labs Reviewed  CBC WITH DIFFERENTIAL/PLATELET - Abnormal; Notable for the following:    WBC 11.5 (*)    Neutrophils Relative % 86 (*)    Neutro Abs 9.8 (*)    Lymphocytes Relative 9 (*)    All other components within normal limits  COMPREHENSIVE METABOLIC PANEL - Abnormal; Notable for the following:    Potassium 3.4 (*)    Glucose, Bld 131 (*)    All other components within normal limits  LIPASE, BLOOD    Imaging Review Ct Abdomen Pelvis W Contrast  09/29/2014   CLINICAL DATA:  Trauma, tire rim struck patient in  abdomen this morning at 0930 hours, abdominal pain, history pancreatitis with known pancreatic cysts, hypertension, asthma, stroke, MI, smoking  EXAM: CT ABDOMEN AND PELVIS WITH CONTRAST  TECHNIQUE: Multidetector CT imaging of the abdomen and pelvis was performed using the standard protocol following bolus administration of intravenous contrast. Sagittal and coronal MPR images reconstructed from axial data set.  CONTRAST:  10/01/2014 OMNIPAQUE IOHEXOL 300 MG/ML SOLN IV. No oral contrast administered.  COMPARISON:  06/22/2013  FINDINGS: Minimal scarring RIGHT lung base.  Gallbladder surgically absent with mild intrahepatic biliary dilatation.  Decreased size of pancreas since previous exam with mild ductal dilatation up to 5 mm diameter.  Pseudocyst at body of pancreas 2.3  x 1.7 x 2.0 cm image 21 previously 11 mm greatest diameter.  Probable focal fatty infiltration of liver adjacent to falciform fissure.  Multiple BILATERAL nonobstructing renal calculi.  Liver, spleen, pancreas, kidneys and adrenal glands otherwise unremarkable.  Portal vein patent.  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  Supraumbilical ventral hernia containing fat.  Gastric antrum decompressed, unable to exclude distal gastric wall thickening.  Diverticulosis of descending and sigmoid colon without evidence of diverticulitis.  Stomach and bowel loops otherwise unremarkable.  No mass, adenopathy, free fluid, or free air.  Mild degenerative disc and facet disease changes lumbar spine without acute osseous abnormality.  IMPRESSION: No evidence acute intra-abdominal injury.  Small pancreatic pseudocyst increased since 2015.  Pancreatic ductal dilatation.  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  Supraumbilical ventral hernia containing fat.  Distal colonic diverticulosis.  Multiple nonobstructing renal calculi.  Intrahepatic biliary  dilatation post cholecystectomy and pancreatitis, recommend correlation with LFTs.   Electronically Signed   By: Ulyses Southward M.D.   On: 09/29/2014 13:45     EKG Interpretation None      MDM   Final diagnoses:  Abdominal contusion, initial encounter    Contusion to abd from tire.  Ct and labs unremarkable.   Pt to take his own pain meds and follow up with pcp    Bethann Berkshire, MD 09/29/14 1517

## 2014-09-29 NOTE — ED Notes (Signed)
Tires fell and hit Abdomen.  Rates 9/10.  Injury at home.

## 2014-12-10 ENCOUNTER — Other Ambulatory Visit (HOSPITAL_COMMUNITY): Payer: Self-pay | Admitting: Orthopedic Surgery

## 2014-12-10 DIAGNOSIS — M25562 Pain in left knee: Secondary | ICD-10-CM

## 2014-12-28 ENCOUNTER — Ambulatory Visit (HOSPITAL_COMMUNITY)
Admission: RE | Admit: 2014-12-28 | Discharge: 2014-12-28 | Disposition: A | Discharge status: Home / Self Care | Payer: Medicare Other | Source: Ambulatory Visit | Attending: Orthopedic Surgery | Admitting: Orthopedic Surgery

## 2014-12-28 DIAGNOSIS — M25462 Effusion, left knee: Secondary | ICD-10-CM | POA: Insufficient documentation

## 2014-12-28 DIAGNOSIS — M23222 Derangement of posterior horn of medial meniscus due to old tear or injury, left knee: Secondary | ICD-10-CM | POA: Insufficient documentation

## 2014-12-28 DIAGNOSIS — M25562 Pain in left knee: Secondary | ICD-10-CM | POA: Diagnosis present

## 2015-01-23 ENCOUNTER — Encounter (HOSPITAL_COMMUNITY): Payer: Self-pay

## 2015-01-23 ENCOUNTER — Emergency Department (HOSPITAL_COMMUNITY): Payer: Medicare Other

## 2015-01-23 ENCOUNTER — Emergency Department (HOSPITAL_COMMUNITY)
Admission: EM | Admit: 2015-01-23 | Discharge: 2015-01-23 | Disposition: A | Discharge status: Home / Self Care | Payer: Medicare Other | Attending: Emergency Medicine | Admitting: Emergency Medicine

## 2015-01-23 DIAGNOSIS — Z8673 Personal history of transient ischemic attack (TIA), and cerebral infarction without residual deficits: Secondary | ICD-10-CM | POA: Insufficient documentation

## 2015-01-23 DIAGNOSIS — Z7982 Long term (current) use of aspirin: Secondary | ICD-10-CM | POA: Insufficient documentation

## 2015-01-23 DIAGNOSIS — K859 Acute pancreatitis without necrosis or infection, unspecified: Secondary | ICD-10-CM | POA: Diagnosis not present

## 2015-01-23 DIAGNOSIS — R1013 Epigastric pain: Secondary | ICD-10-CM | POA: Diagnosis present

## 2015-01-23 DIAGNOSIS — Z9889 Other specified postprocedural states: Secondary | ICD-10-CM | POA: Diagnosis not present

## 2015-01-23 DIAGNOSIS — I252 Old myocardial infarction: Secondary | ICD-10-CM | POA: Diagnosis not present

## 2015-01-23 DIAGNOSIS — M797 Fibromyalgia: Secondary | ICD-10-CM | POA: Diagnosis not present

## 2015-01-23 DIAGNOSIS — D649 Anemia, unspecified: Secondary | ICD-10-CM

## 2015-01-23 DIAGNOSIS — J45909 Unspecified asthma, uncomplicated: Secondary | ICD-10-CM | POA: Insufficient documentation

## 2015-01-23 DIAGNOSIS — Z720 Tobacco use: Secondary | ICD-10-CM | POA: Insufficient documentation

## 2015-01-23 DIAGNOSIS — I10 Essential (primary) hypertension: Secondary | ICD-10-CM | POA: Diagnosis not present

## 2015-01-23 DIAGNOSIS — Z880 Allergy status to penicillin: Secondary | ICD-10-CM | POA: Diagnosis not present

## 2015-01-23 DIAGNOSIS — E039 Hypothyroidism, unspecified: Secondary | ICD-10-CM | POA: Diagnosis not present

## 2015-01-23 DIAGNOSIS — R748 Abnormal levels of other serum enzymes: Secondary | ICD-10-CM

## 2015-01-23 DIAGNOSIS — R1010 Upper abdominal pain, unspecified: Secondary | ICD-10-CM

## 2015-01-23 DIAGNOSIS — Z79899 Other long term (current) drug therapy: Secondary | ICD-10-CM | POA: Diagnosis not present

## 2015-01-23 DIAGNOSIS — Z8701 Personal history of pneumonia (recurrent): Secondary | ICD-10-CM | POA: Diagnosis not present

## 2015-01-23 LAB — CBC WITH DIFFERENTIAL/PLATELET
BASOS PCT: 0 %
Basophils Absolute: 0 10*3/uL (ref 0.0–0.1)
EOS ABS: 0.1 10*3/uL (ref 0.0–0.7)
Eosinophils Relative: 2 %
HCT: 37.2 % — ABNORMAL LOW (ref 39.0–52.0)
HEMOGLOBIN: 12.5 g/dL — AB (ref 13.0–17.0)
LYMPHS ABS: 1.1 10*3/uL (ref 0.7–4.0)
Lymphocytes Relative: 21 %
MCH: 29.5 pg (ref 26.0–34.0)
MCHC: 33.6 g/dL (ref 30.0–36.0)
MCV: 87.7 fL (ref 78.0–100.0)
Monocytes Absolute: 0.3 10*3/uL (ref 0.1–1.0)
Monocytes Relative: 7 %
NEUTROS PCT: 70 %
Neutro Abs: 3.6 10*3/uL (ref 1.7–7.7)
Platelets: 167 10*3/uL (ref 150–400)
RBC: 4.24 MIL/uL (ref 4.22–5.81)
RDW: 14.9 % (ref 11.5–15.5)
WBC: 5.1 10*3/uL (ref 4.0–10.5)

## 2015-01-23 LAB — COMPREHENSIVE METABOLIC PANEL
ALT: 24 U/L (ref 17–63)
ANION GAP: 4 — AB (ref 5–15)
AST: 19 U/L (ref 15–41)
Albumin: 4 g/dL (ref 3.5–5.0)
Alkaline Phosphatase: 75 U/L (ref 38–126)
BILIRUBIN TOTAL: 0.4 mg/dL (ref 0.3–1.2)
BUN: 15 mg/dL (ref 6–20)
CO2: 26 mmol/L (ref 22–32)
Calcium: 8.9 mg/dL (ref 8.9–10.3)
Chloride: 110 mmol/L (ref 101–111)
Creatinine, Ser: 0.96 mg/dL (ref 0.61–1.24)
GFR calc Af Amer: >60 mL/min (ref >60)
Glucose, Bld: 120 mg/dL — ABNORMAL HIGH (ref 65–99)
Potassium: 3.6 mmol/L (ref 3.5–5.1)
SODIUM: 140 mmol/L (ref 135–145)
TOTAL PROTEIN: 6.9 g/dL (ref 6.5–8.1)

## 2015-01-23 LAB — LIPASE, BLOOD: LIPASE: 62 U/L — AB (ref 11–51)

## 2015-01-23 MED ORDER — ONDANSETRON HCL 4 MG/2ML IJ SOLN
4.0000 mg | Freq: Once | INTRAMUSCULAR | Status: AC
  Administered 2015-01-23: 4 mg via INTRAVENOUS
  Filled 2015-01-23: qty 2

## 2015-01-23 MED ORDER — PANTOPRAZOLE SODIUM 40 MG IV SOLR
40.0000 mg | Freq: Once | INTRAVENOUS | Status: AC
  Administered 2015-01-23: 40 mg via INTRAVENOUS
  Filled 2015-01-23: qty 40

## 2015-01-23 MED ORDER — BARIUM SULFATE 2 % PO SUSP: 450.0000 mL | Freq: Once | ORAL | Status: DC

## 2015-01-23 MED ORDER — MORPHINE SULFATE (PF) 4 MG/ML IV SOLN
6.0000 mg | INTRAVENOUS | Status: DC | PRN
  Administered 2015-01-23: 6 mg via INTRAVENOUS
  Filled 2015-01-23: qty 2

## 2015-01-23 MED ORDER — ONDANSETRON HCL 4 MG/2ML IJ SOLN
4.0000 mg | Freq: Once | INTRAMUSCULAR | Status: AC
  Administered 2015-01-23: 4 mg via INTRAVENOUS

## 2015-01-23 MED ORDER — PROMETHAZINE HCL 25 MG/ML IJ SOLN
12.5000 mg | INTRAMUSCULAR | Status: DC | PRN
  Administered 2015-01-23: 12.5 mg via INTRAVENOUS
  Filled 2015-01-23: qty 1

## 2015-01-23 MED ORDER — IOHEXOL 300 MG/ML  SOLN
100.0000 mL | Freq: Once | INTRAMUSCULAR | Status: AC | PRN
  Administered 2015-01-23: 100 mL via INTRAVENOUS

## 2015-01-23 MED ORDER — MORPHINE SULFATE (PF) 4 MG/ML IV SOLN
4.0000 mg | Freq: Once | INTRAVENOUS | Status: AC
  Administered 2015-01-23: 4 mg via INTRAVENOUS
  Filled 2015-01-23: qty 1

## 2015-01-23 MED ORDER — SODIUM CHLORIDE 0.9 % IV SOLN
1000.0000 mL | INTRAVENOUS | Status: DC
  Administered 2015-01-23: 1000 mL via INTRAVENOUS

## 2015-01-23 MED ORDER — ONDANSETRON HCL 8 MG PO TABS: 8.0000 mg | ORAL_TABLET | Freq: Three times a day (TID) | ORAL | Status: DC | PRN

## 2015-01-23 MED ORDER — OXYCODONE-ACETAMINOPHEN 5-325 MG PO TABS: 1.0000 | ORAL_TABLET | ORAL | Status: DC | PRN

## 2015-01-23 MED ORDER — SODIUM CHLORIDE 0.9 % IV SOLN
1000.0000 mL | Freq: Once | INTRAVENOUS | Status: AC
  Administered 2015-01-23: 1000 mL via INTRAVENOUS

## 2015-01-23 MED ORDER — ONDANSETRON HCL 4 MG/2ML IJ SOLN
INTRAMUSCULAR | Status: AC
  Filled 2015-01-23: qty 2

## 2015-01-23 NOTE — ED Notes (Signed)
Pt reports abd pain that started 4 days ago and has worsened since then.  Pt admits to nausea, no vomiting, has had some diarrhea

## 2015-01-23 NOTE — ED Notes (Signed)
Pt made aware to return if symptoms worsen or if any life threatening symptoms occur.   

## 2015-01-23 NOTE — ED Provider Notes (Signed)
Justin [name] is a 59 y.o. male   07:50-reevaluation post initial treatment by Dr. Preston Fleeting. CT has returned and shows uncomplicated mild pancreatitis. This is consistent with his lipase with a mild elevation from baseline normal. At this time. Patient states he is still nauseated and having abdominal pain. He is not sure he is comfortable enough to go home. We'll continue treatment for acute pancreatitis, with antiemetics and analgesia.  Medications  0.9 %  sodium chloride infusion (0 mLs Intravenous Stopped 01/23/15 0619)    Followed by  0.9 %  sodium chloride infusion (1,000 mLs Intravenous New Bag/Given 01/23/15 0619)  barium (READI-CAT 2) 2 % suspension 450 mL (not administered)  ondansetron (ZOFRAN) injection 4 mg (4 mg Intravenous Given 01/23/15 0537)  morphine 4 MG/ML injection 4 mg (4 mg Intravenous Given 01/23/15 0539)  pantoprazole (PROTONIX) injection 40 mg (40 mg Intravenous Given 01/23/15 0600)  iohexol (OMNIPAQUE) 300 MG/ML solution 100 mL (100 mLs Intravenous Contrast Given 01/23/15 0659)  ondansetron (ZOFRAN) injection 4 mg (4 mg Intravenous Given 01/23/15 0653)    Patient Vitals for the past 24 hrs:  BP Temp Temp src Pulse Resp SpO2 Height Weight  01/23/15 0742 109/75 mmHg - - 77 14 99 % - -  01/23/15 0523 117/74 mmHg 97.9 F (36.6 C) Oral (!) 59 14 98 % 5\' 7"  (1.702 m) 160 lb (72.576 kg)     11:07 AM Reevaluation with update and discussion. After initial assessment and treatment, an updated evaluation reveals he is more comfortable now states his pain is tolerable enough to go home. Findings discussed with patient and wife, all questions were answered. Toron Bowring L   CRITICAL CARE Performed by: L Total critical care time: 35 minutes minutes Critical care time was exclusive of separately billable procedures and treating other patients. Critical care was necessary to treat or prevent imminent or life-threatening deterioration. Critical care was time  spent personally by me on the following activities: development of treatment plan with patient and/or surrogate as well as nursing, discussions with consultants, evaluation of patient's response to treatment, examination of patient, obtaining history from patient or surrogate, ordering and performing treatments and interventions, ordering and review of laboratory studies, ordering and review of radiographic studies, pulse oximetry and re-evaluation of patient's condition.   Results for orders placed or performed during the hospital encounter of 01/23/15  Comprehensive metabolic panel  Result Value Ref Range   Sodium 140 135 - 145 mmol/L   Potassium 3.6 3.5 - 5.1 mmol/L   Chloride 110 101 - 111 mmol/L   CO2 26 22 - 32 mmol/L   Glucose, Bld 120 (H) 65 - 99 mg/dL   BUN 15 6 - 20 mg/dL   Creatinine, Ser 13/12/16 0.61 - 1.24 mg/dL   Calcium 8.9 8.9 - 0.86 mg/dL   Total Protein 6.9 6.5 - 8.1 g/dL   Albumin 4.0 3.5 - 5.0 g/dL   AST 19 15 - 41 U/L   ALT 24 17 - 63 U/L   Alkaline Phosphatase 75 38 - 126 U/L   Total Bilirubin 0.4 0.3 - 1.2 mg/dL   GFR calc non Af Amer >60 >60 mL/min   GFR calc Af Amer >60 >60 mL/min   Anion gap 4 (L) 5 - 15  Lipase, blood  Result Value Ref Range   Lipase 62 (H) 11 - 51 U/L  CBC with Differential  Result Value Ref Range   WBC 5.1 4.0 - 10.5 K/uL   RBC 4.24 4.22 -  5.81 MIL/uL   Hemoglobin 12.5 (L) 13.0 - 17.0 g/dL   HCT 56.3 (L) 87.5 - 64.3 %   MCV 87.7 78.0 - 100.0 fL   MCH 29.5 26.0 - 34.0 pg   MCHC 33.6 30.0 - 36.0 g/dL   RDW 32.9 51.8 - 84.1 %   Platelets 167 150 - 400 K/uL   Neutrophils Relative % 70 %   Neutro Abs 3.6 1.7 - 7.7 K/uL   Lymphocytes Relative 21 %   Lymphs Abs 1.1 0.7 - 4.0 K/uL   Monocytes Relative 7 %   Monocytes Absolute 0.3 0.1 - 1.0 K/uL   Eosinophils Relative 2 %   Eosinophils Absolute 0.1 0.0 - 0.7 K/uL   Basophils Relative 0 %   Basophils Absolute 0.0 0.0 - 0.1 K/uL  Ct Abdomen Pelvis W Contrast  01/23/2015  CLINICAL DATA:   Sharp lower abdominal pain beginning 4 days ago, worsening since. Positive nausea and diarrhea. History of pancreatitis. EXAM: CT ABDOMEN AND PELVIS WITH CONTRAST TECHNIQUE: Multidetector CT imaging of the abdomen and pelvis was performed using the standard protocol following bolus administration of intravenous contrast. CONTRAST:  OMNIPAQUE IOHEXOL 300 MG/ML  SOLN COMPARISON:  09/29/2014 FINDINGS: Lung bases: Minor areas of stable scarring/subsegmental atelectasis. Otherwise clear. Heart normal size. Pancreas: Cyst arises from the anterior superior pancreatic body measuring 2.3 x 1.6 cm, stable from the prior study. There is mild dilation of the pancreatic duct that is also unchanged. Mild hazy inflammatory type changes noted adjacent to the pancreas, new from the prior exam. Pancreas shows normal diffuse enhancement. Hepatobiliary: Unremarkable liver. Intrahepatic bile duct dilation is similar to the prior exam. Common bile duct normal in caliber for age. Gallbladder surgically absent. Spleen:  Normal. Adrenal glands:  No masses. Kidneys, ureters, bladder: Small nonobstructing intrarenal stones. No renal masses. Symmetric renal enhancement and excretion. No hydronephrosis. Normal ureters. Normal bladder Lymph nodes:  No pathologically enlarged lymph nodes. Ascites:  None. Gastrointestinal: There is thickening of the wall of the distal stomach adjacent to the peripancreatic inflammatory change. This is presumed to be reactive. Stomach otherwise unremarkable. Normal small bowel. Multiple left colon diverticula. No diverticulitis. Colon otherwise unremarkable. Normal appendix. Abdominal wall: There is rectus abdominus diastases. There is some bulging of the fascia along the abdominal midline. There is a small left-sided fat containing hernia. These findings are stable. Musculoskeletal: Degenerative changes noted of the visualized spine. No osteoblastic or osteolytic lesions. 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 IMPRESSION: 1. Mild uncomplicated pancreatitis. No evidence of pancreatic necrosis, abscess or of venous thrombosis. 2. 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 3. Stable mild intrahepatic bile duct dilation. 4. Small nonobstructing intrarenal stones. Colonic diverticula without diverticulitis. Electronically Signed   By: Amie Portland M.D.   On: 01/23/2015 07:28    Mancel Bale, MD 01/23/15 1108

## 2015-01-23 NOTE — ED Provider Notes (Signed)
CSN: [id number]     Arrival date & time 01/23/15  0509 History   None    Chief Complaint  Patient presents with  . Abdominal Pain     (Consider location/radiation/quality/duration/timing/severity/associated sxs/prior Treatment) Patient is a 59 y.o. male presenting with abdominal pain. The history is provided by the patient.  Abdominal Pain He complains of epigastric pain with some radiation to the back. Pain started about 4 days ago and is typical of his pancreatitis. He has a history of pancreatitis and pancreatic pseudocyst which had been treated surgically at [facility]. He thinks he may be due to something to trigger this. He denies alcohol consumption. There is associated nausea and diarrhea but no vomiting. Pain is rated at 8/10. Nothing makes it better, and nothing makes it worse.  Past Medical History  Diagnosis Date  . Back pain   . Hypertension   . Asthma   . Hypothyroidism   . High cholesterol   . Stroke [facility])     right leg weakness  . Fibromyalgia   . Myocardial infarct [facility])     cardiologist in town  . GERD (gastroesophageal reflux disease)   . Pancreatitis     with pseudocysts  . Pneumonia   . Alcoholism [facility])    Past Surgical History  Procedure Laterality Date  . Back surgery    . Cholecystectomy  2009  . Finger surgery    . Pancreatic pseudocyst drainage  2014   Family History  Problem Relation Age of Onset  . Diabetes Mother   . Diabetes Brother   . Diabetes Brother   . Cancer Father     ? stomach  . Pancreatitis Neg Hx    Social History  Substance Use Topics  . Smoking status: Current Some Day Smoker -- 0.40 packs/day    Types: Cigarettes  . Smokeless tobacco: Never Used  . Alcohol Use: No     Comment: previously used alcohol and quit 6 years ago    Review of Systems  Gastrointestinal: Positive for abdominal pain.  All other systems reviewed and are negative.     Allergies  Amoxicillin; Dilaudid; and Hydromorphone  Home  Medications   Prior to Admission medications   Medication Sig Start Date End Date Taking? Authorizing Provider  aspirin EC 81 MG tablet Take 81 mg by mouth daily.   Yes Historical Provider, MD  cloNIDine (CATAPRES - DOSED IN MG/24 HR) 0.1 mg/24hr patch Place 0.1 mg onto the skin once a week.   Yes Historical Provider, MD  DULoxetine (CYMBALTA) 60 MG capsule Take 60 mg by mouth every morning.    Yes Historical Provider, MD  ferrous sulfate 325 (65 FE) MG tablet Take 325 mg by mouth daily with breakfast.   Yes Historical Provider, MD  fexofenadine (ALLEGRA) 60 MG tablet Take 60 mg by mouth daily.   Yes Historical Provider, MD  finasteride (PROSCAR) 5 MG tablet Take 5 mg by mouth daily.   Yes Historical Provider, MD  gabapentin (NEURONTIN) 300 MG capsule Take 300 mg by mouth 3 (three) times daily.   Yes Historical Provider, MD  levothyroxine (SYNTHROID, LEVOTHROID) 150 MCG tablet Take 150 mcg by mouth daily before breakfast.   Yes Historical Provider, MD  ibuprofen (ADVIL,MOTRIN) 200 MG tablet Take 200 mg by mouth every 8 (eight) hours as needed for mild pain or moderate pain.    Historical Provider, MD   BP 117/74 mmHg  Pulse 59  Temp(Src) 97.9 F (36.6 C) (Oral)  Resp  14  Ht 5\' 7"  (1.702 m)  Wt 160 lb (72.576 kg)  BMI 25.05 kg/m2  SpO2 98% Physical Exam  Nursing note and vitals reviewed.  59 year old male, resting comfortably and in no acute distress. Vital signs are significant for borderline bradycardia. Oxygen saturation is 98%, which is normal. Head is normocephalic and atraumatic. PERRLA, EOMI. Oropharynx is clear. Neck is nontender and supple without adenopathy or JVD. Back is nontender and there is no CVA tenderness. Lungs are clear without rales, wheezes, or rhonchi. Chest is nontender. Heart has regular rate and rhythm without murmur. Abdomen is soft, flat, with moderate epigastric tenderness. There is no rebound or guarding. There are no masses or hepatosplenomegaly and  peristalsis is hypoactive. Extremities have no cyanosis or edema, full range of motion is present. Skin is warm and dry without rash. Neurologic: Mental status is normal, cranial nerves are intact, there are no motor or sensory deficits.  ED Course  Procedures (including critical care time) Labs Review Results for orders placed or performed during the hospital encounter of 01/23/15  Comprehensive metabolic panel  Result Value Ref Range   Sodium 140 135 - 145 mmol/L   Potassium 3.6 3.5 - 5.1 mmol/L   Chloride 110 101 - 111 mmol/L   CO2 26 22 - 32 mmol/L   Glucose, Bld 120 (H) 65 - 99 mg/dL   BUN 15 6 - 20 mg/dL   Creatinine, Ser 0.72 0.61 - 1.24 mg/dL   Calcium 8.9 8.9 - 25.7 mg/dL   Total Protein 6.9 6.5 - 8.1 g/dL   Albumin 4.0 3.5 - 5.0 g/dL   AST 19 15 - 41 U/L   ALT 24 17 - 63 U/L   Alkaline Phosphatase 75 38 - 126 U/L   Total Bilirubin 0.4 0.3 - 1.2 mg/dL   GFR calc non Af Amer >60 >60 mL/min   GFR calc Af Amer >60 >60 mL/min   Anion gap 4 (L) 5 - 15  Lipase, blood  Result Value Ref Range   Lipase 62 (H) 11 - 51 U/L  CBC with Differential  Result Value Ref Range   WBC 5.1 4.0 - 10.5 K/uL   RBC 4.24 4.22 - 5.81 MIL/uL   Hemoglobin 12.5 (L) 13.0 - 17.0 g/dL   HCT 50.5 (L) 18.3 - 35.8 %   MCV 87.7 78.0 - 100.0 fL   MCH 29.5 26.0 - 34.0 pg   MCHC 33.6 30.0 - 36.0 g/dL   RDW 25.1 89.8 - 42.1 %   Platelets 167 150 - 400 K/uL   Neutrophils Relative % 70 %   Neutro Abs 3.6 1.7 - 7.7 K/uL   Lymphocytes Relative 21 %   Lymphs Abs 1.1 0.7 - 4.0 K/uL   Monocytes Relative 7 %   Monocytes Absolute 0.3 0.1 - 1.0 K/uL   Eosinophils Relative 2 %   Eosinophils Absolute 0.1 0.0 - 0.7 K/uL   Basophils Relative 0 %   Basophils Absolute 0.0 0.0 - 0.1 K/uL   I have personally reviewed and evaluated these images and lab results as part of my medical decision-making.   MDM   Final diagnoses:  Upper abdominal pain  Normochromic normocytic anemia  Elevated lipase     Abdominal pain consistent with pancreatitis in patient with known history of pancreatitis and pancreatic pseudocyst. Old records are reviewed and he has several ED visits and hospitalizations for same. He started on IV fluids and will be given ondansetron for nausea and morphine for  pain. CT of abdomen and pelvis will be obtained.  He contacted relief of pain with above noted treatment. Laboratory evaluation shows mild elevation of lipase consistent with chronic pancreatitis. He also has a mild anemia which is unchanged from baseline. CT is pending. Case is signed out to Dr. Effie Shy.  Dione Booze, MD 01/24/15 [phone]

## 2015-01-23 NOTE — Discharge Instructions (Signed)
Drink plenty of fluids, and gradually advance your diet.  Your blood count was slightly low. Have your doctor check your hemoglobin next week.   Acute Pancreatitis Acute pancreatitis is a disease in which the pancreas becomes suddenly inflamed. The pancreas is a large gland located behind your stomach. The pancreas produces enzymes that help digest food. The pancreas also releases the hormones glucagon and insulin that help regulate blood sugar. Damage to the pancreas occurs when the digestive enzymes from the pancreas are activated and begin attacking the pancreas before being released into the intestine. Most acute attacks last a couple of days and can cause serious complications. Some people become dehydrated and develop low blood pressure. In severe cases, bleeding into the pancreas can lead to shock and can be life-threatening. The lungs, heart, and kidneys may fail. CAUSES  Pancreatitis can happen to anyone. In some cases, the cause is unknown. Most cases are caused by:  Alcohol abuse.  Gallstones. Other less common causes are:  Certain medicines.  Exposure to certain chemicals.  Infection.  Damage caused by an accident (trauma).  Abdominal surgery. SYMPTOMS   Pain in the upper abdomen that may radiate to the back.  Tenderness and swelling of the abdomen.  Nausea and vomiting. DIAGNOSIS  Your caregiver will perform a physical exam. Blood and stool tests may be done to confirm the diagnosis. Imaging tests may also be done, such as X-rays, CT scans, or an ultrasound of the abdomen. TREATMENT  Treatment usually requires a stay in the hospital. Treatment may include:  Pain medicine.  Fluid replacement through an intravenous line (IV).  Placing a tube in the stomach to remove stomach contents and control vomiting.  Not eating for 3 or 4 days. This gives your pancreas a rest, because enzymes are not being produced that can cause further damage.  Antibiotic medicines if  your condition is caused by an infection.  Surgery of the pancreas or gallbladder. HOME CARE INSTRUCTIONS   Follow the diet advised by your caregiver. This may involve avoiding alcohol and decreasing the amount of fat in your diet.  Eat smaller, more frequent meals. This reduces the amount of digestive juices the pancreas produces.  Drink enough fluids to keep your urine clear or pale yellow.  Only take over-the-counter or prescription medicines as directed by your caregiver.  Avoid drinking alcohol if it caused your condition.  Do not smoke.  Get plenty of rest.  Check your blood sugar at home as directed by your caregiver.  Keep all follow-up appointments as directed by your caregiver. SEEK MEDICAL CARE IF:   You do not recover as quickly as expected.  You develop new or worsening symptoms.  You have persistent pain, weakness, or nausea.  You recover and then have another episode of pain. SEEK IMMEDIATE MEDICAL CARE IF:   You are unable to eat or keep fluids down.  Your pain becomes severe.  You have a fever or persistent symptoms for more than 2 to 3 days.  You have a fever and your symptoms suddenly get worse.  Your skin or the white part of your eyes turn yellow (jaundice).  You develop vomiting.  You feel dizzy, or you faint.  Your blood sugar is high (over 300 mg/dL). MAKE SURE YOU:   Understand these instructions.  Will watch your condition.  Will get help right away if you are not doing well or get worse.   This information is not intended to replace advice given to you  by your health care provider. Make sure you discuss any questions you have with your health care provider.   Document Released: 02/27/2005 Document Revised: 08/29/2011 Document Reviewed: 06/08/2011 Elsevier Interactive Patient Education [employer]

## 2015-01-23 NOTE — ED Notes (Signed)
MD at bedside. 

## 2015-01-23 NOTE — ED Notes (Signed)
Pt having dry heaves, MD aware orders given

## 2015-11-09 ENCOUNTER — Emergency Department (HOSPITAL_COMMUNITY)
Admission: EM | Admit: 2015-11-09 | Discharge: 2015-11-09 | Disposition: A | Discharge status: Home / Self Care | Payer: Medicare Other | Attending: Emergency Medicine | Admitting: Emergency Medicine

## 2015-11-09 ENCOUNTER — Encounter (HOSPITAL_COMMUNITY): Payer: Self-pay | Admitting: Emergency Medicine

## 2015-11-09 DIAGNOSIS — Z79891 Long term (current) use of opiate analgesic: Secondary | ICD-10-CM | POA: Diagnosis not present

## 2015-11-09 DIAGNOSIS — I10 Essential (primary) hypertension: Secondary | ICD-10-CM | POA: Diagnosis not present

## 2015-11-09 DIAGNOSIS — I2510 Atherosclerotic heart disease of native coronary artery without angina pectoris: Secondary | ICD-10-CM | POA: Insufficient documentation

## 2015-11-09 DIAGNOSIS — F4320 Adjustment disorder, unspecified: Secondary | ICD-10-CM | POA: Insufficient documentation

## 2015-11-09 DIAGNOSIS — J45909 Unspecified asthma, uncomplicated: Secondary | ICD-10-CM | POA: Diagnosis not present

## 2015-11-09 DIAGNOSIS — Z7982 Long term (current) use of aspirin: Secondary | ICD-10-CM | POA: Insufficient documentation

## 2015-11-09 DIAGNOSIS — F17210 Nicotine dependence, cigarettes, uncomplicated: Secondary | ICD-10-CM | POA: Diagnosis not present

## 2015-11-09 DIAGNOSIS — E039 Hypothyroidism, unspecified: Secondary | ICD-10-CM | POA: Diagnosis not present

## 2015-11-09 DIAGNOSIS — Z046 Encounter for general psychiatric examination, requested by authority: Secondary | ICD-10-CM | POA: Diagnosis present

## 2015-11-09 DIAGNOSIS — Z79899 Other long term (current) drug therapy: Secondary | ICD-10-CM | POA: Insufficient documentation

## 2015-11-09 LAB — CBC WITH DIFFERENTIAL/PLATELET
BASOS PCT: 0 %
Basophils Absolute: 0 10*3/uL (ref 0.0–0.1)
EOS ABS: 0.1 10*3/uL (ref 0.0–0.7)
EOS PCT: 1 %
HEMATOCRIT: 41.9 % (ref 39.0–52.0)
Hemoglobin: 14.2 g/dL (ref 13.0–17.0)
Lymphocytes Relative: 22 %
Lymphs Abs: 1.3 10*3/uL (ref 0.7–4.0)
MCH: 29.3 pg (ref 26.0–34.0)
MCHC: 33.9 g/dL (ref 30.0–36.0)
MCV: 86.4 fL (ref 78.0–100.0)
MONO ABS: 0.5 10*3/uL (ref 0.1–1.0)
MONOS PCT: 8 %
NEUTROS ABS: 4 10*3/uL (ref 1.7–7.7)
Neutrophils Relative %: 69 %
PLATELETS: 166 10*3/uL (ref 150–400)
RBC: 4.85 MIL/uL (ref 4.22–5.81)
RDW: 13.9 % (ref 11.5–15.5)
WBC: 5.8 10*3/uL (ref 4.0–10.5)

## 2015-11-09 LAB — COMPREHENSIVE METABOLIC PANEL
ALBUMIN: 4.6 g/dL (ref 3.5–5.0)
ALK PHOS: 61 U/L (ref 38–126)
ALT: 19 U/L (ref 17–63)
AST: 15 U/L (ref 15–41)
Anion gap: 9 (ref 5–15)
BILIRUBIN TOTAL: 0.7 mg/dL (ref 0.3–1.2)
BUN: 15 mg/dL (ref 6–20)
CALCIUM: 9.6 mg/dL (ref 8.9–10.3)
CO2: 24 mmol/L (ref 22–32)
CREATININE: 1.02 mg/dL (ref 0.61–1.24)
Chloride: 109 mmol/L (ref 101–111)
GFR calc Af Amer: >60 mL/min (ref >60)
GLUCOSE: 92 mg/dL (ref 65–99)
Potassium: 3.3 mmol/L — ABNORMAL LOW (ref 3.5–5.1)
Sodium: 142 mmol/L (ref 135–145)
TOTAL PROTEIN: 8 g/dL (ref 6.5–8.1)

## 2015-11-09 LAB — RAPID URINE DRUG SCREEN, HOSP PERFORMED
Amphetamines: NOT DETECTED
BARBITURATES: NOT DETECTED
Benzodiazepines: NOT DETECTED
COCAINE: NOT DETECTED
Opiates: NOT DETECTED
Tetrahydrocannabinol: POSITIVE — AB

## 2015-11-09 LAB — ETHANOL: Alcohol, Ethyl (B): 8 mg/dL — ABNORMAL HIGH (ref <5)

## 2015-11-09 NOTE — ED Triage Notes (Signed)
Pt was in argument with wife and he wants to kill himself. Pt upset due to wife not letting him take care of his kids.

## 2015-11-09 NOTE — BH Assessment (Signed)
Tele Assessment Note   Justin [name] is an 60 y.o. male. Pt arrived to the ED voluntarily due to expressing S/I. Pt reports he got into an altercation with his wife who refused to leave him alone so he informed her he was going to kill himself if she did not leave him alone. Pt denies any current SI/HI and reports that he was not serious about wanting to kill himself. During the assessment, the pt's disposition appeared to be pleasant as he was laughing with the assessor and making jokes about the food he was being served at the hospital. The pt states "I don't know why they brought me here." Pt reports he has never received any OPT or INPT therapy and denies any prior mental health diagnosis. Pt reports he smokes marijuana daily to help with his pancreatitis and when asked about the amount he stated "as much as he can get his hands on." Pt denies current alcohol abuse and states he has not had anything to drink in the last 3 years. The toxicology report shows positive for alcohol, however pt denied.   Pt reports he has issues with his wife due to financial problems and pt sending his children money. Pt reports his triggers including conflicts with his wife and stated he becomes angry when she tells him what he can do with his money because he is not used to having someone tell him what to do with his money. Pt reported some symptoms of depression and stated he sometimes feels hopeless because of the marital issues with his wife. Pt reports he has insomnia and has a difficult time falling and staying asleep and reports his daughter has the same issue. Pt denies any past history of abuse or trauma. Per Donell Sievert, PA pt does not meet inpatient criteria. Pt has been sent a "no harm contract" and discharge has been recommended. Form has been sent via fax.  Rolland Porter, MD has been notified.   Diagnosis: Major Depressive Disorder   Past Medical History:  Past Medical History:  Diagnosis Date    Alcoholism (HCC)    Asthma    Back pain    Fibromyalgia    GERD (gastroesophageal reflux disease)    High cholesterol    Hypertension    Hypothyroidism    Myocardial infarct [facility])    cardiologist in town   Pancreatitis    with pseudocysts   Pneumonia    Stroke [facility]    right leg weakness    Past Surgical History:  Procedure Laterality Date   BACK SURGERY     CHOLECYSTECTOMY  2009   FINGER SURGERY     PANCREATIC PSEUDOCYST DRAINAGE  2014    Family History:  Family History  Problem Relation Age of Onset   Diabetes Mother    Diabetes Brother    Diabetes Brother    Cancer Father     ? stomach   Pancreatitis Neg Hx     Social History:  reports that he has been smoking Cigarettes.  He has been smoking about 0.40 packs per day. He has never used smokeless tobacco. He reports that he uses drugs, including Marijuana. He reports that he does not drink alcohol.  Additional Social History:  Alcohol / Drug Use Pain Medications: Pt denies abuse, see PTA meds. Prescriptions: Pt denies abuse, see PTA meds. Over the Counter: Pt denies abuse, see PTA meds. History of alcohol / drug use?: Yes Substance #1 Name of Substance 1: Marijuana  1 -  Age of First Use: 18 1 - Amount (size/oz): Pt reports "whatever he can get his hands on." 1 - Frequency: Pt reports "whenever he can." 1 - Duration: weekly 1 - Last Use / Amount: unknown  CIWA: CIWA-Ar BP: 155/91 Pulse Rate: 88 COWS:    PATIENT STRENGTHS: (choose at least two) Active sense of humor Communication skills Work skills  Allergies:  Allergies  Allergen Reactions   Amoxicillin Hives and Swelling   Dilaudid [Hydromorphone Hcl] Swelling   Hydromorphone Swelling    Home Medications:  (Not in a hospital admission)  OB/GYN Status:  No LMP for male patient.  General Assessment Data Location of Assessment: [facility] TTS Assessment: In system Is this a Tele or Face-to-Face Assessment?: Tele  Assessment Is this an Initial Assessment or a Re-assessment for this encounter?: Initial Assessment Marital status: Married Is patient pregnant?: No Pregnancy Status: No Living Arrangements: Spouse/significant other Can pt return to current living arrangement?: Yes Admission Status: Voluntary Is patient capable of signing voluntary admission?: Yes Referral Source: Self/Family/Friend Insurance type: [facility]     Crisis Care Plan Living Arrangements: Spouse/significant other  Education Status Highest grade of school patient has completed: 9th  Risk to self with the past 6 months Suicidal Ideation: No-Not Currently/Within Last 6 Months (pt reports said he would kill himself due to fight w/ wife) Has patient been a risk to self within the past 6 months prior to admission? : No Suicidal Intent: No Has patient had any suicidal intent within the past 6 months prior to admission? : Yes (pt currently denies and reports he "was not serious.") Is patient at risk for suicide?: No Suicidal Plan?: No Has patient had any suicidal plan within the past 6 months prior to admission? : No Access to Means: No What has been your use of drugs/alcohol within the last 12 months?: pt reports he smokes marijuana "whenever he can get his hands on it." Previous Attempts/Gestures: No Triggers for Past Attempts: Spouse contact (conflict with wife) Intentional Self Injurious Behavior: None Family Suicide History: No Recent stressful life event(s): Conflict (Comment), Financial Problems (pt reports wife complains she "pays all the bills.") Persecutory voices/beliefs?: No Depression: Yes Depression Symptoms: Insomnia, Feeling worthless/self pity Substance abuse history and/or treatment for substance abuse?: No Suicide prevention information given to non-admitted patients: Not applicable  Risk to Others within the past 6 months Homicidal Ideation: No Does patient have any lifetime risk of violence toward others  beyond the six months prior to admission? : No Thoughts of Harm to Others: No Current Homicidal Intent: No Current Homicidal Plan: No Access to Homicidal Means: No History of harm to others?: No Assessment of Violence: None Noted Does patient have access to weapons?: No Criminal Charges Pending?: No Does patient have a court date: No Is patient on probation?: No  Psychosis Hallucinations: None noted Delusions: None noted  Mental Status Report Appearance/Hygiene: Unremarkable Eye Contact: Good Motor Activity: Freedom of movement Speech: Logical/coherent Level of Consciousness: Alert Mood: Depressed Affect: Appropriate to circumstance (pt reports symptons of depression but was smiling during ) Anxiety Level: None Thought Processes: Coherent, Relevant Judgement: Unimpaired Orientation: Person, Place, Situation, Time Obsessive Compulsive Thoughts/Behaviors: None  Cognitive Functioning Concentration: Normal Memory: Recent Intact, Remote Intact IQ: Average Insight: Fair Impulse Control: Fair Appetite: Poor (pt reports he eats little due to pancreatitis) Sleep: Decreased Total Hours of Sleep: 4 Vegetative Symptoms: None     Prior Inpatient Therapy Prior Inpatient Therapy: No  Prior Outpatient Therapy Prior Outpatient Therapy:  No Does patient have an ACCT team?: No Does patient have Intensive In-House Services?  : No Does patient have [facility] services? : No Does patient have P4CC services?: No  ADL Screening (condition at time of admission) Is the patient deaf or have difficulty hearing?: No Does the patient have difficulty seeing, even when wearing glasses/contacts?: No Does the patient have difficulty concentrating, remembering, or making decisions?: No Does the patient have difficulty dressing or bathing?: No Does the patient have difficulty walking or climbing stairs?: No Weakness of Legs: None Weakness of Arms/Hands: None  Home Assistive  Devices/Equipment Home Assistive Devices/Equipment: None    Abuse/Neglect Assessment (Assessment to be complete while patient is alone) Physical Abuse: Denies Verbal Abuse: Denies Sexual Abuse: Denies Exploitation of patient/patient's resources: Denies Self-Neglect: Denies     Merchant navy officer (For Healthcare) Does patient have an advance directive?: No Would patient like information on creating an advanced directive?: No - patient declined information    Additional Information 1:1 In Past 12 Months?: No CIRT Risk: No Elopement Risk: No     Disposition: per Donell Sievert, PA pt must be willing to sign no harm contract. Fax received via [phone] Disposition Initial Assessment Completed for this Encounter: Yes Disposition of Patient: Other dispositions (per Donell Sievert, PA pt must be willing to sign no harm con) Other disposition(s): Information only  Karolee Ohs 11/09/2015 9:51 PM

## 2015-11-09 NOTE — ED Notes (Signed)
Pts belongings labeled and placed in lockers. Pt dressed out in purple scrubs.

## 2015-11-09 NOTE — ED Notes (Signed)
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  EDP Fayrene Fearing working on discharge papers and security is being called to bring personal items, patient is anxious to leave.

## 2015-11-09 NOTE — ED Notes (Addendum)
Patient was given discharge papers, clothing and personal belongings from security, patient was given a ride home by [place] [employer].

## 2015-11-09 NOTE — ED Provider Notes (Signed)
[facility]PT Provider Note   CSN: [id number] Arrival date & time: 11/09/15  1929     History   Chief Complaint Chief Complaint  Patient presents with  . V70.1    HPI Justin [name] is a 60 y.o. male.  He had an altercation verbally with his wife tonight. He paints a picture of a long feud between he and his wife about how they interact regarding their sons, and financially. He states he became very frustrated tonight during an argument and he left the house. He took his gun in his back pocket. He states that this is not unusual for him. He states that she confronted him about that. He states he was "pissed off". He states to me that I told her "what in the hell you think I'm going to do, kill myself?".  She called 911. He was brought in police custody in handcuffs.  HPI  Past Medical History:  Diagnosis Date  . Alcoholism (HCC)   . Asthma   . Back pain   . Fibromyalgia   . GERD (gastroesophageal reflux disease)   . High cholesterol   . Hypertension   . Hypothyroidism   . Myocardial infarct [facility])    cardiologist in town  . Pancreatitis    with pseudocysts  . Pneumonia   . Stroke [facility])    right leg weakness    Patient Active Problem List   Diagnosis Date Noted  . Other pancytopenia (HCC) 02/06/2013  . Pseudocyst, pancreas 02/03/2013  . Acute pancreatitis 06/28/2012  . Pancreatic abscess 06/28/2012  . Chest pain epsode 4/17 - relieved with NTG 06/28/2012  . SIRS (systemic inflammatory response syndrome) (HCC) 06/27/2012  . Acute respiratory failure (HCC) 06/26/2012  . Unspecified protein-calorie malnutrition (HCC) 06/26/2012  . 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 Elevated triglycerides with high cholesterol 06/10/2012  . Unspecified hypothyroidism 06/07/2012  . Depressive disorder, not elsewhere classified 06/07/2012  . CAD  (coronary artery disease) of artery bypass graft 05/30/2012  . Hypokalemia 05/30/2012  . Abnormal LFTs (liver function tests) 05/30/2012  . Back pain   . Stroke (HCC)   . Fibromyalgia   . Myocardial infarct [facility])     Past Surgical History:  Procedure Laterality Date  . BACK SURGERY    . CHOLECYSTECTOMY  2009  . FINGER SURGERY    . PANCREATIC PSEUDOCYST DRAINAGE  2014       Home Medications    Prior to Admission medications   Medication Sig Start Date End Date Taking? Authorizing Provider  albuterol (PROAIR HFA) 108 (90 Base) MCG/ACT inhaler Inhale 1-2 puffs into the lungs every 6 (six) hours as needed for wheezing or shortness of breath.   Yes Historical Provider, MD  aspirin EC 81 MG tablet Take 81 mg by mouth every morning.   Yes Historical Provider, MD  clonazePAM (KLONOPIN) 0.5 MG tablet Take 0.5 mg by mouth at bedtime. *May take one tablet three times daily as needed for anxiety* 12/14/14  Yes Historical Provider, MD  Cyanocobalamin (VITAMIN B-12 PO) Take 1 tablet by mouth daily.   Yes Historical Provider, MD  diphenhydrAMINE (ALLERGY MEDICATION) 25 MG tablet Take 25 mg by mouth every morning.   Yes Historical Provider, MD  DULoxetine (CYMBALTA) 60 MG capsule Take 60 mg by mouth every morning.    Yes Historical Provider, MD  Fenofibric Acid 105 MG TABS Take 105 mg by mouth  at bedtime.  12/28/14  Yes Historical Provider, MD  finasteride (PROSCAR) 5 MG tablet Take 5 mg by mouth every morning.    Yes Historical Provider, MD  gabapentin (NEURONTIN) 600 MG tablet Take 600 mg by mouth 3 (three) times daily.   Yes Historical Provider, MD  levothyroxine (SYNTHROID, LEVOTHROID) 112 MCG tablet Take 112 mcg by mouth every morning.    Yes Historical Provider, MD  Multiple Vitamins-Minerals (MULTIVITAMIN ADULT PO) Take 1 tablet by mouth daily.   Yes Historical Provider, MD  Omega-3 Fatty Acids (FISH OIL PO) Take 1 capsule by mouth daily.   Yes Historical Provider, MD    oxyCODONE-acetaminophen (PERCOCET/ROXICET) 5-325 MG tablet Take 1 tablet by mouth daily as needed for moderate pain or severe pain (associated with pancreas (pain)).   Yes Historical Provider, MD  tamsulosin (FLOMAX) 0.4 MG CAPS capsule Take 0.4 mg by mouth 2 (two) times daily.  12/28/14  Yes Historical Provider, MD  tiZANidine (ZANAFLEX) 4 MG tablet Take 4 mg by mouth 3 (three) times daily.    Yes Historical Provider, MD    Family History Family History  Problem Relation Age of Onset  . Diabetes Mother   . Diabetes Brother   . Diabetes Brother   . Cancer Father     ? stomach  . Pancreatitis Neg Hx     Social History Social History  Substance Use Topics  . Smoking status: Current Some Day Smoker    Packs/day: 0.40    Types: Cigarettes  . Smokeless tobacco: Never Used  . Alcohol use No     Comment: previously used alcohol and quit 6 years ago     Allergies   Amoxicillin; Dilaudid [hydromorphone hcl]; and Hydromorphone   Review of Systems Review of Systems  Constitutional: Negative for appetite change, chills, diaphoresis, fatigue and fever.  HENT: Negative for mouth sores, sore throat and trouble swallowing.   Eyes: Negative for visual disturbance.  Respiratory: Negative for cough, chest tightness, shortness of breath and wheezing.   Cardiovascular: Negative for chest pain.  Gastrointestinal: Negative for abdominal distention, abdominal pain, diarrhea, nausea and vomiting.  Endocrine: Negative for polydipsia, polyphagia and polyuria.  Genitourinary: Negative for dysuria, frequency and hematuria.  Musculoskeletal: Negative for gait problem.  Skin: Negative for color change, pallor and rash.  Neurological: Negative for dizziness, syncope, light-headedness and headaches.  Hematological: Does not bruise/bleed easily.  Psychiatric/Behavioral: Negative for behavioral problems and confusion.     Physical Exam Updated Vital Signs BP 138/84 (BP Location: Left Arm)   Pulse  (!) 58   Temp 98.9 F (37.2 C) (Oral)   Resp 18   Ht 5\' 7"  (1.702 m)   Wt 156 lb (70.8 kg)   SpO2 96%   BMI 24.43 kg/m   Physical Exam  Constitutional: He is oriented to person, place, and time. He appears well-developed and well-nourished. No distress.  HENT:  Head: Normocephalic.  Eyes: Conjunctivae are normal. Pupils are equal, round, and reactive to light. No scleral icterus.  Neck: Normal range of motion. Neck supple. No thyromegaly present.  Cardiovascular: Normal rate and regular rhythm.  Exam reveals no gallop and no friction rub.   No murmur heard. Pulmonary/Chest: Effort normal and breath sounds normal. No respiratory distress. He has no wheezes. He has no rales.  Abdominal: Soft. Bowel sounds are normal. He exhibits no distension. There is no tenderness. There is no rebound.  Musculoskeletal: Normal range of motion.  Neurological: He is alert and oriented to person, place,  and time.  Skin: Skin is warm and dry. No rash noted.  Psychiatric: He has a normal mood and affect. His behavior is normal.  He has normal mood and affect. He denies being suicidal. He seems somewhat irritated by the process of being here but is agreeable to undergo evaluation and is cooperative.     ED Treatments / Results  Labs (all labs ordered are listed, but only abnormal results are displayed) Labs Reviewed  COMPREHENSIVE METABOLIC PANEL - Abnormal; Notable for the following:       Result Value   Potassium 3.3 (*)    All other components within normal limits  ETHANOL - Abnormal; Notable for the following:    Alcohol, Ethyl (B) 8 (*)    All other components within normal limits  URINE RAPID DRUG SCREEN, HOSP PERFORMED - Abnormal; Notable for the following:    Tetrahydrocannabinol POSITIVE (*)    All other components within normal limits  CBC WITH DIFFERENTIAL/PLATELET    EKG  EKG Interpretation None       Radiology No results found.  Procedures Procedures (including critical  care time)  Medications Ordered in ED Medications - No data to display   Initial Impression / Assessment and Plan / ED Course  I have reviewed the triage vital signs and the nursing notes.  Pertinent labs & imaging results that were available during my care of the patient were reviewed by me and considered in my medical decision making (see chart for details).  Clinical Course    Patient remains adamant here that he is not suicidal. He is evaluated by asked team. He continues to state no suicidal ideations. The recommendation is discharged. He is not intoxicated. He is able to contract for safety. His discharge.  Final Clinical Impressions(s) / ED Diagnoses   Final diagnoses:  Emotional crisis    New Prescriptions New Prescriptions   No medications on file     Rolland Porter, MD 11/09/15 2224

## 2015-11-09 NOTE — Discharge Instructions (Signed)
Recheck with your primary care physician.  Return to ER as needed for any additional psychiatric or mental health issues

## 2015-12-12 ENCOUNTER — Emergency Department (HOSPITAL_COMMUNITY): Payer: Medicare Other

## 2015-12-12 ENCOUNTER — Encounter (HOSPITAL_COMMUNITY): Payer: Self-pay | Admitting: Emergency Medicine

## 2015-12-12 ENCOUNTER — Emergency Department (HOSPITAL_COMMUNITY)
Admission: EM | Admit: 2015-12-12 | Discharge: 2015-12-12 | Disposition: A | Discharge status: Home / Self Care | Payer: Medicare Other | Attending: Emergency Medicine | Admitting: Emergency Medicine

## 2015-12-12 DIAGNOSIS — I10 Essential (primary) hypertension: Secondary | ICD-10-CM | POA: Diagnosis not present

## 2015-12-12 DIAGNOSIS — I2510 Atherosclerotic heart disease of native coronary artery without angina pectoris: Secondary | ICD-10-CM | POA: Diagnosis not present

## 2015-12-12 DIAGNOSIS — R112 Nausea with vomiting, unspecified: Secondary | ICD-10-CM | POA: Insufficient documentation

## 2015-12-12 DIAGNOSIS — R1084 Generalized abdominal pain: Secondary | ICD-10-CM

## 2015-12-12 DIAGNOSIS — R197 Diarrhea, unspecified: Secondary | ICD-10-CM | POA: Diagnosis not present

## 2015-12-12 DIAGNOSIS — Z7982 Long term (current) use of aspirin: Secondary | ICD-10-CM | POA: Diagnosis not present

## 2015-12-12 DIAGNOSIS — E039 Hypothyroidism, unspecified: Secondary | ICD-10-CM | POA: Insufficient documentation

## 2015-12-12 DIAGNOSIS — Z79899 Other long term (current) drug therapy: Secondary | ICD-10-CM | POA: Insufficient documentation

## 2015-12-12 DIAGNOSIS — Z87891 Personal history of nicotine dependence: Secondary | ICD-10-CM | POA: Insufficient documentation

## 2015-12-12 DIAGNOSIS — J45909 Unspecified asthma, uncomplicated: Secondary | ICD-10-CM | POA: Insufficient documentation

## 2015-12-12 LAB — URINALYSIS, ROUTINE W REFLEX MICROSCOPIC
Bilirubin Urine: NEGATIVE
GLUCOSE, UA: NEGATIVE mg/dL
HGB URINE DIPSTICK: NEGATIVE
Ketones, ur: NEGATIVE mg/dL
Leukocytes, UA: NEGATIVE
Nitrite: NEGATIVE
Protein, ur: NEGATIVE mg/dL
pH: 7 (ref 5.0–8.0)

## 2015-12-12 LAB — COMPREHENSIVE METABOLIC PANEL
ALK PHOS: 59 U/L (ref 38–126)
ALT: 19 U/L (ref 17–63)
AST: 11 U/L — AB (ref 15–41)
Albumin: 3.8 g/dL (ref 3.5–5.0)
Anion gap: 2 — ABNORMAL LOW (ref 5–15)
BILIRUBIN TOTAL: 0.4 mg/dL (ref 0.3–1.2)
BUN: 13 mg/dL (ref 6–20)
CALCIUM: 8.6 mg/dL — AB (ref 8.9–10.3)
CO2: 26 mmol/L (ref 22–32)
Chloride: 112 mmol/L — ABNORMAL HIGH (ref 101–111)
Creatinine, Ser: 0.94 mg/dL (ref 0.61–1.24)
GFR calc Af Amer: >60 mL/min (ref >60)
GLUCOSE: 132 mg/dL — AB (ref 65–99)
POTASSIUM: 3.4 mmol/L — AB (ref 3.5–5.1)
Sodium: 140 mmol/L (ref 135–145)
TOTAL PROTEIN: 6.9 g/dL (ref 6.5–8.1)

## 2015-12-12 LAB — CBC WITH DIFFERENTIAL/PLATELET
Basophils Absolute: 0 10*3/uL (ref 0.0–0.1)
Basophils Relative: 0 %
EOS PCT: 2 %
Eosinophils Absolute: 0.1 10*3/uL (ref 0.0–0.7)
HCT: 39.8 % (ref 39.0–52.0)
HEMOGLOBIN: 13.2 g/dL (ref 13.0–17.0)
LYMPHS ABS: 0.8 10*3/uL (ref 0.7–4.0)
LYMPHS PCT: 18 %
MCH: 29.1 pg (ref 26.0–34.0)
MCHC: 33.2 g/dL (ref 30.0–36.0)
MCV: 87.9 fL (ref 78.0–100.0)
MONOS PCT: 8 %
Monocytes Absolute: 0.3 10*3/uL (ref 0.1–1.0)
Neutro Abs: 3 10*3/uL (ref 1.7–7.7)
Neutrophils Relative %: 72 %
PLATELETS: 178 10*3/uL (ref 150–400)
RBC: 4.53 MIL/uL (ref 4.22–5.81)
RDW: 15.4 % (ref 11.5–15.5)
WBC: 4.1 10*3/uL (ref 4.0–10.5)

## 2015-12-12 LAB — I-STAT CG4 LACTIC ACID, ED: Lactic Acid, Venous: 0.8 mmol/L (ref 0.5–1.9)

## 2015-12-12 LAB — LIPASE, BLOOD: Lipase: 49 U/L (ref 11–51)

## 2015-12-12 MED ORDER — IOPAMIDOL (ISOVUE-300) INJECTION 61%
100.0000 mL | Freq: Once | INTRAVENOUS | Status: AC | PRN
  Administered 2015-12-12: 100 mL via INTRAVENOUS

## 2015-12-12 MED ORDER — ONDANSETRON HCL 4 MG/2ML IJ SOLN
4.0000 mg | Freq: Once | INTRAMUSCULAR | Status: AC
  Administered 2015-12-12: 4 mg via INTRAVENOUS
  Filled 2015-12-12: qty 2

## 2015-12-12 MED ORDER — IOPAMIDOL (ISOVUE-300) INJECTION 61%
INTRAVENOUS | Status: AC
  Administered 2015-12-12: 30 mL via ORAL
  Filled 2015-12-12: qty 30

## 2015-12-12 MED ORDER — SODIUM CHLORIDE 0.9 % IV SOLN
1000.0000 mL | Freq: Once | INTRAVENOUS | Status: AC
  Administered 2015-12-12: 1000 mL via INTRAVENOUS

## 2015-12-12 MED ORDER — FENTANYL CITRATE (PF) 100 MCG/2ML IJ SOLN
100.0000 ug | Freq: Once | INTRAMUSCULAR | Status: AC
  Administered 2015-12-12: 100 ug via INTRAVENOUS
  Filled 2015-12-12: qty 2

## 2015-12-12 NOTE — ED Notes (Addendum)
Pt deferred all questions to his wife. Wife states the patient has a hx of pancreas issues with a leaky valve. Wife states he has been having pain that is getting progressively worse over time. Wife reports that the patient had pain pills and does not take them. Wife reports that the patient is eating and drinking well.

## 2015-12-12 NOTE — ED Triage Notes (Signed)
Pancreatic surgery several years ago and developed a herina afterwards and stomach hurts every time he eats and its worse today.

## 2015-12-12 NOTE — ED Provider Notes (Signed)
The patient is a 60 year old male, he has a known history of pancreatitis with pancreatic pseudocyst which required a drainage tube in the past, he also has a history of a ventral hernia repair, and a large recurrent ventral hernia. 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 He reports that over the last several days he has had increased amounts of pain, several episodes of vomiting and some diarrhea as well. The patient denies any swelling of the legs, denies fever, denies chest pain or shortness of breath. On exam the patient has abdominal tenderness diffusely, he is not guarding and has no peritoneal signs or masses. His large ventral wall hernia is easily reducible and seems to be tender consistent with the rest of the abdominal exam as well. The family states that he is slightly distended though I do not appreciate a general distention and there is no tympanitic sounds to percussion. Otherwise the patient appears to be well without any edema, tachycardia, JVD and has normal appearing mucous membranes. We'll pursue further evaluation with labs and CT scan imaging to rule out serious signs of intra-abdominal pathology including mesenteric ischemia and pancreatitis as he does state that gets worse with eating, would also consider colitis.  Medical screening examination/treatment/procedure(s) were conducted as a shared visit with non-physician practitioner(s) and myself.  I personally evaluated the patient during the encounter.  Clinical Impression:   Final diagnoses:  Generalized abdominal pain         Eber Hong, MD 12/14/15 1241

## 2015-12-12 NOTE — ED Provider Notes (Signed)
[facility]-EMERGENCY DEPT Provider Note   CSN: [id number] Arrival date & time: 12/12/15  1341     History   Chief Complaint Chief Complaint  Patient presents with  . Abdominal Pain    HPI NOAAH SCHUELE is a 60 y.o. male.  Patient is a 60 year old male with past medical history of pancreatitis with pseudocysts, ventral hernia, and surgical history of cholecystectomy (2009) and pancreatic pseudocyst drainage (2014), presenting to the ED with worsening abdominal pain and vomiting since yesterday evening.The pain is diffuse but worse in his lower abdomen at the site of his hernia. He describes the pain as burning and worse after meals. He also reports chronic diarrhea following his pancreatic surgery. He denies recent fever, chills, chest pain, palpitations, shortness of breath, blood in his stool, or dysuria.    Past Medical History:  Diagnosis Date  . Alcoholism (HCC)   . Asthma   . Back pain   . Fibromyalgia   . GERD (gastroesophageal reflux disease)   . High cholesterol   . Hypertension   . Hypothyroidism   . Myocardial infarct    cardiologist in town  . Pancreatitis    with pseudocysts  . Pneumonia   . Stroke [facility])    right leg weakness    Patient Active Problem List   Diagnosis Date Noted  . Other pancytopenia (HCC) 02/06/2013  . Pseudocyst, pancreas 02/03/2013  . Acute pancreatitis 06/28/2012  . Pancreatic abscess 06/28/2012  . Chest pain epsode 4/17 - relieved with NTG 06/28/2012  . SIRS (systemic inflammatory response syndrome) (HCC) 06/27/2012  . Acute respiratory failure (HCC) 06/26/2012  . Unspecified protein-calorie malnutrition (HCC) 06/26/2012  . 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 Elevated triglycerides with high cholesterol 06/10/2012  . Unspecified hypothyroidism 06/07/2012  . Depressive disorder, not elsewhere classified  06/07/2012  . CAD (coronary artery disease) of artery bypass graft 05/30/2012  . Hypokalemia 05/30/2012  . Abnormal LFTs (liver function tests) 05/30/2012  . Back pain   . Stroke (HCC)   . Fibromyalgia   . Myocardial infarct     Past Surgical History:  Procedure Laterality Date  . BACK SURGERY    . CHOLECYSTECTOMY  2009  . FINGER SURGERY    . PANCREATIC PSEUDOCYST DRAINAGE  2014       Home Medications    Prior to Admission medications   Medication Sig Start Date End Date Taking? Authorizing Provider  albuterol (PROAIR HFA) 108 (90 Base) MCG/ACT inhaler Inhale 1-2 puffs into the lungs every 6 (six) hours as needed for wheezing or shortness of breath.    Historical Provider, MD  aspirin EC 81 MG tablet Take 81 mg by mouth every morning.    Historical Provider, MD  clonazePAM (KLONOPIN) 0.5 MG tablet Take 0.5 mg by mouth at bedtime. *May take one tablet three times daily as needed for anxiety* 12/14/14   Historical Provider, MD  Cyanocobalamin (VITAMIN B-12 PO) Take 1 tablet by mouth daily.    Historical Provider, MD  diphenhydrAMINE (ALLERGY MEDICATION) 25 MG tablet Take 25 mg by mouth every morning.    Historical Provider, MD  DULoxetine (CYMBALTA) 60 MG capsule Take 60 mg by mouth every morning.     Historical Provider, MD  Fenofibric Acid 105 MG TABS Take 105 mg by mouth at bedtime.  12/28/14   Historical Provider, MD  finasteride (PROSCAR) 5 MG tablet Take 5 mg by mouth  every morning.     Historical Provider, MD  gabapentin (NEURONTIN) 600 MG tablet Take 600 mg by mouth 3 (three) times daily.    Historical Provider, MD  levothyroxine (SYNTHROID, LEVOTHROID) 112 MCG tablet Take 112 mcg by mouth every morning.     Historical Provider, MD  Multiple Vitamins-Minerals (MULTIVITAMIN ADULT PO) Take 1 tablet by mouth daily.    Historical Provider, MD  Omega-3 Fatty Acids (FISH OIL PO) Take 1 capsule by mouth daily.    Historical Provider, MD  oxyCODONE-acetaminophen (PERCOCET/ROXICET)  5-325 MG tablet Take 1 tablet by mouth daily as needed for moderate pain or severe pain (associated with pancreas (pain)).    Historical Provider, MD  tamsulosin (FLOMAX) 0.4 MG CAPS capsule Take 0.4 mg by mouth 2 (two) times daily.  12/28/14   Historical Provider, MD  tiZANidine (ZANAFLEX) 4 MG tablet Take 4 mg by mouth 3 (three) times daily.     Historical Provider, MD    Family History Family History  Problem Relation Age of Onset  . Diabetes Mother   . Diabetes Brother   . Diabetes Brother   . Cancer Father     ? stomach  . Pancreatitis Neg Hx     Social History Social History  Substance Use Topics  . Smoking status: Former Smoker    Packs/day: 0.40    Types: Cigarettes  . Smokeless tobacco: Never Used  . Alcohol use No     Comment: previously used alcohol and quit 6 years ago     Allergies   Amoxicillin; Dilaudid [hydromorphone hcl]; and Hydromorphone   Review of Systems Review of Systems  Constitutional: Negative for chills and fever.  Respiratory: Negative for cough and shortness of breath.   Cardiovascular: Negative for chest pain, palpitations and leg swelling.  Gastrointestinal: Positive for abdominal pain, diarrhea (chronic diarrhea), nausea and vomiting. Negative for blood in stool.  Genitourinary: Negative for dysuria and hematuria.  Musculoskeletal: Negative for back pain and neck pain.  Skin: Negative for color change and rash.  Neurological: Negative for dizziness, seizures, syncope and headaches.  All other systems reviewed and are negative.    Physical Exam Updated Vital Signs BP 102/63 (BP Location: Left Arm)   Pulse 66   Temp 97.5 F (36.4 C) (Oral)   Resp 16   Ht 5\' 6"  (1.676 m)   Wt 70.8 kg   SpO2 99%   BMI 25.18 kg/m   Physical Exam  Constitutional: He appears well-developed and well-nourished.  Appears uncomfortable lying in bed, but no acute distress  HENT:  Head: Normocephalic and atraumatic.  Mouth/Throat: Oropharynx is clear  and moist.  Eyes: Conjunctivae are normal.  Neck: Normal range of motion. Neck supple.  Cardiovascular: Normal rate, regular rhythm, normal heart sounds and intact distal pulses.   Pulmonary/Chest: Effort normal and breath sounds normal. No respiratory distress.  Abdominal: Soft. Bowel sounds are normal.  Large ventral hernia present, easily reducible, no hard masses felt. TTP at LUQ and site of hernia, but no guarding. Neg Murphy's, neg McBurney's, no peritoneal signs, and no CVA tenderness.  Musculoskeletal: Normal range of motion. He exhibits no edema or tenderness.  Neurological: He is alert.  Skin: Skin is warm and dry.  Psychiatric: He has a normal mood and affect.  Nursing note and vitals reviewed.    ED Treatments / Results  Labs (all labs ordered are listed, but only abnormal results are displayed) Labs Reviewed  LIPASE, BLOOD  COMPREHENSIVE METABOLIC PANEL  CBC  URINALYSIS, ROUTINE W REFLEX MICROSCOPIC (NOT AT [facility])    EKG  EKG Interpretation None       Radiology No results found.  Procedures Procedures (including critical care time)  Medications Ordered in ED Medications - No data to display   Initial Impression / Assessment and Plan / ED Course  I have reviewed the triage vital signs and the nursing notes.  Pertinent labs & imaging results that were available during my care of the patient were reviewed by me and considered in my medical decision making (see chart for details).  Clinical Course   Patient is 60 yo M with hx of pancreatitis and ventral hernia, presenting with worsening abdominal pain and vomiting since yesterday evening. On physical exam, hernia was easily reducible, abdomen is soft, and exhibits no peritoneal signs, but CT abdomen ordered to r/o acute pathology including incarcerated hernia, pancreatitis, appendicitis, and bowel obstruction. CBC, CMP, lipase, lactic acid, and urinalysis ordered. Patient is NPO and received IVF, 4 mg IV  zofran, and 100 mcg fentanyl for pain. Attending physician, Dr. Eber Hong, saw patient and agrees with assessment and plan. At time of shift change, the only resulted lab was a lactic acid of 0.80. Updated patient on change of treatment team, and signed out to Dr. Adalberto Cole who is also in agreement with current plan.  Final Clinical Impressions(s) / ED Diagnoses   Final diagnoses:  None    New Prescriptions New Prescriptions   No medications on file     Jari Pigg II, PA 12/12/15 1603    Eber Hong, MD 12/14/15 1241

## 2015-12-12 NOTE — Discharge Instructions (Signed)
As discussed, your evaluation today has been largely reassuring.  But, it is important that you monitor your condition carefully, and do not hesitate to return to the ED if you develop new, or concerning changes in your condition. ? ?Otherwise, please follow-up with your physician for appropriate ongoing care. ? ?

## 2016-01-17 ENCOUNTER — Other Ambulatory Visit: Payer: Self-pay | Admitting: Nurse Practitioner

## 2016-01-17 DIAGNOSIS — R51 Headache: Secondary | ICD-10-CM

## 2016-01-17 DIAGNOSIS — R4586 Emotional lability: Secondary | ICD-10-CM

## 2016-01-17 DIAGNOSIS — R519 Headache, unspecified: Secondary | ICD-10-CM

## 2016-01-17 DIAGNOSIS — R42 Dizziness and giddiness: Secondary | ICD-10-CM

## 2016-01-22 ENCOUNTER — Inpatient Hospital Stay: Admission: RE | Admit: 2016-01-22 | Payer: Medicare Other | Source: Ambulatory Visit

## 2016-01-29 ENCOUNTER — Ambulatory Visit
Admission: RE | Admit: 2016-01-29 | Discharge: 2016-01-29 | Disposition: A | Discharge status: Home / Self Care | Payer: Medicare Other | Source: Ambulatory Visit | Attending: Nurse Practitioner | Admitting: Nurse Practitioner

## 2016-01-29 DIAGNOSIS — R51 Headache: Secondary | ICD-10-CM

## 2016-01-29 DIAGNOSIS — R4586 Emotional lability: Secondary | ICD-10-CM

## 2016-01-29 DIAGNOSIS — R519 Headache, unspecified: Secondary | ICD-10-CM

## 2016-01-29 DIAGNOSIS — R42 Dizziness and giddiness: Secondary | ICD-10-CM

## 2016-02-01 ENCOUNTER — Emergency Department (HOSPITAL_COMMUNITY)
Admission: EM | Admit: 2016-02-01 | Discharge: 2016-02-01 | Disposition: A | Discharge status: Home / Self Care | Payer: Medicare Other | Attending: Emergency Medicine | Admitting: Emergency Medicine

## 2016-02-01 ENCOUNTER — Encounter (HOSPITAL_COMMUNITY): Payer: Self-pay | Admitting: Emergency Medicine

## 2016-02-01 ENCOUNTER — Emergency Department (HOSPITAL_COMMUNITY): Payer: Medicare Other

## 2016-02-01 DIAGNOSIS — R1013 Epigastric pain: Secondary | ICD-10-CM | POA: Diagnosis present

## 2016-02-01 DIAGNOSIS — Z7982 Long term (current) use of aspirin: Secondary | ICD-10-CM | POA: Diagnosis not present

## 2016-02-01 DIAGNOSIS — I2510 Atherosclerotic heart disease of native coronary artery without angina pectoris: Secondary | ICD-10-CM | POA: Diagnosis not present

## 2016-02-01 DIAGNOSIS — K860 Alcohol-induced chronic pancreatitis: Secondary | ICD-10-CM | POA: Diagnosis not present

## 2016-02-01 DIAGNOSIS — J45909 Unspecified asthma, uncomplicated: Secondary | ICD-10-CM | POA: Diagnosis not present

## 2016-02-01 DIAGNOSIS — E039 Hypothyroidism, unspecified: Secondary | ICD-10-CM | POA: Insufficient documentation

## 2016-02-01 DIAGNOSIS — I10 Essential (primary) hypertension: Secondary | ICD-10-CM | POA: Diagnosis not present

## 2016-02-01 DIAGNOSIS — Z79899 Other long term (current) drug therapy: Secondary | ICD-10-CM | POA: Insufficient documentation

## 2016-02-01 DIAGNOSIS — Z87891 Personal history of nicotine dependence: Secondary | ICD-10-CM | POA: Insufficient documentation

## 2016-02-01 LAB — COMPREHENSIVE METABOLIC PANEL
ALBUMIN: 3.9 g/dL (ref 3.5–5.0)
ALT: 29 U/L (ref 17–63)
ANION GAP: 6 (ref 5–15)
AST: 18 U/L (ref 15–41)
Alkaline Phosphatase: 52 U/L (ref 38–126)
BILIRUBIN TOTAL: 0.8 mg/dL (ref 0.3–1.2)
BUN: 13 mg/dL (ref 6–20)
CHLORIDE: 107 mmol/L (ref 101–111)
CO2: 25 mmol/L (ref 22–32)
Calcium: 8.9 mg/dL (ref 8.9–10.3)
Creatinine, Ser: 0.99 mg/dL (ref 0.61–1.24)
GFR calc Af Amer: >60 mL/min (ref >60)
GFR calc non Af Amer: >60 mL/min (ref >60)
GLUCOSE: 87 mg/dL (ref 65–99)
POTASSIUM: 3.4 mmol/L — AB (ref 3.5–5.1)
SODIUM: 138 mmol/L (ref 135–145)
TOTAL PROTEIN: 7 g/dL (ref 6.5–8.1)

## 2016-02-01 LAB — URINALYSIS, ROUTINE W REFLEX MICROSCOPIC
Bilirubin Urine: NEGATIVE
GLUCOSE, UA: NEGATIVE mg/dL
Hgb urine dipstick: NEGATIVE
KETONES UR: NEGATIVE mg/dL
LEUKOCYTES UA: NEGATIVE
NITRITE: NEGATIVE
PH: 5.5 (ref 5.0–8.0)
PROTEIN: NEGATIVE mg/dL
Specific Gravity, Urine: 1.025 (ref 1.005–1.030)

## 2016-02-01 LAB — CBC WITH DIFFERENTIAL/PLATELET
BASOS ABS: 0 10*3/uL (ref 0.0–0.1)
BASOS PCT: 0 %
EOS ABS: 0.1 10*3/uL (ref 0.0–0.7)
EOS PCT: 2 %
HEMATOCRIT: 36.4 % — AB (ref 39.0–52.0)
Hemoglobin: 12.1 g/dL — ABNORMAL LOW (ref 13.0–17.0)
Lymphocytes Relative: 18 %
Lymphs Abs: 1.1 10*3/uL (ref 0.7–4.0)
MCH: 29.2 pg (ref 26.0–34.0)
MCHC: 33.2 g/dL (ref 30.0–36.0)
MCV: 87.9 fL (ref 78.0–100.0)
MONO ABS: 0.5 10*3/uL (ref 0.1–1.0)
MONOS PCT: 8 %
NEUTROS ABS: 4.3 10*3/uL (ref 1.7–7.7)
Neutrophils Relative %: 72 %
PLATELETS: 156 10*3/uL (ref 150–400)
RBC: 4.14 MIL/uL — ABNORMAL LOW (ref 4.22–5.81)
RDW: 15 % (ref 11.5–15.5)
WBC: 6 10*3/uL (ref 4.0–10.5)

## 2016-02-01 LAB — LIPASE, BLOOD: Lipase: 19 U/L (ref 11–51)

## 2016-02-01 MED ORDER — IOPAMIDOL (ISOVUE-300) INJECTION 61%
100.0000 mL | Freq: Once | INTRAVENOUS | Status: AC | PRN
Start: 2016-02-01 — End: 2016-02-01
  Administered 2016-02-01: 100 mL via INTRAVENOUS

## 2016-02-01 MED ORDER — ONDANSETRON HCL 4 MG/2ML IJ SOLN
4.0000 mg | Freq: Once | INTRAMUSCULAR | Status: AC
  Administered 2016-02-01: 4 mg via INTRAVENOUS
  Filled 2016-02-01: qty 2

## 2016-02-01 MED ORDER — OXYCODONE-ACETAMINOPHEN 5-325 MG PO TABS: 1.0000 | ORAL_TABLET | Freq: Four times a day (QID) | ORAL | 0 refills | Status: DC | PRN

## 2016-02-01 MED ORDER — MORPHINE SULFATE (PF) 4 MG/ML IV SOLN
4.0000 mg | Freq: Once | INTRAVENOUS | Status: AC
  Administered 2016-02-01: 4 mg via INTRAVENOUS
  Filled 2016-02-01: qty 1

## 2016-02-01 MED ORDER — ONDANSETRON 4 MG PO TBDP: 4.0000 mg | ORAL_TABLET | Freq: Three times a day (TID) | ORAL | 0 refills | Status: DC | PRN

## 2016-02-01 MED ORDER — SODIUM CHLORIDE 0.9 % IV BOLUS (SEPSIS)
1000.0000 mL | Freq: Once | INTRAVENOUS | Status: AC
  Administered 2016-02-01: 1000 mL via INTRAVENOUS

## 2016-02-01 NOTE — ED Triage Notes (Signed)
Pt c/o abd pain with n/v since Saturday.

## 2016-02-01 NOTE — ED Provider Notes (Signed)
[facility] DEPT Provider Note   CSN: [id number] Arrival date & time: 02/01/16  0109     History   Chief Complaint Chief Complaint  Patient presents with  . Abdominal Pain    HPI Justin [name] is a 60 y.o. male.  HPI  This is a 60 year old male with a history of pancreatitis who presents with abdominal pain. Patient reports abdominal pain ongoing since Saturday. He reports that it is epigastric. Currently 8 out of 10. Nothing seems to make it better or worse. He reports nonbilious, nonbloody emesis. Last normal bowel movement was yesterday. Reports pain is consistent with prior episodes of pancreatitis. Denies current alcohol use.  Past Medical History:  Diagnosis Date  . Alcoholism (HCC)   . Asthma   . Back pain   . Fibromyalgia   . GERD (gastroesophageal reflux disease)   . High cholesterol   . Hypertension   . Hypothyroidism   . Myocardial infarct    cardiologist in town  . Pancreatitis    with pseudocysts  . Pneumonia   . Stroke [facility])    right leg weakness    Patient Active Problem List   Diagnosis Date Noted  . Other pancytopenia (HCC) 02/06/2013  . Pseudocyst, pancreas 02/03/2013  . Acute pancreatitis 06/28/2012  . Pancreatic abscess 06/28/2012  . Chest pain epsode 4/17 - relieved with NTG 06/28/2012  . SIRS (systemic inflammatory response syndrome) (HCC) 06/27/2012  . Acute respiratory failure (HCC) 06/26/2012  . Unspecified protein-calorie malnutrition 06/26/2012  . 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 Elevated triglycerides with high cholesterol 06/10/2012  . Unspecified hypothyroidism 06/07/2012  . Depressive disorder, not elsewhere classified 06/07/2012  . CAD (coronary artery disease) of artery bypass graft 05/30/2012  . Hypokalemia 05/30/2012  . Abnormal LFTs (liver function tests) 05/30/2012  . Back pain   . Stroke  (HCC)   . Fibromyalgia   . Myocardial infarct     Past Surgical History:  Procedure Laterality Date  . BACK SURGERY    . CHOLECYSTECTOMY  2009  . FINGER SURGERY    . PANCREATIC PSEUDOCYST DRAINAGE  2014       Home Medications    Prior to Admission medications   Medication Sig Start Date End Date Taking? Authorizing Provider  albuterol (PROAIR HFA) 108 (90 Base) MCG/ACT inhaler Inhale 1-2 puffs into the lungs every 6 (six) hours as needed for wheezing or shortness of breath.   Yes Historical Provider, MD  aspirin EC 81 MG tablet Take 81 mg by mouth every morning.   Yes Historical Provider, MD  clonazePAM (KLONOPIN) 0.5 MG tablet Take 0.5 mg by mouth at bedtime. *May take one tablet three times daily as needed for anxiety* 12/14/14  Yes Historical Provider, MD  Cyanocobalamin (VITAMIN B-12 PO) Take 1 tablet by mouth daily.   Yes Historical Provider, MD  DULoxetine (CYMBALTA) 60 MG capsule Take 60 mg by mouth every morning.    Yes Historical Provider, MD  Fenofibric Acid 105 MG TABS Take 105 mg by mouth at bedtime.  12/28/14  Yes Historical Provider, MD  gabapentin (NEURONTIN) 600 MG tablet Take 600 mg by mouth 3 (three) times daily.   Yes Historical Provider, MD  levothyroxine (SYNTHROID, LEVOTHROID) 112 MCG tablet Take 112 mcg by mouth every morning.    Yes Historical Provider, MD  Multiple Vitamins-Minerals (MULTIVITAMIN ADULT PO) Take 1 tablet by mouth daily.   Yes  Historical Provider, MD  Omega-3 Fatty Acids (FISH OIL PO) Take 1 capsule by mouth daily.   Yes Historical Provider, MD  Pancrelipase, Lip-Prot-Amyl, (CREON) 6000 units CPEP Take by mouth.   Yes Historical Provider, MD  tamsulosin (FLOMAX) 0.4 MG CAPS capsule Take 0.4 mg by mouth 2 (two) times daily.  12/28/14  Yes Historical Provider, MD  tiZANidine (ZANAFLEX) 4 MG tablet Take 4 mg by mouth 3 (three) times daily.    Yes Historical Provider, MD  ondansetron (ZOFRAN ODT) 4 MG disintegrating tablet Take 1 tablet (4 mg total)  by mouth every 8 (eight) hours as needed for nausea or vomiting. 02/01/16   Shon Baton, MD  oxyCODONE-acetaminophen (PERCOCET/ROXICET) 5-325 MG tablet Take 1-2 tablets by mouth every 6 (six) hours as needed for severe pain. 02/01/16   Shon Baton, MD    Family History Family History  Problem Relation Age of Onset  . Diabetes Mother   . Diabetes Brother   . Diabetes Brother   . Cancer Father     ? stomach  . Pancreatitis Neg Hx     Social History Social History  Substance Use Topics  . Smoking status: Former Smoker    Packs/day: 0.40    Types: Cigarettes  . Smokeless tobacco: Never Used  . Alcohol use No     Comment: previously used alcohol and quit 6 years ago     Allergies   Amoxicillin; Dilaudid [hydromorphone hcl]; and Hydromorphone   Review of Systems Review of Systems  Constitutional: Negative for fever.  Respiratory: Negative for shortness of breath.   Cardiovascular: Negative for chest pain.  Gastrointestinal: Positive for abdominal pain, nausea and vomiting. Negative for constipation and diarrhea.  Genitourinary: Negative for dysuria.  All other systems reviewed and are negative.    Physical Exam Updated Vital Signs BP 111/84 (BP Location: Left Arm)   Pulse 77   Temp 97.9 F (36.6 C) (Oral)   Resp 18   Ht 5\' 7"  (1.702 m)   Wt 158 lb (71.7 kg)   SpO2 98%   BMI 24.75 kg/m   Physical Exam  Constitutional: He is oriented to person, place, and time. He appears well-developed and well-nourished. No distress.  HENT:  Head: Normocephalic and atraumatic.  Cardiovascular: Normal rate, regular rhythm and normal heart sounds.   No murmur heard. Pulmonary/Chest: Effort normal and breath sounds normal. No respiratory distress. He has no wheezes.  Abdominal: Soft. Bowel sounds are normal. He exhibits no distension. There is tenderness. There is no rebound.  Tenderness to palpation epigastrium without rebound or guarding, midline vertical surgical  scar well-healed  Musculoskeletal: He exhibits no edema.  Neurological: He is alert and oriented to person, place, and time.  Skin: Skin is warm and dry.  Psychiatric: He has a normal mood and affect.  Nursing note and vitals reviewed.    ED Treatments / Results  Labs (all labs ordered are listed, but only abnormal results are displayed) Labs Reviewed  CBC WITH DIFFERENTIAL/PLATELET - Abnormal; Notable for the following:       Result Value   RBC 4.14 (*)    Hemoglobin 12.1 (*)    HCT 36.4 (*)    All other components within normal limits  COMPREHENSIVE METABOLIC PANEL - Abnormal; Notable for the following:    Potassium 3.4 (*)    All other components within normal limits  LIPASE, BLOOD  URINALYSIS, ROUTINE W REFLEX MICROSCOPIC (NOT AT [facility])    EKG  EKG Interpretation None  Radiology Ct Abdomen Pelvis W Contrast  Result Date: 02/01/2016 CLINICAL DATA:  Abdominal pain all over with nausea and vomiting for 3 days. EXAM: CT ABDOMEN AND PELVIS WITH CONTRAST TECHNIQUE: Multidetector CT imaging of the abdomen and pelvis was performed using the standard protocol following bolus administration of intravenous contrast. CONTRAST:  ISOVUE-300 IOPAMIDOL (ISOVUE-300) INJECTION 61% COMPARISON:  12/12/2015 FINDINGS: Lower chest: Mild dependent changes in the lung bases. Hepatobiliary: Surgical absence of the gallbladder. Mild intra and extrahepatic bile duct dilatation is likely normal for postoperative physiology. No intraductal calcified stones are visualized. No focal liver lesions. Pancreas: There is mild infiltration in the subcutaneous fat around the pancreas suggesting mild acute pancreatitis. Normal enhancement of pancreatic parenchyma without evidence of necrosis. Circumscribed cyst adjacent to the body of the pancreas measuring 2 cm maximal diameter. This has been present since previous study and is consistent with a small pseudo cyst. Mild pancreatic ductal dilatation.  Prominent lymph nodes in the celiac axis region are probably reactive. Spleen: Normal in size without focal abnormality. Adrenals/Urinary Tract: No adrenal gland nodules. Multiple bilateral intrarenal stones. No hydronephrosis or hydroureter. No ureteral stones are demonstrated. Bladder wall is thickened possibly indicating cystitis. Stomach/Bowel: Stomach and small bowel are unremarkable. Diffusely stool-filled colon with sigmoid colonic diverticulosis. No evidence of diverticulitis. Appendix is normal. Vascular/Lymphatic: No significant vascular findings are present. No enlarged abdominal or pelvic lymph nodes. Reproductive: Prostate is unremarkable. Other: Several midline anterior abdominal wall hernias. Some contain fat. Some contains small bowel loops. No proximal bowel obstruction. No free air or free fluid in the abdomen. Musculoskeletal: Degenerative changes in the spine. No destructive bone lesions. IMPRESSION: Mild inflammatory infiltration around the pancreas with pancreatic ductal dilatation consistent with acute pancreatitis. Focal collection adjacent to the body of the pancreas is similar to previous study, likely representing a pseudo cyst. Follow-up imaging after resolution of acute process is recommended to exclude an underlying pancreatic mass lesion. Surgical absence of the gallbladder with intra and extrahepatic bile duct dilatation, likely physiologic. Bladder wall thickening may indicate cystitis. Multiple bilateral nonobstructing renal stones. Multiple ventral abdominal wall hernias containing fat and small bowel. No small bowel obstruction. Electronically Signed   By: Burman Nieves M.D.   On: 02/01/2016 04:19   Dg Abdomen Acute W/chest  Result Date: 02/01/2016 CLINICAL DATA:  Right-sided abdominal pain with nausea and vomiting since Saturday. Former smoker. History of hypertension and pancreatitis. EXAM: DG ABDOMEN ACUTE W/ 1V CHEST COMPARISON:  CT abdomen and pelvis 12/12/2015.   Chest 01/27/2013 FINDINGS: Normal heart size and pulmonary vascularity. No focal airspace disease or consolidation in the lungs. No blunting of costophrenic angles. No pneumothorax. Mediastinal contours appear intact. Surgical clips in the right upper quadrant. Gas and stool throughout the colon. No small or large bowel distention. No free intra-abdominal air. No abnormal air-fluid levels. Multiple calcifications projected over both kidneys likely representing renal stones. Multiple stones were demonstrated on prior CT scan. Calcified phleboliths in the pelvis. Degenerative changes in the spine. IMPRESSION: No evidence of active pulmonary disease. Multiple bilateral renal stones. Normal nonobstructive bowel gas pattern. Electronically Signed   By: Burman Nieves M.D.   On: 02/01/2016 02:48    Procedures Procedures (including critical care time)  Medications Ordered in ED Medications  morphine 4 MG/ML injection 4 mg (4 mg Intravenous Given 02/01/16 0208)  ondansetron (ZOFRAN) injection 4 mg (4 mg Intravenous Given 02/01/16 0208)  sodium chloride 0.9 % bolus 1,000 mL (0 mLs Intravenous Stopped 02/01/16 0259)  morphine  4 MG/ML injection 4 mg (4 mg Intravenous Given 02/01/16 0341)  iopamidol (ISOVUE-300) 61 % injection 100 mL (100 mLs Intravenous Contrast Given 02/01/16 0358)     Initial Impression / Assessment and Plan / ED Course  I have reviewed the triage vital signs and the nursing notes.  Pertinent labs & imaging results that were available during my care of the patient were reviewed by me and considered in my medical decision making (see chart for details).  Clinical Course as of Jan 31 438  Tue Feb 01, 2016  0319 On recheck, patient reports only minimal improvement of pain. Lab work is reassuring including lipase. He remains tender on exam. Discussed with patient further pain control and hydration versus CT scan. Patient flex her CT scan. I have reviewed the chart, he has had several  CTs since 2014.  [CH]    Clinical Course User Index [CH] Shon Baton, MD    Patient presents with pain consistent with prior episodes of pancreatitis. Nontoxic. Tender on exam without signs of peritonitis. Initial lab work is reassuring. On recheck, patient reports persistent pain. He was read as pain medication. Options were discussed with the patient. CT scan was obtained and shows inflammation of the pancreas. No change in pseudocyst. Discussed this with the patient and his wife. I have offered admission for pain control versus pain and nausea control as an outpatient. Patient states that he feels better and feels he can go home.  After history, exam, and medical workup I feel the patient has been appropriately medically screened and is safe for discharge home. Pertinent diagnoses were discussed with the patient. Patient was given return precautions.   Final Clinical Impressions(s) / ED Diagnoses   Final diagnoses:  Alcohol-induced chronic pancreatitis (HCC)    New Prescriptions New Prescriptions   ONDANSETRON (ZOFRAN ODT) 4 MG DISINTEGRATING TABLET    Take 1 tablet (4 mg total) by mouth every 8 (eight) hours as needed for nausea or vomiting.   OXYCODONE-ACETAMINOPHEN (PERCOCET/ROXICET) 5-325 MG TABLET    Take 1-2 tablets by mouth every 6 (six) hours as needed for severe pain.     Shon Baton, MD 02/01/16 [phone]

## 2016-02-01 NOTE — ED Notes (Signed)
Family at bedside. 

## 2016-02-01 NOTE — ED Notes (Signed)
Pt unable to void at this time, says he will inform us when able.

## 2016-06-12 ENCOUNTER — Ambulatory Visit (INDEPENDENT_AMBULATORY_CARE_PROVIDER_SITE_OTHER): Payer: Medicare Other | Admitting: Cardiology

## 2016-06-12 ENCOUNTER — Encounter: Payer: Self-pay | Admitting: *Deleted

## 2016-06-12 ENCOUNTER — Encounter: Payer: Self-pay | Admitting: Cardiology

## 2016-06-12 VITALS — BP 97/62 | HR 68 | Ht 67.0 in | Wt 161.6 lb

## 2016-06-12 DIAGNOSIS — R079 Chest pain, unspecified: Secondary | ICD-10-CM

## 2016-06-12 NOTE — Patient Instructions (Signed)
Medication Instructions:  Continue all current medications.  Labwork: none  Testing/Procedures: Your physician has requested that you have a lexiscan myoview. For further information please visit [email or web address]. Please follow instruction sheet, as given.  Office will contact with results via phone or letter.    Follow-Up: Pending test results   Any Other Special Instructions Will Be Listed Below (If Applicable).  If you need a refill on your cardiac medications before your next appointment, please call your pharmacy.  

## 2016-06-12 NOTE — Progress Notes (Signed)
Clinical Summary Justin [name] is a 61 y.o.male seen as new patient, he is referred by Dr Sherril Croon for the following medical problems.   1. Chest pain - symptoms started about 2-3 weeks ago. Dull pain, left sided, 8/10 in severity. Can occur at rest or with exertion. No other symptoms. Not positional. No relation to food. Lasts few minutes, up to 1 hour. Was daily, decrease in frequency - no significant DOE - no LE edema/orthopnea/pnd CAD risk factors: previously on HTN meds, HL, +tobacco, father MI 6, mother "heart troubles" roughly 53s     Past Medical History:  Diagnosis Date  . Alcoholism (HCC)   . Asthma   . Back pain   . Fibromyalgia   . GERD (gastroesophageal reflux disease)   . High cholesterol   . Hypertension   . Hypothyroidism   . Myocardial infarct    cardiologist in town  . Pancreatitis    with pseudocysts  . Pneumonia   . Stroke [facility])    right leg weakness     Allergies  Allergen Reactions  . Amoxicillin Hives and Swelling  . Dilaudid [Hydromorphone Hcl] Swelling  . Hydromorphone Swelling     Current Outpatient Prescriptions  Medication Sig Dispense Refill  . albuterol (PROAIR HFA) 108 (90 Base) MCG/ACT inhaler Inhale 1-2 puffs into the lungs every 6 (six) hours as needed for wheezing or shortness of breath.    . clonazePAM (KLONOPIN) 0.5 MG tablet Take 0.5 mg by mouth at bedtime. *May take one tablet three times daily as needed for anxiety*    . DULoxetine (CYMBALTA) 60 MG capsule Take 60 mg by mouth every morning.     . Fenofibric Acid 105 MG TABS Take 105 mg by mouth at bedtime.     . gabapentin (NEURONTIN) 600 MG tablet Take 600 mg by mouth 3 (three) times daily.    Marland Kitchen levothyroxine (SYNTHROID, LEVOTHROID) 112 MCG tablet Take 112 mcg by mouth every morning.     . Multiple Vitamins-Minerals (MULTIVITAMIN ADULT PO) Take 1 tablet by mouth daily.    Marland Kitchen oxyCODONE-acetaminophen (PERCOCET/ROXICET) 5-325 MG tablet Take 1-2 tablets by mouth every 6 (six)  hours as needed for severe pain. 15 tablet 0  . Pancrelipase, Lip-Prot-Amyl, (CREON) 6000 units CPEP Take by mouth.    . tamsulosin (FLOMAX) 0.4 MG CAPS capsule Take 0.4 mg by mouth 2 (two) times daily.     Marland Kitchen tiZANidine (ZANAFLEX) 4 MG tablet Take 4 mg by mouth 3 (three) times daily.      No current facility-administered medications for this visit.      Past Surgical History:  Procedure Laterality Date  . BACK SURGERY    . CHOLECYSTECTOMY  2009  . FINGER SURGERY    . PANCREATIC PSEUDOCYST DRAINAGE  2014     Allergies  Allergen Reactions  . Amoxicillin Hives and Swelling  . Dilaudid [Hydromorphone Hcl] Swelling  . Hydromorphone Swelling      Family History  Problem Relation Age of Onset  . Diabetes Mother   . Heart attack Mother   . Diabetes Brother   . Heart attack Brother   . Brain cancer Brother   . Diabetes Brother   . Cancer Father     ? stomach  . Heart attack Father   . Pancreatitis Neg Hx      Social History Justin [name] reports that he has quit smoking. His smoking use included Cigarettes. He smoked 0.40 packs per day. He has never used smokeless  tobacco. Justin [name] reports that he does not drink alcohol.   Review of Systems CONSTITUTIONAL: No weight loss, fever, chills, weakness or fatigue.  HEENT: Eyes: No visual loss, blurred vision, double vision or yellow sclerae.No hearing loss, sneezing, congestion, runny nose or sore throat.  SKIN: No rash or itching.  CARDIOVASCULAR: per hpi RESPIRATORY: No shortness of breath, cough or sputum.  GASTROINTESTINAL: No anorexia, nausea, vomiting or diarrhea. No abdominal pain or blood.  GENITOURINARY: No burning on urination, no polyuria NEUROLOGICAL: No headache, dizziness, syncope, paralysis, ataxia, numbness or tingling in the extremities. No change in bowel or bladder control.  MUSCULOSKELETAL: No muscle, back pain, joint pain or stiffness.  LYMPHATICS: No enlarged nodes. No history of splenectomy.    PSYCHIATRIC: No history of depression or anxiety.  ENDOCRINOLOGIC: No reports of sweating, cold or heat intolerance. No polyuria or polydipsia.  Marland Kitchen   Physical Examination Vitals:   06/12/16 1438 06/12/16 1443  BP: 105/66 97/62  Pulse: 61 68   Vitals:   06/12/16 1438  Weight: 161 lb 9.6 oz (73.3 kg)  Height: 5\' 7"  (1.702 m)    Gen: resting comfortably, no acute distress HEENT: no scleral icterus, pupils equal round and reactive, no palptable cervical adenopathy,  CV: RRR, no m/r/g, no jvd Resp: Clear to auscultation bilaterally GI: abdomen is soft, non-tender, non-distended, normal bowel sounds, no hepatosplenomegaly MSK: extremities are warm, no edema.  Skin: warm, no rash Neuro:  no focal deficits Psych: appropriate affect     Assessment and Plan  1. Chest pain - multiple CAD risk factors. He is not able to run on treamdill due to unstable gain, we will plan for lexiscan.      F/u pending stress test , M.D.

## 2016-06-21 ENCOUNTER — Inpatient Hospital Stay (HOSPITAL_COMMUNITY): Admission: RE | Admit: 2016-06-21 | Payer: Medicare Other | Source: Ambulatory Visit

## 2016-06-21 ENCOUNTER — Encounter (HOSPITAL_COMMUNITY)
Admission: RE | Admit: 2016-06-21 | Discharge: 2016-06-21 | Disposition: A | Discharge status: Home / Self Care | Payer: Medicare Other | Source: Ambulatory Visit | Attending: Cardiology | Admitting: Cardiology

## 2016-06-21 ENCOUNTER — Encounter (HOSPITAL_COMMUNITY): Payer: Self-pay

## 2016-06-21 DIAGNOSIS — R079 Chest pain, unspecified: Secondary | ICD-10-CM | POA: Insufficient documentation

## 2016-06-21 LAB — NM MYOCAR MULTI W/SPECT W/WALL MOTION / EF
CHL CUP NUCLEAR SDS: 4
CHL CUP NUCLEAR SRS: 0
CHL CUP NUCLEAR SSS: 4
CHL CUP RESTING HR STRESS: 43 {beats}/min
LHR: 0.32
LV dias vol: 99 mL (ref 62–150)
LV sys vol: 56 mL
Peak HR: 92 {beats}/min
TID: 1.23

## 2016-06-21 MED ORDER — TECHNETIUM TC 99M TETROFOSMIN IV KIT
10.0000 | PACK | Freq: Once | INTRAVENOUS | Status: AC | PRN
  Administered 2016-06-21: 10 via INTRAVENOUS

## 2016-06-21 MED ORDER — TECHNETIUM TC 99M TETROFOSMIN IV KIT
30.0000 | PACK | Freq: Once | INTRAVENOUS | Status: AC | PRN
  Administered 2016-06-21: 30 via INTRAVENOUS

## 2016-06-21 MED ORDER — SODIUM CHLORIDE 0.9% FLUSH
INTRAVENOUS | Status: AC
  Administered 2016-06-21: 10 mL via INTRAVENOUS
  Filled 2016-06-21: qty 10

## 2016-06-21 MED ORDER — REGADENOSON 0.4 MG/5ML IV SOLN
INTRAVENOUS | Status: AC
  Administered 2016-06-21: 0.4 mg via INTRAVENOUS
  Filled 2016-06-21: qty 5

## 2016-06-27 ENCOUNTER — Telehealth: Payer: Self-pay | Admitting: *Deleted

## 2016-06-27 DIAGNOSIS — I25709 Atherosclerosis of coronary artery bypass graft(s), unspecified, with unspecified angina pectoris: Secondary | ICD-10-CM

## 2016-06-27 NOTE — Telephone Encounter (Signed)
-----   Message from Antoine Poche, MD sent at 06/22/2016 10:06 AM EDT ----- Stress test does not show any blockages, but suggests the pumping function of his heart may be decreased. We need to get an echo for chest pain to better evaluate his heart's pumping function   J BrancH MD

## 2016-06-27 NOTE — Telephone Encounter (Signed)
Pt agreeable to stress test - order placed and will forward to schedulers - routed to pcp

## 2016-06-29 ENCOUNTER — Telehealth: Payer: Self-pay | Admitting: Cardiology

## 2016-06-29 ENCOUNTER — Telehealth: Payer: Self-pay | Admitting: *Deleted

## 2016-06-29 NOTE — Telephone Encounter (Signed)
Pre-cert Verification for the following procedure   Echo scheduled for 07/13/16

## 2016-06-29 NOTE — Telephone Encounter (Signed)
Pt aware of echo appt

## 2016-06-29 NOTE — Telephone Encounter (Signed)
-----   Message from Megan Salon sent at 06/29/2016  8:49 AM EDT ----- Scheduled for 07/13/16 @ 130pm   ----- Message ----- From: Albertine Patricia, CMA Sent: 06/27/2016   3:53 PM To: Megan Salon  Can you please schedule this pt for echo - I can call with date and time. Thank you  Justin [name]

## 2016-07-13 ENCOUNTER — Ambulatory Visit (INDEPENDENT_AMBULATORY_CARE_PROVIDER_SITE_OTHER): Payer: Medicare Other

## 2016-07-13 ENCOUNTER — Other Ambulatory Visit: Payer: Self-pay

## 2016-07-13 DIAGNOSIS — I25709 Atherosclerosis of coronary artery bypass graft(s), unspecified, with unspecified angina pectoris: Secondary | ICD-10-CM | POA: Diagnosis not present

## 2016-07-17 ENCOUNTER — Telehealth: Payer: Self-pay | Admitting: *Deleted

## 2016-07-17 NOTE — Telephone Encounter (Signed)
Pt wife (DPR) made aware - pt will f/u with pcp - declined f/u with [place] at this time. Routed to pcp

## 2016-07-17 NOTE — Telephone Encounter (Signed)
-----   Message from Antoine Poche, MD sent at 07/17/2016 12:35 PM EDT ----- Echo looks good, overall heart testing has been normal. No further testing at this time, he needs to f/u with pcp to discuss noncardiac causes of pain. F/u with [place] 3 months  Dominga Ferry MD

## 2017-04-13 ENCOUNTER — Emergency Department (HOSPITAL_COMMUNITY)
Admission: EM | Admit: 2017-04-13 | Discharge: 2017-04-13 | Disposition: A | Discharge status: Home / Self Care | Payer: Medicare Other | Attending: Emergency Medicine | Admitting: Emergency Medicine

## 2017-04-13 ENCOUNTER — Encounter (HOSPITAL_COMMUNITY): Payer: Self-pay

## 2017-04-13 ENCOUNTER — Emergency Department (HOSPITAL_COMMUNITY): Payer: Medicare Other

## 2017-04-13 DIAGNOSIS — Z8673 Personal history of transient ischemic attack (TIA), and cerebral infarction without residual deficits: Secondary | ICD-10-CM | POA: Diagnosis not present

## 2017-04-13 DIAGNOSIS — I2510 Atherosclerotic heart disease of native coronary artery without angina pectoris: Secondary | ICD-10-CM | POA: Diagnosis not present

## 2017-04-13 DIAGNOSIS — Z79899 Other long term (current) drug therapy: Secondary | ICD-10-CM | POA: Diagnosis not present

## 2017-04-13 DIAGNOSIS — Z87891 Personal history of nicotine dependence: Secondary | ICD-10-CM | POA: Diagnosis not present

## 2017-04-13 DIAGNOSIS — I10 Essential (primary) hypertension: Secondary | ICD-10-CM | POA: Insufficient documentation

## 2017-04-13 DIAGNOSIS — E7800 Pure hypercholesterolemia, unspecified: Secondary | ICD-10-CM | POA: Diagnosis not present

## 2017-04-13 DIAGNOSIS — E039 Hypothyroidism, unspecified: Secondary | ICD-10-CM | POA: Insufficient documentation

## 2017-04-13 DIAGNOSIS — I252 Old myocardial infarction: Secondary | ICD-10-CM | POA: Insufficient documentation

## 2017-04-13 DIAGNOSIS — J45909 Unspecified asthma, uncomplicated: Secondary | ICD-10-CM | POA: Diagnosis not present

## 2017-04-13 DIAGNOSIS — R109 Unspecified abdominal pain: Secondary | ICD-10-CM | POA: Diagnosis present

## 2017-04-13 DIAGNOSIS — Z7982 Long term (current) use of aspirin: Secondary | ICD-10-CM | POA: Diagnosis not present

## 2017-04-13 DIAGNOSIS — K859 Acute pancreatitis without necrosis or infection, unspecified: Secondary | ICD-10-CM | POA: Insufficient documentation

## 2017-04-13 LAB — COMPREHENSIVE METABOLIC PANEL
ALT: 16 U/L — ABNORMAL LOW (ref 17–63)
ANION GAP: 9 (ref 5–15)
AST: 12 U/L — ABNORMAL LOW (ref 15–41)
Albumin: 4.1 g/dL (ref 3.5–5.0)
Alkaline Phosphatase: 57 U/L (ref 38–126)
BUN: 19 mg/dL (ref 6–20)
CHLORIDE: 108 mmol/L (ref 101–111)
CO2: 22 mmol/L (ref 22–32)
CREATININE: 0.91 mg/dL (ref 0.61–1.24)
Calcium: 9.1 mg/dL (ref 8.9–10.3)
Glucose, Bld: 127 mg/dL — ABNORMAL HIGH (ref 65–99)
POTASSIUM: 3.5 mmol/L (ref 3.5–5.1)
SODIUM: 139 mmol/L (ref 135–145)
Total Bilirubin: 0.4 mg/dL (ref 0.3–1.2)
Total Protein: 7.3 g/dL (ref 6.5–8.1)

## 2017-04-13 LAB — LIPASE, BLOOD: Lipase: 70 U/L — ABNORMAL HIGH (ref 11–51)

## 2017-04-13 LAB — URINALYSIS, ROUTINE W REFLEX MICROSCOPIC
Bilirubin Urine: NEGATIVE
Glucose, UA: NEGATIVE mg/dL
Hgb urine dipstick: NEGATIVE
Ketones, ur: NEGATIVE mg/dL
LEUKOCYTES UA: NEGATIVE
Nitrite: NEGATIVE
PROTEIN: NEGATIVE mg/dL
SPECIFIC GRAVITY, URINE: 1.02 (ref 1.005–1.030)
pH: 5 (ref 5.0–8.0)

## 2017-04-13 LAB — CBC
HEMATOCRIT: 36.8 % — AB (ref 39.0–52.0)
HEMOGLOBIN: 12 g/dL — AB (ref 13.0–17.0)
MCH: 28.4 pg (ref 26.0–34.0)
MCHC: 32.6 g/dL (ref 30.0–36.0)
MCV: 87 fL (ref 78.0–100.0)
PLATELETS: 202 10*3/uL (ref 150–400)
RBC: 4.23 MIL/uL (ref 4.22–5.81)
RDW: 14.8 % (ref 11.5–15.5)
WBC: 5 10*3/uL (ref 4.0–10.5)

## 2017-04-13 LAB — ETHANOL: Alcohol, Ethyl (B): <10 mg/dL (ref <10)

## 2017-04-13 MED ORDER — MORPHINE SULFATE (PF) 4 MG/ML IV SOLN
4.0000 mg | Freq: Once | INTRAVENOUS | Status: AC
  Administered 2017-04-13: 4 mg via INTRAVENOUS
  Filled 2017-04-13: qty 1

## 2017-04-13 MED ORDER — IOPAMIDOL (ISOVUE-300) INJECTION 61%
100.0000 mL | Freq: Once | INTRAVENOUS | Status: AC | PRN
  Administered 2017-04-13: 100 mL via INTRAVENOUS

## 2017-04-13 MED ORDER — SODIUM CHLORIDE 0.9 % IV BOLUS (SEPSIS)
1000.0000 mL | Freq: Once | INTRAVENOUS | Status: AC
  Administered 2017-04-13: 1000 mL via INTRAVENOUS

## 2017-04-13 MED ORDER — ONDANSETRON HCL 4 MG/2ML IJ SOLN
4.0000 mg | Freq: Once | INTRAMUSCULAR | Status: AC
  Administered 2017-04-13: 4 mg via INTRAVENOUS
  Filled 2017-04-13: qty 2

## 2017-04-13 MED ORDER — OXYCODONE HCL 5 MG PO TABS
10.0000 mg | ORAL_TABLET | ORAL | Status: AC
  Administered 2017-04-13: 10 mg via ORAL
  Filled 2017-04-13: qty 2

## 2017-04-13 MED ORDER — ONDANSETRON 4 MG PO TBDP: 4.0000 mg | ORAL_TABLET | Freq: Three times a day (TID) | ORAL | 0 refills | Status: DC | PRN

## 2017-04-13 NOTE — ED Triage Notes (Signed)
Pt c/o mid abd pain since last night.  Reports nausea and diarrhea, no vomiting.  Reports has history of an abd stent and says when he eats high fat foods he has a lot of pain.

## 2017-04-13 NOTE — ED Notes (Signed)
Pt taken to ct 

## 2017-04-13 NOTE — ED Notes (Signed)
Pt returned from ct. States morphine didn't help. edp aware

## 2017-04-13 NOTE — Discharge Instructions (Signed)
Your testing shows that you likely have some early pancreatitis Your CT scan was reassuring with no signs of cysts Blood work was otherwise reassuring as well These continue to take your pain medications exactly as prescribed by your doctor, Zofran as needed for nausea, emergency department for severe or worsening symptoms Nothing but clear liquids for the next 48 hours, please try to drink juice, Gatorade or other electrolyte-containing solutions Then progress her diet slowly to soft foods

## 2017-04-13 NOTE — ED Provider Notes (Signed)
[facility] EMERGENCY DEPARTMENT Provider Note   CSN: [id number] Arrival date & time: 04/13/17  6222     History   Chief Complaint Chief Complaint  Patient presents with  . Abdominal Pain    HPI Justin [name] is a 62 y.o. male.  HPI  62 year old male with a known history of alcoholism in the past, history of cholecystectomy, also has a history of pancreatitis which has been recurrent over time.  He had been in his usual state of health doing better until about 1 week ago when he had a large meal of pizza, since that time he had had some mild discomfort however last night this pain became much worse and was associated with multiple episodes of nausea and dry heaving.  He is having some diarrhea and passing gas, he feels febrile but has not taken his temperature.  His symptoms are persistent, gradually worsening and have become severe this morning prompting his emergency department visit.  The patient does report that he has had multiple interventions for his pancreatitis in the past including surgery, stenting of ducts and drainage of pancreatic pseudocyst.  Past Medical History:  Diagnosis Date  . Alcoholism (HCC)   . Asthma   . Back pain   . Fibromyalgia   . GERD (gastroesophageal reflux disease)   . High cholesterol   . Hypertension   . Hypothyroidism   . Myocardial infarct [facility])    cardiologist in town  . Pancreatitis    with pseudocysts  . Pneumonia   . Stroke [facility])    right leg weakness    Patient Active Problem List   Diagnosis Date Noted  . Other pancytopenia (HCC) 02/06/2013  . Pseudocyst, pancreas 02/03/2013  . Acute pancreatitis 06/28/2012  . Pancreatic abscess 06/28/2012  . Chest pain epsode 4/17 - relieved with NTG 06/28/2012  . SIRS (systemic inflammatory response syndrome) (HCC) 06/27/2012  . Acute respiratory failure (HCC) 06/26/2012  . Unspecified protein-calorie malnutrition (HCC) 06/26/2012  . Pancreatic pseudocyst from acute pancreatitis Dx  06/10/2012 06/11/2012  . 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 Elevated triglycerides with high cholesterol 06/10/2012  . Unspecified hypothyroidism 06/07/2012  . Depressive disorder, not elsewhere classified 06/07/2012  . CAD (coronary artery disease) of artery bypass graft 05/30/2012  . Hypokalemia 05/30/2012  . Abnormal LFTs (liver function tests) 05/30/2012  . Back pain   . Stroke (HCC)   . Fibromyalgia   . Myocardial infarct [facility])     Past Surgical History:  Procedure Laterality Date  . BACK SURGERY    . CHOLECYSTECTOMY  2009  . FINGER SURGERY    . PANCREATIC PSEUDOCYST DRAINAGE  2014       Home Medications    Prior to Admission medications   Medication Sig Start Date End Date Taking? Authorizing Provider  albuterol (PROAIR HFA) 108 (90 Base) MCG/ACT inhaler Inhale 1-2 puffs into the lungs every 6 (six) hours as needed for wheezing or shortness of breath.   Yes [provider]  aspirin 81 MG chewable tablet Chew by mouth.   Yes [provider]  clonazePAM (KLONOPIN) 0.5 MG tablet Take 0.5 mg by mouth at bedtime. *May take one tablet three times daily as needed for anxiety* 12/14/14  Yes [provider]  DULoxetine (CYMBALTA) 60 MG capsule Take 60 mg by mouth every morning.    Yes [provider]  Fenofibric Acid 105 MG TABS Take 105 mg by mouth at bedtime.  12/28/14  Yes [provider]  gabapentin (NEURONTIN) 600 MG tablet Take 600 mg by mouth 3 (three) times daily.   Yes [provider]  levothyroxine (SYNTHROID, LEVOTHROID) 112 MCG tablet Take 112 mcg by mouth every morning.    Yes [provider]  Multiple Vitamins-Minerals (MULTIVITAMIN ADULT PO) Take 1 tablet by mouth daily.   Yes [provider]  oxyCODONE-acetaminophen (PERCOCET/ROXICET) 5-325 MG tablet Take 1-2 tablets by mouth every 6 (six) hours as needed for severe pain. 02/01/16  Yes Horton,  Mayer Masker, MD  Pancrelipase, Lip-Prot-Amyl, (CREON) 6000 units CPEP Take by mouth.   Yes [provider]  polyethylene glycol (MIRALAX / GLYCOLAX) packet Take by mouth. 07/19/12  Yes [provider]  predniSONE (DELTASONE) 10 MG tablet Take 2 tablets by mouth daily. 04/11/17  Yes [provider]  tamsulosin (FLOMAX) 0.4 MG CAPS capsule Take 0.4 mg by mouth 2 (two) times daily.  12/28/14  Yes [provider]  tiZANidine (ZANAFLEX) 4 MG tablet Take 4 mg by mouth 3 (three) times daily.    Yes [provider]  ondansetron (ZOFRAN ODT) 4 MG disintegrating tablet Take 1 tablet (4 mg total) by mouth every 8 (eight) hours as needed for nausea. 04/13/17   Eber Hong, MD    Family History Family History  Problem Relation Age of Onset  . Diabetes Mother   . Heart attack Mother   . Diabetes Brother   . Heart attack Brother   . Brain cancer Brother   . Diabetes Brother   . Cancer Father        ? stomach  . Heart attack Father   . Pancreatitis Neg Hx     Social History Social History   Tobacco Use  . Smoking status: Former Smoker    Packs/day: 0.40    Types: Cigarettes  . Smokeless tobacco: Never Used  Substance Use Topics  . Alcohol use: No    Alcohol/week: 0.0 oz    Comment: previously used alcohol and quit 6 years ago  . Drug use: Yes    Types: Marijuana    Comment: daily     Allergies   Amoxicillin; Dilaudid [hydromorphone hcl]; and Hydromorphone   Review of Systems Review of Systems  All other systems reviewed and are negative.    Physical Exam Updated Vital Signs BP 118/69 (BP Location: Right Arm)   Pulse 60   Temp 98.3 F (36.8 C) (Oral)   Resp 18   Ht 5\' 7"  (1.702 m)   Wt 70.8 kg (156 lb)   SpO2 98%   BMI 24.43 kg/m   Physical Exam  Constitutional: He appears well-developed and well-nourished. No distress.  HENT:  Head: Normocephalic and atraumatic.  Mouth/Throat: No oropharyngeal exudate.  Dry mucous  membranes  Eyes: Conjunctivae and EOM are normal. Pupils are equal, round, and reactive to light. Right eye exhibits no discharge. Left eye exhibits no discharge. No scleral icterus.  Neck: Normal range of motion. Neck supple. No JVD present. No thyromegaly present.  Cardiovascular: Normal rate, regular rhythm, normal heart sounds and intact distal pulses. Exam reveals no gallop and no friction rub.  No murmur heard. Rate of 60, normal pulses, no edema  Pulmonary/Chest: Effort normal and breath sounds normal. No respiratory distress. He has no wheezes. He has no rales.  Abdominal: Soft. Bowel sounds are normal. He exhibits no distension and no mass. There is tenderness ( Tenderness is focused to the epigastrium in the supraumbilical area, mild right upper quadrant,  no other tenderness).  Musculoskeletal: Normal range of motion. He exhibits no edema or tenderness.  Lymphadenopathy:    He has no cervical adenopathy.  Neurological: He is alert. Coordination normal.  Skin: Skin is warm and dry. No rash noted. No erythema.  Psychiatric: He has a normal mood and affect. His behavior is normal.  Nursing note and vitals reviewed.    ED Treatments / Results  Labs (all labs ordered are listed, but only abnormal results are displayed) Labs Reviewed  LIPASE, BLOOD - Abnormal; Notable for the following components:      Result Value   Lipase 70 (*)    All other components within normal limits  COMPREHENSIVE METABOLIC PANEL - Abnormal; Notable for the following components:   Glucose, Bld 127 (*)    AST 12 (*)    ALT 16 (*)    All other components within normal limits  CBC - Abnormal; Notable for the following components:   Hemoglobin 12.0 (*)    HCT 36.8 (*)    All other components within normal limits  URINALYSIS, ROUTINE W REFLEX MICROSCOPIC  ETHANOL     Radiology Ct Abdomen Pelvis W Contrast  Result Date: 04/13/2017 CLINICAL DATA:  mid abd pain since last night. Reports nausea and  diarrhea, no vomiting. Reports has history of an abd stent and says when he eats high fat foods he has a lot of pain.Pt co mid abd pain noted hx surgery of pancreatitis +n,-v,+d began last night EXAM: CT ABDOMEN AND PELVIS WITH CONTRAST TECHNIQUE: Multidetector CT imaging of the abdomen and pelvis was performed using the standard protocol following bolus administration of intravenous contrast. CONTRAST:  ISOVUE-300 IOPAMIDOL (ISOVUE-300) INJECTION 61% COMPARISON:  02/01/2016 FINDINGS: Lower chest: No acute abnormality. Hepatobiliary: Previous cholecystectomy. Mild scratch the moderate central intrahepatic biliary ductal dilatation and prominence of the CBD, seen down to the ampullary, stable since previous. No focal liver lesion. Pancreas: Focal parenchymal loss at the level the pancreatic neck. The small fluid collection seen previously has resolved. There is no ductal dilatation. 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 Spleen: Normal in size without focal abnormality. Adrenals/Urinary Tract: Normal adrenals. Bilateral nephrolithiasis, largest stone on the left 0.4 cm in the upper pole, on the right 0.3 cm. No hydronephrosis. Urinary bladder incompletely distended. Stomach/Bowel: Stomach is nondistended. Small gastric varices. Small bowel is nondilated. Normal appendix. The colon is nondilated with multiple descending and sigmoid diverticula; no significant adjacent inflammatory/edematous change. 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 No abdominal or pelvic adenopathy. Reproductive: Mild prostatic enlargement. Other: No ascites.  No free air. Musculoskeletal: Broad supraumbilical ventral hernia involving small bowel loops and mesenteric fat without evidence of obstruction or strangulation. Facet DJD in the  lower lumbar spine. Negative for fracture or worrisome bone lesion. IMPRESSION: 1. No acute findings. 2. Bilateral nephrolithiasis without hydronephrosis. 3. Resolution of peripancreatic pseudocyst seen on the prior study. 4. 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 5. Descending and sigmoid diverticulosis. Electronically Signed   By: Corlis Leak M.D.   On: 04/13/2017 10:31    Procedures Procedures (including critical care time)  Medications Ordered in ED Medications  ondansetron (ZOFRAN) injection 4 mg (4 mg Intravenous Given 04/13/17 0859)  sodium chloride 0.9 % bolus 1,000 mL (0 mLs Intravenous Stopped 04/13/17 0959)  morphine  4 MG/ML injection 4 mg (4 mg Intravenous Given 04/13/17 0859)  iopamidol (ISOVUE-300) 61 % injection 100 mL (100 mLs Intravenous Contrast Given 04/13/17 1005)  oxyCODONE (Oxy IR/ROXICODONE) immediate release tablet 10 mg (10 mg Oral Given 04/13/17 1119)     Initial Impression / Assessment and Plan / ED Course  I have reviewed the triage vital signs and the nursing notes.  Pertinent labs & imaging results that were available during my care of the patient were reviewed by me and considered in my medical decision making (see chart for details).  Clinical Course as of Apr 13 1221  Fri Apr 13, 2017  1004 Lipase: (!) 70 [BM]  1004 WBC: 5.0 [BM]  1004 Hemoglobin: (!) 12.0 [BM]  1004 Glucose: (!) 127 [BM]  1004 Potassium: 3.5 [BM]  1004 Labs reviewed, minimal elevation of lipase, alcohol undetectable Sodium: 139 [BM]    Clinical Course User Index [BM] Eber Hong, MD    I suspect the patient has some degree of recurrent pancreatitis, will check labs, fluids, the patient does have significant allergies so finding a pain medication may be somewhat difficult.  The patient reports successful use of morphine in the past though he does have an allergy to Dilaudid.  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  He denies any alcohol use in over 8 years  Final Clinical Impressions(s) / ED Diagnoses   Final diagnoses:  Acute pancreatitis without infection or necrosis, unspecified pancreatitis type    ED Discharge Orders        Ordered    ondansetron (ZOFRAN ODT) 4 MG disintegrating tablet  Every 8 hours PRN     04/13/17 1221       Eber Hong, MD 04/13/17 1223

## 2017-04-15 ENCOUNTER — Other Ambulatory Visit: Payer: Self-pay

## 2017-04-15 ENCOUNTER — Encounter (HOSPITAL_COMMUNITY): Payer: Self-pay | Admitting: Emergency Medicine

## 2017-04-15 ENCOUNTER — Inpatient Hospital Stay (HOSPITAL_COMMUNITY)
Admission: EM | Admit: 2017-04-15 | Discharge: 2017-04-19 | DRG: 440 | Disposition: A | Discharge status: Home / Self Care | Payer: Medicare Other | Attending: Internal Medicine | Admitting: Internal Medicine

## 2017-04-15 DIAGNOSIS — M797 Fibromyalgia: Secondary | ICD-10-CM | POA: Diagnosis present

## 2017-04-15 DIAGNOSIS — E039 Hypothyroidism, unspecified: Secondary | ICD-10-CM | POA: Diagnosis present

## 2017-04-15 DIAGNOSIS — J45909 Unspecified asthma, uncomplicated: Secondary | ICD-10-CM | POA: Diagnosis not present

## 2017-04-15 DIAGNOSIS — I252 Old myocardial infarction: Secondary | ICD-10-CM

## 2017-04-15 DIAGNOSIS — Z79899 Other long term (current) drug therapy: Secondary | ICD-10-CM

## 2017-04-15 DIAGNOSIS — E7800 Pure hypercholesterolemia, unspecified: Secondary | ICD-10-CM | POA: Diagnosis not present

## 2017-04-15 DIAGNOSIS — Z79890 Hormone replacement therapy (postmenopausal): Secondary | ICD-10-CM

## 2017-04-15 DIAGNOSIS — K219 Gastro-esophageal reflux disease without esophagitis: Secondary | ICD-10-CM | POA: Diagnosis present

## 2017-04-15 DIAGNOSIS — K859 Acute pancreatitis without necrosis or infection, unspecified: Secondary | ICD-10-CM | POA: Diagnosis not present

## 2017-04-15 DIAGNOSIS — Z8249 Family history of ischemic heart disease and other diseases of the circulatory system: Secondary | ICD-10-CM | POA: Diagnosis not present

## 2017-04-15 DIAGNOSIS — R001 Bradycardia, unspecified: Secondary | ICD-10-CM | POA: Diagnosis present

## 2017-04-15 DIAGNOSIS — Z87442 Personal history of urinary calculi: Secondary | ICD-10-CM

## 2017-04-15 DIAGNOSIS — Z808 Family history of malignant neoplasm of other organs or systems: Secondary | ICD-10-CM

## 2017-04-15 DIAGNOSIS — I2510 Atherosclerotic heart disease of native coronary artery without angina pectoris: Secondary | ICD-10-CM | POA: Diagnosis present

## 2017-04-15 DIAGNOSIS — E860 Dehydration: Secondary | ICD-10-CM | POA: Diagnosis not present

## 2017-04-15 DIAGNOSIS — F329 Major depressive disorder, single episode, unspecified: Secondary | ICD-10-CM | POA: Diagnosis not present

## 2017-04-15 DIAGNOSIS — I10 Essential (primary) hypertension: Secondary | ICD-10-CM | POA: Diagnosis present

## 2017-04-15 DIAGNOSIS — Z87891 Personal history of nicotine dependence: Secondary | ICD-10-CM | POA: Diagnosis not present

## 2017-04-15 DIAGNOSIS — Z8673 Personal history of transient ischemic attack (TIA), and cerebral infarction without residual deficits: Secondary | ICD-10-CM

## 2017-04-15 DIAGNOSIS — F1290 Cannabis use, unspecified, uncomplicated: Secondary | ICD-10-CM | POA: Diagnosis present

## 2017-04-15 DIAGNOSIS — Z7982 Long term (current) use of aspirin: Secondary | ICD-10-CM

## 2017-04-15 DIAGNOSIS — Z9049 Acquired absence of other specified parts of digestive tract: Secondary | ICD-10-CM | POA: Diagnosis not present

## 2017-04-15 DIAGNOSIS — I864 Gastric varices: Secondary | ICD-10-CM | POA: Diagnosis present

## 2017-04-15 DIAGNOSIS — Z7952 Long term (current) use of systemic steroids: Secondary | ICD-10-CM

## 2017-04-15 DIAGNOSIS — Z951 Presence of aortocoronary bypass graft: Secondary | ICD-10-CM | POA: Diagnosis not present

## 2017-04-15 DIAGNOSIS — K861 Other chronic pancreatitis: Secondary | ICD-10-CM | POA: Diagnosis present

## 2017-04-15 DIAGNOSIS — Z885 Allergy status to narcotic agent status: Secondary | ICD-10-CM | POA: Diagnosis not present

## 2017-04-15 DIAGNOSIS — I25812 Atherosclerosis of bypass graft of coronary artery of transplanted heart without angina pectoris: Secondary | ICD-10-CM | POA: Diagnosis not present

## 2017-04-15 DIAGNOSIS — Z79891 Long term (current) use of opiate analgesic: Secondary | ICD-10-CM

## 2017-04-15 DIAGNOSIS — Z881 Allergy status to other antibiotic agents status: Secondary | ICD-10-CM

## 2017-04-15 DIAGNOSIS — Z833 Family history of diabetes mellitus: Secondary | ICD-10-CM | POA: Diagnosis not present

## 2017-04-15 DIAGNOSIS — I25810 Atherosclerosis of coronary artery bypass graft(s) without angina pectoris: Secondary | ICD-10-CM | POA: Diagnosis present

## 2017-04-15 DIAGNOSIS — K5730 Diverticulosis of large intestine without perforation or abscess without bleeding: Secondary | ICD-10-CM | POA: Diagnosis present

## 2017-04-15 DIAGNOSIS — E876 Hypokalemia: Secondary | ICD-10-CM | POA: Diagnosis not present

## 2017-04-15 DIAGNOSIS — K863 Pseudocyst of pancreas: Secondary | ICD-10-CM | POA: Diagnosis present

## 2017-04-15 LAB — COMPREHENSIVE METABOLIC PANEL
ALT: 20 U/L (ref 17–63)
AST: 12 U/L — AB (ref 15–41)
Albumin: 4 g/dL (ref 3.5–5.0)
Alkaline Phosphatase: 60 U/L (ref 38–126)
Anion gap: 10 (ref 5–15)
BUN: 17 mg/dL (ref 6–20)
CHLORIDE: 108 mmol/L (ref 101–111)
CO2: 23 mmol/L (ref 22–32)
Calcium: 9.3 mg/dL (ref 8.9–10.3)
Creatinine, Ser: 0.9 mg/dL (ref 0.61–1.24)
GFR calc Af Amer: >60 mL/min (ref >60)
Glucose, Bld: 100 mg/dL — ABNORMAL HIGH (ref 65–99)
POTASSIUM: 3.5 mmol/L (ref 3.5–5.1)
Sodium: 141 mmol/L (ref 135–145)
Total Bilirubin: 0.4 mg/dL (ref 0.3–1.2)
Total Protein: 7.2 g/dL (ref 6.5–8.1)

## 2017-04-15 LAB — CBC
HCT: 37 % — ABNORMAL LOW (ref 39.0–52.0)
Hemoglobin: 12.2 g/dL — ABNORMAL LOW (ref 13.0–17.0)
MCH: 28.4 pg (ref 26.0–34.0)
MCHC: 33 g/dL (ref 30.0–36.0)
MCV: 86.2 fL (ref 78.0–100.0)
PLATELETS: 200 10*3/uL (ref 150–400)
RBC: 4.29 MIL/uL (ref 4.22–5.81)
RDW: 14.7 % (ref 11.5–15.5)
WBC: 4.9 10*3/uL (ref 4.0–10.5)

## 2017-04-15 LAB — LIPASE, BLOOD: Lipase: 60 U/L — ABNORMAL HIGH (ref 11–51)

## 2017-04-15 LAB — CBG MONITORING, ED: GLUCOSE-CAPILLARY: 103 mg/dL — AB (ref 65–99)

## 2017-04-15 MED ORDER — FENOFIBRATE 54 MG PO TABS
108.0000 mg | ORAL_TABLET | Freq: Every day | ORAL | Status: DC
  Administered 2017-04-15 – 2017-04-19 (×5): 108 mg via ORAL
  Filled 2017-04-15 (×8): qty 2

## 2017-04-15 MED ORDER — ACETAMINOPHEN 650 MG RE SUPP: 650.0000 mg | Freq: Four times a day (QID) | RECTAL | Status: DC | PRN

## 2017-04-15 MED ORDER — ONDANSETRON HCL 4 MG PO TABS
4.0000 mg | ORAL_TABLET | Freq: Four times a day (QID) | ORAL | Status: DC | PRN
Start: 2017-04-15 — End: 2017-04-19

## 2017-04-15 MED ORDER — DULOXETINE HCL 60 MG PO CPEP
60.0000 mg | ORAL_CAPSULE | Freq: Every morning | ORAL | Status: DC
  Administered 2017-04-15 – 2017-04-19 (×5): 60 mg via ORAL
  Filled 2017-04-15 (×5): qty 1

## 2017-04-15 MED ORDER — PANCRELIPASE (LIP-PROT-AMYL) 12000-38000 UNITS PO CPEP
36000.0000 [IU] | ORAL_CAPSULE | Freq: Three times a day (TID) | ORAL | Status: DC
  Administered 2017-04-15 – 2017-04-19 (×12): 36000 [IU] via ORAL
  Filled 2017-04-15 (×12): qty 3

## 2017-04-15 MED ORDER — TIZANIDINE HCL 4 MG PO TABS
4.0000 mg | ORAL_TABLET | Freq: Three times a day (TID) | ORAL | Status: DC
  Administered 2017-04-15 – 2017-04-19 (×11): 4 mg via ORAL
  Filled 2017-04-15 (×12): qty 1

## 2017-04-15 MED ORDER — LEVOTHYROXINE SODIUM 112 MCG PO TABS
112.0000 ug | ORAL_TABLET | Freq: Every day | ORAL | Status: DC
  Administered 2017-04-16 – 2017-04-19 (×4): 112 ug via ORAL
  Filled 2017-04-15 (×4): qty 1

## 2017-04-15 MED ORDER — FENTANYL CITRATE (PF) 100 MCG/2ML IJ SOLN
100.0000 ug | Freq: Once | INTRAMUSCULAR | Status: AC
  Administered 2017-04-15: 100 ug via INTRAVENOUS
  Filled 2017-04-15: qty 2

## 2017-04-15 MED ORDER — ACETAMINOPHEN 325 MG PO TABS: 650.0000 mg | ORAL_TABLET | Freq: Four times a day (QID) | ORAL | Status: DC | PRN

## 2017-04-15 MED ORDER — FENOFIBRIC ACID 105 MG PO TABS: 105.0000 mg | ORAL_TABLET | Freq: Every day | ORAL | Status: DC

## 2017-04-15 MED ORDER — ASPIRIN 81 MG PO CHEW
81.0000 mg | CHEWABLE_TABLET | Freq: Every day | ORAL | Status: DC
  Administered 2017-04-15 – 2017-04-19 (×5): 81 mg via ORAL
  Filled 2017-04-15 (×5): qty 1

## 2017-04-15 MED ORDER — KETOROLAC TROMETHAMINE 30 MG/ML IJ SOLN
30.0000 mg | Freq: Once | INTRAMUSCULAR | Status: AC
  Administered 2017-04-15: 30 mg via INTRAVENOUS
  Filled 2017-04-15: qty 1

## 2017-04-15 MED ORDER — FENTANYL CITRATE (PF) 100 MCG/2ML IJ SOLN
25.0000 ug | INTRAMUSCULAR | Status: DC | PRN
  Administered 2017-04-15 – 2017-04-19 (×10): 25 ug via INTRAVENOUS
  Filled 2017-04-15 (×11): qty 2

## 2017-04-15 MED ORDER — ALBUTEROL SULFATE (2.5 MG/3ML) 0.083% IN NEBU: 2.5000 mg | INHALATION_SOLUTION | Freq: Four times a day (QID) | RESPIRATORY_TRACT | Status: DC | PRN

## 2017-04-15 MED ORDER — ALBUTEROL SULFATE HFA 108 (90 BASE) MCG/ACT IN AERS: 1.0000 | INHALATION_SPRAY | Freq: Four times a day (QID) | RESPIRATORY_TRACT | Status: DC | PRN

## 2017-04-15 MED ORDER — SODIUM CHLORIDE 0.9 % IV SOLN
INTRAVENOUS | Status: DC
  Administered 2017-04-15 – 2017-04-18 (×8): via INTRAVENOUS

## 2017-04-15 MED ORDER — ONDANSETRON HCL 4 MG/2ML IJ SOLN: 4.0000 mg | Freq: Four times a day (QID) | INTRAMUSCULAR | Status: DC | PRN

## 2017-04-15 MED ORDER — GABAPENTIN 300 MG PO CAPS
600.0000 mg | ORAL_CAPSULE | Freq: Three times a day (TID) | ORAL | Status: DC
  Administered 2017-04-15 – 2017-04-19 (×11): 600 mg via ORAL
  Filled 2017-04-15 (×12): qty 2

## 2017-04-15 MED ORDER — PREDNISONE 20 MG PO TABS
20.0000 mg | ORAL_TABLET | Freq: Every day | ORAL | Status: DC
  Administered 2017-04-15 – 2017-04-19 (×5): 20 mg via ORAL
  Filled 2017-04-15 (×5): qty 1

## 2017-04-15 MED ORDER — ENOXAPARIN SODIUM 40 MG/0.4ML ~~LOC~~ SOLN
40.0000 mg | SUBCUTANEOUS | Status: DC
  Administered 2017-04-15 – 2017-04-19 (×5): 40 mg via SUBCUTANEOUS
  Filled 2017-04-15 (×5): qty 0.4

## 2017-04-15 MED ORDER — OXYCODONE HCL 5 MG PO TABS
5.0000 mg | ORAL_TABLET | ORAL | Status: DC | PRN
  Administered 2017-04-15 – 2017-04-19 (×18): 5 mg via ORAL
  Filled 2017-04-15 (×18): qty 1

## 2017-04-15 MED ORDER — SODIUM CHLORIDE 0.9 % IV SOLN
INTRAVENOUS | Status: DC
  Administered 2017-04-15: 09:00:00 via INTRAVENOUS

## 2017-04-15 MED ORDER — CLONAZEPAM 0.5 MG PO TABS
0.5000 mg | ORAL_TABLET | Freq: Every day | ORAL | Status: DC
  Administered 2017-04-16 – 2017-04-18 (×3): 0.5 mg via ORAL
  Filled 2017-04-15 (×3): qty 1

## 2017-04-15 MED ORDER — TAMSULOSIN HCL 0.4 MG PO CAPS
0.4000 mg | ORAL_CAPSULE | Freq: Two times a day (BID) | ORAL | Status: DC
  Administered 2017-04-15 – 2017-04-19 (×8): 0.4 mg via ORAL
  Filled 2017-04-15 (×9): qty 1

## 2017-04-15 NOTE — ED Triage Notes (Signed)
PT c/o continued abdominal pain since ED visit on 04/13/17. PT denies any N/V/D and states normal BM yesterday. PT denies any urinary symptoms.

## 2017-04-15 NOTE — ED Provider Notes (Signed)
[facility] EMERGENCY DEPARTMENT Provider Note   CSN: [id number] Arrival date & time: 04/15/17  4098     History   Chief Complaint Chief Complaint  Patient presents with  . Abdominal Pain    HPI ZEBULEN GALLEN is a 62 y.o. male.  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  I saw this patient 2 days ago when he was seen for pancreatitis, at that time his vital signs were unremarkable, his exam was consistent with mild pancreatitis and when he did not improve with morphine but had a relatively unremarkable workup with just a lipase of 70 and a negative CT scan the opportunity was taken to give oral medication for which he improved and was amenable to discharge.  He was given very specific instructions to have a clear liquid diet and to return should his symptoms worsen.  He reports that he has had nothing but liquids, Jell-O and popsicles and has done okay with regards to pain until last night when his pain came back and intensified.  He tried taking an oxycodone tablet which did not give any relief, he continues to have a intense pain in his midepigastrium with associated nausea, no diarrhea, no fevers.  Past Medical History:  Diagnosis Date  . Alcoholism (HCC)   . Asthma   . Back pain   . Fibromyalgia   . GERD (gastroesophageal reflux disease)   . High cholesterol   . Hypertension   . Hypothyroidism   . Myocardial infarct [facility])    cardiologist in town  . Pancreatitis    with pseudocysts  . Pneumonia   . Stroke [facility])    right leg weakness    Patient Active Problem List   Diagnosis Date Noted  . Other pancytopenia (HCC) 02/06/2013  . Pseudocyst, pancreas 02/03/2013  . Acute pancreatitis 06/28/2012  . Pancreatic abscess 06/28/2012  . Chest pain epsode  4/17 - relieved with NTG 06/28/2012  . SIRS (systemic inflammatory response syndrome) (HCC) 06/27/2012  . Acute respiratory failure (HCC) 06/26/2012  . Unspecified protein-calorie malnutrition (HCC) 06/26/2012  . 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 Elevated triglycerides with high cholesterol 06/10/2012  . Unspecified hypothyroidism 06/07/2012  . Depressive disorder, not elsewhere classified 06/07/2012  . CAD (coronary artery disease) of artery bypass graft 05/30/2012  . Hypokalemia 05/30/2012  . Abnormal LFTs (liver function tests) 05/30/2012  . Back pain   . Stroke (HCC)   . Fibromyalgia   . Myocardial infarct [facility])     Past Surgical History:  Procedure Laterality Date  . BACK SURGERY    . CHOLECYSTECTOMY  2009  . FINGER SURGERY    . PANCREATIC PSEUDOCYST DRAINAGE  2014       Home Medications    Prior to Admission medications   Medication Sig Start Date End Date Taking? Authorizing Provider  albuterol (PROAIR HFA) 108 (90 Base) MCG/ACT inhaler Inhale 1-2 puffs into the lungs every 6 (six) hours as needed for wheezing or shortness of breath.    [provider]  aspirin 81 MG chewable tablet Chew by mouth.    [provider]  clonazePAM (KLONOPIN) 0.5 MG tablet Take 0.5 mg by mouth at bedtime. *May  take one tablet three times daily as needed for anxiety* 12/14/14   [provider]  DULoxetine (CYMBALTA) 60 MG capsule Take 60 mg by mouth every morning.     [provider]  Fenofibric Acid 105 MG TABS Take 105 mg by mouth at bedtime.  12/28/14   [provider]  gabapentin (NEURONTIN) 600 MG tablet Take 600 mg by mouth 3 (three) times daily.    [provider]  levothyroxine (SYNTHROID, LEVOTHROID) 112 MCG tablet Take 112 mcg by mouth every morning.     [provider]  Multiple  Vitamins-Minerals (MULTIVITAMIN ADULT PO) Take 1 tablet by mouth daily.    [provider]  ondansetron (ZOFRAN ODT) 4 MG disintegrating tablet Take 1 tablet (4 mg total) by mouth every 8 (eight) hours as needed for nausea. 04/13/17   Eber Hong, MD  oxyCODONE-acetaminophen (PERCOCET/ROXICET) 5-325 MG tablet Take 1-2 tablets by mouth every 6 (six) hours as needed for severe pain. 02/01/16   Horton, Mayer Masker, MD  Pancrelipase, Lip-Prot-Amyl, (CREON) 6000 units CPEP Take by mouth.    [provider]  polyethylene glycol (MIRALAX / GLYCOLAX) packet Take by mouth. 07/19/12   [provider]  predniSONE (DELTASONE) 10 MG tablet Take 2 tablets by mouth daily. 04/11/17   [provider]  tamsulosin (FLOMAX) 0.4 MG CAPS capsule Take 0.4 mg by mouth 2 (two) times daily.  12/28/14   [provider]  tiZANidine (ZANAFLEX) 4 MG tablet Take 4 mg by mouth 3 (three) times daily.     [provider]    Family History Family History  Problem Relation Age of Onset  . Diabetes Mother   . Heart attack Mother   . Diabetes Brother   . Heart attack Brother   . Brain cancer Brother   . Diabetes Brother   . Cancer Father        ? stomach  . Heart attack Father   . Pancreatitis Neg Hx     Social History Social History   Tobacco Use  . Smoking status: Former Smoker    Packs/day: 0.40    Types: Cigarettes  . Smokeless tobacco: Never Used  Substance Use Topics  . Alcohol use: No    Alcohol/week: 0.0 oz    Comment: previously used alcohol and quit 6 years ago  . Drug use: Yes    Types: Marijuana    Comment: daily     Allergies   Amoxicillin; Dilaudid [hydromorphone hcl]; and Hydromorphone   Review of Systems Review of Systems  All other systems reviewed and are negative.    Physical Exam Updated Vital Signs BP 116/70   Pulse (!) 49   Temp 97.9 F (36.6 C) (Oral)   Resp 18   Ht 5\' 7"  (1.702 m)   Wt 70.8 kg (156 lb)   SpO2 98%   BMI  24.43 kg/m   Physical Exam  Constitutional: He appears well-developed and well-nourished. No distress.  HENT:  Head: Normocephalic and atraumatic.  Mouth/Throat: Oropharynx is clear and moist. No oropharyngeal exudate.  Eyes: Conjunctivae and EOM are normal. Pupils are equal, round, and reactive to light. Right eye exhibits no discharge. Left eye exhibits no discharge. No scleral icterus.  Neck: Normal range of motion. Neck supple. No JVD present. No thyromegaly present.  Cardiovascular: Normal rate, regular rhythm, normal heart sounds and intact distal pulses. Exam reveals no gallop and no friction rub.  No murmur heard. Mild bradycardia  Pulmonary/Chest: Effort normal and breath  sounds normal. No respiratory distress. He has no wheezes. He has no rales.  Abdominal: Soft. Bowel sounds are normal. He exhibits no distension and no mass. There is tenderness ( Tenderness in the mid epigastrium with mild guarding, no other abdominal tenderness very soft).  Musculoskeletal: Normal range of motion. He exhibits no edema or tenderness.  Lymphadenopathy:    He has no cervical adenopathy.  Neurological: He is alert. Coordination normal.  Skin: Skin is warm and dry. No rash noted. No erythema.  Psychiatric: He has a normal mood and affect. His behavior is normal.  Nursing note and vitals reviewed.    ED Treatments / Results  Labs (all labs ordered are listed, but only abnormal results are displayed) Labs Reviewed  CBC - Abnormal; Notable for the following components:      Result Value   Hemoglobin 12.2 (*)    HCT 37.0 (*)    All other components within normal limits  LIPASE, BLOOD - Abnormal; Notable for the following components:   Lipase 60 (*)    All other components within normal limits  COMPREHENSIVE METABOLIC PANEL - Abnormal; Notable for the following components:   Glucose, Bld 100 (*)    AST 12 (*)    All other components within normal limits  CBG MONITORING, ED - Abnormal;  Notable for the following components:   Glucose-Capillary 103 (*)    All other components within normal limits    Radiology Ct Abdomen Pelvis W Contrast  Result Date: 04/13/2017 CLINICAL DATA:  mid abd pain since last night. Reports nausea and diarrhea, no vomiting. Reports has history of an abd stent and says when he eats high fat foods he has a lot of pain.Pt co mid abd pain noted hx surgery of pancreatitis +n,-v,+d began last night EXAM: CT ABDOMEN AND PELVIS WITH CONTRAST TECHNIQUE: Multidetector CT imaging of the abdomen and pelvis was performed using the standard protocol following bolus administration of intravenous contrast. CONTRAST:  ISOVUE-300 IOPAMIDOL (ISOVUE-300) INJECTION 61% COMPARISON:  02/01/2016 FINDINGS: Lower chest: No acute abnormality. Hepatobiliary: Previous cholecystectomy. Mild scratch the moderate central intrahepatic biliary ductal dilatation and prominence of the CBD, seen down to the ampullary, stable since previous. No focal liver lesion. Pancreas: Focal parenchymal loss at the level the pancreatic neck. The small fluid collection seen previously has resolved. There is no ductal dilatation. 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 Spleen: Normal in size without focal abnormality. Adrenals/Urinary Tract: Normal adrenals. Bilateral nephrolithiasis, largest stone on the left 0.4 cm in the upper pole, on the right 0.3 cm. No hydronephrosis. Urinary bladder incompletely distended. Stomach/Bowel: Stomach is nondistended. Small gastric varices. Small bowel is nondilated. Normal appendix. The colon is nondilated with multiple descending and sigmoid diverticula; no significant adjacent inflammatory/edematous change. 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 No abdominal or pelvic  adenopathy. Reproductive: Mild prostatic enlargement. Other: No ascites.  No free air. Musculoskeletal: Broad supraumbilical ventral hernia involving small bowel loops and mesenteric fat without evidence of obstruction or strangulation. Facet DJD in the lower lumbar spine. Negative for fracture or worrisome bone lesion. IMPRESSION: 1. No acute findings. 2. Bilateral nephrolithiasis without hydronephrosis. 3. Resolution of peripancreatic pseudocyst seen on the prior study. 4. 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 5. Descending and sigmoid diverticulosis. Electronically Signed   By:  Corlis Leak M.D.   On: 04/13/2017 10:31    Procedures Procedures (including critical care time)  Medications Ordered in ED Medications  0.9 %  sodium chloride infusion ( Intravenous New Bag/Given 04/15/17 0914)  fentaNYL (SUBLIMAZE) injection 100 mcg (100 mcg Intravenous Given 04/15/17 0816)     Initial Impression / Assessment and Plan / ED Course  I have reviewed the triage vital signs and the nursing notes.  Pertinent labs & imaging results that were available during my care of the patient were reviewed by me and considered in my medical decision making (see chart for details).     [name] testing on a prior visit suggested pancreatitis, I am not sure how much of an elevation in lipase the patient will have given his history of recurrent inflammatory episodes as well as a history of necrotizing pancreatitis however his symptoms are definitely consistent with that and I am concerned with his lack of oral fluid intake, lack of nutritional intake and recurrent pain.  I will give him fentanyl, IV fluids containing dextrose and the patient will likely need to be admitted to the hospital.  Labs are similar to prior visit, the patient still has a slight elevation in lipase, because of his inability to tolerate  orals and increasing pain will admit to the hospital.  I appreciate the hospitalist Dr. [name] and his willingness to take care of Mr. Espin  Final Clinical Impressions(s) / ED Diagnoses   Final diagnoses:  Acute pancreatitis without infection or necrosis, unspecified pancreatitis type    ED Discharge Orders    None       Eber Hong, MD 04/15/17 [phone]

## 2017-04-15 NOTE — H&P (Signed)
[facility] H&P   Patient Demographics:    Justin [name], is a 62 y.o. male  MRN: [id number]   DOB - 13-Dec-1955  Admit Date - 04/15/2017  Outpatient Primary MD for the patient is [name], MD  Referring MD: Dr. Hyacinth Meeker  Outpatient Specialists: None  Patient coming from: Home  Chief Complaint  Patient presents with  . Abdominal Pain      HPI:    Justin [name]  is a 62 y.o. male, 62 year old male with history of alcoholic pancreatitis including necrotizing pancreatitis and pseudocyst, CAD with history of CABG, hypothyroidism, hypertension presented to the ED 2 days back with epigastric pain worsened with meal intake, radiating to the right and left upper quadrant without associated nausea, vomiting or diarrhea.  Reports symptoms are typical of his pancreatitis. Patient denies any fevers or chills, headache, blurred vision, dizziness, dysuria or weakness.  Reports poor p.o. intake.  In the ED lipase was 70 with negative CT scan of the abdomen.  Patient was discharged on oral oxycodone and instructed to be on clear liquid for 1-2 days.  Patient reports that he tried to eat liquids, Jell-O and popsicles and did fine for 1 day until last evening when again the pain returned and was worse than before.  Oxycodone did not relieve his symptoms. Patient reports quitting alcohol about 9 years back, denies smoking tobacco but smokes marijuana once a week.  Denies any new medications including over-the-counter medicine or supplements.  In the ED vitals were stable except for heart rate occasionally in the 40s, blood work showed hemoglobin of 12.2, normal chemistry including LFTs, lipase of 60.  Given his persistent pain and appearing dehydrated on exam he was started on IV fluid, given IV fentanyl and hospitalist consulted for observation.    Review of systems:    In addition to the HPI above,    No Fever-chills, No Headache, No changes with Vision or hearing, No problems swallowing food or Liquids, No Chest pain, Cough or Shortness of Breath, Epigastric abdominal pain+++, no Nausea or vomiting, Bowel movements are regular, No Blood in stool or Urine, No dysuria, No new skin rashes or bruises, No new joints pains-aches,  Generalized weakness, tingling, numbness in any extremity, No recent weight gain or loss, No polyuria, polydypsia or polyphagia, No significant Mental Stressors.     With Past History of the following :    Past Medical History:  Diagnosis Date  . Alcoholism (HCC)   . Asthma   . Back pain   . Fibromyalgia   . GERD (gastroesophageal reflux disease)   . High cholesterol   . Hypertension   . Hypothyroidism   . Myocardial infarct [facility])    cardiologist in town  . Pancreatitis    with pseudocysts  . Pneumonia   . Stroke [facility])    right leg weakness  Past Surgical History:  Procedure Laterality Date  . BACK SURGERY    . CHOLECYSTECTOMY  2009  . FINGER SURGERY    . PANCREATIC PSEUDOCYST DRAINAGE  2014      Social History:     Social History   Tobacco Use  . Smoking status: Former Smoker    Packs/day: 0.40    Types: Cigarettes  . Smokeless tobacco: Never Used  Substance Use Topics  . Alcohol use: No    Alcohol/week: 0.0 oz    Comment: previously used alcohol and quit 6 years ago     Lives -home with wife  Mobility -independent     Family History :     Family History  Problem Relation Age of Onset  . Diabetes Mother   . Heart attack Mother   . Diabetes Brother   . Heart attack Brother   . Brain cancer Brother   . Diabetes Brother   . Cancer Father        ? stomach  . Heart attack Father   . Pancreatitis Neg Hx       Home Medications:   Prior to Admission medications   Medication Sig Start Date End Date Taking? Authorizing Provider  albuterol (PROAIR HFA) 108 (90 Base) MCG/ACT inhaler Inhale 1-2 puffs into  the lungs every 6 (six) hours as needed for wheezing or shortness of breath.   Yes [provider]  aspirin 81 MG chewable tablet Chew by mouth.   Yes [provider]  clonazePAM (KLONOPIN) 0.5 MG tablet Take 0.5 mg by mouth at bedtime. *May take one tablet three times daily as needed for anxiety* 12/14/14  Yes [provider]  DULoxetine (CYMBALTA) 60 MG capsule Take 60 mg by mouth every morning.    Yes [provider]  Fenofibric Acid 105 MG TABS Take 105 mg by mouth at bedtime.  12/28/14  Yes [provider]  gabapentin (NEURONTIN) 600 MG tablet Take 600 mg by mouth 3 (three) times daily.   Yes [provider]  levothyroxine (SYNTHROID, LEVOTHROID) 112 MCG tablet Take 112 mcg by mouth every morning.    Yes [provider]  Multiple Vitamins-Minerals (MULTIVITAMIN ADULT PO) Take 1 tablet by mouth daily.   Yes [provider]  ondansetron (ZOFRAN ODT) 4 MG disintegrating tablet Take 1 tablet (4 mg total) by mouth every 8 (eight) hours as needed for nausea. 04/13/17  Yes Eber Hong, MD  oxyCODONE-acetaminophen (PERCOCET/ROXICET) 5-325 MG tablet Take 1-2 tablets by mouth every 6 (six) hours as needed for severe pain. 02/01/16  Yes Horton, Mayer Masker, MD  Pancrelipase, Lip-Prot-Amyl, (CREON) 6000 units CPEP Take 2-4 capsules by mouth 5 (five) times daily. Patient takes 4 capsules by mouth three times a day with meals and 2 capsules by mouth twice a day with snacks   Yes [provider]  polyethylene glycol (MIRALAX / GLYCOLAX) packet Take by mouth. 07/19/12  Yes [provider]  predniSONE (DELTASONE) 10 MG tablet Take 2 tablets by mouth daily. 04/11/17  Yes [provider]  tamsulosin (FLOMAX) 0.4 MG CAPS capsule Take 0.4 mg by mouth 2 (two) times daily.  12/28/14  Yes [provider]  tiZANidine (ZANAFLEX) 4 MG tablet Take 4 mg by mouth 3 (three) times daily.    Yes [provider]      Allergies:     Allergies  Allergen Reactions  . Amoxicillin Hives and Swelling  . Dilaudid [Hydromorphone Hcl] Swelling  . Hydromorphone Swelling  Physical Exam:   Vitals  Blood pressure (!) 108/58, pulse (!) 45, temperature 97.9 F (36.6 C), temperature source Oral, resp. rate 18, height 5\' 7"  (1.702 m), weight 70.8 kg (156 lb), SpO2 99 %.   General: Middle-aged male lying in bed not in distress HEENT: Pupils reactive bilaterally, EOMI, no pallor, dry mucosa, supple neck, Chest: Clear to auscultation bilaterally CVS: Normal S1-S2, no murmur rub or gallop GI: Soft, nondistended, bowel sounds present, epigastric tenderness to palpation Musculoskeletal: Warm, no edema CNS: Alert and oriented  Data Review:    CBC Recent Labs  Lab 04/13/17 0854 04/15/17 0816  WBC 5.0 4.9  HGB 12.0* 12.2*  HCT 36.8* 37.0*  PLT 202 200  MCV 87.0 86.2  MCH 28.4 28.4  MCHC 32.6 33.0  RDW 14.8 14.7   ------------------------------------------------------------------------------------------------------------------  Chemistries  Recent Labs  Lab 04/13/17 0854 04/15/17 0816  NA 139 141  K 3.5 3.5  CL 108 108  CO2 22 23  GLUCOSE 127* 100*  BUN 19 17  CREATININE 0.91 0.90  CALCIUM 9.1 9.3  AST 12* 12*  ALT 16* 20  ALKPHOS 57 60  BILITOT 0.4 0.4   ------------------------------------------------------------------------------------------------------------------ estimated creatinine clearance is 79.6 mL/min (by C-G formula based on SCr of 0.9 mg/dL). ------------------------------------------------------------------------------------------------------------------ No results for input(s): TSH, T4TOTAL, T3FREE, THYROIDAB in the last 72 hours.  Invalid input(s): FREET3  Coagulation profile No results for input(s): INR, PROTIME in the last 168 hours. ------------------------------------------------------------------------------------------------------------------- No results for  input(s): DDIMER in the last 72 hours. -------------------------------------------------------------------------------------------------------------------  Cardiac Enzymes No results for input(s): CKMB, TROPONINI, MYOGLOBIN in the last 168 hours.  Invalid input(s): CK ------------------------------------------------------------------------------------------------------------------ No results found for: BNP   ---------------------------------------------------------------------------------------------------------------  Urinalysis    Component Value Date/Time   COLORURINE YELLOW 04/13/2017 0834   APPEARANCEUR CLEAR 04/13/2017 0834   LABSPEC 1.020 04/13/2017 0834   PHURINE 5.0 04/13/2017 0834   GLUCOSEU NEGATIVE 04/13/2017 0834   HGBUR NEGATIVE 04/13/2017 0834   BILIRUBINUR NEGATIVE 04/13/2017 0834   KETONESUR NEGATIVE 04/13/2017 0834   PROTEINUR NEGATIVE 04/13/2017 0834   UROBILINOGEN 0.2 06/22/2013 0333   NITRITE NEGATIVE 04/13/2017 0834   LEUKOCYTESUR NEGATIVE 04/13/2017 0834    ----------------------------------------------------------------------------------------------------------------   Imaging Results:    No results found.  My personal review of EKG: Pending   Assessment & Plan:    Principal Problem:   Acute on chronic pancreatitis Placed on observation.  IV hydration with normal saline 125 cc/h.  Resume oxycodone q. 4-6 hours and add IV fentanyl every 4 hours as needed.  Keep n.p.o. for now and may start clears later in the evening if symptoms better.  Supportive care with as needed Zofran. CT scan was done 2 days ago showing resolution of peripancreatic pseudocyst seen on prior study, sigmoid diverticulosis, gastric varices and bilateral nephrolithiasis without hydronephrosis.  Does not need a repeat CT at this time unless symptoms worsen or fail to improve. I will resume his Creon at a higher dose and add Pepcid twice daily  Active Problems:   CAD (coronary  artery disease) of artery bypass graft On aspirin.  Hypothyroidism Continue Synthroid       DVT Prophylaxis: Lovenox  AM Labs Ordered, also please review Full Orders  Family Communication: Admission, patients condition and plan of care including tests being ordered have been discussed with the patient Code Status full code  Likely DC to home tomorrow if improved and tolerating advance diet  Condition: Fair  Consults called: None  Admission status: Observation  Time spent  in minutes : 45   Shari Natt M.D on 04/15/2017 at 11:03 AM  Between 7am to 7pm - Pager - [phone]. After 7pm go to [email or web address] - password [facility]  [facility] - Office  [phone]

## 2017-04-16 DIAGNOSIS — K861 Other chronic pancreatitis: Secondary | ICD-10-CM | POA: Diagnosis not present

## 2017-04-16 DIAGNOSIS — R001 Bradycardia, unspecified: Secondary | ICD-10-CM | POA: Diagnosis not present

## 2017-04-16 DIAGNOSIS — K859 Acute pancreatitis without necrosis or infection, unspecified: Secondary | ICD-10-CM | POA: Diagnosis not present

## 2017-04-16 DIAGNOSIS — F329 Major depressive disorder, single episode, unspecified: Secondary | ICD-10-CM | POA: Diagnosis not present

## 2017-04-16 LAB — BASIC METABOLIC PANEL
Anion gap: 8 (ref 5–15)
BUN: 15 mg/dL (ref 6–20)
CALCIUM: 8.6 mg/dL — AB (ref 8.9–10.3)
CHLORIDE: 111 mmol/L (ref 101–111)
CO2: 23 mmol/L (ref 22–32)
CREATININE: 0.83 mg/dL (ref 0.61–1.24)
Glucose, Bld: 99 mg/dL (ref 65–99)
Potassium: 3.5 mmol/L (ref 3.5–5.1)
SODIUM: 142 mmol/L (ref 135–145)

## 2017-04-16 LAB — GLUCOSE, CAPILLARY: GLUCOSE-CAPILLARY: 99 mg/dL (ref 65–99)

## 2017-04-16 LAB — HIV ANTIBODY (ROUTINE TESTING W REFLEX): HIV SCREEN 4TH GENERATION: NONREACTIVE

## 2017-04-16 NOTE — Progress Notes (Signed)
**Note De-identified  Obfuscation** EKG complete and placed in patient chart 

## 2017-04-16 NOTE — Progress Notes (Signed)
PROGRESS NOTE                                                                                                                                                                                                             Patient Demographics:    Justin [name], is a 62 y.o. male, DOB - 1955-06-08, MBW:[id number]  Admit date - 04/15/2017   Admitting Physician Eddie North, MD  Outpatient Primary MD for the patient is [name], MD  LOS - 0  Outpatient Specialists:none  Chief Complaint  Patient presents with  . 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  Patient admitted with acute pancreatitis.    Subjective:   Was placed on clear liquid last night but patient started having pain and could not tolerate.  Made n.p.o.   Assessment  & Plan :    Principal Problem:   Acute on chronic pancreatitis Could not tolerate clears.  Keep n.p.o.  IV fluids.  Pain control with as needed oxycodone and fentanyl. He knew pancreatic enzymes.   Active Problems:   CAD (coronary artery disease) of artery bypass graft Continue aspirin.  Not on any other medication.  Sinus bradycardia Heart rate noted to be in 40s frequently.  Patient asymptomatic.  Check EKG.  Check TSH, potassium and magnesium.  Will monitor on telemetry.  Depression Continue Cymbalta      Code Status : Full code  Family Communication  : None at bedside  Disposition Plan  : Possibly in 1-2 days if improved  Barriers For Discharge : Active symptoms  Consults  : None  Procedures  : None  DVT Prophylaxis  :  Lovenox  -  Lab Results  Component Value Date   PLT 200 04/15/2017    Antibiotics  :   Anti-infectives (From admission, onward)   None        Objective:   Vitals:   04/15/17 2034 04/15/17 2245 04/16/17 0448 04/16/17 0742  BP: 99/75 (!) 93/48 (!) 100/45 126/62  Pulse: (!) 52  (!) 48 Marland Kitchen)  46  Resp: 18  17 18   Temp: 98.2 F (36.8 C)  97.9 F (36.6 C)   TempSrc: Oral  Oral   SpO2: 97%  99% 99%  Weight:      Height:        Wt Readings from Last 3 Encounters:  04/15/17 67 kg (147 lb 11.3 oz)  04/13/17 70.8 kg (156 lb)  06/12/16 73.3 kg (161 lb 9.6 oz)     Intake/Output Summary (Last 24 hours) at 04/16/2017 0949 Last data filed at 04/15/2017 1800 Gross per 24 hour  Intake 583.75 ml  Output -  Net 583.75 ml     Physical Exam  Gen: not in distress HEENT: no pallor, moist mucosa, supple neck Chest: clear b/l, no added sounds CVS: S1 and S2 bradycardic, no murmurs GI: soft, nondistended, bowel sounds present, epigastric tenderness to pressure Musculoskeletal: warm, no edema     Data Review:    CBC Recent Labs  Lab 04/13/17 0854 04/15/17 0816  WBC 5.0 4.9  HGB 12.0* 12.2*  HCT 36.8* 37.0*  PLT 202 200  MCV 87.0 86.2  MCH 28.4 28.4  MCHC 32.6 33.0  RDW 14.8 14.7    Chemistries  Recent Labs  Lab 04/13/17 0854 04/15/17 0816 04/16/17 0615  NA 139 141 142  K 3.5 3.5 3.5  CL 108 108 111  CO2 22 23 23   GLUCOSE 127* 100* 99  BUN 19 17 15   CREATININE 0.91 0.90 0.83  CALCIUM 9.1 9.3 8.6*  AST 12* 12*  --   ALT 16* 20  --   ALKPHOS 57 60  --   BILITOT 0.4 0.4  --    ------------------------------------------------------------------------------------------------------------------ No results for input(s): CHOL, HDL, LDLCALC, TRIG, CHOLHDL, LDLDIRECT in the last 72 hours.  Lab Results  Component Value Date   HGBA1C 6.2 (H) 05/30/2012   ------------------------------------------------------------------------------------------------------------------ No  results for input(s): TSH, T4TOTAL, T3FREE, THYROIDAB in the last 72 hours.  Invalid input(s): FREET3 ------------------------------------------------------------------------------------------------------------------ No results for input(s): VITAMINB12, FOLATE, FERRITIN, TIBC, IRON, RETICCTPCT in the last 72 hours.  Coagulation profile No results for input(s): INR, PROTIME in the last 168 hours.  No results for input(s): DDIMER in the last 72 hours.  Cardiac Enzymes No results for input(s): CKMB, TROPONINI, MYOGLOBIN in the last 168 hours.  Invalid input(s): CK ------------------------------------------------------------------------------------------------------------------ No results found for: BNP  Inpatient Medications  Scheduled Meds: . aspirin  81 mg Oral Daily  . clonazePAM  0.5 mg Oral QHS  . DULoxetine  60 mg Oral q morning - 10a  . enoxaparin (LOVENOX) injection  40 mg Subcutaneous Q24H  . fenofibrate  108 mg Oral Daily  . gabapentin  600 mg Oral TID  . levothyroxine  112 mcg Oral QAC breakfast  . lipase/protease/amylase  36,000 Units Oral TID AC  . predniSONE  20 mg Oral Daily  . tamsulosin  0.4 mg Oral BID  . tiZANidine  4 mg Oral TID   Continuous Infusions: . sodium chloride 125 mL/hr at 04/16/17 0403   PRN Meds:.acetaminophen **OR** acetaminophen, albuterol, fentaNYL (SUBLIMAZE) injection, ondansetron **OR** ondansetron (ZOFRAN) IV, oxyCODONE  Micro Results No results found for this or any previous visit (from the past 240 hour(s)).  Radiology Reports Ct Abdomen Pelvis W Contrast  Result Date: 04/13/2017 CLINICAL DATA:  mid abd pain since last night. Reports nausea and diarrhea, no vomiting. Reports has history of an abd stent and says when he eats high fat foods he has a lot of pain.Pt co mid abd pain  noted hx surgery of pancreatitis +n,-v,+d began last night EXAM: CT ABDOMEN AND PELVIS WITH CONTRAST TECHNIQUE: Multidetector CT imaging of the abdomen and  pelvis was performed using the standard protocol following bolus administration of intravenous contrast. CONTRAST:  ISOVUE-300 IOPAMIDOL (ISOVUE-300) INJECTION 61% COMPARISON:  02/01/2016 FINDINGS: Lower chest: No acute abnormality. Hepatobiliary: Previous cholecystectomy. Mild scratch the moderate central intrahepatic biliary ductal dilatation and prominence of the CBD, seen down to the ampullary, stable since previous. No focal liver lesion. Pancreas: Focal parenchymal loss at the level the pancreatic neck. The small fluid collection seen previously has resolved. There is no ductal dilatation. 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 Spleen: Normal in size without focal abnormality. Adrenals/Urinary Tract: Normal adrenals. Bilateral nephrolithiasis, largest stone on the left 0.4 cm in the upper pole, on the right 0.3 cm. No hydronephrosis. Urinary bladder incompletely distended. Stomach/Bowel: Stomach is nondistended. Small gastric varices. Small bowel is nondilated. Normal appendix. The colon is nondilated with multiple descending and sigmoid diverticula; no significant adjacent inflammatory/edematous change. 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 No abdominal or pelvic adenopathy. Reproductive: Mild prostatic enlargement. Other: No ascites.  No free air. Musculoskeletal: Broad supraumbilical ventral hernia involving small bowel loops and mesenteric fat without evidence of obstruction or strangulation. Facet DJD in the lower lumbar spine. Negative for fracture or worrisome bone lesion. IMPRESSION: 1. No acute findings. 2. Bilateral nephrolithiasis without hydronephrosis. 3. Resolution of peripancreatic pseudocyst seen on the prior study. 4. 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 5. Descending and sigmoid diverticulosis. Electronically Signed   By: Corlis Leak M.D.   On: 04/13/2017 10:31    Time Spent in minutes  25   Marjoria Mancillas M.D on 04/16/2017 at 9:49 AM  Between 7am to 7pm - Pager - [phone]  After 7pm go to [email or web address] - password [facility]  [facility] -  Office  [phone]

## 2017-04-17 DIAGNOSIS — K859 Acute pancreatitis without necrosis or infection, unspecified: Secondary | ICD-10-CM | POA: Diagnosis not present

## 2017-04-17 DIAGNOSIS — R001 Bradycardia, unspecified: Secondary | ICD-10-CM | POA: Diagnosis not present

## 2017-04-17 LAB — TSH: TSH: 3.003 u[IU]/mL (ref 0.350–4.500)

## 2017-04-17 MED ORDER — PRO-STAT SUGAR FREE PO LIQD
30.0000 mL | Freq: Two times a day (BID) | ORAL | Status: DC
  Administered 2017-04-17 – 2017-04-19 (×5): 30 mL via ORAL
  Filled 2017-04-17 (×5): qty 30

## 2017-04-17 MED ORDER — BOOST / RESOURCE BREEZE PO LIQD CUSTOM
1.0000 | Freq: Three times a day (TID) | ORAL | Status: DC
  Administered 2017-04-17 – 2017-04-19 (×5): 1 via ORAL

## 2017-04-17 NOTE — Progress Notes (Addendum)
PROGRESS NOTE                                                                                                                                                                                                             Patient Demographics:    Justin [name], is a 62 y.o. male, DOB - 1955/04/27, OIN:[id number]  Admit date - 04/15/2017   Admitting Physician Eddie North, MD  Outpatient Primary MD for the patient is [name], MD  LOS - 0  Outpatient Specialists:none  Chief Complaint  Patient presents with  . 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  Patient admitted with acute pancreatitis.    Subjective:   Pain slightly better and feels extremely hungry wanting to eat.  Stable on telemetry overnight.   Assessment  & Plan :    Principal Problem:   Acute on chronic pancreatitis Start clears today.  Continue pain control with as needed oxycodone and fentanyl.  IV fluids. Continue pancreatic enzymes (current dose increased).   Active Problems:   CAD (coronary artery disease) of artery bypass graft Continue aspirin.  Not on any other medication.  Avoid beta-blocker due to sinus bradycardia.  Sinus bradycardia Heart rate noted to be in 40s frequently.  EKG shows sinus bradycardia at 45, no ST-T changes.  Monitored on telemetry for 24 hours without any acute changes.  Heart rate rate mostly stable in the 50s past 24 hours. check TSH.  Depression Continue Cymbalta      Code Status : Full code  Family Communication  : Wife at bedside  Disposition Plan  : Possibly in 1-2 days  if improved and tolerating diet  Barriers For Discharge : Active symptoms  Consults  : None  Procedures  : None  DVT Prophylaxis  :  Lovenox -  Lab Results  Component Value Date   PLT 200 04/15/2017    Antibiotics  :   Anti-infectives (From admission, onward)   None        Objective:   Vitals:   04/16/17  1035 04/16/17 1700 04/16/17 2200 04/17/17 0512  BP:  126/67 123/68 136/72  Pulse:  60 (!) 52 (!) 50  Resp:  18 20 18   Temp:  98.1 F (36.7 C) 98.3 F (36.8 C) 98 F (36.7 C)  TempSrc:  Oral Oral Oral  SpO2: 95% 98% 98% 99%  Weight:      Height:        Wt Readings from Last 3 Encounters:  04/15/17 67 kg (147 lb 11.3 oz)  04/13/17 70.8 kg (156 lb)  06/12/16 73.3 kg (161 lb 9.6 oz)     Intake/Output Summary (Last 24 hours) at 04/17/2017 1142 Last data filed at 04/17/2017 0900 Gross per 24 hour  Intake 4614.58 ml  Output -  Net 4614.58 ml     Physical Exam General: Not in distress HEENT: Moist mucosa, supple neck Chest: Clear bilaterally CVS: Normal S1 and S2, no murmurs GI: Soft, epigastric tenderness to pressure, bowel sounds present, nondistended Musculoskeletal: Warm, no edema     Data Review:    CBC Recent Labs  Lab 04/13/17 0854 04/15/17 0816  WBC 5.0 4.9  HGB 12.0* 12.2*  HCT 36.8* 37.0*  PLT 202 200  MCV 87.0 86.2  MCH 28.4 28.4  MCHC 32.6 33.0  RDW 14.8 14.7    Chemistries  Recent Labs  Lab 04/13/17 0854 04/15/17 0816 04/16/17 0615  NA 139 141 142  K 3.5 3.5 3.5  CL 108 108 111  CO2 22 23 23   GLUCOSE 127* 100* 99  BUN 19 17 15   CREATININE 0.91 0.90 0.83  CALCIUM 9.1 9.3 8.6*  AST 12* 12*  --   ALT 16* 20  --   ALKPHOS 57 60  --   BILITOT 0.4 0.4  --    ------------------------------------------------------------------------------------------------------------------ No results for input(s): CHOL, HDL, LDLCALC, TRIG, CHOLHDL, LDLDIRECT in the last 72 hours.  Lab Results  Component Value Date   HGBA1C 6.2 (H)  05/30/2012   ------------------------------------------------------------------------------------------------------------------ No results for input(s): TSH, T4TOTAL, T3FREE, THYROIDAB in the last 72 hours.  Invalid input(s): FREET3 ------------------------------------------------------------------------------------------------------------------ No results for input(s): VITAMINB12, FOLATE, FERRITIN, TIBC, IRON, RETICCTPCT in the last 72 hours.  Coagulation profile No results for input(s): INR, PROTIME in the last 168 hours.  No results for input(s): DDIMER in the last 72 hours.  Cardiac Enzymes No results for input(s): CKMB, TROPONINI, MYOGLOBIN in the last 168 hours.  Invalid input(s): CK ------------------------------------------------------------------------------------------------------------------ No results found for: BNP  Inpatient Medications  Scheduled Meds: . aspirin  81 mg Oral Daily  . clonazePAM  0.5 mg Oral QHS  . DULoxetine  60 mg Oral q morning - 10a  . enoxaparin (LOVENOX) injection  40 mg Subcutaneous Q24H  . fenofibrate  108 mg Oral Daily  . gabapentin  600 mg Oral TID  . levothyroxine  112 mcg Oral QAC breakfast  . lipase/protease/amylase  36,000 Units Oral TID AC  . predniSONE  20 mg Oral Daily  . tamsulosin  0.4 mg Oral BID  . tiZANidine  4 mg Oral TID   Continuous Infusions: . sodium chloride 125 mL/hr at 04/17/17 0455   PRN Meds:.acetaminophen **OR** acetaminophen, albuterol, fentaNYL (SUBLIMAZE) injection, ondansetron **OR** ondansetron (ZOFRAN) IV, oxyCODONE  Micro Results No results found for this or any previous visit (from the past 240 hour(s)).  Radiology Reports Ct Abdomen Pelvis W Contrast  Result Date: 04/13/2017 CLINICAL DATA:  mid abd pain since last night. Reports nausea and diarrhea, no vomiting. Reports has history of an abd stent and  says when he eats high fat foods he has a lot of pain.Pt co mid abd pain noted hx surgery of  pancreatitis +n,-v,+d began last night EXAM: CT ABDOMEN AND PELVIS WITH CONTRAST TECHNIQUE: Multidetector CT imaging of the abdomen and pelvis was performed using the standard protocol following bolus administration of intravenous contrast. CONTRAST:  ISOVUE-300 IOPAMIDOL (ISOVUE-300) INJECTION 61% COMPARISON:  02/01/2016 FINDINGS: Lower chest: No acute abnormality. Hepatobiliary: Previous cholecystectomy. Mild scratch the moderate central intrahepatic biliary ductal dilatation and prominence of the CBD, seen down to the ampullary, stable since previous. No focal liver lesion. Pancreas: Focal parenchymal loss at the level the pancreatic neck. The small fluid collection seen previously has resolved. There is no ductal dilatation. 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 Spleen: Normal in size without focal abnormality. Adrenals/Urinary Tract: Normal adrenals. Bilateral nephrolithiasis, largest stone on the left 0.4 cm in the upper pole, on the right 0.3 cm. No hydronephrosis. Urinary bladder incompletely distended. Stomach/Bowel: Stomach is nondistended. Small gastric varices. Small bowel is nondilated. Normal appendix. The colon is nondilated with multiple descending and sigmoid diverticula; no significant adjacent inflammatory/edematous change. 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 No abdominal or pelvic adenopathy. Reproductive: Mild prostatic enlargement. Other: No ascites.  No free air. Musculoskeletal: Broad supraumbilical ventral hernia involving small bowel loops and mesenteric fat without evidence of obstruction or strangulation. Facet DJD in the lower lumbar spine. Negative for fracture or worrisome bone lesion. IMPRESSION: 1. No acute findings. 2. Bilateral nephrolithiasis without hydronephrosis. 3.  Resolution of peripancreatic pseudocyst seen on the prior study. 4. 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 5. Descending and sigmoid diverticulosis. Electronically Signed   By: Corlis Leak M.D.   On: 04/13/2017 10:31    Time Spent in minutes  25   Kimetha Trulson M.D on 04/17/2017 at 11:42 AM  Between 7am to 7pm - Pager - [phone]  After 7pm go to [email or web address] - password [facility]  [facility] -  Office  [phone]

## 2017-04-17 NOTE — Progress Notes (Signed)
Initial Nutrition Assessment  DOCUMENTATION CODES:  Not applicable  INTERVENTION:  Boost Breeze po TID, each supplement provides 250 kcal and 9 grams of protein  Will order 30 mL Prostat BID, each supplement provides 100 kcal and 15 grams of protein.  Food preferences Assessed  NUTRITION DIAGNOSIS:  Inadequate oral intake related to acute on chronic pancreatis/related abdominal pain w/ po intake as evidenced by an intake estimated to have met </= to 50% of needs for 4 days.   GOAL:  Patient will meet greater than or equal to 90% of their needs  MONITOR:  PO intake, Supplement acceptance, Diet advancement, Labs, Weight trends, I & O's  REASON FOR ASSESSMENT:  Malnutrition Screening Tool    ASSESSMENT:  62 y/o male PMHx Alcoholic Pancreatitis, necrotizing pancreatitis, pancreatic pseudocyst, CAD s/p CABG, HTN, CVA, MI, GERD. Recent ED visit 2/1 for epigastric pain. Was discharged, but returned 2 days later due to persistent pain. Admitted for management of likely acute on chronic pancreatitis.   Patient reports his symptoms have been going on since last Friday (4 days). He tried to eat liquids, but was also unable to tolerate these. Has had little to no intake since.   He says his weight at the time of this attack was 157 lbs. This weight is suspected to have been reported though; not documented as being measured. Long term, he was 161.6 in April and 156.1 lbs in July.   Pt denies any n/v/c. He does have some diarrhea.   At this time, patient still w/ significant abdominal pain. This morning, he only consumed his juice and a small amount of jello. He says he is improving though. He feels the increase in creon dosage helped him.   Given minimal protein in CL diet, RD recommended supplements to which he was agreeable. Also, took food preferences.   Physical Exam: Deferred at this time.  Labs: Lipase: 60, BG: 90-110 Meds: Prednisone, Creon, IVF, oxycodone  Recent Labs  Lab  04/13/17 0854 04/15/17 0816 04/16/17 0615  NA 139 141 142  K 3.5 3.5 3.5  CL 108 108 111  CO2 '22 23 23  ' BUN '19 17 15  ' CREATININE 0.91 0.90 0.83  CALCIUM 9.1 9.3 8.6*  GLUCOSE 127* 100* 99   NUTRITION - FOCUSED PHYSICAL EXAM: Deferred at this time  Diet Order:  Diet clear liquid Room service appropriate? Yes; Fluid consistency: Thin  EDUCATION NEEDS:  No education needs have been identified at this time  Skin:  Skin Assessment: Reviewed RN Assessment  Last BM:  2/4  Height:  Ht Readings from Last 1 Encounters:  04/15/17 '5\' 7"'  (1.702 m)   Weight:  Wt Readings from Last 1 Encounters:  04/15/17 147 lb 11.3 oz (67 kg)   Wt Readings from Last 10 Encounters:  04/15/17 147 lb 11.3 oz (67 kg)  04/13/17 156 lb (70.8 kg)  06/12/16 161 lb 9.6 oz (73.3 kg)  06/09/16 163 lb (73.9 kg)  02/01/16 158 lb (71.7 kg)  12/12/15 156 lb (70.8 kg)  11/09/15 156 lb (70.8 kg)  01/23/15 160 lb (72.6 kg)  09/29/14 157 lb (71.2 kg)  06/21/13 147 lb (66.7 kg)   Ideal Body Weight:  67.27 kg  BMI:  Body mass index is 23.13 kg/m.  Estimated Nutritional Needs:  Kcal:  2000-2200 (30-33 kcal/kg bw) Protein:  94-107g (1.4-1.6 G/kg bw) Fluid:  >2 L (30 ml/kg bw)  Burtis Junes RD, LDN, CNSC Clinical Nutrition Pager: [phone] 04/17/2017 1:05 PM

## 2017-04-17 NOTE — Plan of Care (Signed)
Pt required two episodes of pain management duration of shift. Will continue to monitor.

## 2017-04-18 DIAGNOSIS — K859 Acute pancreatitis without necrosis or infection, unspecified: Secondary | ICD-10-CM | POA: Diagnosis not present

## 2017-04-18 DIAGNOSIS — I25812 Atherosclerosis of bypass graft of coronary artery of transplanted heart without angina pectoris: Secondary | ICD-10-CM

## 2017-04-18 DIAGNOSIS — K219 Gastro-esophageal reflux disease without esophagitis: Secondary | ICD-10-CM | POA: Diagnosis not present

## 2017-04-18 DIAGNOSIS — E876 Hypokalemia: Secondary | ICD-10-CM | POA: Diagnosis not present

## 2017-04-18 MED ORDER — DICYCLOMINE HCL 10 MG PO CAPS: 10.0000 mg | ORAL_CAPSULE | Freq: Four times a day (QID) | ORAL | Status: DC | PRN

## 2017-04-18 MED ORDER — PANTOPRAZOLE SODIUM 40 MG PO TBEC
40.0000 mg | DELAYED_RELEASE_TABLET | Freq: Every day | ORAL | Status: DC
  Administered 2017-04-19: 40 mg via ORAL
  Filled 2017-04-18: qty 1

## 2017-04-18 NOTE — Progress Notes (Signed)
PROGRESS NOTE                                                                                                                                                                                                            Patient Demographics:    Justin [name], is a 62 y.o. male, DOB - 1955/08/02, MMH:[id number]  Admit date - 04/15/2017   Admitting Physician Eddie North, MD  Outpatient Primary MD for the patient is [name], MD  LOS - 0  Outpatient Specialists:none  Chief Complaint  Patient presents with  . 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  Patient admitted with acute pancreatitis.    Subjective:   Continue to have nausea and pain. Not feeling too good today. Reported avoiding using pain meds.    Assessment  & Plan :   Acute on chronic pancreatitis -still symptomatic (abd pain and ongoing nausea) -advise to use pain medications as prescribed and slowly continue advancing diet -will also add Protonix -patient reported indiscriminating diet prior to admission as culprit factor (he ate a whole pepperoni pizza). -continue Creon  -repeat CMET and Lipase in am -continue PRN antiemetics  CAD (coronary artery disease) of artery bypass graft -Continue aspirin.   -no B-blocker due to bradycardia  -denies CP and SOB -no acute ischemic changes on EKG.  Sinus bradycardia -no major abnormalities seen -patient in sinus bradycardia at rest -no ischemic changes. -will d/c telemetry  Depression -stable -continue cymbalta -no SI or hallucinations.   Code Status : Full  code  Family Communication  : no family at bedside   Disposition Plan  : hopefully soon; patient still struggling with PO intake and having nausea/abd pain.  Barriers For Discharge : Active symptoms  Consults  : None  Procedures  : None  DVT Prophylaxis  :  Lovenox -  Lab Results  Component Value Date   PLT 200 04/15/2017    Antibiotics  :   Anti-infectives (From admission, onward)   None      Objective:   Vitals:  04/17/17 2041 04/17/17 2137 04/18/17 1500 04/18/17 2116  BP: 132/66  107/61 124/71  Pulse: 63  (!) 56 (!) 57  Resp: 18  19 18   Temp: 98.3 F (36.8 C)  98.5 F (36.9 C) 98 F (36.7 C)  TempSrc: Oral  Oral Oral  SpO2: 99% 97% 98% 98%  Weight:      Height:        Wt Readings from Last 3 Encounters:  04/15/17 67 kg (147 lb 11.3 oz)  04/13/17 70.8 kg (156 lb)  06/12/16 73.3 kg (161 lb 9.6 oz)    Intake/Output Summary (Last 24 hours) at 04/18/2017 2205 Last data filed at 04/18/2017 1700 Gross per 24 hour  Intake 840 ml  Output 1500 ml  Net -660 ml    Physical Exam General: afebrile, complaining of abd pain (mid abd/epigastric region) and nausea. No CP, no SOB. HEENT: MMM, no thrush, no JVD Chest: CTA bilaterally  CVS: no murmurs, no rubs, no gallops, S1 and S2. GI: o guarding, no distension, positive BS. Patient reporting pain in mid/epigastric area with palpation. No BM's, but positive flatus. Musculoskeletal: no edema, no cyanosis.     Data Review:    CBC Recent Labs  Lab 04/13/17 0854 04/15/17 0816  WBC 5.0 4.9  HGB 12.0* 12.2*  HCT 36.8* 37.0*  PLT 202 200  MCV 87.0 86.2  MCH 28.4 28.4  MCHC 32.6 33.0  RDW 14.8 14.7    Chemistries  Recent Labs  Lab 04/13/17 0854 04/15/17 0816 04/16/17 0615  NA 139 141 142  K 3.5 3.5 3.5  CL 108 108 111  CO2 22 23 23   GLUCOSE 127* 100* 99  BUN 19 17 15   CREATININE 0.91 0.90 0.83  CALCIUM 9.1 9.3 8.6*  AST 12* 12*  --   ALT 16* 20  --   ALKPHOS 57 60  --   BILITOT 0.4 0.4  --      Lab Results  Component Value Date   HGBA1C 6.2 (H) 05/30/2012    Recent Labs    04/17/17 1216  TSH 3.003    Inpatient Medications  Scheduled Meds: . aspirin  81 mg Oral Daily  . clonazePAM  0.5 mg Oral QHS  . DULoxetine  60 mg Oral q morning - 10a  . enoxaparin (LOVENOX) injection  40 mg Subcutaneous Q24H  . feeding supplement  1 Container Oral TID BM  . feeding supplement (PRO-STAT SUGAR FREE 64)  30 mL Oral BID  . fenofibrate  108 mg Oral Daily  . gabapentin  600 mg Oral TID  . levothyroxine  112 mcg Oral QAC breakfast  . lipase/protease/amylase  36,000 Units Oral TID AC  . [START ON 04/19/2017] pantoprazole  40 mg Oral Daily  . predniSONE  20 mg Oral Daily  . tamsulosin  0.4 mg Oral BID  . tiZANidine  4 mg Oral TID   Continuous Infusions: . sodium chloride 125 mL/hr at 04/17/17 1353   PRN Meds:.acetaminophen **OR** acetaminophen, albuterol, dicyclomine, fentaNYL (SUBLIMAZE) injection, ondansetron **OR** ondansetron (ZOFRAN) IV, oxyCODONE  Micro Results No results found for this or any previous visit (from the past 240 hour(s)).  Radiology Reports Ct Abdomen Pelvis W Contrast  Result Date: 04/13/2017 CLINICAL DATA:  mid abd pain since last night. Reports nausea and diarrhea, no vomiting. Reports has history of an abd stent and says when he eats high fat foods he has a lot of pain.Pt co mid abd pain noted hx surgery of pancreatitis +n,-v,+d began last  night EXAM: CT ABDOMEN AND PELVIS WITH CONTRAST TECHNIQUE: Multidetector CT imaging of the abdomen and pelvis was performed using the standard protocol following bolus administration of intravenous contrast. CONTRAST:  ISOVUE-300 IOPAMIDOL (ISOVUE-300) INJECTION 61% COMPARISON:  02/01/2016 FINDINGS: Lower chest: No acute abnormality. Hepatobiliary: Previous cholecystectomy. Mild scratch the moderate central intrahepatic biliary ductal dilatation and prominence of the CBD, seen down to the ampullary, stable since  previous. No focal liver lesion. Pancreas: Focal parenchymal loss at the level the pancreatic neck. The small fluid collection seen previously has resolved. There is no ductal dilatation. 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 Spleen: Normal in size without focal abnormality. Adrenals/Urinary Tract: Normal adrenals. Bilateral nephrolithiasis, largest stone on the left 0.4 cm in the upper pole, on the right 0.3 cm. No hydronephrosis. Urinary bladder incompletely distended. Stomach/Bowel: Stomach is nondistended. Small gastric varices. Small bowel is nondilated. Normal appendix. The colon is nondilated with multiple descending and sigmoid diverticula; no significant adjacent inflammatory/edematous change. 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 No abdominal or pelvic adenopathy. Reproductive: Mild prostatic enlargement. Other: No ascites.  No free air. Musculoskeletal: Broad supraumbilical ventral hernia involving small bowel loops and mesenteric fat without evidence of obstruction or strangulation. Facet DJD in the lower lumbar spine. Negative for fracture or worrisome bone lesion. IMPRESSION: 1. No acute findings. 2. Bilateral nephrolithiasis without hydronephrosis. 3. Resolution of peripancreatic pseudocyst seen on the prior study. 4. 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 5. Descending and sigmoid diverticulosis. Electronically Signed   By: Corlis Leak M.D.   On: 04/13/2017 10:31    Time Spent in minutes  25   Vassie Loll M.D on 04/18/2017 at 10:05 PM  Between 7am to 7pm - Pager - [phone]  After 7pm go to [email or web address] - password [facility]  [facility] -  Office  [phone]

## 2017-04-18 NOTE — Progress Notes (Signed)
Pt asked me to hold 0800 meds because he felt like he was going to vomit due to pain.

## 2017-04-19 DIAGNOSIS — R001 Bradycardia, unspecified: Secondary | ICD-10-CM | POA: Diagnosis present

## 2017-04-19 DIAGNOSIS — K859 Acute pancreatitis without necrosis or infection, unspecified: Secondary | ICD-10-CM | POA: Diagnosis present

## 2017-04-19 DIAGNOSIS — K861 Other chronic pancreatitis: Secondary | ICD-10-CM | POA: Diagnosis present

## 2017-04-19 DIAGNOSIS — F1290 Cannabis use, unspecified, uncomplicated: Secondary | ICD-10-CM | POA: Diagnosis present

## 2017-04-19 DIAGNOSIS — I252 Old myocardial infarction: Secondary | ICD-10-CM | POA: Diagnosis not present

## 2017-04-19 DIAGNOSIS — Z951 Presence of aortocoronary bypass graft: Secondary | ICD-10-CM | POA: Diagnosis not present

## 2017-04-19 DIAGNOSIS — I10 Essential (primary) hypertension: Secondary | ICD-10-CM | POA: Diagnosis present

## 2017-04-19 DIAGNOSIS — I2510 Atherosclerotic heart disease of native coronary artery without angina pectoris: Secondary | ICD-10-CM | POA: Diagnosis present

## 2017-04-19 DIAGNOSIS — J45909 Unspecified asthma, uncomplicated: Secondary | ICD-10-CM | POA: Diagnosis present

## 2017-04-19 DIAGNOSIS — E876 Hypokalemia: Secondary | ICD-10-CM

## 2017-04-19 DIAGNOSIS — F329 Major depressive disorder, single episode, unspecified: Secondary | ICD-10-CM

## 2017-04-19 DIAGNOSIS — Z885 Allergy status to narcotic agent status: Secondary | ICD-10-CM | POA: Diagnosis not present

## 2017-04-19 DIAGNOSIS — E7800 Pure hypercholesterolemia, unspecified: Secondary | ICD-10-CM | POA: Diagnosis present

## 2017-04-19 DIAGNOSIS — E860 Dehydration: Secondary | ICD-10-CM | POA: Diagnosis present

## 2017-04-19 DIAGNOSIS — M797 Fibromyalgia: Secondary | ICD-10-CM | POA: Diagnosis present

## 2017-04-19 DIAGNOSIS — I25812 Atherosclerosis of bypass graft of coronary artery of transplanted heart without angina pectoris: Secondary | ICD-10-CM | POA: Diagnosis not present

## 2017-04-19 DIAGNOSIS — Z881 Allergy status to other antibiotic agents status: Secondary | ICD-10-CM | POA: Diagnosis not present

## 2017-04-19 DIAGNOSIS — K219 Gastro-esophageal reflux disease without esophagitis: Secondary | ICD-10-CM | POA: Diagnosis present

## 2017-04-19 DIAGNOSIS — E039 Hypothyroidism, unspecified: Secondary | ICD-10-CM | POA: Diagnosis present

## 2017-04-19 LAB — COMPREHENSIVE METABOLIC PANEL
ALT: 29 U/L (ref 17–63)
AST: 12 U/L — ABNORMAL LOW (ref 15–41)
Albumin: 3.2 g/dL — ABNORMAL LOW (ref 3.5–5.0)
Alkaline Phosphatase: 77 U/L (ref 38–126)
Anion gap: 8 (ref 5–15)
BUN: 11 mg/dL (ref 6–20)
CHLORIDE: 111 mmol/L (ref 101–111)
CO2: 24 mmol/L (ref 22–32)
Calcium: 8.7 mg/dL — ABNORMAL LOW (ref 8.9–10.3)
Creatinine, Ser: 0.67 mg/dL (ref 0.61–1.24)
Glucose, Bld: 104 mg/dL — ABNORMAL HIGH (ref 65–99)
POTASSIUM: 3 mmol/L — AB (ref 3.5–5.1)
Sodium: 143 mmol/L (ref 135–145)
TOTAL PROTEIN: 6.1 g/dL — AB (ref 6.5–8.1)
Total Bilirubin: 0.2 mg/dL — ABNORMAL LOW (ref 0.3–1.2)

## 2017-04-19 LAB — LIPASE, BLOOD: LIPASE: 23 U/L (ref 11–51)

## 2017-04-19 MED ORDER — PANTOPRAZOLE SODIUM 40 MG PO TBEC: 40.0000 mg | DELAYED_RELEASE_TABLET | Freq: Every day | ORAL | 1 refills | Status: DC

## 2017-04-19 MED ORDER — DICYCLOMINE HCL 10 MG PO CAPS: 10.0000 mg | ORAL_CAPSULE | Freq: Four times a day (QID) | ORAL | 0 refills | Status: DC | PRN

## 2017-04-19 MED ORDER — ONDANSETRON 8 MG PO TBDP: 8.0000 mg | ORAL_TABLET | Freq: Three times a day (TID) | ORAL | 0 refills | Status: DC | PRN

## 2017-04-19 MED ORDER — POTASSIUM CHLORIDE CRYS ER 20 MEQ PO TBCR
40.0000 meq | EXTENDED_RELEASE_TABLET | ORAL | Status: AC
  Administered 2017-04-19 (×2): 40 meq via ORAL
  Filled 2017-04-19 (×2): qty 2

## 2017-04-19 MED ORDER — OXYCODONE HCL 5 MG PO TABS: 5.0000 mg | ORAL_TABLET | ORAL | 0 refills | Status: DC | PRN

## 2017-04-19 NOTE — Discharge Summary (Signed)
Physician Discharge Summary  Justin [name] QQP:[id number] DOB: March 23, 1955 DOA: 04/15/2017  PCP: [name], MD  Admit date: 04/15/2017 Discharge date: 04/19/2017  Time spent: 35 minutes  Recommendations for Outpatient Follow-up:  1. Repeat BMET to follow electrolytes and renal function  2. Reassess complete resolution of his abd pain and if needed further refill pain meds.    Discharge Diagnoses:  Acute on chronic pancreatitis CAD (coronary artery disease) of artery bypass graft Hx of Pseudocyst, pancreas GERD Depression Hypokalemia Sinus bradycardia   Discharge Condition: stable and improved. Discharge home with instructions to follow up with PCP in 10 days.  Diet recommendation: low fat diet   Filed Weights   04/15/17 0748 04/15/17 1139  Weight: 70.8 kg (156 lb) 67 kg (147 lb 11.3 oz)    History of present illness:  As per H&P written by Dr. Gonzella [name] on 04/13/17 62 y.o. male, 62 year old male with history of alcoholic pancreatitis including necrotizing pancreatitis and pseudocyst, CAD with history of CABG, hypothyroidism, hypertension presented to the ED 2 days back with epigastric pain worsened with meal intake, radiating to the right and left upper quadrant without associated nausea, vomiting or diarrhea.  Reports symptoms are typical of his pancreatitis. Patient denies any fevers or chills, headache, blurred vision, dizziness, dysuria or weakness.  Reports poor p.o. intake.  In the ED lipase was 70 with negative CT scan of the abdomen.  Patient was discharged on oral oxycodone and instructed to be on clear liquid for 1-2 days.  Patient reports that he tried to eat liquids, Jell-O and popsicles and did fine for 1 day until last evening when again the pain returned and was worse than before.  Oxycodone did not relieve his symptoms. Patient reports quitting alcohol about 9 years back, denies smoking tobacco but smokes marijuana once a week.  Denies any new medications including  over-the-counter medicine or supplements.  In the ED vitals were stable except for heart rate occasionally in the 40s, blood work showed hemoglobin of 12.2, normal chemistry including LFTs, lipase of 60.  Given his persistent pain and appearing dehydrated on exam he was started on IV fluid, given IV fentanyl and hospitalist consulted for admission and further management.   Hospital Course:  Acute on chronic pancreatitis -still symptomatic (abd pain and ongoing nausea) -advise to use pain medications as prescribed and slowly continue advancing diet to low fat. -will also discharge on daily Protonix -patient reported indiscriminating diet prior to admission as culprit factor (he ate a whole pepperoni pizza). Education about low fat diet importance provided. -Continue Creon  -CMET and Lipase stable at discharge -Continue PRN antiemetics  CAD (coronary artery disease) of artery bypass graft -Continue aspirin.   -no B-blocker due to bradycardia  -denies CP and SOB -no acute ischemic changes on EKG.  Sinus bradycardia -no major abnormalities seen -patient in sinus bradycardia at rest -no ischemic changes.  Depression -stable -continue cymbalta -no SI or hallucinations.   Hypokalemia: -electrolytes repleted -repeat BMET at follow up visit  Procedures:  See below for x-ray reports  Consultations:  None   Discharge Exam: Vitals:   04/19/17 0700 04/19/17 1405  BP: 137/68 125/66  Pulse: (!) 54 (!) 54  Resp: 18 19  Temp: 97.8 F (36.6 C) 98.5 F (36.9 C)  SpO2: 99% 98%    General: afebrile, still with intermittent abd pain (mid abd/epigastric region) and nausea; but able to tolerate PO meds and full liquid diet. He is looking to slowly continue advancing  diet to low fat. HEENT: MMM, no thrush, no JVD. Chest: CTA bilaterally  CVS: no murmurs, no rubs, no gallops, S1 and S2. GI: No guarding, no distension, positive BS. Patient reporting pain in mid/epigastric area  with palpation. No BM's, but positive flatus. Musculoskeletal: no edema, no cyanosis.   Discharge Instructions   Discharge Instructions    Discharge instructions   Complete by:  As directed    Follow low fat diet Take medications as prescribed  Keep yourself well hydrated  Arrange follow up with PCP in 10 days.     Allergies as of 04/19/2017      Reactions   Amoxicillin Hives, Swelling   Dilaudid [hydromorphone Hcl] Swelling   Hydromorphone Swelling      Medication List    STOP taking these medications   oxyCODONE-acetaminophen 5-325 MG tablet Commonly known as:  PERCOCET/ROXICET     TAKE these medications   aspirin 81 MG chewable tablet Chew by mouth.   clonazePAM 0.5 MG tablet Commonly known as:  KLONOPIN Take 0.5 mg by mouth at bedtime. *May take one tablet three times daily as needed for anxiety*   CREON 6000 units Cpep Generic drug:  Pancrelipase (Lip-Prot-Amyl) Take 2-4 capsules by mouth 5 (five) times daily. Patient takes 4 capsules by mouth three times a day with meals and 2 capsules by mouth twice a day with snacks   dicyclomine 10 MG capsule Commonly known as:  BENTYL Take 1 capsule (10 mg total) by mouth every 6 (six) hours as needed for spasms.   DULoxetine 60 MG capsule Commonly known as:  CYMBALTA Take 60 mg by mouth every morning.   Fenofibric Acid 105 MG Tabs Take 105 mg by mouth at bedtime.   gabapentin 600 MG tablet Commonly known as:  NEURONTIN Take 600 mg by mouth 3 (three) times daily.   levothyroxine 112 MCG tablet Commonly known as:  SYNTHROID, LEVOTHROID Take 112 mcg by mouth every morning.   MULTIVITAMIN ADULT PO Take 1 tablet by mouth daily.   ondansetron 8 MG disintegrating tablet Commonly known as:  ZOFRAN ODT Take 1 tablet (8 mg total) by mouth every 8 (eight) hours as needed for nausea or vomiting. What changed:    medication strength  how much to take  reasons to take this   oxyCODONE 5 MG immediate release  tablet Commonly known as:  Oxy IR/ROXICODONE Take 1 tablet (5 mg total) by mouth every 4 (four) hours as needed for moderate pain or severe pain.   pantoprazole 40 MG tablet Commonly known as:  PROTONIX Take 1 tablet (40 mg total) by mouth daily. Start taking on:  04/20/2017   polyethylene glycol packet Commonly known as:  MIRALAX / GLYCOLAX Take by mouth.   predniSONE 10 MG tablet Commonly known as:  DELTASONE Take 2 tablets by mouth daily.   PROAIR HFA 108 (90 Base) MCG/ACT inhaler Generic drug:  albuterol Inhale 1-2 puffs into the lungs every 6 (six) hours as needed for wheezing or shortness of breath.   tamsulosin 0.4 MG Caps capsule Commonly known as:  FLOMAX Take 0.4 mg by mouth 2 (two) times daily.   tiZANidine 4 MG tablet Commonly known as:  ZANAFLEX Take 4 mg by mouth 3 (three) times daily.      Allergies  Allergen Reactions  . Amoxicillin Hives and Swelling  . Dilaudid [Hydromorphone Hcl] Swelling  . Hydromorphone Swelling   Follow-up Information    Vyas, Angelina Pih, MD. Schedule an appointment as soon as  possible for a visit in 10 day(s).   Specialty:  Internal Medicine Contact information: [address] [place] [address] [phone]           The results of significant diagnostics from this hospitalization (including imaging, microbiology, ancillary and laboratory) are listed below for reference.    Significant Diagnostic Studies: Ct Abdomen Pelvis W Contrast  Result Date: 04/13/2017 CLINICAL DATA:  mid abd pain since last night. Reports nausea and diarrhea, no vomiting. Reports has history of an abd stent and says when he eats high fat foods he has a lot of pain.Pt co mid abd pain noted hx surgery of pancreatitis +n,-v,+d began last night EXAM: CT ABDOMEN AND PELVIS WITH CONTRAST TECHNIQUE: Multidetector CT imaging of the abdomen and pelvis was performed using the standard protocol following bolus administration of intravenous contrast. CONTRAST:   ISOVUE-300 IOPAMIDOL (ISOVUE-300) INJECTION 61% COMPARISON:  02/01/2016 FINDINGS: Lower chest: No acute abnormality. Hepatobiliary: Previous cholecystectomy. Mild scratch the moderate central intrahepatic biliary ductal dilatation and prominence of the CBD, seen down to the ampullary, stable since previous. No focal liver lesion. Pancreas: Focal parenchymal loss at the level the pancreatic neck. The small fluid collection seen previously has resolved. There is no ductal dilatation. 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 Spleen: Normal in size without focal abnormality. Adrenals/Urinary Tract: Normal adrenals. Bilateral nephrolithiasis, largest stone on the left 0.4 cm in the upper pole, on the right 0.3 cm. No hydronephrosis. Urinary bladder incompletely distended. Stomach/Bowel: Stomach is nondistended. Small gastric varices. Small bowel is nondilated. Normal appendix. The colon is nondilated with multiple descending and sigmoid diverticula; no significant adjacent inflammatory/edematous change. 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 No abdominal or pelvic adenopathy. Reproductive: Mild prostatic enlargement. Other: No ascites.  No free air. Musculoskeletal: Broad supraumbilical ventral hernia involving small bowel loops and mesenteric fat without evidence of obstruction or strangulation. Facet DJD in the lower lumbar spine. Negative for fracture or worrisome bone lesion. IMPRESSION: 1. No acute findings. 2. Bilateral nephrolithiasis without hydronephrosis. 3. Resolution of peripancreatic pseudocyst seen on the prior study. 4. 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 5. Descending and sigmoid diverticulosis.  Electronically Signed   By: Corlis Leak M.D.   On: 04/13/2017 10:31   Labs: Basic Metabolic Panel: Recent Labs  Lab 04/13/17 0854 04/15/17 0816 04/16/17 0615 04/19/17 0431  NA 139 141 142 143  K 3.5 3.5 3.5 3.0*  CL 108 108 111 111  CO2 22 23 23 24   GLUCOSE 127* 100* 99 104*  BUN 19 17 15 11   CREATININE 0.91 0.90 0.83 0.67  CALCIUM 9.1 9.3 8.6* 8.7*   Liver Function Tests: Recent Labs  Lab 04/13/17 0854 04/15/17 0816 04/19/17 0431  AST 12* 12* 12*  ALT 16* 20 29  ALKPHOS 57 60 77  BILITOT 0.4 0.4 0.2*  PROT 7.3 7.2 6.1*  ALBUMIN 4.1 4.0 3.2*   Recent Labs  Lab 04/13/17 0854 04/15/17 0816 04/19/17 0431  LIPASE 70* 60* 23   CBC: Recent Labs  Lab 04/13/17 0854 04/15/17 0816  WBC 5.0 4.9  HGB 12.0* 12.2*  HCT 36.8* 37.0*  MCV 87.0 86.2  PLT 202 200    CBG: Recent Labs  Lab 04/15/17 0820 04/16/17 1127  GLUCAP 103* 99    Signed:  06/13/17  Gwenlyn Perking MD.  [facility] 04/19/2017, 2:34 PM

## 2017-04-19 NOTE — Progress Notes (Signed)
IV removed-clean, dry, intact. Stable pt was wheeled to main entrance where his wife picked him up.

## 2017-04-19 NOTE — Progress Notes (Signed)
Patient states understanding of discharge instructions, prescriptions given 

## 2017-04-20 ENCOUNTER — Emergency Department (HOSPITAL_COMMUNITY): Payer: Medicare Other

## 2017-04-20 ENCOUNTER — Emergency Department (HOSPITAL_COMMUNITY)
Admission: EM | Admit: 2017-04-20 | Discharge: 2017-04-20 | Disposition: A | Discharge status: Home / Self Care | Payer: Medicare Other | Attending: Emergency Medicine | Admitting: Emergency Medicine

## 2017-04-20 ENCOUNTER — Encounter (HOSPITAL_COMMUNITY): Payer: Self-pay | Admitting: Emergency Medicine

## 2017-04-20 ENCOUNTER — Other Ambulatory Visit: Payer: Self-pay

## 2017-04-20 DIAGNOSIS — Z23 Encounter for immunization: Secondary | ICD-10-CM | POA: Insufficient documentation

## 2017-04-20 DIAGNOSIS — K858 Other acute pancreatitis without necrosis or infection: Secondary | ICD-10-CM | POA: Diagnosis not present

## 2017-04-20 DIAGNOSIS — I10 Essential (primary) hypertension: Secondary | ICD-10-CM | POA: Diagnosis not present

## 2017-04-20 DIAGNOSIS — R109 Unspecified abdominal pain: Secondary | ICD-10-CM | POA: Diagnosis present

## 2017-04-20 DIAGNOSIS — I2510 Atherosclerotic heart disease of native coronary artery without angina pectoris: Secondary | ICD-10-CM | POA: Diagnosis not present

## 2017-04-20 DIAGNOSIS — J45909 Unspecified asthma, uncomplicated: Secondary | ICD-10-CM | POA: Diagnosis not present

## 2017-04-20 DIAGNOSIS — Z79899 Other long term (current) drug therapy: Secondary | ICD-10-CM | POA: Insufficient documentation

## 2017-04-20 DIAGNOSIS — E039 Hypothyroidism, unspecified: Secondary | ICD-10-CM | POA: Insufficient documentation

## 2017-04-20 DIAGNOSIS — Z87891 Personal history of nicotine dependence: Secondary | ICD-10-CM | POA: Insufficient documentation

## 2017-04-20 LAB — CBC WITH DIFFERENTIAL/PLATELET
BASOS ABS: 0 10*3/uL (ref 0.0–0.1)
Basophils Relative: 0 %
EOS PCT: 1 %
Eosinophils Absolute: 0 10*3/uL (ref 0.0–0.7)
HEMATOCRIT: 34.7 % — AB (ref 39.0–52.0)
Hemoglobin: 11.6 g/dL — ABNORMAL LOW (ref 13.0–17.0)
LYMPHS PCT: 15 %
Lymphs Abs: 1.3 10*3/uL (ref 0.7–4.0)
MCH: 28.5 pg (ref 26.0–34.0)
MCHC: 33.4 g/dL (ref 30.0–36.0)
MCV: 85.3 fL (ref 78.0–100.0)
MONO ABS: 0.6 10*3/uL (ref 0.1–1.0)
Monocytes Relative: 7 %
NEUTROS ABS: 6.8 10*3/uL (ref 1.7–7.7)
Neutrophils Relative %: 77 %
PLATELETS: 169 10*3/uL (ref 150–400)
RBC: 4.07 MIL/uL — ABNORMAL LOW (ref 4.22–5.81)
RDW: 14.6 % (ref 11.5–15.5)
WBC: 8.7 10*3/uL (ref 4.0–10.5)

## 2017-04-20 LAB — COMPREHENSIVE METABOLIC PANEL
ALBUMIN: 3.6 g/dL (ref 3.5–5.0)
ALT: 27 U/L (ref 17–63)
AST: 14 U/L — AB (ref 15–41)
Alkaline Phosphatase: 81 U/L (ref 38–126)
Anion gap: 7 (ref 5–15)
BUN: 10 mg/dL (ref 6–20)
CHLORIDE: 108 mmol/L (ref 101–111)
CO2: 24 mmol/L (ref 22–32)
CREATININE: 0.78 mg/dL (ref 0.61–1.24)
Calcium: 9.1 mg/dL (ref 8.9–10.3)
GFR calc Af Amer: >60 mL/min (ref >60)
GFR calc non Af Amer: >60 mL/min (ref >60)
GLUCOSE: 108 mg/dL — AB (ref 65–99)
POTASSIUM: 3 mmol/L — AB (ref 3.5–5.1)
SODIUM: 139 mmol/L (ref 135–145)
Total Bilirubin: 0.4 mg/dL (ref 0.3–1.2)
Total Protein: 6.4 g/dL — ABNORMAL LOW (ref 6.5–8.1)

## 2017-04-20 LAB — LIPASE, BLOOD: Lipase: 33 U/L (ref 11–51)

## 2017-04-20 MED ORDER — TETANUS-DIPHTH-ACELL PERTUSSIS 5-2.5-18.5 LF-MCG/0.5 IM SUSP
0.5000 mL | Freq: Once | INTRAMUSCULAR | Status: AC
  Administered 2017-04-20: 0.5 mL via INTRAMUSCULAR
  Filled 2017-04-20: qty 0.5

## 2017-04-20 MED ORDER — FENTANYL CITRATE (PF) 100 MCG/2ML IJ SOLN
50.0000 ug | Freq: Once | INTRAMUSCULAR | Status: AC
  Administered 2017-04-20: 50 ug via INTRAVENOUS
  Filled 2017-04-20: qty 2

## 2017-04-20 MED ORDER — SODIUM CHLORIDE 0.9 % IV BOLUS (SEPSIS)
1000.0000 mL | Freq: Once | INTRAVENOUS | Status: AC
Start: 2017-04-20 — End: 2017-04-20
  Administered 2017-04-20: 1000 mL via INTRAVENOUS

## 2017-04-20 MED ORDER — POTASSIUM CHLORIDE CRYS ER 20 MEQ PO TBCR
20.0000 meq | EXTENDED_RELEASE_TABLET | Freq: Once | ORAL | Status: AC
  Administered 2017-04-20: 20 meq via ORAL
  Filled 2017-04-20: qty 1

## 2017-04-20 MED ORDER — SODIUM CHLORIDE 0.9 % IV SOLN: 1000.0000 mL | INTRAVENOUS | Status: DC

## 2017-04-20 MED ORDER — PROCHLORPERAZINE EDISYLATE 5 MG/ML IJ SOLN
10.0000 mg | Freq: Once | INTRAMUSCULAR | Status: AC
  Administered 2017-04-20: 10 mg via INTRAVENOUS
  Filled 2017-04-20: qty 2

## 2017-04-20 NOTE — Discharge Instructions (Signed)
Your potassium was low and oral potassium was given. Your lab test and ultra sound are negative for acute problem at this time. Please see your specialist at [facility] on Thursday as scheduled. Continue your pain and nausea medications if needed. Return to the Emergency Dept if any acute changes in your condition before your specialist visit.

## 2017-04-20 NOTE — ED Triage Notes (Signed)
Pt was just discharged from [facility] yesterday for pancreatitis. Pt states having left lower ABD pain that radiates to lower back. Pt took 2 oxycodone at 6 am this morning with no relief.

## 2017-04-20 NOTE — ED Notes (Signed)
Pt has an appointment at [facility] next Thursday with his pancreas specilist

## 2017-04-20 NOTE — ED Provider Notes (Signed)
[facility] EMERGENCY DEPARTMENT Provider Note   CSN: [id number] Arrival date & time: 04/20/17  0808     History   Chief Complaint Chief Complaint  Patient presents with  . Abdominal Pain    HPI Justin [name] is a 62 y.o. male.  Patient is a 62 year old male who presents to the emergency department with complaint of abdominal pain.  Patient and spouse state that the patient was admitted to the hospital on Sunday, February 3 because of pancreatitis.  The patient was discharged on yesterday February 7.  The patient states that he was still having some pain before he left the hospital.  He states that he was eating soft foods that were mostly bland but eating a very small portion of those.  He was placed on OxyContin for pain.  He states that he tried a baked sweet potato and steamed fish for dinner and shortly after that he began to have more pain.  He tried his OxyContin during the night and early morning, but could not find a comfortable position.  He had one episode of vomiting this morning, but had nausea during the night last night.  It is also of note that the patient's dog was in a fight with another dog.  In an attempt to break up the fight, the patient sustained a dog bite to his left fingers by his dog.  He states that his dog is up-to-date on immunizations.  He is unsure of the date of his last tetanus.  No other injury reported.   The history is provided by the patient and the spouse.  Abdominal Pain   Associated symptoms include nausea and vomiting. Pertinent negatives include dysuria, frequency, hematuria and arthralgias.    Past Medical History:  Diagnosis Date  . Alcoholism (HCC)   . Asthma   . Back pain   . Fibromyalgia   . GERD (gastroesophageal reflux disease)   . High cholesterol   . Hypertension   . Hypothyroidism   . Myocardial infarct [facility])    cardiologist in town  . Pancreatitis    with pseudocysts  . Pneumonia   . Stroke [facility])    right leg  weakness    Patient Active Problem List   Diagnosis Date Noted  . Other pancytopenia (HCC) 02/06/2013  . Pseudocyst, pancreas 02/03/2013  . Acute pancreatitis 06/28/2012  . Pancreatic abscess 06/28/2012  . Chest pain epsode 4/17 - relieved with NTG 06/28/2012  . SIRS (systemic inflammatory response syndrome) (HCC) 06/27/2012  . Acute respiratory failure (HCC) 06/26/2012  . Unspecified protein-calorie malnutrition (HCC) 06/26/2012  . 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 Elevated triglycerides with high cholesterol 06/10/2012  . Unspecified hypothyroidism 06/07/2012  . Depressive disorder, not elsewhere classified 06/07/2012  . CAD (coronary artery disease) of artery bypass graft 05/30/2012  . Hypokalemia 05/30/2012  . Abnormal LFTs (liver function tests) 05/30/2012  . Back pain   . Stroke (HCC)   . Fibromyalgia   . Myocardial infarct [facility])     Past Surgical History:  Procedure Laterality Date  . BACK SURGERY    . CHOLECYSTECTOMY  2009  . FINGER SURGERY    . PANCREATIC PSEUDOCYST DRAINAGE  2014       Home Medications    Prior to Admission medications   Medication Sig Start Date End Date Taking? Authorizing Provider  albuterol (PROAIR HFA) 108 (90 Base) MCG/ACT inhaler Inhale  1-2 puffs into the lungs every 6 (six) hours as needed for wheezing or shortness of breath.    [provider]  aspirin 81 MG chewable tablet Chew by mouth.    [provider]  clonazePAM (KLONOPIN) 0.5 MG tablet Take 0.5 mg by mouth at bedtime. *May take one tablet three times daily as needed for anxiety* 12/14/14   [provider]  dicyclomine (BENTYL) 10 MG capsule Take 1 capsule (10 mg total) by mouth every 6 (six) hours as needed for spasms. 04/19/17   Vassie Loll, MD  DULoxetine (CYMBALTA) 60 MG capsule Take 60 mg by mouth every morning.      [provider]  Fenofibric Acid 105 MG TABS Take 105 mg by mouth at bedtime.  12/28/14   [provider]  gabapentin (NEURONTIN) 600 MG tablet Take 600 mg by mouth 3 (three) times daily.    [provider]  levothyroxine (SYNTHROID, LEVOTHROID) 112 MCG tablet Take 112 mcg by mouth every morning.     [provider]  Multiple Vitamins-Minerals (MULTIVITAMIN ADULT PO) Take 1 tablet by mouth daily.    [provider]  ondansetron (ZOFRAN ODT) 8 MG disintegrating tablet Take 1 tablet (8 mg total) by mouth every 8 (eight) hours as needed for nausea or vomiting. 04/19/17   Vassie Loll, MD  oxyCODONE (OXY IR/ROXICODONE) 5 MG immediate release tablet Take 1 tablet (5 mg total) by mouth every 4 (four) hours as needed for moderate pain or severe pain. 04/19/17   Vassie Loll, MD  Pancrelipase, Lip-Prot-Amyl, (CREON) 6000 units CPEP Take 2-4 capsules by mouth 5 (five) times daily. Patient takes 4 capsules by mouth three times a day with meals and 2 capsules by mouth twice a day with snacks    [provider]  pantoprazole (PROTONIX) 40 MG tablet Take 1 tablet (40 mg total) by mouth daily. 04/20/17   Vassie Loll, MD  polyethylene glycol [facility] / Ethelene Hal) packet Take by mouth. 07/19/12   [provider]  predniSONE (DELTASONE) 10 MG tablet Take 2 tablets by mouth daily. 04/11/17   [provider]  tamsulosin (FLOMAX) 0.4 MG CAPS capsule Take 0.4 mg by mouth 2 (two) times daily.  12/28/14   [provider]  tiZANidine (ZANAFLEX) 4 MG tablet Take 4 mg by mouth 3 (three) times daily.     [provider]    Family History Family History  Problem Relation Age of Onset  . Diabetes Mother   . Heart attack Mother   . Diabetes Brother   . Heart attack Brother   . Brain cancer Brother   . Diabetes Brother   . Cancer Father        ? stomach  . Heart attack Father   . Pancreatitis Neg Hx     Social History Social  History   Tobacco Use  . Smoking status: Former Smoker    Packs/day: 0.40    Types: Cigarettes  . Smokeless tobacco: Never Used  Substance Use Topics  . Alcohol use: No    Alcohol/week: 0.0 oz    Comment: previously used alcohol and quit 6 years ago  . Drug use: Yes    Types: Marijuana    Comment: daily     Allergies   Amoxicillin; Dilaudid [hydromorphone hcl]; and Hydromorphone   Review of Systems Review of Systems  Constitutional: Positive for activity change, appetite change and chills.       All ROS Neg except as noted in HPI  HENT: Negative for nosebleeds.   Eyes: Negative for photophobia and discharge.  Respiratory: Negative for cough, shortness of breath and wheezing.   Cardiovascular: Negative for chest pain and palpitations.  Gastrointestinal: Positive for abdominal pain, nausea and vomiting. Negative for blood in stool.  Genitourinary: Negative for dysuria, frequency and hematuria.  Musculoskeletal: Negative for arthralgias, back pain and neck pain.  Skin: Negative.   Neurological: Negative for dizziness, seizures and speech difficulty.  Psychiatric/Behavioral: Negative for confusion and hallucinations.     Physical Exam Updated Vital Signs BP 117/80 (BP Location: Right Arm)   Pulse 69   Temp 98.4 F (36.9 C) (Oral)   Resp 19   Ht 5\' 7"  (1.702 m)   Wt 66.7 kg (147 lb)   SpO2 98%   BMI 23.02 kg/m   Physical Exam  Constitutional: He is oriented to person, place, and time. He appears well-developed and well-nourished.  Non-toxic appearance. He appears ill.  HENT:  Head: Normocephalic.  Right Ear: Tympanic membrane and external ear normal.  Left Ear: Tympanic membrane and external ear normal.  Mucous membranes are somewhat dry.  Eyes: EOM and lids are normal. Pupils are equal, round, and reactive to light.  Neck: Normal range of motion. Neck supple. Carotid bruit is not present.  Cardiovascular: Normal rate, regular rhythm, normal heart sounds, intact  distal pulses and normal pulses.  Pulmonary/Chest: Breath sounds normal. No respiratory distress. He has no wheezes. He has no rales.  Abdominal: Soft. Bowel sounds are normal. He exhibits no abdominal bruit and no mass. There is tenderness in the epigastric area, periumbilical area and left upper quadrant. There is no guarding.  Musculoskeletal: Normal range of motion.  Lymphadenopathy:       Head (right side): No submandibular adenopathy present.       Head (left side): No submandibular adenopathy present.    He has no cervical adenopathy.  Neurological: He is alert and oriented to person, place, and time. He has normal strength. No cranial nerve deficit or sensory deficit.  Skin: Skin is warm and dry.  Psychiatric: He has a normal mood and affect. His speech is normal.  Nursing note and vitals reviewed.    ED Treatments / Results  Labs (all labs ordered are listed, but only abnormal results are displayed) Labs Reviewed  COMPREHENSIVE METABOLIC PANEL  LIPASE, BLOOD  CBC WITH DIFFERENTIAL/PLATELET    EKG  EKG Interpretation None       Radiology No results found.  Procedures Procedures (including critical care time)  Medications Ordered in ED Medications  fentaNYL (SUBLIMAZE) injection 50 mcg (not administered)  prochlorperazine (COMPAZINE) injection 10 mg (not administered)  sodium chloride 0.9 % bolus 1,000 mL (not administered)    Followed by  0.9 %  sodium chloride infusion (not administered)  Tdap (BOOSTRIX) injection 0.5 mL (not administered)     Initial Impression / Assessment and Plan / ED Course  I have reviewed the triage vital signs and the nursing notes.  Pertinent labs & imaging results that were available during my care of the patient were reviewed by me and considered in my medical decision making (see chart for details).       Final Clinical Impressions(s) / ED Diagnoses MDM Vital signs within normal limits. Patient was recently discharged  from the hospital because of pancreatitis on yesterday.  Patient has abdominal pain that he says is very similar to his previous pancreas related pains.  Patient treated with IV fentanyl and Compazine as well as  IV fluids.   The potassium on the day conference of metabolic panel is slightly low at 3.0, the remainder of the conference of metabolic panel is well within normal limits.  The lipase is normal at 33. Complete blood count shows no acute changes, there is no shift to the left.  Ultrasound of the abdomen shows a chronic intrahepatic bile duct dilatation, but no acute dilatation, no acute stones appreciated.  The pancreas is obscured due to overlying bowel gas.  The patient feels much better after IV pain medications, feels that he can handle the pain with his personal medications at home.  Patient is scheduled for GI evaluation by a specialist at [facility] on next week.   Final diagnoses:  Other acute pancreatitis without infection or necrosis    ED Discharge Orders    None       Ivery Quale, PA-C 04/20/17 1655    Long, Arlyss Repress, MD 04/21/17 [id number]

## 2017-06-06 ENCOUNTER — Encounter (HOSPITAL_COMMUNITY): Payer: Self-pay | Admitting: Emergency Medicine

## 2017-06-06 ENCOUNTER — Other Ambulatory Visit: Payer: Self-pay

## 2017-06-06 ENCOUNTER — Emergency Department (HOSPITAL_COMMUNITY)
Admission: EM | Admit: 2017-06-06 | Discharge: 2017-06-06 | Disposition: A | Discharge status: Home / Self Care | Payer: Medicare Other | Attending: Emergency Medicine | Admitting: Emergency Medicine

## 2017-06-06 ENCOUNTER — Emergency Department (HOSPITAL_COMMUNITY): Payer: Medicare Other

## 2017-06-06 DIAGNOSIS — S62634B Displaced fracture of distal phalanx of right ring finger, initial encounter for open fracture: Secondary | ICD-10-CM | POA: Diagnosis not present

## 2017-06-06 DIAGNOSIS — E039 Hypothyroidism, unspecified: Secondary | ICD-10-CM | POA: Insufficient documentation

## 2017-06-06 DIAGNOSIS — Z7982 Long term (current) use of aspirin: Secondary | ICD-10-CM | POA: Diagnosis not present

## 2017-06-06 DIAGNOSIS — W28XXXA Contact with powered lawn mower, initial encounter: Secondary | ICD-10-CM | POA: Diagnosis not present

## 2017-06-06 DIAGNOSIS — S61314A Laceration without foreign body of right ring finger with damage to nail, initial encounter: Secondary | ICD-10-CM

## 2017-06-06 DIAGNOSIS — Z79899 Other long term (current) drug therapy: Secondary | ICD-10-CM | POA: Insufficient documentation

## 2017-06-06 DIAGNOSIS — I10 Essential (primary) hypertension: Secondary | ICD-10-CM | POA: Diagnosis not present

## 2017-06-06 DIAGNOSIS — J45909 Unspecified asthma, uncomplicated: Secondary | ICD-10-CM | POA: Insufficient documentation

## 2017-06-06 DIAGNOSIS — Y999 Unspecified external cause status: Secondary | ICD-10-CM | POA: Diagnosis not present

## 2017-06-06 DIAGNOSIS — Y93H2 Activity, gardening and landscaping: Secondary | ICD-10-CM | POA: Diagnosis not present

## 2017-06-06 DIAGNOSIS — Z87891 Personal history of nicotine dependence: Secondary | ICD-10-CM | POA: Insufficient documentation

## 2017-06-06 DIAGNOSIS — Y929 Unspecified place or not applicable: Secondary | ICD-10-CM | POA: Insufficient documentation

## 2017-06-06 DIAGNOSIS — S62639B Displaced fracture of distal phalanx of unspecified finger, initial encounter for open fracture: Secondary | ICD-10-CM

## 2017-06-06 MED ORDER — LIDOCAINE HCL (PF) 2 % IJ SOLN
INTRAMUSCULAR | Status: AC
  Filled 2017-06-06: qty 20

## 2017-06-06 MED ORDER — OXYCODONE-ACETAMINOPHEN 5-325 MG PO TABS: 1.0000 | ORAL_TABLET | ORAL | 0 refills | Status: DC | PRN

## 2017-06-06 MED ORDER — ONDANSETRON 4 MG PO TBDP
4.0000 mg | ORAL_TABLET | Freq: Once | ORAL | Status: AC
  Administered 2017-06-06: 4 mg via ORAL
  Filled 2017-06-06: qty 1

## 2017-06-06 MED ORDER — BUPIVACAINE HCL (PF) 0.25 % IJ SOLN
INTRAMUSCULAR | Status: AC
  Administered 2017-06-06: 30 mL
  Filled 2017-06-06: qty 30

## 2017-06-06 MED ORDER — POVIDONE-IODINE 10 % EX SOLN
CUTANEOUS | Status: AC
  Administered 2017-06-06: 2
  Filled 2017-06-06: qty 30

## 2017-06-06 MED ORDER — SULFAMETHOXAZOLE-TRIMETHOPRIM 800-160 MG PO TABS
1.0000 | ORAL_TABLET | Freq: Once | ORAL | Status: AC
  Administered 2017-06-06: 1 via ORAL
  Filled 2017-06-06: qty 1

## 2017-06-06 MED ORDER — SULFAMETHOXAZOLE-TRIMETHOPRIM 800-160 MG PO TABS: 1.0000 | ORAL_TABLET | Freq: Two times a day (BID) | ORAL | 0 refills | Status: AC

## 2017-06-06 MED ORDER — OXYCODONE-ACETAMINOPHEN 5-325 MG PO TABS
2.0000 | ORAL_TABLET | Freq: Once | ORAL | Status: AC
  Administered 2017-06-06: 2 via ORAL
  Filled 2017-06-06: qty 2

## 2017-06-06 MED ORDER — LIDOCAINE HCL (PF) 2 % IJ SOLN
10.0000 mL | Freq: Once | INTRAMUSCULAR | Status: AC
  Administered 2017-06-06: 10 mL

## 2017-06-06 NOTE — Discharge Instructions (Signed)
Take antibiotics as directed.  Pain medication as needed.  See Dr. Romeo Apple for recheck next week.  Recheck here in 2 days.  The flap of skin may die and scab over.

## 2017-06-06 NOTE — ED Triage Notes (Signed)
Cut right ring finger with lawn mower. Pt is UTD on tetanus

## 2017-06-07 NOTE — ED Provider Notes (Signed)
[facility] EMERGENCY DEPARTMENT Provider Note   CSN: [id number] Arrival date & time: 06/06/17  1922     History   Chief Complaint Chief Complaint  Patient presents with  . Laceration    finger    HPI Justin [name] is a 62 y.o. male.  The history is provided by the patient. No language interpreter was used.  Laceration   The incident occurred 3 to 5 hours ago. The laceration is located on the right arm. The laceration is 2 cm in size. The laceration mechanism was a a metal edge. The pain is moderate. The pain has been constant since onset. His tetanus status is UTD.  Pt cut right ring finger on a lawnmower blade.   Past Medical History:  Diagnosis Date  . Alcoholism (HCC)   . Asthma   . Back pain   . Fibromyalgia   . GERD (gastroesophageal reflux disease)   . High cholesterol   . Hypertension   . Hypothyroidism   . Myocardial infarct [facility])    cardiologist in town  . Pancreatitis    with pseudocysts  . Pneumonia   . Stroke [facility])    right leg weakness    Patient Active Problem List   Diagnosis Date Noted  . Other pancytopenia (HCC) 02/06/2013  . Pseudocyst, pancreas 02/03/2013  . Acute pancreatitis 06/28/2012  . Pancreatic abscess 06/28/2012  . Chest pain epsode 4/17 - relieved with NTG 06/28/2012  . SIRS (systemic inflammatory response syndrome) (HCC) 06/27/2012  . Acute respiratory failure (HCC) 06/26/2012  . Unspecified protein-calorie malnutrition (HCC) 06/26/2012  . 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 Elevated triglycerides with high cholesterol 06/10/2012  . Unspecified hypothyroidism 06/07/2012  . Depressive disorder, not elsewhere classified 06/07/2012  . CAD (coronary artery disease) of artery bypass graft 05/30/2012  . Hypokalemia 05/30/2012  . Abnormal LFTs (liver function tests) 05/30/2012  . Back pain   . Stroke (HCC)    . Fibromyalgia   . Myocardial infarct [facility])     Past Surgical History:  Procedure Laterality Date  . BACK SURGERY    . CHOLECYSTECTOMY  2009  . FINGER SURGERY    . PANCREATIC PSEUDOCYST DRAINAGE  2014        Home Medications    Prior to Admission medications   Medication Sig Start Date End Date Taking? Authorizing Provider  albuterol (PROAIR HFA) 108 (90 Base) MCG/ACT inhaler Inhale 1-2 puffs into the lungs every 6 (six) hours as needed for wheezing or shortness of breath.    [provider]  aspirin 81 MG chewable tablet Chew 81 mg by mouth daily.     [provider]  clonazePAM (KLONOPIN) 0.5 MG tablet Take 0.5 mg by mouth at bedtime. *May take one tablet three times daily as needed for anxiety* 12/14/14   [provider]  dicyclomine (BENTYL) 10 MG capsule Take 1 capsule (10 mg total) by mouth every 6 (six) hours as needed for spasms. 04/19/17   Vassie Loll, MD  DULoxetine (CYMBALTA) 60 MG capsule Take 60 mg by mouth every morning.     [provider]  Fenofibric Acid 105 MG TABS Take 105 mg by mouth at bedtime.  12/28/14   [provider]  gabapentin (NEURONTIN) 600 MG tablet Take 600 mg by mouth 3 (three) times daily.    [provider]  levothyroxine (SYNTHROID, LEVOTHROID) 112 MCG tablet Take 112  mcg by mouth every morning.     [provider]  Multiple Vitamins-Minerals (MULTIVITAMIN ADULT PO) Take 1 tablet by mouth daily.    [provider]  ondansetron (ZOFRAN ODT) 8 MG disintegrating tablet Take 1 tablet (8 mg total) by mouth every 8 (eight) hours as needed for nausea or vomiting. 04/19/17   Vassie Loll, MD  oxyCODONE (OXY IR/ROXICODONE) 5 MG immediate release tablet Take 1 tablet (5 mg total) by mouth every 4 (four) hours as needed for moderate pain or severe pain. 04/19/17   Vassie Loll, MD  oxyCODONE-acetaminophen (PERCOCET/ROXICET) 5-325 MG tablet Take 1 tablet by mouth every 4 (four) hours as  needed for severe pain. 06/06/17   Elson Areas, PA-C  Pancrelipase, Lip-Prot-Amyl, (CREON) 6000 units CPEP Take 2-4 capsules by mouth 5 (five) times daily. Patient takes 4 capsules by mouth three times a day with meals and 2 capsules by mouth twice a day with snacks    [provider]  pantoprazole (PROTONIX) 40 MG tablet Take 1 tablet (40 mg total) by mouth daily. 04/20/17   Vassie Loll, MD  polyethylene glycol [facility] / Ethelene Hal) packet Take 17 g by mouth daily as needed for mild constipation.  07/19/12   [provider]  predniSONE (DELTASONE) 10 MG tablet Take 2 tablets by mouth daily. 04/11/17   [provider]  sulfamethoxazole-trimethoprim (BACTRIM DS,SEPTRA DS) 800-160 MG tablet Take 1 tablet by mouth 2 (two) times daily for 7 days. 06/06/17 06/13/17  Elson Areas, PA-C  tamsulosin (FLOMAX) 0.4 MG CAPS capsule Take 0.4 mg by mouth 2 (two) times daily.  12/28/14   [provider]  tiZANidine (ZANAFLEX) 4 MG tablet Take 4 mg by mouth 3 (three) times daily.     [provider]    Family History Family History  Problem Relation Age of Onset  . Diabetes Mother   . Heart attack Mother   . Diabetes Brother   . Heart attack Brother   . Brain cancer Brother   . Diabetes Brother   . Cancer Father        ? stomach  . Heart attack Father   . Pancreatitis Neg Hx     Social History Social History   Tobacco Use  . Smoking status: Former Smoker    Packs/day: 0.40    Types: Cigarettes  . Smokeless tobacco: Never Used  Substance Use Topics  . Alcohol use: No    Alcohol/week: 0.0 oz    Comment: previously used alcohol and quit 6 years ago  . Drug use: Yes    Types: Marijuana    Comment: daily     Allergies   Amoxicillin; Dilaudid [hydromorphone hcl]; and Hydromorphone   Review of Systems Review of Systems  Skin: Positive for wound.  All other systems reviewed and are negative.    Physical Exam Updated Vital Signs Ht 5\' 7"   (1.702 m)   Wt 70.8 kg (156 lb)   BMI 24.43 kg/m   Physical Exam  Constitutional: He appears well-developed and well-nourished.  HENT:  Head: Normocephalic.  Musculoskeletal: He exhibits tenderness.  3cm round flap laceration distal right ring finger,  Dark color   Neurological: He is alert.  Skin: Skin is warm.  Psychiatric: He has a normal mood and affect.  Vitals reviewed.    ED Treatments / Results  Labs (all labs ordered are listed, but only abnormal results are displayed) Labs Reviewed - No data to display  EKG None  Radiology Dg  Finger Ring Right  Result Date: 06/06/2017 CLINICAL DATA:  Recent laceration in the fourth digit, initial encounter EXAM: RIGHT RING FINGER 2+V COMPARISON:  None. FINDINGS: Significant soft tissue irregularity is noted consistent with the recent injury. There is also been a comminuted fracture in the distal aspect of the fourth distal phalanx with bony fragments within the soft tissue defect. No radiopaque foreign body is noted. No other focal abnormality is seen. IMPRESSION: Soft tissue and distal phalangeal injury as described. Electronically Signed   By: Alcide Clever M.D.   On: 06/06/2017 21:03    Procedures .Marland KitchenLaceration Repair Date/Time: 06/07/2017 12:18 AM Performed by: Elson Areas, PA-C Authorized by: Elson Areas, PA-C   Consent:    Consent obtained:  Verbal   Consent given by:  Patient   Risks discussed:  Infection Anesthesia (see MAR for exact dosages):    Anesthesia method:  Nerve block   Block needle gauge:  27 G   Block anesthetic:  Bupivacaine 0.25% w/o epi   Block outcome:  Anesthesia achieved Laceration details:    Location:  Finger   Length (cm):  3   Depth (mm):  3 Repair type:    Repair type:  Complex Pre-procedure details:    Preparation:  Patient was prepped and draped in usual sterile fashion Exploration:    Limited defect created (wound extended): no     Wound exploration: wound explored through full  range of motion     Wound extent: vascular damage     Contaminated: no   Treatment:    Area cleansed with:  Betadine   Amount of cleaning:  Standard   Irrigation solution:  Sterile saline   Debridement:  Minimal   Undermining:  None   Scar revision: no   Skin repair:    Repair method:  Sutures   Suture size:  5-0   Suture material:  Prolene   Suture technique:  Simple interrupted Approximation:    Approximation:  Loose Post-procedure details:    Dressing:  Splint for protection and sterile dressing   Patient tolerance of procedure:  Tolerated well, no immediate complications Comments:     laceration goes through side of nail bed,  Nail removed,  4 sutures to nailbed,  Nail replaced with 2 3.0 prolene sutures,  Flap of skin replaced,  Color is still darker than other skin    (including critical care time)  Medications Ordered in ED Medications  lidocaine (XYLOCAINE) 2 % injection 10 mL (10 mLs Infiltration Given 06/06/17 2058)  oxyCODONE-acetaminophen (PERCOCET/ROXICET) 5-325 MG per tablet 2 tablet (2 tablets Oral Given 06/06/17 2057)  povidone-iodine (BETADINE) 10 % external solution (2 application  Given 06/06/17 2247)  bupivacaine (PF) (MARCAINE) 0.25 % injection (30 mLs  Given 06/06/17 2248)  ondansetron (ZOFRAN-ODT) disintegrating tablet 4 mg (4 mg Oral Given 06/06/17 2247)  sulfamethoxazole-trimethoprim (BACTRIM DS,SEPTRA DS) 800-160 MG per tablet 1 tablet (1 tablet Oral Given 06/06/17 2300)     Initial Impression / Assessment and Plan / ED Course  I have reviewed the triage vital signs and the nursing notes.  Pertinent labs & imaging results that were available during my care of the patient were reviewed by me and considered in my medical decision making (see chart for details).     I advised pt of fracture and possibility that skin flap may not be viable.   I have advised pt to call Dr. Romeo Apple to be seen.  Two day recheck here or with Dr. Romeo Apple.  Pt placed in a  splint   Final Clinical Impressions(s) / ED Diagnoses   Final diagnoses:  Open fracture of distal phalanx of digit of right hand  Laceration of right ring finger with damage to nail, foreign body presence unspecified, initial encounter    ED Discharge Orders        Ordered    sulfamethoxazole-trimethoprim (BACTRIM DS,SEPTRA DS) 800-160 MG tablet  2 times daily     06/06/17 2250    oxyCODONE-acetaminophen (PERCOCET/ROXICET) 5-325 MG tablet  Every 4 hours PRN     06/06/17 2250       Osie Cheeks 06/07/17 Lloyd Huger, MD 06/07/17 [phone]

## 2017-06-11 ENCOUNTER — Ambulatory Visit: Payer: Medicare Other | Admitting: Orthopedic Surgery

## 2017-06-11 ENCOUNTER — Encounter: Payer: Self-pay | Admitting: Orthopedic Surgery

## 2017-06-11 VITALS — BP 110/66 | HR 67 | Ht 67.0 in | Wt 155.0 lb

## 2017-06-11 DIAGNOSIS — S62664B Nondisplaced fracture of distal phalanx of right ring finger, initial encounter for open fracture: Secondary | ICD-10-CM

## 2017-06-11 NOTE — Progress Notes (Signed)
NEW PATIENT OFFICE VISIT   Chief Complaint  Patient presents with  . Finger Injury    finger right ring vs lawnmower blade (while sharpening blade) 06/06/17    62 yo male lawnmower injury to right ring finger  Treated in ER   5-day history of lawnmower blade injury right ring finger presents for further management C/o dull ache RRF times 5 days associated with decreased range of motion fingertip   Review of Systems  Constitutional: Negative for fever.  Neurological: Negative for tingling.     Past Medical History:  Diagnosis Date  . Alcoholism (HCC)   . Asthma   . Back pain   . Fibromyalgia   . GERD (gastroesophageal reflux disease)   . High cholesterol   . Hypertension   . Hypothyroidism   . Myocardial infarct [facility])    cardiologist in town  . Pancreatitis    with pseudocysts  . Pneumonia   . Stroke [facility])    right leg weakness    Past Surgical History:  Procedure Laterality Date  . BACK SURGERY    . CHOLECYSTECTOMY  2009  . FINGER SURGERY    . PANCREATIC PSEUDOCYST DRAINAGE  2014    Family History  Problem Relation Age of Onset  . Diabetes Mother   . Heart attack Mother   . Diabetes Brother   . Heart attack Brother   . Brain cancer Brother   . Diabetes Brother   . Cancer Father        ? stomach  . Heart attack Father   . Pancreatitis Neg Hx    Social History   Tobacco Use  . Smoking status: Former Smoker    Packs/day: 0.40    Types: Cigarettes  . Smokeless tobacco: Never Used  Substance Use Topics  . Alcohol use: No    Alcohol/week: 0.0 oz    Comment: previously used alcohol and quit 6 years ago  . Drug use: Yes    Types: Marijuana    Comment: daily    @ALL @  Current Meds  Medication Sig  . albuterol (PROAIR HFA) 108 (90 Base) MCG/ACT inhaler Inhale 1-2 puffs into the lungs every 6 (six) hours as needed for wheezing or shortness of breath.  aspirin 81 MG chewable tablet Chew 81 mg by mouth daily.   . clonazePAM (KLONOPIN) 0.5 MG  tablet Take 0.5 mg by mouth at bedtime. *May take one tablet three times daily as needed for anxiety*  . dicyclomine (BENTYL) 10 MG capsule Take 1 capsule (10 mg total) by mouth every 6 (six) hours as needed for spasms.  . DULoxetine (CYMBALTA) 60 MG capsule Take 60 mg by mouth every morning.   . Fenofibric Acid 105 MG TABS Take 105 mg by mouth at bedtime.   . gabapentin (NEURONTIN) 600 MG tablet Take 600 mg by mouth 3 (three) times daily.  Marland Kitchen levothyroxine (SYNTHROID, LEVOTHROID) 112 MCG tablet Take 112 mcg by mouth every morning.   . Multiple Vitamins-Minerals (MULTIVITAMIN ADULT PO) Take 1 tablet by mouth daily.  . ondansetron (ZOFRAN ODT) 8 MG disintegrating tablet Take 1 tablet (8 mg total) by mouth every 8 (eight) hours as needed for nausea or vomiting.  Marland Kitchen oxyCODONE (OXY IR/ROXICODONE) 5 MG immediate release tablet Take 1 tablet (5 mg total) by mouth every 4 (four) hours as needed for moderate pain or severe pain.  Marland Kitchen oxyCODONE-acetaminophen (PERCOCET/ROXICET) 5-325 MG tablet Take 1 tablet by mouth every 4 (four) hours as needed for severe pain.  Marland Kitchen  Pancrelipase, Lip-Prot-Amyl, (CREON) 6000 units CPEP Take 2-4 capsules by mouth 5 (five) times daily. Patient takes 4 capsules by mouth three times a day with meals and 2 capsules by mouth twice a day with snacks  . pantoprazole (PROTONIX) 40 MG tablet Take 1 tablet (40 mg total) by mouth daily.  . polyethylene glycol (MIRALAX / GLYCOLAX) packet Take 17 g by mouth daily as needed for mild constipation.   . sulfamethoxazole-trimethoprim (BACTRIM DS,SEPTRA DS) 800-160 MG tablet Take 1 tablet by mouth 2 (two) times daily for 7 days.  . tamsulosin (FLOMAX) 0.4 MG CAPS capsule Take 0.4 mg by mouth 2 (two) times daily.   Marland Kitchen tiZANidine (ZANAFLEX) 4 MG tablet Take 4 mg by mouth 3 (three) times daily.     BP 110/66   Pulse 67   Ht 5\' 7"  (1.702 m)   Wt 155 lb (70.3 kg)   BMI 24.28 kg/m   Physical Exam  Constitutional: He is oriented to person, place,  and time. He appears well-developed and well-nourished.  Vital signs have been reviewed and are stable. Gen. appearance the patient is well-developed and well-nourished with normal grooming and hygiene.   Neurological: He is alert and oriented to person, place, and time.  Skin: Skin is warm and dry. No erythema.  Psychiatric: He has a normal mood and affect.  Vitals reviewed.   Ortho Exam Ring finger right hand there is a multitude of sutures there is a dusky area on the thumb side of the digit DIP PIP joint motion is compromised no instability flexor and extensor tendon strength normal skin as described color normal tender to palpation soft slight sensory loss no lymphadenopathy  Opposite ring finger for comparison normal alignment skin color.  MEDICAL DECISION SECTION  xrays ordered?  No  My independent reading of xrays: I see 3 views of the right ring finger the tip shows a fracture of the phalanx alignment is normal multiple fragments are noted   Encounter Diagnosis  Name Primary?  . Open nondisplaced fracture of distal phalanx of right ring finger, initial encounter Yes     PLAN:   No orders of the defined types were placed in this encounter.  Wound care antibiotics pain medicine come back next week to get the sutures removed

## 2017-06-19 DIAGNOSIS — S62664B Nondisplaced fracture of distal phalanx of right ring finger, initial encounter for open fracture: Secondary | ICD-10-CM | POA: Insufficient documentation

## 2017-06-21 ENCOUNTER — Ambulatory Visit: Payer: Medicare Other | Admitting: Orthopedic Surgery

## 2017-06-21 ENCOUNTER — Encounter: Payer: Self-pay | Admitting: Orthopedic Surgery

## 2017-06-21 VITALS — BP 105/63 | HR 67 | Ht 67.0 in | Wt 161.0 lb

## 2017-06-21 DIAGNOSIS — S62664D Nondisplaced fracture of distal phalanx of right ring finger, subsequent encounter for fracture with routine healing: Secondary | ICD-10-CM | POA: Diagnosis not present

## 2017-06-21 MED ORDER — HYDROCODONE-ACETAMINOPHEN 5-325 MG PO TABS: 1.0000 | ORAL_TABLET | Freq: Four times a day (QID) | ORAL | 0 refills | Status: DC | PRN

## 2017-06-21 NOTE — Patient Instructions (Signed)
Soak finger in Epsom salt or salt water for 15 minutes with warm water daily and 1 drop of discharge  Work on finger flexion/bending the finger

## 2017-06-21 NOTE — Progress Notes (Signed)
Progress Note   Patient ID: Justin [name], male   DOB: [birth year], 62 y.o.   MRN: [id number]  Chief Complaint  Patient presents with  . Follow-up    Right Ring Finger, Suture removal DOI 06/06/17     HPI Routine follow-up status post open nondisplaced fracture distal phalanx right ring finger suture removal.  No signs of infection patient complains of pain after the sutures were removed said he was doing pretty good until that happened  ROS Current Meds  Medication Sig  . aspirin 81 MG chewable tablet Chew 81 mg by mouth daily.   . Cholecalciferol (VITAMIN D3 PO) Take by mouth.  . clonazePAM (KLONOPIN) 0.5 MG tablet Take 0.5 mg by mouth at bedtime. *May take one tablet three times daily as needed for anxiety*  . DULoxetine (CYMBALTA) 60 MG capsule Take 60 mg by mouth every morning.   . Fenofibric Acid 105 MG TABS Take 105 mg by mouth at bedtime.   . gabapentin (NEURONTIN) 600 MG tablet Take 600 mg by mouth 3 (three) times daily.  Marland Kitchen levothyroxine (SYNTHROID, LEVOTHROID) 112 MCG tablet Take 112 mcg by mouth every morning.   . Multiple Vitamins-Minerals (MULTIVITAMIN ADULT PO) Take 1 tablet by mouth daily.  Marland Kitchen oxyCODONE-acetaminophen (PERCOCET/ROXICET) 5-325 MG tablet Take 1 tablet by mouth every 4 (four) hours as needed for severe pain.  . Pancrelipase, Lip-Prot-Amyl, (CREON) 6000 units CPEP Take 2-4 capsules by mouth 5 (five) times daily. Patient takes 4 capsules by mouth three times a day with meals and 2 capsules by mouth twice a day with snacks  . pantoprazole (PROTONIX) 40 MG tablet Take 1 tablet (40 mg total) by mouth daily.  . polyethylene glycol (MIRALAX / GLYCOLAX) packet Take 17 g by mouth daily as needed for mild constipation.   . tamsulosin (FLOMAX) 0.4 MG CAPS capsule Take 0.4 mg by mouth 2 (two) times daily.   Marland Kitchen tiZANidine (ZANAFLEX) 4 MG tablet Take 4 mg by mouth 3 (three) times daily.     Allergies  Allergen Reactions  . Amoxicillin Hives and Swelling  . Dilaudid  [Hydromorphone Hcl] Swelling  . Hydromorphone Swelling     BP 105/63   Pulse 67   Ht 5\' 7"  (1.702 m)   Wt 161 lb (73 kg)   BMI 25.22 kg/m   Physical Exam Suture line looks good finger looks like it will come around and he has a little bit of stiffness still in the DIP joint nail still intact Medical decision-making Encounter Diagnosis  Name Primary?  . Open nondisplaced fracture of distal phalanx of right ring finger with routine healing, subsequent encounter 06/06/17 Yes   Refilled medication decreasing opioid load  Return in 8 weeks start soaking 15 minutes with salt water and disc detergent work on flexion   Meds ordered this encounter  Medications  . HYDROcodone-acetaminophen (NORCO/VICODIN) 5-325 MG tablet    Sig: Take 1 tablet by mouth every 6 (six) hours as needed for moderate pain.    Dispense:  30 tablet    Refill:  0     06/08/17, MD 06/21/2017 3:21 PM

## 2017-08-01 ENCOUNTER — Ambulatory Visit: Payer: Medicare Other | Admitting: Orthopedic Surgery

## 2017-08-03 ENCOUNTER — Encounter: Payer: Self-pay | Admitting: Orthopedic Surgery

## 2017-08-03 ENCOUNTER — Ambulatory Visit (INDEPENDENT_AMBULATORY_CARE_PROVIDER_SITE_OTHER): Payer: Medicare Other | Admitting: Orthopedic Surgery

## 2017-08-03 VITALS — BP 120/73 | HR 89 | Ht 66.0 in | Wt 156.0 lb

## 2017-08-03 DIAGNOSIS — S62664D Nondisplaced fracture of distal phalanx of right ring finger, subsequent encounter for fracture with routine healing: Secondary | ICD-10-CM

## 2017-08-03 NOTE — Progress Notes (Signed)
Progress Note   Patient ID: Justin [name], male   DOB: Dec 25, 1955, 62 y.o.   MRN: [id number]  Chief Complaint  Patient presents with  . Finger Injury    06/06/17 right ring finger open fracture      Medical decision-making Encounter Diagnosis  Name Primary?  . Open nondisplaced fracture of distal phalanx of right ring finger with routine healing, subsequent encounter 06/06/17 Yes     PLAN: Resume all normal activities  Soft medium hard toothbrush to desensitize the tip of the finger 2 weeks for each for total of 6 weeks follow-up as needed    No orders of the defined types were placed in this encounter.        Chief Complaint  Patient presents with  . Finger Injury    06/06/17 right ring finger open fracture     62 year old male 2 months after nondisplaced fracture distal phalanx ring finger doing well except the tip of the finger is sensitive    ROS Current Meds  Medication Sig  . aspirin 81 MG chewable tablet Chew 81 mg by mouth daily.   . Cholecalciferol (VITAMIN D3 PO) Take by mouth.  . clonazePAM (KLONOPIN) 0.5 MG tablet Take 0.5 mg by mouth at bedtime. *May take one tablet three times daily as needed for anxiety*  . DULoxetine (CYMBALTA) 60 MG capsule Take 60 mg by mouth every morning.   . Fenofibric Acid 105 MG TABS Take 105 mg by mouth at bedtime.   . gabapentin (NEURONTIN) 600 MG tablet Take 600 mg by mouth 3 (three) times daily.  Marland Kitchen HYDROcodone-acetaminophen (NORCO/VICODIN) 5-325 MG tablet Take 1 tablet by mouth every 6 (six) hours as needed for moderate pain.  Marland Kitchen ibuprofen (ADVIL,MOTRIN) 800 MG tablet   . levothyroxine (SYNTHROID, LEVOTHROID) 112 MCG tablet Take 112 mcg by mouth every morning.   . Multiple Vitamins-Minerals (MULTIVITAMIN ADULT PO) Take 1 tablet by mouth daily.  Marland Kitchen oxyCODONE-acetaminophen (PERCOCET/ROXICET) 5-325 MG tablet Take 1 tablet by mouth every 4 (four) hours as needed for severe pain.  . Pancrelipase, Lip-Prot-Amyl, (CREON) 6000  units CPEP Take 2-4 capsules by mouth 5 (five) times daily. Patient takes 4 capsules by mouth three times a day with meals and 2 capsules by mouth twice a day with snacks  . pantoprazole (PROTONIX) 40 MG tablet Take 1 tablet (40 mg total) by mouth daily.  . polyethylene glycol (MIRALAX / GLYCOLAX) packet Take 17 g by mouth daily as needed for mild constipation.   . tamsulosin (FLOMAX) 0.4 MG CAPS capsule Take 0.4 mg by mouth 2 (two) times daily.   Marland Kitchen tiZANidine (ZANAFLEX) 4 MG tablet Take 4 mg by mouth 3 (three) times daily.     Allergies  Allergen Reactions  . Amoxicillin Hives and Swelling  . Dilaudid [Hydromorphone Hcl] Swelling  . Hydromorphone Swelling     BP 120/73   Pulse 89   Ht 5\' 6"  (1.676 m)   Wt 156 lb (70.8 kg)   BMI 25.18 kg/m   Physical Exam  Musculoskeletal:       Arms:      Amy Littrell, RT 08/03/2017 10:31 AM

## 2017-11-10 ENCOUNTER — Other Ambulatory Visit: Payer: Self-pay

## 2017-11-10 ENCOUNTER — Encounter (HOSPITAL_COMMUNITY): Payer: Self-pay | Admitting: Anesthesiology

## 2017-11-10 ENCOUNTER — Emergency Department (HOSPITAL_COMMUNITY): Payer: Medicare Other | Admitting: Anesthesiology

## 2017-11-10 ENCOUNTER — Inpatient Hospital Stay (HOSPITAL_COMMUNITY)
Admission: EM | Admit: 2017-11-10 | Discharge: 2017-11-16 | DRG: 906 | Disposition: A | Discharge status: Home Health | Payer: Medicare Other | Attending: Internal Medicine | Admitting: Internal Medicine

## 2017-11-10 ENCOUNTER — Encounter (HOSPITAL_COMMUNITY)
Admission: EM | Disposition: A | Discharge status: Home Health | Payer: Self-pay | Source: Home / Self Care | Attending: Internal Medicine

## 2017-11-10 DIAGNOSIS — E039 Hypothyroidism, unspecified: Secondary | ICD-10-CM | POA: Diagnosis present

## 2017-11-10 DIAGNOSIS — F329 Major depressive disorder, single episode, unspecified: Secondary | ICD-10-CM | POA: Diagnosis present

## 2017-11-10 DIAGNOSIS — S68114A Complete traumatic metacarpophalangeal amputation of right ring finger, initial encounter: Secondary | ICD-10-CM | POA: Diagnosis present

## 2017-11-10 DIAGNOSIS — K1379 Other lesions of oral mucosa: Secondary | ICD-10-CM | POA: Diagnosis not present

## 2017-11-10 DIAGNOSIS — K831 Obstruction of bile duct: Secondary | ICD-10-CM | POA: Diagnosis present

## 2017-11-10 DIAGNOSIS — N400 Enlarged prostate without lower urinary tract symptoms: Secondary | ICD-10-CM | POA: Diagnosis present

## 2017-11-10 DIAGNOSIS — Z880 Allergy status to penicillin: Secondary | ICD-10-CM

## 2017-11-10 DIAGNOSIS — S61452A Open bite of left hand, initial encounter: Secondary | ICD-10-CM | POA: Diagnosis present

## 2017-11-10 DIAGNOSIS — S68119A Complete traumatic metacarpophalangeal amputation of unspecified finger, initial encounter: Secondary | ICD-10-CM | POA: Diagnosis present

## 2017-11-10 DIAGNOSIS — I10 Essential (primary) hypertension: Secondary | ICD-10-CM | POA: Diagnosis present

## 2017-11-10 DIAGNOSIS — S61451A Open bite of right hand, initial encounter: Secondary | ICD-10-CM | POA: Diagnosis present

## 2017-11-10 DIAGNOSIS — Z7952 Long term (current) use of systemic steroids: Secondary | ICD-10-CM

## 2017-11-10 DIAGNOSIS — G8929 Other chronic pain: Secondary | ICD-10-CM | POA: Diagnosis present

## 2017-11-10 DIAGNOSIS — K219 Gastro-esophageal reflux disease without esophagitis: Secondary | ICD-10-CM | POA: Diagnosis present

## 2017-11-10 DIAGNOSIS — Z9049 Acquired absence of other specified parts of digestive tract: Secondary | ICD-10-CM

## 2017-11-10 DIAGNOSIS — D72829 Elevated white blood cell count, unspecified: Secondary | ICD-10-CM | POA: Diagnosis present

## 2017-11-10 DIAGNOSIS — E7800 Pure hypercholesterolemia, unspecified: Secondary | ICD-10-CM | POA: Diagnosis present

## 2017-11-10 DIAGNOSIS — Z885 Allergy status to narcotic agent status: Secondary | ICD-10-CM | POA: Diagnosis not present

## 2017-11-10 DIAGNOSIS — Y92096 Garden or yard of other non-institutional residence as the place of occurrence of the external cause: Secondary | ICD-10-CM | POA: Diagnosis not present

## 2017-11-10 DIAGNOSIS — K5900 Constipation, unspecified: Secondary | ICD-10-CM | POA: Diagnosis not present

## 2017-11-10 DIAGNOSIS — S62611A Displaced fracture of proximal phalanx of left index finger, initial encounter for closed fracture: Secondary | ICD-10-CM | POA: Diagnosis present

## 2017-11-10 DIAGNOSIS — S61459A Open bite of unspecified hand, initial encounter: Secondary | ICD-10-CM | POA: Diagnosis not present

## 2017-11-10 DIAGNOSIS — M797 Fibromyalgia: Secondary | ICD-10-CM | POA: Diagnosis present

## 2017-11-10 DIAGNOSIS — Z87891 Personal history of nicotine dependence: Secondary | ICD-10-CM

## 2017-11-10 DIAGNOSIS — K861 Other chronic pancreatitis: Secondary | ICD-10-CM | POA: Diagnosis present

## 2017-11-10 DIAGNOSIS — I252 Old myocardial infarction: Secondary | ICD-10-CM

## 2017-11-10 DIAGNOSIS — K863 Pseudocyst of pancreas: Secondary | ICD-10-CM | POA: Diagnosis not present

## 2017-11-10 DIAGNOSIS — E876 Hypokalemia: Secondary | ICD-10-CM | POA: Diagnosis present

## 2017-11-10 DIAGNOSIS — R52 Pain, unspecified: Secondary | ICD-10-CM

## 2017-11-10 DIAGNOSIS — S91051A Open bite, right ankle, initial encounter: Secondary | ICD-10-CM | POA: Diagnosis present

## 2017-11-10 DIAGNOSIS — W19XXXA Unspecified fall, initial encounter: Secondary | ICD-10-CM

## 2017-11-10 DIAGNOSIS — J449 Chronic obstructive pulmonary disease, unspecified: Secondary | ICD-10-CM | POA: Diagnosis present

## 2017-11-10 DIAGNOSIS — Z79890 Hormone replacement therapy (postmenopausal): Secondary | ICD-10-CM

## 2017-11-10 DIAGNOSIS — Z7982 Long term (current) use of aspirin: Secondary | ICD-10-CM

## 2017-11-10 DIAGNOSIS — D696 Thrombocytopenia, unspecified: Secondary | ICD-10-CM | POA: Diagnosis present

## 2017-11-10 DIAGNOSIS — Q453 Other congenital malformations of pancreas and pancreatic duct: Secondary | ICD-10-CM

## 2017-11-10 DIAGNOSIS — M25551 Pain in right hip: Secondary | ICD-10-CM | POA: Diagnosis present

## 2017-11-10 DIAGNOSIS — S31159A Open bite of abdominal wall, unspecified quadrant without penetration into peritoneal cavity, initial encounter: Secondary | ICD-10-CM | POA: Diagnosis present

## 2017-11-10 DIAGNOSIS — I69351 Hemiplegia and hemiparesis following cerebral infarction affecting right dominant side: Secondary | ICD-10-CM

## 2017-11-10 DIAGNOSIS — Z808 Family history of malignant neoplasm of other organs or systems: Secondary | ICD-10-CM

## 2017-11-10 DIAGNOSIS — Z7951 Long term (current) use of inhaled steroids: Secondary | ICD-10-CM

## 2017-11-10 DIAGNOSIS — S61259A Open bite of unspecified finger without damage to nail, initial encounter: Secondary | ICD-10-CM

## 2017-11-10 DIAGNOSIS — Z791 Long term (current) use of non-steroidal anti-inflammatories (NSAID): Secondary | ICD-10-CM

## 2017-11-10 DIAGNOSIS — Z833 Family history of diabetes mellitus: Secondary | ICD-10-CM

## 2017-11-10 DIAGNOSIS — W540XXA Bitten by dog, initial encounter: Secondary | ICD-10-CM | POA: Diagnosis present

## 2017-11-10 DIAGNOSIS — D638 Anemia in other chronic diseases classified elsewhere: Secondary | ICD-10-CM | POA: Diagnosis present

## 2017-11-10 DIAGNOSIS — S62624B Displaced fracture of medial phalanx of right ring finger, initial encounter for open fracture: Secondary | ICD-10-CM

## 2017-11-10 DIAGNOSIS — Z79899 Other long term (current) drug therapy: Secondary | ICD-10-CM

## 2017-11-10 DIAGNOSIS — Z8249 Family history of ischemic heart disease and other diseases of the circulatory system: Secondary | ICD-10-CM

## 2017-11-10 HISTORY — PX: I & D EXTREMITY: SHX5045

## 2017-11-10 LAB — CBC WITH DIFFERENTIAL/PLATELET
ABS IMMATURE GRANULOCYTES: 0.1 10*3/uL (ref 0.0–0.1)
Basophils Absolute: 0 10*3/uL (ref 0.0–0.1)
Basophils Relative: 0 %
EOS ABS: 0 10*3/uL (ref 0.0–0.7)
Eosinophils Relative: 0 %
HEMATOCRIT: 32.6 % — AB (ref 39.0–52.0)
HEMOGLOBIN: 10.7 g/dL — AB (ref 13.0–17.0)
IMMATURE GRANULOCYTES: 0 %
LYMPHS ABS: 0.8 10*3/uL (ref 0.7–4.0)
LYMPHS PCT: 6 %
MCH: 28.7 pg (ref 26.0–34.0)
MCHC: 32.8 g/dL (ref 30.0–36.0)
MCV: 87.4 fL (ref 78.0–100.0)
MONOS PCT: 7 %
Monocytes Absolute: 0.9 10*3/uL (ref 0.1–1.0)
NEUTROS ABS: 11 10*3/uL — AB (ref 1.7–7.7)
Neutrophils Relative %: 87 %
Platelets: 168 10*3/uL (ref 150–400)
RBC: 3.73 MIL/uL — ABNORMAL LOW (ref 4.22–5.81)
RDW: 14.6 % (ref 11.5–15.5)
WBC: 12.7 10*3/uL — ABNORMAL HIGH (ref 4.0–10.5)

## 2017-11-10 LAB — COMPREHENSIVE METABOLIC PANEL
ALBUMIN: 3.2 g/dL — AB (ref 3.5–5.0)
ALK PHOS: 52 U/L (ref 38–126)
ALT: 17 U/L (ref 0–44)
AST: 16 U/L (ref 15–41)
Anion gap: 8 (ref 5–15)
BILIRUBIN TOTAL: 0.4 mg/dL (ref 0.3–1.2)
BUN: 16 mg/dL (ref 8–23)
CO2: 25 mmol/L (ref 22–32)
CREATININE: 1.17 mg/dL (ref 0.61–1.24)
Calcium: 8.5 mg/dL — ABNORMAL LOW (ref 8.9–10.3)
Chloride: 108 mmol/L (ref 98–111)
GFR calc Af Amer: >60 mL/min (ref >60)
Glucose, Bld: 189 mg/dL — ABNORMAL HIGH (ref 70–99)
Potassium: 3.2 mmol/L — ABNORMAL LOW (ref 3.5–5.1)
Sodium: 141 mmol/L (ref 135–145)
TOTAL PROTEIN: 5.6 g/dL — AB (ref 6.5–8.1)

## 2017-11-10 LAB — I-STAT CHEM 8, ED
BUN: 18 mg/dL (ref 8–23)
CALCIUM ION: 1.15 mmol/L (ref 1.15–1.40)
CHLORIDE: 106 mmol/L (ref 98–111)
Creatinine, Ser: 1.2 mg/dL (ref 0.61–1.24)
GLUCOSE: 185 mg/dL — AB (ref 70–99)
HCT: 31 % — ABNORMAL LOW (ref 39.0–52.0)
Hemoglobin: 10.5 g/dL — ABNORMAL LOW (ref 13.0–17.0)
Potassium: 3.3 mmol/L — ABNORMAL LOW (ref 3.5–5.1)
SODIUM: 142 mmol/L (ref 135–145)
TCO2: 26 mmol/L (ref 22–32)

## 2017-11-10 SURGERY — IRRIGATION AND DEBRIDEMENT EXTREMITY
Anesthesia: General | Laterality: Bilateral

## 2017-11-10 MED ORDER — LIDOCAINE HCL (CARDIAC) PF 100 MG/5ML IV SOSY
PREFILLED_SYRINGE | INTRAVENOUS | Status: DC | PRN
  Administered 2017-11-10: 100 mg via INTRATRACHEAL

## 2017-11-10 MED ORDER — ONDANSETRON HCL 4 MG/2ML IJ SOLN
4.0000 mg | Freq: Once | INTRAMUSCULAR | Status: AC
  Administered 2017-11-10: 4 mg via INTRAVENOUS
  Filled 2017-11-10: qty 2

## 2017-11-10 MED ORDER — SODIUM CHLORIDE 0.9 % IV SOLN
INTRAVENOUS | Status: DC | PRN
  Administered 2017-11-10: 40 ug/min via INTRAVENOUS

## 2017-11-10 MED ORDER — MORPHINE SULFATE (PF) 4 MG/ML IV SOLN
4.0000 mg | Freq: Once | INTRAVENOUS | Status: AC
  Administered 2017-11-10: 4 mg via INTRAVENOUS
  Filled 2017-11-10: qty 1

## 2017-11-10 MED ORDER — LACTATED RINGERS IV SOLN
INTRAVENOUS | Status: DC | PRN
  Administered 2017-11-10 (×2): via INTRAVENOUS

## 2017-11-10 MED ORDER — CIPROFLOXACIN IN D5W 400 MG/200ML IV SOLN
400.0000 mg | Freq: Once | INTRAVENOUS | Status: AC
  Administered 2017-11-10: 400 mg via INTRAVENOUS
  Filled 2017-11-10: qty 200

## 2017-11-10 MED ORDER — SUCCINYLCHOLINE CHLORIDE 20 MG/ML IJ SOLN
INTRAMUSCULAR | Status: DC | PRN
  Administered 2017-11-10: 120 mg via INTRAVENOUS

## 2017-11-10 MED ORDER — FENTANYL CITRATE (PF) 100 MCG/2ML IJ SOLN
50.0000 ug | Freq: Once | INTRAMUSCULAR | Status: AC
  Administered 2017-11-10: 50 ug via INTRAVENOUS
  Filled 2017-11-10: qty 2

## 2017-11-10 MED ORDER — PROPOFOL 10 MG/ML IV BOLUS
INTRAVENOUS | Status: AC
  Filled 2017-11-10: qty 20

## 2017-11-10 MED ORDER — FENTANYL CITRATE (PF) 250 MCG/5ML IJ SOLN
INTRAMUSCULAR | Status: DC | PRN
  Administered 2017-11-10 (×2): 50 ug via INTRAVENOUS

## 2017-11-10 MED ORDER — EPHEDRINE SULFATE 50 MG/ML IJ SOLN
INTRAMUSCULAR | Status: DC | PRN
  Administered 2017-11-10 (×3): 10 mg via INTRAVENOUS

## 2017-11-10 MED ORDER — PROPOFOL 10 MG/ML IV BOLUS
INTRAVENOUS | Status: DC | PRN
  Administered 2017-11-10: 160 mg via INTRAVENOUS

## 2017-11-10 MED ORDER — MIDAZOLAM HCL 5 MG/5ML IJ SOLN
INTRAMUSCULAR | Status: DC | PRN
  Administered 2017-11-10: 2 mg via INTRAVENOUS

## 2017-11-10 MED ORDER — FENTANYL CITRATE (PF) 250 MCG/5ML IJ SOLN
INTRAMUSCULAR | Status: AC
  Filled 2017-11-10: qty 5

## 2017-11-10 MED ORDER — MIDAZOLAM HCL 2 MG/2ML IJ SOLN
INTRAMUSCULAR | Status: AC
  Filled 2017-11-10: qty 2

## 2017-11-10 MED ORDER — CLINDAMYCIN PHOSPHATE 600 MG/50ML IV SOLN
600.0000 mg | Freq: Once | INTRAVENOUS | Status: AC
  Administered 2017-11-10: 600 mg via INTRAVENOUS
  Filled 2017-11-10: qty 50

## 2017-11-10 MED ORDER — ONDANSETRON HCL 4 MG/2ML IJ SOLN
INTRAMUSCULAR | Status: DC | PRN
  Administered 2017-11-10: 4 mg via INTRAVENOUS

## 2017-11-10 MED ORDER — SODIUM CHLORIDE 0.9 % IV BOLUS
1000.0000 mL | Freq: Once | INTRAVENOUS | Status: AC
  Administered 2017-11-10: 1000 mL via INTRAVENOUS

## 2017-11-10 SURGICAL SUPPLY — 54 items
BANDAGE ACE 4X5 VEL STRL LF (GAUZE/BANDAGES/DRESSINGS) ×3 IMPLANT
BANDAGE ELASTIC 4 VELCRO ST LF (GAUZE/BANDAGES/DRESSINGS) ×9 IMPLANT
BNDG CONFORM 2 STRL LF (GAUZE/BANDAGES/DRESSINGS) IMPLANT
BNDG GAUZE ELAST 4 BULKY (GAUZE/BANDAGES/DRESSINGS) ×6 IMPLANT
CORD BIPOLAR FORCEPS 12FT (ELECTRODE) ×3 IMPLANT
CORDS BIPOLAR (ELECTRODE) ×3 IMPLANT
COVER SURGICAL LIGHT HANDLE (MISCELLANEOUS) ×3 IMPLANT
CUFF TOURNIQUET SINGLE 18IN (TOURNIQUET CUFF) ×3 IMPLANT
CUFF TOURNIQUET SINGLE 24IN (TOURNIQUET CUFF) IMPLANT
DRAPE EXTREMITY T 121X128X90 (DRAPE) ×6 IMPLANT
DRAPE HALF SHEET 40X57 (DRAPES) ×3 IMPLANT
DRAPE ORTHO SPLIT 77X108 STRL (DRAPES) ×2
DRAPE SURG ORHT 6 SPLT 77X108 (DRAPES) ×1 IMPLANT
DRSG ADAPTIC 3X8 NADH LF (GAUZE/BANDAGES/DRESSINGS) ×3 IMPLANT
GAUZE PACKING IODOFORM 1/4X15 (GAUZE/BANDAGES/DRESSINGS) ×3 IMPLANT
GAUZE SPONGE 4X4 12PLY STRL (GAUZE/BANDAGES/DRESSINGS) ×3 IMPLANT
GAUZE SPONGE 4X4 12PLY STRL LF (GAUZE/BANDAGES/DRESSINGS) ×9 IMPLANT
GAUZE XEROFORM 1X8 LF (GAUZE/BANDAGES/DRESSINGS) ×3 IMPLANT
GAUZE XEROFORM 5X9 LF (GAUZE/BANDAGES/DRESSINGS) ×3 IMPLANT
GLOVE BIOGEL M 8.0 STRL (GLOVE) ×3 IMPLANT
GLOVE SS BIOGEL STRL SZ 8 (GLOVE) ×2 IMPLANT
GLOVE SUPERSENSE BIOGEL SZ 8 (GLOVE) ×4
GOWN STRL REUS W/ TWL LRG LVL3 (GOWN DISPOSABLE) ×1 IMPLANT
GOWN STRL REUS W/ TWL XL LVL3 (GOWN DISPOSABLE) ×2 IMPLANT
GOWN STRL REUS W/TWL LRG LVL3 (GOWN DISPOSABLE) ×2
GOWN STRL REUS W/TWL XL LVL3 (GOWN DISPOSABLE) ×4
KIT BASIN OR (CUSTOM PROCEDURE TRAY) ×3 IMPLANT
KIT TURNOVER KIT B (KITS) ×3 IMPLANT
LOOP VESSEL MAXI BLUE (MISCELLANEOUS) ×3 IMPLANT
MANIFOLD NEPTUNE II (INSTRUMENTS) ×3 IMPLANT
NS IRRIG 1000ML POUR BTL (IV SOLUTION) ×3 IMPLANT
PACK GENERAL/GYN (CUSTOM PROCEDURE TRAY) ×3 IMPLANT
PAD ARMBOARD 7.5X6 YLW CONV (MISCELLANEOUS) ×3 IMPLANT
PAD CAST 4YDX4 CTTN HI CHSV (CAST SUPPLIES) ×1 IMPLANT
PADDING CAST ABS 4INX4YD NS (CAST SUPPLIES) ×2
PADDING CAST ABS COTTON 4X4 ST (CAST SUPPLIES) ×1 IMPLANT
PADDING CAST COTTON 4X4 STRL (CAST SUPPLIES) ×2
SCRUB BETADINE 4OZ XXX (MISCELLANEOUS) ×3 IMPLANT
SET CYSTO W/LG BORE CLAMP LF (SET/KITS/TRAYS/PACK) ×3 IMPLANT
SOL PREP POV-IOD 4OZ 10% (MISCELLANEOUS) ×6 IMPLANT
SPLINT FIBERGLASS 3X12 (CAST SUPPLIES) ×3 IMPLANT
STOCKINETTE 4X48 STRL (DRAPES) ×6 IMPLANT
STOCKINETTE 6  STRL (DRAPES) ×2
STOCKINETTE 6 STRL (DRAPES) ×1 IMPLANT
SUT CHROMIC 5 0 P 3 (SUTURE) ×3 IMPLANT
SUT PROLENE 4 0 PS 2 18 (SUTURE) ×9 IMPLANT
SUT SILK 4 0 PS 2 (SUTURE) IMPLANT
SYR CONTROL 10ML LL (SYRINGE) IMPLANT
TOWEL OR 17X24 6PK STRL BLUE (TOWEL DISPOSABLE) ×3 IMPLANT
TOWEL OR 17X26 10 PK STRL BLUE (TOWEL DISPOSABLE) ×3 IMPLANT
TUBE CONNECTING 12'X1/4 (SUCTIONS) ×1
TUBE CONNECTING 12X1/4 (SUCTIONS) ×2 IMPLANT
WATER STERILE IRR 1000ML POUR (IV SOLUTION) ×3 IMPLANT
YANKAUER SUCT BULB TIP NO VENT (SUCTIONS) ×3 IMPLANT

## 2017-11-10 NOTE — Consult Note (Signed)
Reason for Consult:pit bull mauling Referring Physician: ER staff  Justin [name] is an 62 y.o. male.  HPI: patient is a 62 year old disabled gentleman who was mauled by a pit bull today. He has a history of a stroke in his past. He denies diabetes. He states that his dog was provoked hand subsequently injured him. I reviewed this with he and his family member. At present time he has an amputation ring finger with active bleeding and significant disarray of the right hand. The left hand has large amounts of grass and multiple puncture wounds throughout-to numerous to count. He also has injury to his right foot. He has abrasion to the abdominal region. He denies neck pain chest pain or pelvic pain. I reviewed this with him at length. He and I have discussed all issues in great detail.  He does have a history of a dysfunctional pancreas with frequent pancreatitis according to his report. He and I discussed the findings. 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    Past Medical History:  Diagnosis Date  . Alcoholism (HCC)   . Asthma   . Back pain   . Fibromyalgia   . GERD (gastroesophageal reflux disease)   . High cholesterol   . Hypertension   . Hypothyroidism   . Myocardial infarct [facility])    cardiologist in town  . Pancreatitis    with pseudocysts  . Pneumonia   . Stroke [facility])    right leg weakness    Past Surgical History:  Procedure Laterality Date  . BACK SURGERY    . CHOLECYSTECTOMY  2009  . FINGER SURGERY    . PANCREATIC PSEUDOCYST DRAINAGE  2014    Family History  Problem Relation Age of Onset  . Diabetes Mother   . Heart attack Mother   . Diabetes Brother   . Heart attack Brother   . Brain cancer Brother   . Diabetes Brother   . Cancer Father        ? stomach  . Heart attack Father   . Pancreatitis Neg Hx     Social History:  reports that he has quit smoking. His smoking use included cigarettes. He smoked 0.40 packs per day. He has never used smokeless  tobacco. He reports that he has current or past drug history. Drug: Marijuana. He reports that he does not drink alcohol.  Allergies:  Allergies  Allergen Reactions  . Amoxicillin Hives and Swelling  . Dilaudid [Hydromorphone Hcl] Swelling  . Hydromorphone Swelling    Medications: I have reviewed the patient's current medications.  No results found for this or any previous visit (from the past 48 hour(s)).  No results found.  ROS Blood pressure 134/75, pulse (!) 104, temperature 99.3 F (37.4 C), temperature source Oral, resp. rate 17, height 5\' 7"  (1.702 m), weight 70.8 kg, SpO2 100 %. Physical Exam Bilateral pit bull bites multiple in nature about left hand with exposed lacerations and dirty contamination including grass debris etc. Is difficult to get any form of a meaningful nerve vascular exam. This obviously need urgent irrigation debridement and repair.  The patient and I discussed the other issue regarding his right hand where he has an amputation to the ring finger which will need to have a revision amputation and closure with irrigation debridement. We'll plan for a right foot irrigation debridement due to multiple abrasions.  I reviewed this issue with him at length and the findings.  His elbow and upper arm examination appears  to be stable. There is no evidence of dystrophic reaction or compartment syndrome at present time.  He'll need hospitalization.   Assessment/Plan: Patient and I discussed all issues. He has a right hand injury and right ring finger amputation. He will need to have the ring finger irrigated debrided and amputation completed. He will need to have irrigation debridement and repair of structures as necessary about the right hand as well as the left hand which has significant bites and lacerations as well as dirty contamination. We will also perform irrigation and debridement of his foot. I discussed with him the relevant issues findings concerns and  other aspects of his care germane to his predicament. With this in mind we will proceed accordingly. I discussed with him all issues plans concerns.  I would request hospitalist admission given the patient's multiple medical issues.  We will start him on Cipro 400 mg IV twice a day and clindamycin 600 mg every 8 hours due to the fact that he has a penicillin allergy. I discussed with him relevant issues do's and don'ts. Unfortunately there is a hyperdense the towards infection with animal bites and he understands this. We'll do everything in our power to try to give him a functional extremity however I do feel he does have some degree of permanency to this injury of course.  Dionne Ano Shafin Pollio III 11/10/2017, 8:22 PM

## 2017-11-10 NOTE — Anesthesia Preprocedure Evaluation (Signed)
Anesthesia Evaluation  Patient identified by MRN, date of birth, ID band Patient awake    Reviewed: Allergy & Precautions, NPO status , Patient's Chart, lab work & pertinent test results  Airway Mallampati: II  TM Distance: >3 FB Neck ROM: Full    Dental  (+) Teeth Intact, Dental Advisory Given   Pulmonary asthma , former smoker,    Pulmonary exam normal breath sounds clear to auscultation       Cardiovascular hypertension, + CAD and + Past MI  Normal cardiovascular exam Rhythm:Regular Rate:Normal     Neuro/Psych PSYCHIATRIC DISORDERS Depression CVA, Residual Symptoms    GI/Hepatic Neg liver ROS, GERD  ,  Endo/Other  Hypothyroidism   Renal/GU negative Renal ROS     Musculoskeletal  (+) Fibromyalgia -  Abdominal   Peds  Hematology  (+) Blood dyscrasia, anemia ,   Anesthesia Other Findings Day of surgery medications reviewed with the patient.  Reproductive/Obstetrics                             Anesthesia Physical Anesthesia Plan  ASA: III and emergent  Anesthesia Plan: General   Post-op Pain Management:    Induction: Intravenous  PONV Risk Score and Plan: 3 and Midazolam, Dexamethasone and Ondansetron  Airway Management Planned: Oral ETT  Additional Equipment:   Intra-op Plan:   Post-operative Plan: Extubation in OR  Informed Consent: I have reviewed the patients History and Physical, chart, labs and discussed the procedure including the risks, benefits and alternatives for the proposed anesthesia with the patient or authorized representative who has indicated his/her understanding and acceptance.   Dental advisory given  Plan Discussed with: CRNA  Anesthesia Plan Comments:         Anesthesia Quick Evaluation

## 2017-11-10 NOTE — Anesthesia Procedure Notes (Signed)
Procedure Name: Intubation Date/Time: 11/10/2017 10:07 PM Performed by: Claudina Lick, CRNA Pre-anesthesia Checklist: Patient identified, Emergency Drugs available, Suction available, Patient being monitored and Timeout performed Patient Re-evaluated:Patient Re-evaluated prior to induction Oxygen Delivery Method: Circle system utilized Preoxygenation: Pre-oxygenation with 100% oxygen Induction Type: IV induction Ventilation: Mask ventilation without difficulty Laryngoscope Size: Miller and 2 Grade View: Grade I Tube type: Oral Tube size: 7.5 mm Number of attempts: 1 Airway Equipment and Method: Stylet Placement Confirmation: ETT inserted through vocal cords under direct vision,  positive ETCO2 and breath sounds checked- equal and bilateral Secured at: 22 cm Tube secured with: Tape Dental Injury: Teeth and Oropharynx as per pre-operative assessment

## 2017-11-10 NOTE — ED Provider Notes (Signed)
[facility] EMERGENCY DEPARTMENT Provider Note   CSN: [id number] Arrival date & time: 11/10/17  1934     History   Chief Complaint Chief Complaint  Patient presents with  . Animal Bite  . Finger Injury    HPI HUXTON GLAUS is a 62 y.o. male who has an extensive past medical history that includes chronic pancreatitis, necrotizing pancreatitis, pancytopenia history of alcohol abuse and he has been sober for the past 6 years.  Patient presents to the emergency department via EMS for dog bite and finger amputation.  Patient's pitbull who is up-to-date on his immunizations got into a fight with a dog that came into their yard.  The patient tried to break it up and was attacked by the pimple bitten on the chest, right foot and bilateral hands.  The dog was put down at the scene to stop the attack.  [name] right middle finger was bitten off just above the PIP.  He is right-hand dominant.  His last tetanus vaccination was on 04/20/2017.  HPI  Past Medical History:  Diagnosis Date  . Alcoholism (HCC)   . Asthma   . Back pain   . Fibromyalgia   . GERD (gastroesophageal reflux disease)   . High cholesterol   . Hypertension   . Hypothyroidism   . Myocardial infarct [facility])    cardiologist in town  . Pancreatitis    with pseudocysts  . Pneumonia   . Stroke [facility])    right leg weakness    Patient Active Problem List   Diagnosis Date Noted  . Open nondisplaced fracture of distal phalanx of right ring finger 06/06/17 06/19/2017  . Other pancytopenia (HCC) 02/06/2013  . Pseudocyst, pancreas 02/03/2013  . Acute pancreatitis 06/28/2012  . Pancreatic abscess 06/28/2012  . Chest pain epsode 4/17 - relieved with NTG 06/28/2012  . SIRS (systemic inflammatory response syndrome) (HCC) 06/27/2012  . Acute respiratory failure (HCC) 06/26/2012  . Unspecified protein-calorie malnutrition (HCC) 06/26/2012  . Pancreatic pseudocyst from acute pancreatitis Dx 06/10/2012  06/11/2012  . 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 Elevated triglycerides with high cholesterol 06/10/2012  . Unspecified hypothyroidism 06/07/2012  . Depressive disorder, not elsewhere classified 06/07/2012  . CAD (coronary artery disease) of artery bypass graft 05/30/2012  . Hypokalemia 05/30/2012  . Abnormal LFTs (liver function tests) 05/30/2012  . Back pain   . Stroke (HCC)   . Fibromyalgia   . Myocardial infarct [facility])     Past Surgical History:  Procedure Laterality Date  . BACK SURGERY    . CHOLECYSTECTOMY  2009  . FINGER SURGERY    . PANCREATIC PSEUDOCYST DRAINAGE  2014        Home Medications    Prior to Admission medications   Medication Sig Start Date End Date Taking? Authorizing Provider  albuterol (PROAIR HFA) 108 (90 Base) MCG/ACT inhaler Inhale 1-2 puffs into the lungs every 6 (six) hours as needed for wheezing or shortness of breath.    [provider]  aspirin 81 MG chewable tablet Chew 81 mg by mouth daily.     [provider]  Cholecalciferol (VITAMIN D3 PO) Take by mouth.    [provider]  clonazePAM (KLONOPIN) 0.5 MG tablet Take 0.5 mg by mouth at bedtime. *May take one tablet three times daily as needed for anxiety* 12/14/14   [provider]  dicyclomine (BENTYL) 10 MG capsule Take 1 capsule (10 mg total) by mouth  every 6 (six) hours as needed for spasms. Patient not taking: Reported on 06/21/2017 04/19/17   Vassie Loll, MD  DULoxetine (CYMBALTA) 60 MG capsule Take 60 mg by mouth every morning.     [provider]  Fenofibric Acid 105 MG TABS Take 105 mg by mouth at bedtime.  12/28/14   [provider]  gabapentin (NEURONTIN) 600 MG tablet Take 600 mg by mouth 3 (three) times daily.    [provider]  HYDROcodone-acetaminophen (NORCO/VICODIN) 5-325 MG tablet Take 1 tablet by mouth every 6 (six) hours as needed for moderate pain. 06/21/17    Vickki Hearing, MD  ibuprofen (ADVIL,MOTRIN) 800 MG tablet  07/18/17   [provider]  levothyroxine (SYNTHROID, LEVOTHROID) 112 MCG tablet Take 112 mcg by mouth every morning.     [provider]  Multiple Vitamins-Minerals (MULTIVITAMIN ADULT PO) Take 1 tablet by mouth daily.    [provider]  ondansetron (ZOFRAN ODT) 8 MG disintegrating tablet Take 1 tablet (8 mg total) by mouth every 8 (eight) hours as needed for nausea or vomiting. Patient not taking: Reported on 06/21/2017 04/19/17   Vassie Loll, MD  oxyCODONE (OXY IR/ROXICODONE) 5 MG immediate release tablet Take 1 tablet (5 mg total) by mouth every 4 (four) hours as needed for moderate pain or severe pain. Patient not taking: Reported on 06/21/2017 04/19/17   Vassie Loll, MD  oxyCODONE-acetaminophen (PERCOCET/ROXICET) 5-325 MG tablet Take 1 tablet by mouth every 4 (four) hours as needed for severe pain. 06/06/17   Elson Areas, PA-C  Pancrelipase, Lip-Prot-Amyl, (CREON) 6000 units CPEP Take 2-4 capsules by mouth 5 (five) times daily. Patient takes 4 capsules by mouth three times a day with meals and 2 capsules by mouth twice a day with snacks    [provider]  pantoprazole (PROTONIX) 40 MG tablet Take 1 tablet (40 mg total) by mouth daily. 04/20/17   Vassie Loll, MD  polyethylene glycol [facility]) packet Take 17 g by mouth daily as needed for mild constipation.  07/19/12   [provider]  predniSONE (DELTASONE) 10 MG tablet Take 2 tablets by mouth daily. 04/11/17   [provider]  tamsulosin (FLOMAX) 0.4 MG CAPS capsule Take 0.4 mg by mouth 2 (two) times daily.  12/28/14   [provider]  tiZANidine (ZANAFLEX) 4 MG tablet Take 4 mg by mouth 3 (three) times daily.     [provider]    Family History Family History  Problem Relation Age of Onset  . Diabetes Mother   . Heart attack Mother   . Diabetes Brother   . Heart attack Brother   . Brain  cancer Brother   . Diabetes Brother   . Cancer Father        ? stomach  . Heart attack Father   . Pancreatitis Neg Hx     Social History Social History   Tobacco Use  . Smoking status: Former Smoker    Packs/day: 0.40    Types: Cigarettes  . Smokeless tobacco: Never Used  Substance Use Topics  . Alcohol use: No    Comment: previously used alcohol and quit 6 years ago  . Drug use: Yes    Types: Marijuana    Comment: daily     Allergies   Amoxicillin; Dilaudid [hydromorphone hcl]; and Hydromorphone   Review of Systems Review of Systems  Ten systems reviewed and are negative for acute change, except as noted in the HPI.  Physical Exam Updated Vital Signs BP 134/75   Pulse (!) 104   Temp 99.3 F (37.4 C) (Oral)   Resp 17   Ht 5\' 7"  (1.702 m)   Wt 70.8 kg   SpO2 100%   BMI 24.43 kg/m   Physical Exam  Constitutional: He is oriented to person, place, and time. He appears well-developed and well-nourished. No distress.  HENT:  Head: Normocephalic and atraumatic.  Eyes: Pupils are equal, round, and reactive to light. Conjunctivae and EOM are normal. No scleral icterus.  Neck: Normal range of motion. Neck supple.  Cardiovascular: Normal rate, regular rhythm and normal heart sounds.  Pulmonary/Chest: Effort normal and breath sounds normal. No respiratory distress.  Abdominal: Soft. There is no tenderness.  Musculoskeletal: He exhibits no edema.  Multiple bite wounds to the body including the right upper chest wall and abdomen, right foot.  The right hand also has multiple lacerations and to the right ring finger is amputated just above the PIP with arterial bleeding.  This was improved with pressure dressing.  Left hand has multiple open lacerations to the fingers.  Patient seems to have good strength and range of motion in each joint however this is difficult to evaluate considering the complexity and scale of his injuries.  His wounds are also contaminated with dirt  and grass.  Neurological: He is alert and oriented to person, place, and time.  Skin: Skin is warm and dry. He is not diaphoretic.  Psychiatric: His behavior is normal.  Nursing note and vitals reviewed.    ED Treatments / Results  Labs (all labs ordered are listed, but only abnormal results are displayed) Labs Reviewed - No data to display  EKG None  Radiology No results found.  Procedures .Critical Care Performed by: Arthor Captain, PA-C Authorized by: Arthor Captain, PA-C   Critical care provider statement:    Critical care time (minutes):  50   Critical care was necessary to treat or prevent imminent or life-threatening deterioration of the following conditions:  Trauma   Critical care was time spent personally by me on the following activities:  Development of treatment plan with patient or surrogate, discussions with consultants, evaluation of patient's response to treatment, examination of patient, ordering and performing treatments and interventions, ordering and review of laboratory studies, ordering and review of radiographic studies, pulse oximetry, re-evaluation of patient's condition and review of old charts   (including critical care time)  Medications Ordered in ED Medications  sodium chloride 0.9 % bolus 1,000 mL (has no administration in time range)  ondansetron (ZOFRAN) injection 4 mg (4 mg Intravenous Given 11/10/17 1937)  morphine 4 MG/ML injection 4 mg (4 mg Intravenous Given 11/10/17 1937)     Initial Impression / Assessment and Plan / ED Course  I have reviewed the triage vital signs and the nursing notes.  Pertinent labs & imaging results that were available during my care of the patient were reviewed by me and considered in my medical decision making (see chart for details).  Clinical Course as of Nov 11 2046  Sat Nov 10, 2017  1956 With dog bite to extremities and chest wall.  He is got an amputation of his right index finger distal to the PIP.   There is moderate amount of bleeding but nonpulsatile.  We have consulted hand surgery Dr. Amanda Pea and he is evaluated the patient and plans on taken to the operating room.   [MB]    Clinical Course User Index [MB] Charm Barges,  Kayleen Memos, MD    Since seen emergently by Dr. Amanda Pea.  He will be taken urgently to the operating room.  Given pain meds and IV antibiotics including Cipro and clindamycin as he has an allergy to amoxicillin.  Stable here in the ER with improved pain control.  Patient seen and shared visit with Dr. Charm Barges as well.  Patient will be entered by the hospitalist service.  His labs are currently pending.  Final Clinical Impressions(s) / ED Diagnoses   Final diagnoses:  None    ED Discharge Orders    None       Arthor Captain, PA-C 11/10/17 2208    Terrilee Files, MD 11/11/17 1014

## 2017-11-10 NOTE — ED Triage Notes (Signed)
Pt arrives via [employer], c/o for pt attacked by his dog.  Bilateral hand injuries with R ring finger amputation (hx of operation on same finger)  Superficial bite injuries to R foot and R flank  Pt received 100mg  fentnyl and 700 fluid

## 2017-11-10 NOTE — H&P (Signed)
Justin [name] EHO:[id number] DOB: [birth year] DOA: 11/10/2017     PCP: [name], MD   Outpatient Specialists:    Cheri Rous At [facility]    Patient arrived to ER on 11/10/17 at 1934  Patient coming from: home Lives   With family    Chief Complaint:  Chief Complaint  Patient presents with  . Animal Bite  . Finger Injury    HPI: [name] is a 62 y.o. 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 Family states that he was at home when neighbors dogs approached got in a fight with his dog he was trying to separate a fight and his dog attacked him biting him repeatedly on his hands feet side of abdomen. Denied alcohol was involved He was evaluated in the emergency department has been seen by Gramig who will take patient to OR Started on Cipro and clindamycin and given fentanyl and morphine for pain with good pain control Regarding pertinent Chronic problems: HTn now controled not on medications, patient's family denies coronary artery disease although it is listed in medical problems. Has been followed at [facility] for history of 20 pancreatitis with episode of necrotizing pancreatitis in the past coast was unknown but possibly alcohol induced although have not been drinking for the past 6 years status post endoscopic necrosectomy in 2014 with resolution of the pseudocyst necrotic area at the time. 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 CBC with Differential/Platelet  . Comprehensive metabolic panel  . Initiate Carrier Fluid Protocol  . Consult to hand surgery  . Consult for [facility] Admission  . I-stat chem 8, ed  . EKG  12-Lead     Following Medications were ordered in ER: Medications  ciprofloxacin (CIPRO) IVPB 400 mg (400 mg Intravenous New Bag/Given 11/10/17 2014)  fentaNYL (SUBLIMAZE) injection 50 mcg (has no administration in time range)  ondansetron (ZOFRAN) injection 4 mg (4 mg Intravenous Given 11/10/17 1937)  morphine 4 MG/ML injection 4 mg (4 mg Intravenous Given 11/10/17 1937)  sodium chloride 0.9 % bolus 1,000 mL (1,000 mLs Intravenous New Bag/Given 11/10/17 1951)  clindamycin (CLEOCIN) IVPB 600 mg (600 mg Intravenous New Bag/Given 11/10/17 2019)    Significant initial  Findings: Abnormal Labs Reviewed - No abnormal labs to display   Na 142 K 3.3  Cr    Stable,  Lab Results  Component Value Date   CREATININE 0.78 04/20/2017   CREATININE 0.67 04/19/2017   CREATININE 0.83 04/16/2017      WBC 12.7  HG/HCT  stable    Component Value Date/Time   HGB 11.6 (L) 04/20/2017 1011   HCT 34.7 (L) 04/20/2017 1011     Troponin (Point of Care Test) No results for input(s): TROPIPOC in the last 72 hours.     Lactic Acid, Venous    Component Value Date/Time   LATICACIDVEN 0.80 12/12/2015 1431      UA not ordered     ECG:  Personally reviewed by me showing: HR : 109 Rhythm:   Sinus tachycardia  Ischemic   no evidence of ischemic changes QTC 427    ED Triage Vitals  Enc  Vitals Group     BP 11/10/17 1927 134/75     Pulse Rate 11/10/17 1927 (!) 104     Resp 11/10/17 1927 17     Temp 11/10/17 1927 99.3 F (37.4 C)     Temp Source 11/10/17 1927 Oral     SpO2 11/10/17 1927 100 %     Weight 11/10/17 1943 156 lb (70.8 kg)     Height 11/10/17 1943 5\' 7"  (1.702 m)     Head Circumference --      Peak Flow --      Pain Score 11/10/17 1929 10     Pain Loc --      Pain Edu? --      Excl. in GC? --   TMAX(24)@       Latest  Blood pressure 134/75, pulse (!) 104, temperature 99.3 F (37.4 C), temperature source Oral, resp. rate 17, height 5\' 7"  (1.702 m), weight 70.8 kg, SpO2 100  %.   ER Provider Called:     Dr.Gremig They Recommend the antibiotics.  11/12/17 take patient to OR immediately postop admit to internal medicine Will see   in ER  Hospitalist was called for admission for dog bite extensive wounds   Review of Systems:    Pertinent positives include: Pain in multiple dog   bites otherwise unremarkable  Constitutional:  No weight loss, night sweats, Fevers, chills, fatigue, weight loss  HEENT:  No headaches, Difficulty swallowing,Tooth/dental problems,Sore throat,  No sneezing, itching, ear ache, nasal congestion, post nasal drip,  Cardio-vascular:  No chest pain, Orthopnea, PND, anasarca, dizziness, palpitations.no Bilateral lower extremity swelling  GI:  No heartburn, indigestion, abdominal pain, nausea, vomiting, diarrhea, change in bowel habits, loss of appetite, melena, blood in stool, hematemesis Resp:  no shortness of breath at rest. No dyspnea on exertion, No excess mucus, no productive cough, No non-productive cough, No coughing up of blood.No change in color of mucus.No wheezing. Skin:  no rash or lesions. No jaundice GU:  no dysuria, change in color of urine, no urgency or frequency. No straining to urinate.  No flank pain.  Musculoskeletal:  No joint pain or no joint swelling. No decreased range of motion. No back pain.  Psych:  No change in mood or affect. No depression or anxiety. No memory loss.  Neuro: no localizing neurological complaints, no tingling, no weakness, no double vision, no gait abnormality, no slurred speech, no confusion  All systems reviewed and apart from HOPI all are negative  Past Medical History:   Past Medical History:  Diagnosis Date  . Alcoholism (HCC)   . Asthma   . Back pain   . Fibromyalgia   . GERD (gastroesophageal reflux disease)   . High cholesterol   . Hypertension   . Hypothyroidism   . Myocardial infarct [facility])    cardiologist in town  . Pancreatitis    with pseudocysts  . Pneumonia   .  Stroke [facility])    right leg weakness      Past Surgical History:  Procedure Laterality Date  . BACK SURGERY    . CHOLECYSTECTOMY  2009  . FINGER SURGERY    . PANCREATIC PSEUDOCYST DRAINAGE  2014    Social History:  Ambulatory   independently      reports that he has quit smoking. His smoking use included cigarettes. He smoked 0.40 packs per day. He has never used smokeless tobacco. He reports that he has current or past drug history. Drug: Marijuana. He  reports that he does not drink alcohol.     Family History:   Family History  Problem Relation Age of Onset  . Diabetes Mother   . Heart attack Mother   . Diabetes Brother   . Heart attack Brother   . Brain cancer Brother   . Diabetes Brother   . Cancer Father        ? stomach  . Heart attack Father   . Pancreatitis Neg Hx     Allergies: Allergies  Allergen Reactions  . Amoxicillin Hives and Swelling  . Dilaudid [Hydromorphone Hcl] Swelling  . Hydromorphone Swelling     Prior to Admission medications   Medication Sig Start Date End Date Taking? Authorizing Provider  albuterol (PROAIR HFA) 108 (90 Base) MCG/ACT inhaler Inhale 1-2 puffs into the lungs every 6 (six) hours as needed for wheezing or shortness of breath.    [provider]  aspirin 81 MG chewable tablet Chew 81 mg by mouth daily.     [provider]  Cholecalciferol (VITAMIN D3 PO) Take by mouth.    [provider]  clonazePAM (KLONOPIN) 0.5 MG tablet Take 0.5 mg by mouth at bedtime. *May take one tablet three times daily as needed for anxiety* 12/14/14   [provider]  dicyclomine (BENTYL) 10 MG capsule Take 1 capsule (10 mg total) by mouth every 6 (six) hours as needed for spasms. Patient not taking: Reported on 06/21/2017 04/19/17   Vassie Loll, MD  DULoxetine (CYMBALTA) 60 MG capsule Take 60 mg by mouth every morning.     [provider]  Fenofibric Acid 105 MG TABS Take 105 mg by mouth at bedtime.   12/28/14   [provider]  gabapentin (NEURONTIN) 600 MG tablet Take 600 mg by mouth 3 (three) times daily.    [provider]  HYDROcodone-acetaminophen (NORCO/VICODIN) 5-325 MG tablet Take 1 tablet by mouth every 6 (six) hours as needed for moderate pain. 06/21/17   Vickki Hearing, MD  ibuprofen (ADVIL,MOTRIN) 800 MG tablet  07/18/17   [provider]  levothyroxine (SYNTHROID, LEVOTHROID) 112 MCG tablet Take 112 mcg by mouth every morning.     [provider]  Multiple Vitamins-Minerals (MULTIVITAMIN ADULT PO) Take 1 tablet by mouth daily.    [provider]  ondansetron (ZOFRAN ODT) 8 MG disintegrating tablet Take 1 tablet (8 mg total) by mouth every 8 (eight) hours as needed for nausea or vomiting. Patient not taking: Reported on 06/21/2017 04/19/17   Vassie Loll, MD  oxyCODONE (OXY IR/ROXICODONE) 5 MG immediate release tablet Take 1 tablet (5 mg total) by mouth every 4 (four) hours as needed for moderate pain or severe pain. Patient not taking: Reported on 06/21/2017 04/19/17   Vassie Loll, MD  oxyCODONE-acetaminophen (PERCOCET/ROXICET) 5-325 MG tablet Take 1 tablet by mouth every 4 (four) hours as needed for severe pain. 06/06/17   Elson Areas, PA-C  Pancrelipase, Lip-Prot-Amyl, (CREON) 6000 units CPEP Take 2-4 capsules by mouth 5 (five) times daily. Patient takes 4 capsules by mouth three times a day with meals and 2 capsules by mouth twice a day with snacks    [provider]  pantoprazole (PROTONIX) 40 MG tablet Take 1 tablet (40 mg total) by mouth daily. 04/20/17   Vassie Loll, MD  polyethylene glycol [facility] / Ethelene Hal) packet Take 17 g by mouth daily as needed for mild constipation.  07/19/12   [provider]  predniSONE (DELTASONE) 10 MG tablet Take 2 tablets  by mouth daily. 04/11/17   [provider]  tamsulosin (FLOMAX) 0.4 MG CAPS capsule Take 0.4 mg by mouth 2 (two) times daily.  12/28/14   [provider]  tiZANidine (ZANAFLEX) 4 MG tablet Take 4 mg by mouth 3 (three) times daily.     [provider]   Physical Exam: Blood pressure 134/75, pulse (!) 104, temperature 99.3 F (37.4 C), temperature source Oral, resp. rate 17, height 5\' 7"  (1.702 m), weight 70.8 kg, SpO2 100 %. 1. General:  in No Acute distress   acutely ill -appearing 2. Psychological: Alert and   Oriented 3. Head/ENT:     Dry Mucous Membranes                          Head Non traumatic, neck supple                           Poor Dentition 4. SKIN:   decreased Skin turgor,  Skin multiple dog bites noted contaminated with grass right hand in bandages severed finger at bedside and ice bag             5. Heart: Regular rate and rhythm no Murmur, no Rub or gallop 6. Lungs: Clear to auscultation bilaterally, no wheezes or crackles   7. Abdomen: Soft, non-tender, Non distended     bowel sounds present 8. Lower extremities: no clubbing, cyanosis, or  edema 9. Neurologically Grossly intact, moving all 4 extremities equally   10. MSK: Normal range of motion demented but extensive bites and injuries   LABS:    No results for input(s): WBC, NEUTROABS, HGB, HCT, MCV, PLT in the last 168 hours. Basic Metabolic Panel: No results for input(s): NA, K, CL, CO2, GLUCOSE, BUN, CREATININE, CALCIUM, MG, PHOS in the last 168 hours.    No results for input(s): AST, ALT, ALKPHOS, BILITOT, PROT, ALBUMIN in the last 168 hours. No results for input(s): LIPASE, AMYLASE in the last 168 hours. No results for input(s): AMMONIA in the last 168 hours.    HbA1C: No results for input(s): HGBA1C in the last 72 hours. CBG: No results for input(s): GLUCAP in the last 168 hours.    Urine analysis:    Component Value Date/Time   COLORURINE YELLOW 04/13/2017 0834   APPEARANCEUR CLEAR 04/13/2017 0834   LABSPEC 1.020 04/13/2017 0834   PHURINE 5.0 04/13/2017 0834   GLUCOSEU NEGATIVE 04/13/2017 0834   HGBUR NEGATIVE  04/13/2017 0834   BILIRUBINUR NEGATIVE 04/13/2017 0834   KETONESUR NEGATIVE 04/13/2017 0834   PROTEINUR NEGATIVE 04/13/2017 0834   UROBILINOGEN 0.2 06/22/2013 0333   NITRITE NEGATIVE 04/13/2017 0834   LEUKOCYTESUR NEGATIVE 04/13/2017 0834      Cultures:    Component Value Date/Time   SDES ABSCESS PANCREATIC PSEUDOCYST ASP 06/27/2012 0958   SDES ABSCESS PANCREATIC PSEUDOCYST ASP 06/27/2012 0958   SPECREQUEST Normal 06/27/2012 0958   SPECREQUEST Normal 06/27/2012 0958   CULT  06/27/2012 0958    MULTIPLE ORGANISMS PRESENT, NONE PREDOMINANT NO STAPHYLOCOCCUS AUREUS ISOLATED NO GROUP A STREP (S.PYOGENES) ISOLATED Note: NO PATHOGENS ISOLATED   CULT NO ANAEROBES ISOLATED 06/27/2012 0958   REPTSTATUS 06/30/2012 FINAL 06/27/2012 0958   REPTSTATUS 07/02/2012 FINAL 06/27/2012 0958     Radiological Exams on Admission: No results found.  Chart has been reviewed    Assessment/Plan  62 y.o. 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 Dog bite of multiple sites appreciate surgical intervention consultation.  Continue Cipro and clindamycin IV pain management IV while n.p.o. and transition to p.o. when able to tolerate . Hypokalemia we will replace . Pseudocyst, pancreas chronic currently stable . Hypothyroidism continue Synthroid and check TSH  . Leukocytosis in the setting of multiple injuries continue to monitor rehydrate and follow . Common bile duct (CBD) ?stricture once able to tolerate p.o. we will restart home medications currently stable  Other plan as per orders.  DVT prophylaxis:  SCD     Code Status:  FULL CODE as per family   I had personally discussed CODE STATUS with patient and family     Family Communication:   Family   at  Bedside  plan of care was discussed with GrandSon,  Wife,    Disposition Plan:     To home once workup is complete and patient is stable may require placement for rehabilitation given extensive wounds                    Would benefit from PT/OT eval prior to DC  Ordered                                      Consults called: Dr. Amanda Pea  Admission status:   inpatient     Expect 2 midnight stay secondary to severity of patient's current illness including extensive trauma high risk of infection due to multiple open wounds and need for IV pain medications  I expect  patient to be hospitalized for 2 midnights requiring inpatient medical care. Patient is at high risk for adverse outcome (such as loss of life or disability) if not treated. Indication for inpatient stay as follows:  Severe pain managed with maximal medical therapy,    Need for IV antibiotics need for surgical intervention      Level of care    medical floor           Therisa Doyne 11/10/2017, 11:25 PM    [facility]  Pager [phone]   after 2 AM please page floor coverage PA If 7AM-7PM, please contact the day team taking care of the patient  [email or web address]  Password [email or web address]

## 2017-11-11 ENCOUNTER — Inpatient Hospital Stay (HOSPITAL_COMMUNITY): Payer: Medicare Other

## 2017-11-11 DIAGNOSIS — S61459A Open bite of unspecified hand, initial encounter: Secondary | ICD-10-CM

## 2017-11-11 DIAGNOSIS — S61259A Open bite of unspecified finger without damage to nail, initial encounter: Secondary | ICD-10-CM

## 2017-11-11 DIAGNOSIS — W540XXA Bitten by dog, initial encounter: Secondary | ICD-10-CM

## 2017-11-11 LAB — CBC
HCT: 30.6 % — ABNORMAL LOW (ref 39.0–52.0)
Hemoglobin: 9.9 g/dL — ABNORMAL LOW (ref 13.0–17.0)
MCH: 28.4 pg (ref 26.0–34.0)
MCHC: 32.4 g/dL (ref 30.0–36.0)
MCV: 87.9 fL (ref 78.0–100.0)
PLATELETS: 169 10*3/uL (ref 150–400)
RBC: 3.48 MIL/uL — AB (ref 4.22–5.81)
RDW: 14.6 % (ref 11.5–15.5)
WBC: 10.6 10*3/uL — AB (ref 4.0–10.5)

## 2017-11-11 LAB — COMPREHENSIVE METABOLIC PANEL
ALBUMIN: 3.1 g/dL — AB (ref 3.5–5.0)
ALT: 79 U/L — AB (ref 0–44)
AST: 142 U/L — AB (ref 15–41)
Alkaline Phosphatase: 45 U/L (ref 38–126)
Anion gap: 6 (ref 5–15)
BUN: 13 mg/dL (ref 8–23)
CALCIUM: 7.8 mg/dL — AB (ref 8.9–10.3)
CHLORIDE: 108 mmol/L (ref 98–111)
CO2: 27 mmol/L (ref 22–32)
CREATININE: 1.02 mg/dL (ref 0.61–1.24)
GFR calc Af Amer: >60 mL/min (ref >60)
Glucose, Bld: 140 mg/dL — ABNORMAL HIGH (ref 70–99)
Potassium: 3.2 mmol/L — ABNORMAL LOW (ref 3.5–5.1)
Sodium: 141 mmol/L (ref 135–145)
Total Bilirubin: 1.1 mg/dL (ref 0.3–1.2)
Total Protein: 5.3 g/dL — ABNORMAL LOW (ref 6.5–8.1)

## 2017-11-11 LAB — HEMOGLOBIN A1C
HEMOGLOBIN A1C: 6.3 % — AB (ref 4.8–5.6)
MEAN PLASMA GLUCOSE: 134.11 mg/dL

## 2017-11-11 LAB — MAGNESIUM: Magnesium: 1.7 mg/dL (ref 1.7–2.4)

## 2017-11-11 LAB — PHOSPHORUS: Phosphorus: 3.1 mg/dL (ref 2.5–4.6)

## 2017-11-11 LAB — TSH: TSH: 1.67 u[IU]/mL (ref 0.350–4.500)

## 2017-11-11 MED ORDER — GABAPENTIN 600 MG PO TABS
600.0000 mg | ORAL_TABLET | Freq: Three times a day (TID) | ORAL | Status: DC
  Administered 2017-11-11 – 2017-11-15 (×15): 600 mg via ORAL
  Filled 2017-11-11 (×17): qty 1

## 2017-11-11 MED ORDER — PROMETHAZINE HCL 25 MG/ML IJ SOLN: 6.2500 mg | INTRAMUSCULAR | Status: DC | PRN

## 2017-11-11 MED ORDER — ONDANSETRON HCL 4 MG/2ML IJ SOLN
4.0000 mg | Freq: Four times a day (QID) | INTRAMUSCULAR | Status: DC | PRN
  Administered 2017-11-11 – 2017-11-16 (×5): 4 mg via INTRAVENOUS
  Filled 2017-11-11 (×6): qty 2

## 2017-11-11 MED ORDER — PANCRELIPASE (LIP-PROT-AMYL) 36000-114000 UNITS PO CPEP
144000.0000 [IU] | ORAL_CAPSULE | Freq: Three times a day (TID) | ORAL | Status: DC
  Administered 2017-11-11 – 2017-11-12 (×3): 144000 [IU] via ORAL
  Administered 2017-11-13 (×2): 72000 [IU] via ORAL
  Administered 2017-11-14 – 2017-11-16 (×7): 144000 [IU] via ORAL
  Filled 2017-11-11 (×18): qty 4

## 2017-11-11 MED ORDER — TIZANIDINE HCL 4 MG PO TABS: 4.0000 mg | ORAL_TABLET | Freq: Three times a day (TID) | ORAL | Status: DC

## 2017-11-11 MED ORDER — PANTOPRAZOLE SODIUM 40 MG PO TBEC
40.0000 mg | DELAYED_RELEASE_TABLET | Freq: Two times a day (BID) | ORAL | Status: DC
  Administered 2017-11-11: 40 mg via ORAL
  Filled 2017-11-11: qty 1

## 2017-11-11 MED ORDER — CLINDAMYCIN PHOSPHATE 600 MG/50ML IV SOLN
600.0000 mg | Freq: Three times a day (TID) | INTRAVENOUS | Status: DC
  Administered 2017-11-11 – 2017-11-16 (×16): 600 mg via INTRAVENOUS
  Filled 2017-11-11 (×17): qty 50

## 2017-11-11 MED ORDER — IOPAMIDOL (ISOVUE-300) INJECTION 61%
100.0000 mL | Freq: Once | INTRAVENOUS | Status: AC | PRN
  Administered 2017-11-11: 100 mL via INTRAVENOUS

## 2017-11-11 MED ORDER — ALBUTEROL SULFATE (2.5 MG/3ML) 0.083% IN NEBU: 2.5000 mg | INHALATION_SOLUTION | RESPIRATORY_TRACT | Status: DC | PRN

## 2017-11-11 MED ORDER — MENTHOL 3 MG MT LOZG: 1.0000 | LOZENGE | OROMUCOSAL | Status: DC | PRN

## 2017-11-11 MED ORDER — OXYCODONE HCL 5 MG PO TABS
5.0000 mg | ORAL_TABLET | ORAL | Status: DC | PRN
  Administered 2017-11-11 – 2017-11-16 (×19): 5 mg via ORAL
  Filled 2017-11-11 (×21): qty 1

## 2017-11-11 MED ORDER — PANTOPRAZOLE SODIUM 40 MG PO TBEC
40.0000 mg | DELAYED_RELEASE_TABLET | Freq: Every day | ORAL | Status: DC
  Administered 2017-11-11: 40 mg via ORAL
  Filled 2017-11-11: qty 1

## 2017-11-11 MED ORDER — MORPHINE SULFATE (PF) 4 MG/ML IV SOLN
4.0000 mg | INTRAVENOUS | Status: DC | PRN
  Administered 2017-11-11 (×2): 4 mg via INTRAVENOUS
  Filled 2017-11-11 (×2): qty 1

## 2017-11-11 MED ORDER — SODIUM CHLORIDE 0.9 % IV SOLN
INTRAVENOUS | Status: DC
  Administered 2017-11-11 – 2017-11-13 (×4): via INTRAVENOUS

## 2017-11-11 MED ORDER — POLYETHYLENE GLYCOL 3350 17 G PO PACK
17.0000 g | PACK | Freq: Every day | ORAL | Status: DC
  Administered 2017-11-11 – 2017-11-16 (×6): 17 g via ORAL
  Filled 2017-11-11 (×6): qty 1

## 2017-11-11 MED ORDER — POTASSIUM CHLORIDE CRYS ER 20 MEQ PO TBCR
40.0000 meq | EXTENDED_RELEASE_TABLET | Freq: Once | ORAL | Status: AC
  Administered 2017-11-11: 40 meq via ORAL
  Filled 2017-11-11: qty 2

## 2017-11-11 MED ORDER — DULOXETINE HCL 60 MG PO CPEP
60.0000 mg | ORAL_CAPSULE | Freq: Every morning | ORAL | Status: DC
  Administered 2017-11-11 – 2017-11-16 (×6): 60 mg via ORAL
  Filled 2017-11-11 (×6): qty 1

## 2017-11-11 MED ORDER — ONDANSETRON HCL 4 MG PO TABS
4.0000 mg | ORAL_TABLET | Freq: Four times a day (QID) | ORAL | Status: DC | PRN
  Administered 2017-11-16 (×2): 4 mg via ORAL
  Filled 2017-11-11 (×2): qty 1

## 2017-11-11 MED ORDER — FENTANYL CITRATE (PF) 100 MCG/2ML IJ SOLN
INTRAMUSCULAR | Status: AC
  Administered 2017-11-11: 50 ug via INTRAVENOUS
  Filled 2017-11-11: qty 2

## 2017-11-11 MED ORDER — CIPROFLOXACIN IN D5W 400 MG/200ML IV SOLN
400.0000 mg | Freq: Two times a day (BID) | INTRAVENOUS | Status: DC
  Administered 2017-11-11 – 2017-11-16 (×11): 400 mg via INTRAVENOUS
  Filled 2017-11-11 (×14): qty 200

## 2017-11-11 MED ORDER — FENTANYL CITRATE (PF) 100 MCG/2ML IJ SOLN
50.0000 ug | INTRAMUSCULAR | Status: DC | PRN
  Administered 2017-11-11 – 2017-11-14 (×7): 50 ug via INTRAVENOUS
  Filled 2017-11-11 (×6): qty 2

## 2017-11-11 MED ORDER — TAMSULOSIN HCL 0.4 MG PO CAPS
0.4000 mg | ORAL_CAPSULE | Freq: Two times a day (BID) | ORAL | Status: DC
  Administered 2017-11-11 – 2017-11-16 (×12): 0.4 mg via ORAL
  Filled 2017-11-11 (×12): qty 1

## 2017-11-11 MED ORDER — POLYETHYLENE GLYCOL 3350 17 G PO PACK: 17.0000 g | PACK | Freq: Every day | ORAL | Status: DC | PRN

## 2017-11-11 MED ORDER — ACETAMINOPHEN 650 MG RE SUPP: 650.0000 mg | Freq: Four times a day (QID) | RECTAL | Status: DC | PRN

## 2017-11-11 MED ORDER — PANCRELIPASE (LIP-PROT-AMYL) 36000-114000 UNITS PO CPEP
72000.0000 [IU] | ORAL_CAPSULE | Freq: Two times a day (BID) | ORAL | Status: DC | PRN
  Filled 2017-11-11: qty 2

## 2017-11-11 MED ORDER — IOPAMIDOL (ISOVUE-300) INJECTION 61%
INTRAVENOUS | Status: AC
  Filled 2017-11-11: qty 100

## 2017-11-11 MED ORDER — FENTANYL CITRATE (PF) 100 MCG/2ML IJ SOLN
25.0000 ug | INTRAMUSCULAR | Status: DC | PRN
  Administered 2017-11-11 (×2): 50 ug via INTRAVENOUS

## 2017-11-11 MED ORDER — SODIUM CHLORIDE 0.9 % IV SOLN
INTRAVENOUS | Status: DC
  Administered 2017-11-11: 02:00:00 via INTRAVENOUS

## 2017-11-11 MED ORDER — FENTANYL CITRATE (PF) 100 MCG/2ML IJ SOLN
INTRAMUSCULAR | Status: AC
  Filled 2017-11-11: qty 2

## 2017-11-11 MED ORDER — ACETAMINOPHEN 325 MG PO TABS
650.0000 mg | ORAL_TABLET | Freq: Four times a day (QID) | ORAL | Status: DC | PRN
  Administered 2017-11-12 – 2017-11-15 (×3): 650 mg via ORAL
  Filled 2017-11-11 (×3): qty 2

## 2017-11-11 MED ORDER — LEVOTHYROXINE SODIUM 112 MCG PO TABS
112.0000 ug | ORAL_TABLET | Freq: Every day | ORAL | Status: DC
  Administered 2017-11-11 – 2017-11-16 (×6): 112 ug via ORAL
  Filled 2017-11-11 (×6): qty 1

## 2017-11-11 MED ORDER — CLONAZEPAM 0.5 MG PO TABS
0.5000 mg | ORAL_TABLET | Freq: Every day | ORAL | Status: DC
  Administered 2017-11-11 – 2017-11-15 (×6): 0.5 mg via ORAL
  Filled 2017-11-11 (×6): qty 1

## 2017-11-11 NOTE — Plan of Care (Signed)
  Problem: Education: Goal: Knowledge of General Education information will improve Description Including pain rating scale, medication(s)/side effects and non-pharmacologic comfort measures Outcome: Progressing   Problem: Clinical Measurements: Goal: Ability to maintain clinical measurements within normal limits will improve Outcome: Progressing   Problem: Activity: Goal: Risk for activity intolerance will decrease Outcome: Progressing   

## 2017-11-11 NOTE — Progress Notes (Signed)
Patient admitted into 5N27 after his operational intervention related to his dog bite. On arrival alert and oriented. Bilateral hands and Rt foot wrapped in Aces bandages. CDI. Laceration to Rt flank area. Made comfortable in bed and safety measures put in place. Call bell placed within reach. Wife at bedside. Will keep monitoring.

## 2017-11-11 NOTE — Transfer of Care (Signed)
Immediate Anesthesia Transfer of Care Note  Patient: Justin [name]  Procedure(s) Performed: IRRIGATION AND DEBRIDEMENT, BILATERAL UPPER HANDS AND RIGHT FOOT. REVISION AMPUTATION RIGHT RING FINGER. (Bilateral )  Patient Location: PACU  Anesthesia Type:General  Level of Consciousness: drowsy  Airway & Oxygen Therapy: Patient Spontanous Breathing and Patient connected to face mask oxygen  Post-op Assessment: Report given to RN and Post -op Vital signs reviewed and stable  Post vital signs: Reviewed and stable  Last Vitals:  Vitals Value Taken Time  BP 156/91 11/11/2017 12:18 AM  Temp    Pulse 102 11/11/2017 12:22 AM  Resp 20 11/11/2017 12:24 AM  SpO2 100 % 11/11/2017 12:22 AM  Vitals shown include unvalidated device data.  Last Pain:  Vitals:   11/10/17 1929  TempSrc:   PainSc: 10-Worst pain ever         Complications: No apparent anesthesia complications

## 2017-11-11 NOTE — Evaluation (Signed)
Occupational Therapy Evaluation Patient Details Name: Justin [name] MRN: [id number] DOB: Apr 18, 1955 Today's Date: 11/11/2017    History of Present Illness 62 y.o. 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 Presenting with multiple dog bites to BUEs and RLE. Underwent (11/11/17) extensive surgery to BUEs and RLE including: debridement and revision amputation right ring finger at the proximal phalanx level, fasciotomy right forearm, left thumb irrigation debridement extensor apparatus, dorsal hand irrigation debridement, proximal phalanx fracture left hand, left ring finger ulnar and radial digital nerve neuro lysis,right middle finger PIP joint extensor tendon tenolysis tenosynovectomy, debridement right foot, and median nerve neuro lysis right forearm.   Clinical Impression   PTA, pt was living with his wife and was independent. Currently, pt requires Max A for bathing, Min A for UB dressing, Min-Max A for LB dressing, and Min A functional mobility using bilateral platform RW. Pt motivated to participate in therapy and return to PLOF. Pt demonstrating decreased balance, strength, and activity tolerance. Educating pt on edema management and pt verbalized understanding. Pt participating in right hand exercises. Pt will require further acute OT to facilitate safe dc. Recommend dc to home with HHOT for further OT to optimize safety, independence with ADLs, and return to PLOF.       Follow Up Recommendations  Home health OT;Supervision/Assistance - 24 hour    Equipment Recommendations  3 in 1 bedside commode    Recommendations for Other Services PT consult     Precautions / Restrictions Precautions Precautions: Fall Restrictions Weight Bearing Restrictions: Yes RUE Weight Bearing: Weight bearing as tolerated LUE Weight  Bearing: Weight bearing as tolerated RLE Weight Bearing: Weight bearing as tolerated(WB through heel more per Gramig) Other Position/Activity Restrictions: Verbal order per Dr Amanda Pea. No ROM restrictions and WBAT for BUEs and RLE      Mobility Bed Mobility Overal bed mobility: Needs Assistance Bed Mobility: Supine to Sit     Supine to sit: Supervision;HOB elevated     General bed mobility comments: supervision for safety.   Transfers Overall transfer level: Needs assistance Equipment used: None Transfers: Sit to/from Stand Sit to Stand: Min guard         General transfer comment: Min Guard A for safety. VCs for hand placement to optimize independence    Balance Overall balance assessment: Needs assistance Sitting-balance support: No upper extremity supported;Feet supported Sitting balance-Leahy Scale: Good     Standing balance support: No upper extremity supported;During functional activity Standing balance-Leahy Scale: Fair Standing balance comment: Able to maintain static standing                           ADL either performed or assessed with clinical judgement   ADL Overall ADL's : Needs assistance/impaired Eating/Feeding: Minimal assistance;Sitting Eating/Feeding Details (indicate cue type and reason): Min A for managing containers and bilateral coorindation Grooming: Minimal assistance;Sitting Grooming Details (indicate cue type and reason): Min A for bilateral coorindation Upper Body Bathing: Maximal assistance;Sitting Upper Body Bathing Details (indicate cue type and reason): Unable to wet bil hands due to wrapping and post surgical casts. Requiring max A Lower Body Bathing: Maximal assistance Lower Body Bathing Details (indicate cue type and reason): Unable to wet bil hands due to wrapping and post surgical casts. Requiring max A Upper Body Dressing : Minimal assistance;Sitting   Lower Body Dressing:  Minimal assistance;Sit to/from stand;Maximal  assistance Lower Body Dressing Details (indicate cue type and reason): Min A for donning shorts. Educating pt in donning right leg first. Max A for donning socks Toilet Transfer: Minimal assistance;Ambulation(right and left plateform walker)   Toileting- Clothing Manipulation and Hygiene: Minimal assistance;Sit to/from stand Toileting - Clothing Manipulation Details (indicate cue type and reason): Pt able to perform peri care after urination into urinal with Min A for safety in standing     Functional mobility during ADLs: Minimal assistance(right and left plateform walker) General ADL Comments: Pt highly motivated     Vision Baseline Vision/History: Wears glasses Wears Glasses: At all times Patient Visual Report: No change from baseline       Perception     Praxis      Pertinent Vitals/Pain Pain Assessment: 0-10 Pain Score: 10-Worst pain ever Pain Location: RLE, BUEs, and back Pain Descriptors / Indicators: Constant;Discomfort;Grimacing Pain Intervention(s): Monitored during session;Repositioned;Limited activity within patient's tolerance     Hand Dominance Right   Extremity/Trunk Assessment Upper Extremity Assessment Upper Extremity Assessment: RUE deficits/detail;LUE deficits/detail RUE Deficits / Details: Amputation at fourth digit and multiple injuries to hand and forearm. Able to perform composite extension and flexion. WFL elbow and shoulder. sensation intact RUE: Unable to fully assess due to immobilization;Unable to fully assess due to pain RUE Coordination: decreased fine motor LUE Deficits / Details: Hand and wrist completely wrapped and splinted. Pt able to wiggle fingers within splint. Elbow and shoulder WFL LUE Coordination: decreased fine motor   Lower Extremity Assessment Lower Extremity Assessment: Defer to PT evaluation;RLE deficits/detail RLE Deficits / Details: Multiple dog bites to right foot.    Cervical / Trunk Assessment Cervical / Trunk  Assessment: Normal   Communication Communication Communication: No difficulties   Cognition Arousal/Alertness: Awake/alert Behavior During Therapy: WFL for tasks assessed/performed Overall Cognitive Status: Within Functional Limits for tasks assessed                                 General Comments: Decreased safety awareness and requiring increased time and cues. Presenting near baseline cognition   General Comments  VSS    Exercises Exercises: Hand exercises;Other exercises Hand Exercises Digit Composite Flexion: AROM;Right;10 reps;Seated Composite Extension: AROM;Right;10 reps;Seated Other Exercises Other Exercises: Educating pt on edema magement through elevation and ROM. Pt verbalized understanding   Shoulder Instructions      Home Living Family/patient expects to be discharged to:: Private residence Living Arrangements: Spouse/significant other Available Help at Discharge: Family;Available PRN/intermittently Type of Home: House Home Access: Stairs to enter Entrance Stairs-Number of Steps: 3   Home Layout: Laundry or work area in basement;One level     Bathroom Shower/Tub: Curtain;Walk-in shower   Bathroom Toilet: Standard     Home Equipment: Cane - single point          Prior Functioning/Environment Level of Independence: Independent with assistive device(s)        Comments: Cane as needed for fucntional mobility        OT Problem List: Decreased strength;Decreased range of motion;Decreased activity tolerance;Impaired balance (sitting and/or standing);Decreased knowledge of use of DME or AE;Decreased knowledge of precautions;Decreased safety awareness;Pain;Impaired UE functional use      OT Treatment/Interventions: Self-care/ADL training;Therapeutic exercise;Energy conservation;DME and/or AE instruction;Therapeutic activities;Patient/family education    OT Goals(Current goals can be found in the care plan section) Acute Rehab OT  Goals Patient Stated Goal: "Throw ball with grandson" OT  Goal Formulation: With patient Time For Goal Achievement: 11/25/17 Potential to Achieve Goals: Good ADL Goals Pt Will Perform Upper Body Dressing: with caregiver independent in assisting;with set-up;with supervision;sitting Pt Will Perform Lower Body Dressing: with set-up;with supervision;sit to/from stand Pt Will Transfer to Toilet: with set-up;with supervision;ambulating;regular height toilet Pt Will Perform Toileting - Clothing Manipulation and hygiene: with set-up;with supervision;sit to/from stand;with caregiver independent in assisting Pt Will Perform Tub/Shower Transfer: Shower transfer;ambulating;3 in 1;rolling walker;with min guard assist Pt/caregiver will Perform Home Exercise Program: Increased ROM;Independently;Both right and left upper extremity;With written HEP provided  OT Frequency: Min 3X/week   Barriers to D/C:            Co-evaluation PT/OT/SLP Co-Evaluation/Treatment: Yes Reason for Co-Treatment: To address functional/ADL transfers   OT goals addressed during session: ADL's and self-care      AM-PAC PT "6 Clicks" Daily Activity     Outcome Measure Help from another person eating meals?: A Little Help from another person taking care of personal grooming?: A Little Help from another person toileting, which includes using toliet, bedpan, or urinal?: A Little Help from another person bathing (including washing, rinsing, drying)?: A Lot Help from another person to put on and taking off regular upper body clothing?: A Little Help from another person to put on and taking off regular lower body clothing?: A Lot 6 Click Score: 16   End of Session Equipment Utilized During Treatment: Other (comment)(Bilateral plateform walker) Nurse Communication: Mobility status;Precautions;Weight bearing status  Activity Tolerance: Patient tolerated treatment well Patient left: in chair;with call bell/phone within reach  OT  Visit Diagnosis: Unsteadiness on feet (R26.81);Other abnormalities of gait and mobility (R26.89);Muscle weakness (generalized) (M62.81);Pain Pain - Right/Left: (Bilateral) Pain - part of body: Arm;Hand;Leg;Ankle and joints of foot                Time: 1448-1516 OT Time Calculation (min): 28 min Charges:  OT General Charges $OT Visit: 1 Visit OT Evaluation $OT Eval Moderate Complexity: 1 Mod  Lataysha Vohra MSOT, OTR/L Acute Rehab Pager: [phone] Office: [phone]  Theodoro Grist Saniah Schroeter 11/11/2017, 5:13 PM

## 2017-11-11 NOTE — Progress Notes (Signed)
PT Cancellation Note  Patient Details Name: Justin [name] MRN: [id number] DOB: 28-Jun-1955   Cancelled Treatment:    Reason Eval/Treat Not Completed: Patient at procedure or test/unavailable   Laurina Bustle, PT, DPT Acute Rehabilitation Services  Pager: [phone]    Vanetta Mulders 11/11/2017, 11:14 AM

## 2017-11-11 NOTE — Op Note (Signed)
[facility] hospital operative dictation 11/10/2017 Preoperative diagnosis- pit bull bites about the right hand with associated right ring finger amputation and puncture wounds to the right forearm. Left hand multiple dog bites about the dorsal hand index middle ring and small fingers as well as dorsal thumb region. Right foot multiple puncture wounds secondary to dog bite with significant swelling and pain  Postop diagnosis - same   Procedure #1 irrigation debridement and revision amputation right ring finger at the proximal phalanx level #2 irrigation debridement with fasciotomy right forearm #3 left thumb irrigation debridement extensor apparatus this was a tenosynovectomy and Tina lysis of the extensor apparatus secondary to a dog bite #4 dorsal hand irrigation debridement 4 separate bite wounds skin is obtained tissue and muscle debrided #5 open treatment proximal phalanx fracture left hand proximal phalanx #6 left ring finger ulnar and radial digital nerve neuro lysis, flexor sheath debridement and loose closure secondary laceration from dog bite #7 right middle finger PIP joint extensor tendon Tenolysis tenosynovectomy and debridement #8 stress radiography right and left hands as well as right foot #9 irrigation debridement skin subcutaneous tissue 5 separate sites right foot #10 stress radiography right foot 4 view radiographic series #11 median nerve neuro lysis right forearm Surgeon Dominica Severin  Anesthesia Gen.  Estimated blood loss less than 100 mL  Drains were placed in multiple locations 5 in the right foot and 4 in the left hand  Tourniquet time was less than an hour in the left upper extremity only tourniquet was not used about the right foot and right arm  Indications for procedure: Patient was mauled this evening by a pit bull. He presents with the above-mentioned injuries. I've counseled him in regards to risk and benefits of surgery including risk of infection  bleeding anesthesia damage to normal structures and failure of the surgery to accomplish [place] in 10 to goals of relieving symptoms and restoring function.  Operative procedure: Patient was taken to the operative theater and underwent a straight induction of general anesthesia. I personally performed pre-scrub to the right and left upper extremities. It was a large amount of grass and debris about both arms. 3 Hibiclens scrub brushes were used on both right and left arms followed by 10 minute surgical Betadine scrub and paint on the right and left arms.  At this time the right arm was dressed with irrigation and debridement of skin subcutaneous tissue and muscle. The patient underwent a fasciotomy about the fact identified the median nerve and performed a median nerve neuro lysis and explore this to make sure that it was not injured. The median nerve was intact and stable. Thus median nerve neuro lysis and debridement of skin subtendinous tissue as well as muscle tissue right forearm was accomplished. Following this we performed a compartmental release this was a fasciotomy of the right volar forearm. Once this was complete attention was turned towards the ring finger atraumatic amputation was noted. This time I performed irrigation debridement of skin subtendinous tissue bone and associated soft tissue we then performed a revision amputation to the ring finger with bilateral neurectomies and achieving hemostasis. Following this we very carefully performed a flap closure after 3 L of saline were placed through and through the area.  One conditions were excellent. I severed the flexor tendons perform bilateral neurectomies and made sure that 3 L were placed through and through the area prior to the loose closure. The patient tolerated this well and at this time we dressed the  right arm and turned attention towards the left upper extremity. Prior to leaving the right upper extremity for the radiographic views were  performed to verify no fracture. The level of amputation was noted however about the affected ring finger.  Following this attention was turned towards the left upper extremity. The left upper extremity underwent irrigation and debridement of the dorsal left thumb. The dorsal left thumb underwent extensor tendon tenosynovectomy and tendon lysis in 2 separate locations followed by irrigation and removal of devitalized tissue. Once this was complete for dorsal hand wounds were irrigated. Skin subtendinous tissue and muscle were copiously debrided and irrigated without difficulty. Following this an additional laceration was opened and we then performed open treatment of a proximal phalanx fracture about the index finger with irrigation and debridement and repair as necessary utilizing setting technique.  Following this the middle finger PIP joint underwent a extensor tendon T lysis and tenosynovectomy followed by irrigation and packing of the wound. Following this we then performed irrigation and debridement of the ring finger with ulnar and radial digital nerve neuro lysis as well as flexor tendon T lysis. The tendon was intact the nerves were intact irrigation was accomplished and we then placed 6 L of fluid through and through all areas. I I then loosely closed the ring finger. The other wounds were left open and packed with drains. The patient tolerated this well there were no complications.  Once this was complete a thorough x-ray exam of the hand showed no fracture and all look well 4 view radiographic series was reviewed about the left hand by myself.  Following this we then performed irrigation debridement of 5 separate wounds about the foot and x-rays revealed no fracture. 5 the radiographic series about the foot was performed examined and interpreted by myself and looked to be excellent. Following this we informed very careful and cautious approach to the foot with sterile wrapping. The left upper  extremity was wrapped and splint was placed.  Moving forward we will plan for return to the operative theater Monday. I discussed these issues with the patient and the findings. We certainly need to perform a dressing change and this will need to be under anesthesia. Given his penicillin allergy we'll stick with clindamycin and Cipro in hopes to eradicate any infectious possibilities.  It was a pleasure to see him today.he was taken to recovery in stable condition  Tomisha Reppucci M.D.

## 2017-11-11 NOTE — Progress Notes (Signed)
1 Day Post-Op   Subjective/Chief Complaint: Asked to see him for multiple dog bites, taken to or by ortho overnight but has area on right flank that is tender and he thinks was bitten there. Due for or again tomorrow with ortho   Objective: Vital signs in last 24 hours: Temp:  [97.3 F (36.3 C)-99.3 F (37.4 C)] 98.3 F (36.8 C) (09/01 1434) Pulse Rate:  [76-113] 81 (09/01 1434) Resp:  [15-21] 16 (09/01 1434) BP: (124-169)/(71-95) 142/78 (09/01 1434) SpO2:  [100 %] 100 % (09/01 1434) Weight:  [70.8 kg] 70.8 kg (08/31 1943)    Intake/Output from previous day: 08/31 0701 - 09/01 0700 In: 2069.8 [P.O.:360; I.V.:1709.8] Out: 450 [Urine:400; Blood:50] Intake/Output this shift: Total I/O In: 1515.5 [P.O.:360; I.V.:721; IV Piggyback:434.5] Out: -   right flank with multiple abrasions, tender and some erythema. i dont really identify an undrained collection  Lab Results:  Recent Labs    11/10/17 2108 11/10/17 2122 11/11/17 0442  WBC 12.7*  --  10.6*  HGB 10.7* 10.5* 9.9*  HCT 32.6* 31.0* 30.6*  PLT 168  --  169   BMET Recent Labs    11/10/17 2108 11/10/17 2122 11/11/17 0442  NA 141 142 141  K 3.2* 3.3* 3.2*  CL 108 106 108  CO2 25  --  27  GLUCOSE 189* 185* 140*  BUN 16 18 13   CREATININE 1.17 1.20 1.02  CALCIUM 8.5*  --  7.8*   PT/INR No results for input(s): LABPROT, INR in the last 72 hours. ABG No results for input(s): PHART, HCO3 in the last 72 hours.  Invalid input(s): PCO2, PO2  Studies/Results: Dg Ribs Unilateral Right  Result Date: 11/11/2017 CLINICAL DATA:  Attacked by a pit bull yesterday.  Right rib pain. EXAM: RIGHT RIBS - 2 VIEW COMPARISON:  None. FINDINGS: No fracture or other bone lesions are seen involving the ribs. No hemothorax or pneumothorax. Atelectasis in both lower lobes. IMPRESSION: 1. No evident fracture. 2. Bilateral lower lobe atelectasis. Electronically Signed   By: 01/11/2018 M.D.   On: 11/11/2017 13:14   Dg Ankle Complete  Right  Result Date: 11/11/2017 CLINICAL DATA:  Posttraumatic right ankle pain.  Initial encounter. EXAM: RIGHT ANKLE - COMPLETE 3+ VIEW COMPARISON:  None. FINDINGS: Lateral soft tissue swelling. There is no evidence of fracture, dislocation, or joint effusion. Heel spur. Arterial calcification IMPRESSION: Soft tissue swelling without fracture. Electronically Signed   By: 01/11/2018 M.D.   On: 11/11/2017 13:15   Dg Hip Unilat With Pelvis 2-3 Views Right  Result Date: 11/11/2017 CLINICAL DATA:  62 year old male status post dog attack EXAM: DG HIP (WITH OR WITHOUT PELVIS) 2-3V RIGHT COMPARISON:  None. FINDINGS: There is no evidence of hip fracture or dislocation. There is no evidence of arthropathy or other focal bone abnormality. IMPRESSION: Negative. Electronically Signed   By: 68 M.D.   On: 11/11/2017 13:04    Anti-infectives: Anti-infectives (From admission, onward)   Start     Dose/Rate Route Frequency Ordered Stop   11/11/17 0800  ciprofloxacin (CIPRO) IVPB 400 mg     400 mg 200 mL/hr over 60 Minutes Intravenous 2 times daily 11/11/17 0137     11/11/17 0400  clindamycin (CLEOCIN) IVPB 600 mg     600 mg 100 mL/hr over 30 Minutes Intravenous Every 8 hours 11/11/17 0137     11/10/17 2015  ciprofloxacin (CIPRO) IVPB 400 mg     400 mg 200 mL/hr over 60 Minutes Intravenous  Once 11/10/17 2004 11/10/17 2114   11/10/17 2015  clindamycin (CLEOCIN) IVPB 600 mg     600 mg 100 mL/hr over 30 Minutes Intravenous  Once 11/10/17 2004 11/10/17 2049      Assessment/Plan: Dog bites  Will get imaging to make sure nothing underlying this as difficult to assess secondary to pain Will look at it in am also and review ct, if something needs to be drained can add on to ortho case  Emelia Loron 11/11/2017

## 2017-11-11 NOTE — Progress Notes (Addendum)
PROGRESS NOTE    Justin [name]  HDQ:[id number] DOB: 1955-04-26 DOA: 11/10/2017 PCP: [name], MD    Brief Narrative: Justin [name] is a 62 y.o. 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 Presented with being bitten multiple times by his dog. Family states that he was at home when neighbors dogs approached got in a fight with his dog he was trying to separate a fight and his dog attacked him biting him repeatedly on his hands feet side of abdomen. 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  Underwent I and D 8-31. : revision amputation right finger, proximal phalange level. Fasciotomy right arm, left thumb irrigation and debridement extensor apparatus. Dorsal hand irrigation, open treatment of proximal phalange left hand, left ring finger ulnar and radial digital nerve neurolysis. Irrigation and debridement right foot.  Appreciate Dr Cliffton Asters Help.  Continue with IV antibiotics.  Plan to return to OR on Monday.  Fentanyl for pain controlled.  Right side, abdomen; abrasion, is edematous, very tender to palpation. Will ask sx to evaluate.   2-Right ankle pain and hip pain;  Will get x ray. He report he feel on his right hip.   History of pancreatitis, common bile duct stricture.  Follow LFT.  He report mild abdominal pain.  IV fluids. Pain management Diet as tolerated.  PPI.    Hypokalemia; replete orally    DVT prophylaxis: follow ortho rec.  Code Status: Full code.  Family Communication: Wife at bedside.  Disposition Plan: remain inpatient for IV antibiotics pain management.   Consultants:   Dr Cliffton Asters    Procedures:   ID   Antimicrobials:  Clindamycin ciprofloxacin  Subjective: He is having a lot of pain, hand, right hip, right ankle.  Report also abdominal pain   Objective: Vitals:   11/11/17 0100 11/11/17 0115 11/11/17 0140 11/11/17 0654  BP: (!) 168/90 (!) 169/95 (!) 160/91 (!) 144/77  Pulse: (!) 107 84 86 76  Resp: 15 (!) 21    Temp: (!) 97.4 F (36.3 C)   98 F (36.7 C)  TempSrc:    Oral  SpO2: 100% 100% 100% 100%  Weight:      Height:        Intake/Output Summary (Last 24 hours) at 11/11/2017 1028 Last data filed at 11/11/2017 0900 Gross per 24 hour  Intake 2429.84 ml  Output 450 ml  Net 1979.84 ml   Filed Weights   11/10/17 1943  Weight: 70.8 kg    Examination:  General exam: Appears calm and comfortable  Respiratory system: Clear to auscultation. Respiratory effort normal. Cardiovascular system: S1 & S2 heard, RRR. No JVD, murmurs, rubs, gallops or clicks. No pedal edema. Gastrointestinal system: Abdomen is nondistended, soft and nontender. No organomegaly or masses felt. Normal bowel sounds heard. Central nervous system: Alert and oriented Extremities: right ankle with dressing, BL hands with dressing.  Skin: abrasion right side abdomen,  Data Reviewed: I have personally reviewed following labs and imaging studies  CBC: Recent Labs  Lab 11/10/17 2108 11/10/17 2122 11/11/17 0442  WBC 12.7*  --  10.6*  NEUTROABS 11.0*  --   --   HGB 10.7* 10.5* 9.9*  HCT 32.6* 31.0* 30.6*  MCV 87.4  --  87.9  PLT 168  --  169   Basic Metabolic Panel: Recent Labs  Lab 11/10/17 2108 11/10/17 2122 11/11/17 0442  NA 141 142 141  K 3.2* 3.3* 3.2*  CL 108 106 108  CO2 25  --  27  GLUCOSE 189* 185* 140*  BUN 16 18  13   CREATININE 1.17 1.20 1.02  CALCIUM 8.5*  --  7.8*  MG  --   --  1.7  PHOS  --   --  3.1   GFR: Estimated Creatinine Clearance: 70.2 mL/min (by C-G formula based on SCr of 1.02 mg/dL). Liver Function Tests: Recent Labs  Lab 11/10/17 2108 11/11/17 0442  AST 16 142*  ALT 17 79*  ALKPHOS 52 45  BILITOT 0.4 1.1  PROT 5.6* 5.3*  ALBUMIN 3.2* 3.1*   No results for input(s): LIPASE, AMYLASE in the last 168 hours. No results for input(s): AMMONIA in the last 168 hours. Coagulation Profile: No results for input(s): INR, PROTIME in the last 168 hours. Cardiac Enzymes: No results for input(s): CKTOTAL, CKMB, CKMBINDEX, TROPONINI in the last 168 hours. BNP (last 3 results) No results for input(s): PROBNP in the last 8760 hours. HbA1C: Recent Labs    11/11/17 0442  HGBA1C 6.3*   CBG: No results for input(s): GLUCAP in the last 168 hours. Lipid Profile: No results for input(s): CHOL, HDL, LDLCALC, TRIG, CHOLHDL, LDLDIRECT in the last 72 hours. Thyroid Function Tests: Recent Labs    11/11/17 0442  TSH 1.670   Anemia Panel: No results for input(s): VITAMINB12, FOLATE, FERRITIN, TIBC, IRON, RETICCTPCT in the last 72 hours. Sepsis Labs: No results for input(s): PROCALCITON, LATICACIDVEN in the last 168 hours.  No results found for this or any previous visit (from the past 240 hour(s)).       Radiology Studies: No results found.      Scheduled Meds: . clonazePAM  0.5 mg Oral QHS  . DULoxetine  60 mg Oral q morning - 10a  . gabapentin  600 mg Oral TID  . levothyroxine  112 mcg Oral QAC breakfast  . lipase/protease/amylase  144,000 Units Oral TID WC  . pantoprazole  40 mg Oral Daily  . polyethylene glycol  17 g Oral Daily  . potassium chloride  40 mEq Oral Once  . tamsulosin  0.4 mg Oral BID  . tiZANidine  4 mg Oral TID   Continuous Infusions: . sodium chloride    . ciprofloxacin    . clindamycin (CLEOCIN) IV 600 mg (11/11/17 0416)     LOS: 1 day     Time spent:35 minutes     01/11/18, MD [facility] Pager [phone]  If 7PM-7AM, please contact night-coverage [email or web address] Password [facility] 11/11/2017, 10:28 AM

## 2017-11-11 NOTE — Progress Notes (Signed)
OT Cancellation Note  Patient Details Name: Justin [name] MRN: [id number] DOB: January 10, 1956   Cancelled Treatment:    Reason Eval/Treat Not Completed: Patient at procedure or test/ unavailable(Will return as schedule allows.)  [facility] MSOT, OTR/L Acute Rehab Pager: [phone] Office: [phone] 11/11/2017, 11:06 AM

## 2017-11-11 NOTE — Progress Notes (Signed)
Patient ID: Justin [name], male   DOB: 06/13/1955, 62 y.o.   MRN: [id number] Patient seen and examined at bedside.  Patient is painful as expected.  I went over the surgical findings.  He is tolerating his antibiotics reasonably well. He is on Cipro and clindamycin.  I discussed with patient that we will plan for return to the operative theater tomorrow morning for repeat irrigation and debridement of the right foot right and left hands and her per reconstruction is necessary. I discussed with him elevation range of motion and other measures to try to help him along.  I have reviewed his x-rays of the ankle and hip and these appear to be negative.  I do not see where radiology has read them but have reviewed them myself.  I did take x-rays of his right and left hands as well as his right foot at the time of surgery is noted. He had no obvious fracture. The wounds are very significant deep and significant in terms of its potential/their potential towards infection. Thus we'll plan for return to the operative theater tomorrow. He understands this. I discussed all issues with the family.  All questions have been encouraged and answered.  I discussed with him that we will need to wash him out again placed drains and ultimately get him to a program of wet-to-dry dressing changes into the future to allow these areas to heal.  The main concern is infection initially followed by restoration of function in the days and months to come  Matvey Llanas M.D.

## 2017-11-11 NOTE — Evaluation (Signed)
Physical Therapy Evaluation Patient Details Name: Justin [name] MRN: [id number] DOB: 01/04/1956 Today's Date: 11/11/2017   History of Present Illness  62 y.o. 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 Presenting with multiple dog bites to BUEs and RLE. Underwent (11/11/17) extensive surgery to BUEs and RLE including: debridement and revision amputation right ring finger at the proximal phalanx level, fasciotomy right forearm, left thumb irrigation debridement extensor apparatus, dorsal hand irrigation debridement, proximal phalanx fracture left hand, left ring finger ulnar and radial digital nerve neuro lysis,right middle finger PIP joint extensor tendon tenolysis tenosynovectomy, debridement right foot, and median nerve neuro lysis right forearm.  Clinical Impression  Pt admitted with above diagnosis. Currently demonstrates excellent activity tolerance, motivation, and participation with therapy. Ambulating 80 feet with min guard assist using bilateral platform rolling walker. Platform walker is beneficial for right foot pain control. Recommend OPPT at discharge for range of motion, strengthening, and gait training. Will follow acutely.    Follow Up Recommendations Outpatient PT    Equipment Recommendations  Other (comment)(Bilateral platform rolling walker)    Recommendations for Other Services       Precautions / Restrictions Precautions Precautions: Fall Restrictions Weight Bearing Restrictions: Yes RUE Weight Bearing: Weight bearing as tolerated LUE Weight Bearing: Weight bearing as tolerated RLE Weight Bearing: Weight bearing as tolerated(WB through heel more per Gramig) Other Position/Activity Restrictions: Verbal order per Dr Amanda Pea. No ROM restrictions and WBAT for BUEs and RLE      Mobility  Bed  Mobility Overal bed mobility: Needs Assistance Bed Mobility: Supine to Sit     Supine to sit: Supervision;HOB elevated     General bed mobility comments: supervision for safety.   Transfers Overall transfer level: Needs assistance Equipment used: None Transfers: Sit to/from Stand Sit to Stand: Min guard         General transfer comment: Min Guard A for safety. VCs for hand placement to optimize independence  Ambulation/Gait Ambulation/Gait assistance: Min guard Gait Distance (Feet): 80 Feet Assistive device: Bilateral platform walker Gait Pattern/deviations: Step-through pattern;Decreased weight shift to right;Antalgic;Trunk flexed     General Gait Details: Patient ambulating with min guard using platform walker. Initially demonstrates good posture and gait speed but does lose form with fatigue. Able to weightbear through RLE   Stairs            Wheelchair Mobility    Modified Rankin (Stroke Patients Only)       Balance Overall balance assessment: Needs assistance Sitting-balance support: No upper extremity supported;Feet supported Sitting balance-Leahy Scale: Good     Standing balance support: No upper extremity supported;During functional activity Standing balance-Leahy Scale: Fair Standing balance comment: Able to maintain static standing                             Pertinent Vitals/Pain Pain Assessment: 0-10 Pain Score: 10-Worst pain ever Pain Location: RLE, BUEs, and back Pain Descriptors / Indicators: Constant;Discomfort;Grimacing Pain Intervention(s): Monitored during session;Limited activity within patient's tolerance;Repositioned    Home Living Family/patient expects to be discharged to:: Private residence Living Arrangements: Spouse/significant other Available Help at Discharge: Family;Available PRN/intermittently Type of Home: House Home Access: Stairs to enter   Entrance Stairs-Number of Steps: 3 Home Layout: Laundry or  work area in basement;One level Home Equipment: Cane - single point  Prior Function Level of Independence: Independent with assistive device(s)         Comments: Cane as needed for fucntional mobility     Hand Dominance   Dominant Hand: Right    Extremity/Trunk Assessment   Upper Extremity Assessment Upper Extremity Assessment: RUE deficits/detail;Defer to OT evaluation RUE Deficits / Details: 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 Pt able to wiggle fingers within splint. Elbow and shoulder WFL LUE Coordination: decreased fine motor    Lower Extremity Assessment Lower Extremity Assessment: RLE deficits/detail RLE Deficits / Details: multiple dog bites to right foot and bandage in place. Overall at least anti gravity and able to perform toe flexion/extension    Cervical / Trunk Assessment Cervical / Trunk Assessment: Normal  Communication   Communication: No difficulties  Cognition Arousal/Alertness: Awake/alert Behavior During Therapy: WFL for tasks assessed/performed Overall Cognitive Status: Within Functional Limits for tasks assessed                                 General Comments: Decreased safety awareness and requiring increased time and cues. Presenting near baseline cognition      General Comments General comments (skin integrity, edema, etc.): VSS    Exercises Hand Exercises Digit Composite Flexion: AROM;Right;10 reps;Seated Composite Extension: AROM;Right;10 reps;Seated Other Exercises Other Exercises: Educating pt on edema magement through elevation and ROM. Pt verbalized understanding   Assessment/Plan    PT Assessment Patient needs continued PT services  PT Problem List Decreased strength;Decreased range of  motion;Decreased activity tolerance;Decreased balance;Decreased mobility;Pain       PT Treatment Interventions DME instruction;Gait training;Stair training;Functional mobility training;Therapeutic activities;Therapeutic exercise;Balance training;Neuromuscular re-education;Patient/family education    PT Goals (Current goals can be found in the Care Plan section)  Acute Rehab PT Goals Patient Stated Goal: "Throw ball with grandson" PT Goal Formulation: With patient/family Time For Goal Achievement: 11/25/17 Potential to Achieve Goals: Good    Frequency Min 4X/week   Barriers to discharge        Co-evaluation PT/OT/SLP Co-Evaluation/Treatment: Yes Reason for Co-Treatment: For patient/therapist safety;To address functional/ADL transfers PT goals addressed during session: Mobility/safety with mobility OT goals addressed during session: ADL's and self-care       AM-PAC PT "6 Clicks" Daily Activity  Outcome Measure Difficulty turning over in bed (including adjusting bedclothes, sheets and blankets)?: A Little Difficulty moving from lying on back to sitting on the side of the bed? : A Little Difficulty sitting down on and standing up from a chair with arms (e.g., wheelchair, bedside commode, etc,.)?: A Little Help needed moving to and from a bed to chair (including a wheelchair)?: A Little Help needed walking in hospital room?: A Little Help needed climbing 3-5 steps with a railing? : A Little 6 Click Score: 18    End of Session   Activity Tolerance: Patient tolerated treatment well Patient left: in chair;with call bell/phone within reach Nurse Communication: Mobility status PT Visit Diagnosis: Other abnormalities of gait and mobility (R26.89);Pain;Difficulty in walking, not elsewhere classified (R26.2) Pain - Right/Left: Right Pain - part of body: Arm;Leg;Hand    Time: 0932-3557 PT Time Calculation (min) (ACUTE ONLY): 29 min   Charges:   PT Evaluation $PT Eval Moderate  Complexity: 1 Mod  Laurina Bustle, PT, DPT Acute Rehabilitation Services  Pager: [phone]   Vanetta Mulders 11/11/2017, 5:21 PM

## 2017-11-12 ENCOUNTER — Encounter (HOSPITAL_COMMUNITY): Payer: Self-pay | Admitting: Orthopedic Surgery

## 2017-11-12 ENCOUNTER — Encounter (HOSPITAL_COMMUNITY)
Admission: EM | Disposition: A | Discharge status: Home Health | Payer: Self-pay | Source: Home / Self Care | Attending: Internal Medicine

## 2017-11-12 ENCOUNTER — Inpatient Hospital Stay (HOSPITAL_COMMUNITY): Payer: Medicare Other | Admitting: Anesthesiology

## 2017-11-12 HISTORY — PX: I & D EXTREMITY: SHX5045

## 2017-11-12 HISTORY — PX: INCISION AND DRAINAGE HIP: SHX1801

## 2017-11-12 LAB — CBC
HEMATOCRIT: 29.7 % — AB (ref 39.0–52.0)
HEMOGLOBIN: 9.7 g/dL — AB (ref 13.0–17.0)
MCH: 28.5 pg (ref 26.0–34.0)
MCHC: 32.7 g/dL (ref 30.0–36.0)
MCV: 87.4 fL (ref 78.0–100.0)
Platelets: 142 10*3/uL — ABNORMAL LOW (ref 150–400)
RBC: 3.4 MIL/uL — ABNORMAL LOW (ref 4.22–5.81)
RDW: 15.1 % (ref 11.5–15.5)
WBC: 5.7 10*3/uL (ref 4.0–10.5)

## 2017-11-12 LAB — COMPREHENSIVE METABOLIC PANEL
ALBUMIN: 2.8 g/dL — AB (ref 3.5–5.0)
ALK PHOS: 53 U/L (ref 38–126)
ALT: 85 U/L — ABNORMAL HIGH (ref 0–44)
ANION GAP: 7 (ref 5–15)
AST: 38 U/L (ref 15–41)
BILIRUBIN TOTAL: 0.6 mg/dL (ref 0.3–1.2)
BUN: 5 mg/dL — AB (ref 8–23)
CALCIUM: 8.3 mg/dL — AB (ref 8.9–10.3)
CO2: 23 mmol/L (ref 22–32)
Chloride: 111 mmol/L (ref 98–111)
Creatinine, Ser: 0.98 mg/dL (ref 0.61–1.24)
GFR calc Af Amer: >60 mL/min (ref >60)
GFR calc non Af Amer: >60 mL/min (ref >60)
GLUCOSE: 118 mg/dL — AB (ref 70–99)
Potassium: 3.9 mmol/L (ref 3.5–5.1)
Sodium: 141 mmol/L (ref 135–145)
TOTAL PROTEIN: 5.5 g/dL — AB (ref 6.5–8.1)

## 2017-11-12 LAB — SURGICAL PCR SCREEN
MRSA, PCR: NEGATIVE
STAPHYLOCOCCUS AUREUS: NEGATIVE

## 2017-11-12 LAB — GLUCOSE, CAPILLARY: Glucose-Capillary: 177 mg/dL — ABNORMAL HIGH (ref 70–99)

## 2017-11-12 SURGERY — IRRIGATION AND DEBRIDEMENT EXTREMITY
Anesthesia: General | Site: Foot | Laterality: Right

## 2017-11-12 MED ORDER — PROPOFOL 10 MG/ML IV BOLUS
INTRAVENOUS | Status: AC
  Filled 2017-11-12: qty 20

## 2017-11-12 MED ORDER — MIDAZOLAM HCL 2 MG/2ML IJ SOLN
INTRAMUSCULAR | Status: AC
  Filled 2017-11-12: qty 2

## 2017-11-12 MED ORDER — PROPOFOL 10 MG/ML IV BOLUS
INTRAVENOUS | Status: DC | PRN
  Administered 2017-11-12: 140 mg via INTRAVENOUS
  Administered 2017-11-12: 20 mg via INTRAVENOUS

## 2017-11-12 MED ORDER — EPHEDRINE SULFATE 50 MG/ML IJ SOLN
INTRAMUSCULAR | Status: DC | PRN
  Administered 2017-11-12 (×4): 10 mg via INTRAVENOUS

## 2017-11-12 MED ORDER — LIDOCAINE 2% (20 MG/ML) 5 ML SYRINGE
INTRAMUSCULAR | Status: AC
  Filled 2017-11-12: qty 5

## 2017-11-12 MED ORDER — BACITRACIN ZINC 500 UNIT/GM EX OINT
1.0000 "application " | TOPICAL_OINTMENT | Freq: Two times a day (BID) | CUTANEOUS | Status: DC
  Administered 2017-11-12 – 2017-11-16 (×8): 1 via TOPICAL
  Filled 2017-11-12: qty 28.4

## 2017-11-12 MED ORDER — 0.9 % SODIUM CHLORIDE (POUR BTL) OPTIME
TOPICAL | Status: DC | PRN
  Administered 2017-11-12: 1000 mL

## 2017-11-12 MED ORDER — BIOTENE DRY MOUTH MT LIQD: 15.0000 mL | OROMUCOSAL | Status: DC | PRN

## 2017-11-12 MED ORDER — SODIUM CHLORIDE 0.9 % IR SOLN
Status: DC | PRN
  Administered 2017-11-12 (×2): 3000 mL

## 2017-11-12 MED ORDER — FENTANYL CITRATE (PF) 100 MCG/2ML IJ SOLN: 25.0000 ug | INTRAMUSCULAR | Status: DC | PRN

## 2017-11-12 MED ORDER — FENTANYL CITRATE (PF) 100 MCG/2ML IJ SOLN
INTRAMUSCULAR | Status: DC | PRN
  Administered 2017-11-12: 100 ug via INTRAVENOUS

## 2017-11-12 MED ORDER — LACTATED RINGERS IV SOLN
INTRAVENOUS | Status: DC | PRN
  Administered 2017-11-12 (×2): via INTRAVENOUS

## 2017-11-12 MED ORDER — MIDAZOLAM HCL 5 MG/5ML IJ SOLN
INTRAMUSCULAR | Status: DC | PRN
  Administered 2017-11-12: 2 mg via INTRAVENOUS

## 2017-11-12 MED ORDER — ONDANSETRON HCL 4 MG/2ML IJ SOLN
INTRAMUSCULAR | Status: DC | PRN
  Administered 2017-11-12: 4 mg via INTRAVENOUS

## 2017-11-12 MED ORDER — BUPIVACAINE HCL (PF) 0.25 % IJ SOLN
INTRAMUSCULAR | Status: AC
  Filled 2017-11-12: qty 30

## 2017-11-12 MED ORDER — TIZANIDINE HCL 4 MG PO TABS: 4.0000 mg | ORAL_TABLET | Freq: Three times a day (TID) | ORAL | Status: DC

## 2017-11-12 MED ORDER — DEXMEDETOMIDINE HCL IN NACL 200 MCG/50ML IV SOLN
INTRAVENOUS | Status: DC | PRN
  Administered 2017-11-12 (×3): 4 ug via INTRAVENOUS

## 2017-11-12 MED ORDER — DEXAMETHASONE SODIUM PHOSPHATE 10 MG/ML IJ SOLN
INTRAMUSCULAR | Status: AC
  Filled 2017-11-12: qty 1

## 2017-11-12 MED ORDER — EPHEDRINE 5 MG/ML INJ
INTRAVENOUS | Status: AC
  Filled 2017-11-12: qty 10

## 2017-11-12 MED ORDER — FENTANYL CITRATE (PF) 250 MCG/5ML IJ SOLN
INTRAMUSCULAR | Status: AC
  Filled 2017-11-12: qty 5

## 2017-11-12 MED ORDER — SUCCINYLCHOLINE CHLORIDE 200 MG/10ML IV SOSY
PREFILLED_SYRINGE | INTRAVENOUS | Status: AC
  Filled 2017-11-12: qty 10

## 2017-11-12 MED ORDER — BACITRACIN-NEOMYCIN-POLYMYXIN 400-5-5000 EX OINT
TOPICAL_OINTMENT | CUTANEOUS | Status: AC
  Filled 2017-11-12: qty 1

## 2017-11-12 MED ORDER — BACLOFEN 10 MG PO TABS
5.0000 mg | ORAL_TABLET | Freq: Three times a day (TID) | ORAL | Status: DC | PRN
  Administered 2017-11-12 – 2017-11-15 (×3): 5 mg via ORAL
  Filled 2017-11-12 (×3): qty 1

## 2017-11-12 MED ORDER — ONDANSETRON HCL 4 MG/2ML IJ SOLN
INTRAMUSCULAR | Status: AC
  Filled 2017-11-12: qty 2

## 2017-11-12 MED ORDER — FAMOTIDINE IN NACL 20-0.9 MG/50ML-% IV SOLN
20.0000 mg | Freq: Two times a day (BID) | INTRAVENOUS | Status: DC
  Administered 2017-11-12 – 2017-11-14 (×6): 20 mg via INTRAVENOUS
  Filled 2017-11-12 (×6): qty 50

## 2017-11-12 MED ORDER — SUCCINYLCHOLINE CHLORIDE 20 MG/ML IJ SOLN
INTRAMUSCULAR | Status: DC | PRN
  Administered 2017-11-12: 100 mg via INTRAVENOUS

## 2017-11-12 MED ORDER — LIDOCAINE 2% (20 MG/ML) 5 ML SYRINGE
INTRAMUSCULAR | Status: DC | PRN
  Administered 2017-11-12: 80 mg via INTRAVENOUS

## 2017-11-12 MED ORDER — DEXAMETHASONE SODIUM PHOSPHATE 10 MG/ML IJ SOLN
INTRAMUSCULAR | Status: DC | PRN
  Administered 2017-11-12: 10 mg via INTRAVENOUS

## 2017-11-12 MED ORDER — PROMETHAZINE HCL 25 MG/ML IJ SOLN
INTRAMUSCULAR | Status: AC
  Filled 2017-11-12: qty 1

## 2017-11-12 MED ORDER — PROMETHAZINE HCL 25 MG/ML IJ SOLN
6.2500 mg | INTRAMUSCULAR | Status: DC | PRN
  Administered 2017-11-12: 6.25 mg via INTRAVENOUS

## 2017-11-12 SURGICAL SUPPLY — 53 items
BANDAGE ACE 4X5 VEL STRL LF (GAUZE/BANDAGES/DRESSINGS) ×16 IMPLANT
BNDG CONFORM 2 STRL LF (GAUZE/BANDAGES/DRESSINGS) ×4 IMPLANT
BNDG GAUZE ELAST 4 BULKY (GAUZE/BANDAGES/DRESSINGS) ×12 IMPLANT
CORDS BIPOLAR (ELECTRODE) ×4 IMPLANT
COVER SURGICAL LIGHT HANDLE (MISCELLANEOUS) ×4 IMPLANT
CUFF TOURN SGL QUICK 34 (TOURNIQUET CUFF)
CUFF TOURNIQUET SINGLE 18IN (TOURNIQUET CUFF) IMPLANT
CUFF TOURNIQUET SINGLE 24IN (TOURNIQUET CUFF) IMPLANT
CUFF TRNQT CYL 34X4.125X (TOURNIQUET CUFF) IMPLANT
DRSG ADAPTIC 3X8 NADH LF (GAUZE/BANDAGES/DRESSINGS) ×8 IMPLANT
GAUZE PACKING IODOFORM 1/4X15 (GAUZE/BANDAGES/DRESSINGS) ×4 IMPLANT
GAUZE SPONGE 4X4 12PLY STRL (GAUZE/BANDAGES/DRESSINGS) ×16 IMPLANT
GAUZE XEROFORM 1X8 LF (GAUZE/BANDAGES/DRESSINGS) ×8 IMPLANT
GAUZE XEROFORM 5X9 LF (GAUZE/BANDAGES/DRESSINGS) ×8 IMPLANT
GLOVE BIOGEL M 8.0 STRL (GLOVE) IMPLANT
GLOVE BIOGEL PI IND STRL 6.5 (GLOVE) ×4 IMPLANT
GLOVE BIOGEL PI IND STRL 7.0 (GLOVE) ×2 IMPLANT
GLOVE BIOGEL PI INDICATOR 6.5 (GLOVE) ×4
GLOVE BIOGEL PI INDICATOR 7.0 (GLOVE) ×2
GLOVE SS BIOGEL STRL SZ 8 (GLOVE) ×2 IMPLANT
GLOVE SUPERSENSE BIOGEL SZ 8 (GLOVE) ×2
GOWN STRL REUS W/ TWL LRG LVL3 (GOWN DISPOSABLE) ×2 IMPLANT
GOWN STRL REUS W/ TWL XL LVL3 (GOWN DISPOSABLE) ×4 IMPLANT
GOWN STRL REUS W/TWL LRG LVL3 (GOWN DISPOSABLE) ×2
GOWN STRL REUS W/TWL XL LVL3 (GOWN DISPOSABLE) ×4
KIT BASIN OR (CUSTOM PROCEDURE TRAY) ×4 IMPLANT
KIT TURNOVER KIT B (KITS) ×4 IMPLANT
MANIFOLD NEPTUNE II (INSTRUMENTS) ×4 IMPLANT
NEEDLE HYPO 25GX1X1/2 BEV (NEEDLE) IMPLANT
NS IRRIG 1000ML POUR BTL (IV SOLUTION) ×4 IMPLANT
PACK ORTHO EXTREMITY (CUSTOM PROCEDURE TRAY) ×12 IMPLANT
PAD ABD 8X10 STRL (GAUZE/BANDAGES/DRESSINGS) ×8 IMPLANT
PAD ARMBOARD 7.5X6 YLW CONV (MISCELLANEOUS) ×4 IMPLANT
PAD CAST 4YDX4 CTTN HI CHSV (CAST SUPPLIES) ×2 IMPLANT
PADDING CAST COTTON 4X4 STRL (CAST SUPPLIES) ×2
SCRUB BETADINE 4OZ XXX (MISCELLANEOUS) ×4 IMPLANT
SET CYSTO W/LG BORE CLAMP LF (SET/KITS/TRAYS/PACK) ×8 IMPLANT
SOL PREP POV-IOD 4OZ 10% (MISCELLANEOUS) ×8 IMPLANT
SPLINT FIBERGLASS 3X12 (CAST SUPPLIES) ×8 IMPLANT
SPONGE LAP 4X18 RFD (DISPOSABLE) ×4 IMPLANT
SPONGE NEURO XRAY DETECT 1X3 (DISPOSABLE) ×4 IMPLANT
STOCKINETTE SYNTHETIC 6 UNSTER (CAST SUPPLIES) ×4 IMPLANT
STOCKINETTE TUBULAR SYNTH 4IN (CAST SUPPLIES) ×4 IMPLANT
SUT CHROMIC 5 0 P 3 (SUTURE) ×4 IMPLANT
SUT PROLENE 4 0 P 3 18 (SUTURE) ×8 IMPLANT
SWAB CULTURE ESWAB REG 1ML (MISCELLANEOUS) IMPLANT
SYR CONTROL 10ML LL (SYRINGE) IMPLANT
TOWEL OR 17X24 6PK STRL BLUE (TOWEL DISPOSABLE) ×4 IMPLANT
TOWEL OR 17X26 10 PK STRL BLUE (TOWEL DISPOSABLE) ×4 IMPLANT
TUBE CONNECTING 12'X1/4 (SUCTIONS) ×1
TUBE CONNECTING 12X1/4 (SUCTIONS) ×3 IMPLANT
WATER STERILE IRR 1000ML POUR (IV SOLUTION) ×4 IMPLANT
YANKAUER SUCT BULB TIP NO VENT (SUCTIONS) ×4 IMPLANT

## 2017-11-12 NOTE — Anesthesia Postprocedure Evaluation (Signed)
Anesthesia Post Note  Patient: Justin [name]  Procedure(s) Performed: IRRIGATION AND DEBRIDEMENT RIGHT AND LEFT HANDS (Bilateral Arm Lower) IRRIGATION AND DEBRIDEMENT RIGHT FOOT (Right Foot)     Patient location during evaluation: PACU Anesthesia Type: General Level of consciousness: awake and alert Pain management: pain level controlled Vital Signs Assessment: post-procedure vital signs reviewed and stable Respiratory status: spontaneous breathing, nonlabored ventilation, respiratory function stable and patient connected to nasal cannula oxygen Cardiovascular status: blood pressure returned to baseline and stable Postop Assessment: no apparent nausea or vomiting Anesthetic complications: no    Last Vitals:  Vitals:   11/12/17 0409 11/12/17 1300  BP: 125/68 (!) 141/66  Pulse: 75 (!) 110  Resp: 16 15  Temp: 36.6 C 36.5 C  SpO2: 98% 94%    Last Pain:  Vitals:   11/12/17 1300  TempSrc:   PainSc: 0-No pain                 Cecile Hearing

## 2017-11-12 NOTE — Progress Notes (Signed)
PROGRESS NOTE    Justin [name]  YSA:[id number] DOB: 1955-07-20 DOA: 11/10/2017 PCP: [name], MD    Brief Narrative: Justin [name] is a 62 y.o. 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 Presented with being bitten multiple times by his dog. Family states that he was at home when neighbors dogs approached got in a fight with his dog he was trying to separate a fight and his dog attacked him biting him repeatedly on his hands feet side of abdomen. 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  Underwent I and D 8-31. : revision amputation right finger, proximal phalange level. Fasciotomy right arm, left thumb irrigation and debridement extensor apparatus. Dorsal hand irrigation, open treatment of proximal phalange left hand, left ring finger ulnar and radial digital nerve neurolysis. Irrigation and debridement right foot.  Appreciate Dr Cliffton Asters Help.  Continue with IV antibiotics.  Plan to return to OR today  Fentanyl for pain controlled.  Right side, abdomen; abrasion, is edematous, very tender to palpation. CT abdomen negative for abscess. Local care. Evaluated by surgery, no further recommendations.    2-Right ankle pain and hip pain;  X ray negative.   History of pancreatitis, common bile duct stricture.  Follow LFT.  He report mild abdominal pain.  IV fluids. Pain  management Diet as tolerated.  PPI.  LFT , AST normalized, ALT mildly elevated.   Hypokalemia; replaced.   Vomiting;  PRN zofran.    DVT prophylaxis: follow ortho rec.  Code Status: Full code.  Family Communication: Wife at bedside.  Disposition Plan: remain inpatient for IV antibiotics pain management.   Consultants:   Dr Cliffton Asters    Procedures:   ID   Antimicrobials:  Clindamycin ciprofloxacin  Subjective: He report pain has been controlled. Started to vomit while I was in the room. Got medicine on empty stomach. \report mouth pain.  Feels weak.   Objective: Vitals:   11/11/17 1434 11/11/17 2035 11/12/17 0408 11/12/17 0409  BP: (!) 142/78   125/68  Pulse: 81 89  75  Resp: 16 16  16   Temp: 98.3 F (36.8 C) 98 F (36.7 C) 98.9 F (37.2 C) 97.9 F (36.6 C)  TempSrc: Oral Oral Oral Oral  SpO2: 100%   98%  Weight:      Height:        Intake/Output Summary (Last 24 hours) at 11/12/2017 0930 Last data filed at 11/12/2017 0000 Gross per 24 hour  Intake 2661 ml  Output -  Net 2661 ml   Filed Weights   11/10/17 1943  Weight: 70.8 kg    Examination:  General exam: NAD Respiratory system: CTA. Cardiovascular system: S 1, S 2 RRR Gastrointestinal system: BS present, soft, mild tender.  Central nervous system: alert.  Extremities: right ankle with dressing, BL hands with dressing.  Skin: abrasion right side abdomen,    Data Reviewed: I have personally  reviewed following labs and imaging studies  CBC: Recent Labs  Lab 11/10/17 2108 11/10/17 2122 11/11/17 0442 11/12/17 0759  WBC 12.7*  --  10.6* 5.7  NEUTROABS 11.0*  --   --   --   HGB 10.7* 10.5* 9.9* 9.7*  HCT 32.6* 31.0* 30.6* 29.7*  MCV 87.4  --  87.9 87.4  PLT 168  --  169 142*   Basic Metabolic Panel: Recent Labs  Lab 11/10/17 2108 11/10/17 2122 11/11/17 0442 11/12/17 0759  NA 141 142 141 141  K 3.2* 3.3* 3.2* 3.9  CL 108 106 108 111  CO2 25  --  27 23  GLUCOSE 189* 185* 140* 118*   BUN 16 18 13  5*  CREATININE 1.17 1.20 1.02 0.98  CALCIUM 8.5*  --  7.8* 8.3*  MG  --   --  1.7  --   PHOS  --   --  3.1  --    GFR: Estimated Creatinine Clearance: 73.1 mL/min (by C-G formula based on SCr of 0.98 mg/dL). Liver Function Tests: Recent Labs  Lab 11/10/17 2108 11/11/17 0442 11/12/17 0759  AST 16 142* 38  ALT 17 79* 85*  ALKPHOS 52 45 53  BILITOT 0.4 1.1 0.6  PROT 5.6* 5.3* 5.5*  ALBUMIN 3.2* 3.1* 2.8*   No results for input(s): LIPASE, AMYLASE in the last 168 hours. No results for input(s): AMMONIA in the last 168 hours. Coagulation Profile: No results for input(s): INR, PROTIME in the last 168 hours. Cardiac Enzymes: No results for input(s): CKTOTAL, CKMB, CKMBINDEX, TROPONINI in the last 168 hours. BNP (last 3 results) No results for input(s): PROBNP in the last 8760 hours. HbA1C: Recent Labs    11/11/17 0442  HGBA1C 6.3*   CBG: No results for input(s): GLUCAP in the last 168 hours. Lipid Profile: No results for input(s): CHOL, HDL, LDLCALC, TRIG, CHOLHDL, LDLDIRECT in the last 72 hours. Thyroid Function Tests: Recent Labs    11/11/17 0442  TSH 1.670   Anemia Panel: No results for input(s): VITAMINB12, FOLATE, FERRITIN, TIBC, IRON, RETICCTPCT in the last 72 hours. Sepsis Labs: No results for input(s): PROCALCITON, LATICACIDVEN in the last 168 hours.  Recent Results (from the past 240 hour(s))  Surgical pcr screen     Status: None   Collection Time: 11/11/17  9:22 PM  Result Value Ref Range Status   MRSA, PCR NEGATIVE NEGATIVE Final   Staphylococcus aureus NEGATIVE NEGATIVE Final    Comment: (NOTE) The Xpert SA Assay (FDA approved for NASAL specimens in patients 66 years of age and older), is one component of a comprehensive surveillance program. It is not intended to diagnose infection nor to guide or monitor treatment. Performed at [facility], [address] [place], [place]          Radiology Studies: Dg Ribs  Unilateral Right  Result Date: 11/11/2017 CLINICAL DATA:  Attacked by a pit bull yesterday.  Right rib pain. EXAM: RIGHT RIBS - 2 VIEW COMPARISON:  None. FINDINGS: No fracture or other bone lesions are seen involving the ribs. No hemothorax or pneumothorax. Atelectasis in both lower lobes. IMPRESSION: 1. No evident fracture. 2. Bilateral lower lobe atelectasis. Electronically Signed   By: 01/11/2018 M.D.   On: 11/11/2017 13:14   Dg Ankle Complete Right  Result Date: 11/11/2017 CLINICAL DATA:  Posttraumatic right ankle pain.  Initial encounter. EXAM: RIGHT ANKLE - COMPLETE 3+ VIEW COMPARISON:  None. FINDINGS: Lateral soft tissue swelling. There is no  evidence of fracture, dislocation, or joint effusion. Heel spur. Arterial calcification IMPRESSION: Soft tissue swelling without fracture. Electronically Signed   By: Marnee Spring M.D.   On: 11/11/2017 13:15   Ct Abdomen Pelvis W Contrast  Result Date: 11/11/2017 CLINICAL DATA:  Penetrating abdominal trauma. Attacked by pit bull. Right flank injury. EXAM: CT ABDOMEN AND PELVIS WITH CONTRAST TECHNIQUE: Multidetector CT imaging of the abdomen and pelvis was performed using the standard protocol following bolus administration of intravenous contrast. CONTRAST:  ISOVUE-300 IOPAMIDOL (ISOVUE-300) INJECTION 61% COMPARISON:  07/23/2017 FINDINGS: Lower chest:  Minimal atelectasis.  No evidence of injury Hepatobiliary: Chronic intrahepatic biliary duct dilatation. Cholecystectomy.No evidence of liver injury. Pancreas: Chronic volume loss and decreased enhancement in the midline pancreas where the pancreas is indistinguishable from the stomach. Patient has history of pancreatitis and pseudocyst drainage. A cyst was seen in this location in 2017. Proximal to this level the main pancreatic duct is chronically dilated to 3-4 mm. Annular pancreas. No evidence of acute pancreatitis. Spleen: Unremarkable. Adrenals/Urinary Tract: Negative adrenals. Small bilateral  renal calculi measuring up to 4 mm on the left. Unremarkable bladder. Stomach/Bowel: No obstruction. No appendicitis. Colonic diverticulosis. 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 No acute arterial finding. No mass or adenopathy. Reproductive:No pathologic findings. Other: No ascites or pneumoperitoneum. Shallow wide necked periumbilical hernia containing fat and nonobstructed small bowel. Musculoskeletal: Stranding in the right flank without collection. Advanced lower lumbar facet arthropathy with L4-5 and L3-4 degenerative disc narrowing. No acute osseous finding. IMPRESSION: 1. Soft tissue injury in the right flank without evidence of intraperitoneal or intrathoracic extension. Negative for fracture. 2. History of pancreatitis and pseudocyst with unchanged scar-like appearance in the midline pancreatic body with proximal duct dilatation. 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 3. Annular pancreas. 4. Chronic intrahepatic bile duct dilatation. 5. Nephrolithiasis and colonic diverticulosis. Electronically Signed   By: Marnee Spring M.D.   On: 11/11/2017 21:36   Dg Hip Unilat With Pelvis 2-3 Views Right  Result Date: 11/11/2017 CLINICAL DATA:  62 year old male status post dog attack EXAM: DG HIP (WITH OR WITHOUT PELVIS) 2-3V RIGHT COMPARISON:  None. FINDINGS: There is no evidence of hip fracture or dislocation. There is no evidence of arthropathy or other focal bone abnormality. IMPRESSION: Negative. Electronically Signed   By: Malachy Moan M.D.   On: 11/11/2017 13:04        Scheduled Meds: . bacitracin   Topical BID  . clonazePAM  0.5 mg Oral QHS  . DULoxetine  60 mg Oral q morning - 10a  . gabapentin  600 mg Oral TID  . levothyroxine  112 mcg Oral QAC breakfast  . lipase/protease/amylase  144,000 Units Oral TID WC  . pantoprazole  40 mg Oral  BID  . polyethylene glycol  17 g Oral Daily  . tamsulosin  0.4 mg Oral BID  . tiZANidine  4 mg Oral TID   Continuous Infusions: . sodium chloride 100 mL/hr at 11/12/17 0239  . ciprofloxacin 400 mg (11/12/17 0835)  . clindamycin (CLEOCIN) IV 600 mg (11/12/17 0510)     LOS: 2 days    Time spent:35 minutes     Alba Cory, MD [facility] Pager [phone]  If 7PM-7AM, please contact night-coverage [email or web address] Password [email or web address] 11/12/2017, 9:30 AM

## 2017-11-12 NOTE — Progress Notes (Signed)
Pt vomiting and dry hiving; prn zofran given. Pt picked up by OR staff to be transported off unit to OR for surgery. Pt off unit via bed with IV intact and transfusing. Dionne Bucy RN

## 2017-11-12 NOTE — Transfer of Care (Signed)
Immediate Anesthesia Transfer of Care Note  Patient: Justin [name]  Procedure(s) Performed: IRRIGATION AND DEBRIDEMENT RIGHT AND LEFT HANDS (Bilateral Arm Lower) IRRIGATION AND DEBRIDEMENT RIGHT FOOT (Right Foot)  Patient Location: PACU  Anesthesia Type:General  Level of Consciousness: awake and patient cooperative  Airway & Oxygen Therapy: Patient Spontanous Breathing  Post-op Assessment: Report given to RN and Post -op Vital signs reviewed and stable  Post vital signs: Reviewed and stable  Last Vitals:  Vitals Value Taken Time  BP 141/66 11/12/2017  1:00 PM  Temp    Pulse 115 11/12/2017  1:01 PM  Resp 12 11/12/2017  1:01 PM  SpO2 93 % 11/12/2017  1:01 PM  Vitals shown include unvalidated device data.  Last Pain:  Vitals:   11/12/17 0409  TempSrc: Oral  PainSc:       Patients Stated Pain Goal: 3 (11/12/17 0239)  Complications: No apparent anesthesia complications

## 2017-11-12 NOTE — Plan of Care (Signed)
  Problem: Education: Goal: Knowledge of General Education information will improve Description: Including pain rating scale, medication(s)/side effects and non-pharmacologic comfort measures Outcome: Progressing   Problem: Clinical Measurements: Goal: Ability to maintain clinical measurements within normal limits will improve Outcome: Progressing Goal: Will remain free from infection Outcome: Progressing   

## 2017-11-12 NOTE — Progress Notes (Signed)
Patient ID: Justin [name], male   DOB: [birth year], 62 y.o.   MRN: [id number]    2 Days Post-Op  Subjective: Pt appropriately seems sad.  Having pain in right flank.  Going to OR today for more surgery on his hand and foot he thinks.  Objective: Vital signs in last 24 hours: Temp:  [97.9 F (36.6 C)-98.9 F (37.2 C)] 97.9 F (36.6 C) (09/02 0409) Pulse Rate:  [75-89] 75 (09/02 0409) Resp:  [16] 16 (09/02 0409) BP: (125-142)/(68-78) 125/68 (09/02 0409) SpO2:  [98 %-100 %] 98 % (09/02 0409)    Intake/Output from previous day: 09/01 0701 - 09/02 0700 In: 3021 [P.O.:840; I.V.:1477.8; IV Piggyback:703.2] Out: -  Intake/Output this shift: No intake/output data recorded.  PE: Gen: NAD Heart: regular Lungs: CTAB Abd: soft, tender over right upper quadrant flank wound with ecchymosis surrounding this area.  Wounds appear fairly superficial with no evidence of infection. Ext: multiple dressings in place on B hands and foot  Lab Results:  Recent Labs    11/11/17 0442 11/12/17 0759  WBC 10.6* 5.7  HGB 9.9* 9.7*  HCT 30.6* 29.7*  PLT 169 142*   BMET Recent Labs    11/11/17 0442 11/12/17 0759  NA 141 141  K 3.2* 3.9  CL 108 111  CO2 27 23  GLUCOSE 140* 118*  BUN 13 5*  CREATININE 1.02 0.98  CALCIUM 7.8* 8.3*   PT/INR No results for input(s): LABPROT, INR in the last 72 hours. CMP     Component Value Date/Time   NA 141 11/12/2017 0759   K 3.9 11/12/2017 0759   CL 111 11/12/2017 0759   CO2 23 11/12/2017 0759   GLUCOSE 118 (H) 11/12/2017 0759   BUN 5 (L) 11/12/2017 0759   CREATININE 0.98 11/12/2017 0759   CALCIUM 8.3 (L) 11/12/2017 0759   PROT 5.5 (L) 11/12/2017 0759   ALBUMIN 2.8 (L) 11/12/2017 0759   AST 38 11/12/2017 0759   ALT 85 (H) 11/12/2017 0759   ALKPHOS 53 11/12/2017 0759   BILITOT 0.6 11/12/2017 0759   GFRNONAA >60 11/12/2017 0759   GFRAA >60 11/12/2017 0759   Lipase     Component Value Date/Time   LIPASE 33 04/20/2017 1011        Studies/Results: Dg Ribs Unilateral Right  Result Date: 11/11/2017 CLINICAL DATA:  Attacked by a pit bull yesterday.  Right rib pain. EXAM: RIGHT RIBS - 2 VIEW COMPARISON:  None. FINDINGS: No fracture or other bone lesions are seen involving the ribs. No hemothorax or pneumothorax. Atelectasis in both lower lobes. IMPRESSION: 1. No evident fracture. 2. Bilateral lower lobe atelectasis. Electronically Signed   By: Marnee Spring M.D.   On: 11/11/2017 13:14   Dg Ankle Complete Right  Result Date: 11/11/2017 CLINICAL DATA:  Posttraumatic right ankle pain.  Initial encounter. EXAM: RIGHT ANKLE - COMPLETE 3+ VIEW COMPARISON:  None. FINDINGS: Lateral soft tissue swelling. There is no evidence of fracture, dislocation, or joint effusion. Heel spur. Arterial calcification IMPRESSION: Soft tissue swelling without fracture. Electronically Signed   By: Marnee Spring M.D.   On: 11/11/2017 13:15   Ct Abdomen Pelvis W Contrast  Result Date: 11/11/2017 CLINICAL DATA:  Penetrating abdominal trauma. Attacked by pit bull. Right flank injury. EXAM: CT ABDOMEN AND PELVIS WITH CONTRAST TECHNIQUE: Multidetector CT imaging of the abdomen and pelvis was performed using the standard protocol following bolus administration of intravenous contrast. CONTRAST:  ISOVUE-300 IOPAMIDOL (ISOVUE-300) INJECTION 61% COMPARISON:  07/23/2017 FINDINGS: Lower chest:  Minimal atelectasis.  No evidence of injury Hepatobiliary: Chronic intrahepatic biliary duct dilatation. Cholecystectomy.No evidence of liver injury. Pancreas: Chronic volume loss and decreased enhancement in the midline pancreas where the pancreas is indistinguishable from the stomach. Patient has history of pancreatitis and pseudocyst drainage. A cyst was seen in this location in 2017. Proximal to this level the main pancreatic duct is chronically dilated to 3-4 mm. Annular pancreas. No evidence of acute pancreatitis. Spleen: Unremarkable. Adrenals/Urinary Tract:  Negative adrenals. Small bilateral renal calculi measuring up to 4 mm on the left. Unremarkable bladder. Stomach/Bowel: No obstruction. No appendicitis. Colonic diverticulosis. 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 No acute arterial finding. No mass or adenopathy. Reproductive:No pathologic findings. Other: No ascites or pneumoperitoneum. Shallow wide necked periumbilical hernia containing fat and nonobstructed small bowel. Musculoskeletal: Stranding in the right flank without collection. Advanced lower lumbar facet arthropathy with L4-5 and L3-4 degenerative disc narrowing. No acute osseous finding. IMPRESSION: 1. Soft tissue injury in the right flank without evidence of intraperitoneal or intrathoracic extension. Negative for fracture. 2. History of pancreatitis and pseudocyst with unchanged scar-like appearance in the midline pancreatic body with proximal duct dilatation. 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 3. Annular pancreas. 4. Chronic intrahepatic bile duct dilatation. 5. Nephrolithiasis and colonic diverticulosis. Electronically Signed   By: Marnee Spring M.D.   On: 11/11/2017 21:36   Dg Hip Unilat With Pelvis 2-3 Views Right  Result Date: 11/11/2017 CLINICAL DATA:  62 year old male status post dog attack EXAM: DG HIP (WITH OR WITHOUT PELVIS) 2-3V RIGHT COMPARISON:  None. FINDINGS: There is no evidence of hip fracture or dislocation. There is no evidence of arthropathy or other focal bone abnormality. IMPRESSION: Negative. Electronically Signed   By: Malachy Moan M.D.   On: 11/11/2017 13:04    Anti-infectives: Anti-infectives (From admission, onward)   Start     Dose/Rate Route Frequency Ordered Stop   11/11/17 0800  ciprofloxacin (CIPRO) IVPB 400 mg     400 mg 200 mL/hr over 60 Minutes Intravenous 2 times daily 11/11/17 0137      11/11/17 0400  clindamycin (CLEOCIN) IVPB 600 mg     600 mg 100 mL/hr over 30 Minutes Intravenous Every 8 hours 11/11/17 0137     11/10/17 2015  ciprofloxacin (CIPRO) IVPB 400 mg     400 mg 200 mL/hr over 60 Minutes Intravenous  Once 11/10/17 2004 11/10/17 2114   11/10/17 2015  clindamycin (CLEOCIN) IVPB 600 mg     600 mg 100 mL/hr over 30 Minutes Intravenous  Once 11/10/17 2004 11/10/17 2049       Assessment/Plan Dog Bite  Right flank wound - CT negative for any deeper injury.  Bacitracin ointment to this and cover with dry dressing.  No further intervention. Multiple extremity injuries - defer to ortho and primary service FEN - NPO for OR VTE - SCDs ID - clindamycin/Cipro  We will sign off as there are no further needs from our standpoint.   LOS: 2 days    Letha Cape , [facility] 11/12/2017, 9:16 AM Pager: [phone]

## 2017-11-12 NOTE — Progress Notes (Signed)
Pt back to room from OR; pt alert and verbally responsive; bilateral hands and RLE has clean, dry and intact compression dsg with no active bleeding or stain noted. Pt VSS; IV intact and transfusing. Pt sleeping with call light within reach. Will continue to closely monitor. Dionne Bucy RN

## 2017-11-12 NOTE — Op Note (Signed)
11/12/2017  Operative note/procedure  Preoperative diagnosis-status post multiple extremity injuries secondary to pit bull mauling.  Patient is status post irrigation debridement right foot, status post I&D multiple wounds about the left hand including open laceration to the ring finger, open laceration to the PIP joint of the middle finger, multiple thumb and index finger as well as hand bite injuries.  Patient is notable for right hand ring finger amputation completion/revision after the dog amputated the finger.  Volar forearm is undergone irrigation and debridement as well secondary to a large wound.  Patient presents for repeat irrigation and debridement.  Patient has been on Cipro and clindamycin due to penicillin allergy.  Postop diagnosis-same  Operative procedure-#1 irrigation and debridement skin subcutaneous tissue and muscle with flexor tenosynovectomy tenolysis right forearm #2 left thumb irrigation and debridement skin subcutaneous tissue  #3 left hand irrigation and debridement skin subcutaneous tissue and muscle this was an excisional debridement x5 wounds #4 middle finger PIP arthrotomy synovectomy and loose closure with irrigation and debridement left middle finger-we also performed a extensor tendon tenosynovectomy and tenolysis in this region. #5 left ring finger tenolysis tenosynovectomy of the FDP and FDS tendons with primary closure wound-this was a distinct irrigation debridement procedure and tendon debridement procedure with closure of the wound #6 irrigation and debridement skin subcutaneous tissue and muscle right foot 5 different puncture wounds   Gyanna Jarema MD  Anesthesia General  Estimated blood loss minimal  Description of the procedure-patient was taken to the operative theater he was consented he underwent a general anesthetic.  Following this dressings were removed.  I checked all the dressings about the right hand, left hand and right foot.  There is no infectious  problems noted.  At this time we performed pre-scrub with Hibiclens followed by attainment surgical Betadine scrub and paint about all 3 sites.  Once this was complete the patient's right hand and arm were dressed with irrigation debridement of skin subtenons tissue and muscle.  This was an excisional debridement with copious amounts of fluid placed in the area.  Following this tenolysis and tenosynovectomy of the flexor apparatus was accomplished.  Identified the median nerve it was stable.  I should note that the ring finger amputation looked excellent.  There is no complications.  This wound was ultimately dressed with Neosporin Adaptic Xeroform and a wick was placed in the wound.  We will begin wet-to-dry dressing changes to the volar forearm tomorrow.  Once this was complete attention was turned towards the left hand.  Irrigation and debridement of the left thumb skin subcutaneous tissue was accomplished without difficulty.  The patient tolerated this well.  This wound was packed with iodoform gauze.  Following this 5 different sites about the dorsal hand underwent debridement with excisional debridement utilizing curette knife and scissor.  This was an excisional debridement less than 5 cm and these wounds were all packs.  Copious amounts of saline were placed through a 3 L bag of cystoscopy tubing.  Following this irrigation and debridement of an open middle finger PIP joint with arthrotomy sign of ectomy and extensor tendon tenolysis was accomplished.  There were no complicating features.  Patient tolerated this well.  Following this the left ring finger underwent irrigation debridement and a flexor tendon tenolysis tenosynovectomy.  This is performed without difficulty removing any devitalized pre-necrotic skin tissue.  Following this the wound was closed with 4-0 Prolene.  The patient had a chromic stitch placed about the middle finger PIP joint so as  to not allow the joint to be exposed.   The patient small finger was devoid of any significant injury fortunately.  This hand was ultimately treated with Adaptic Xeroform and a large bulky dressing.  Following this attention was directed towards the foot where irrigation and debridement of skin and subcutaneous tissue and muscle was accomplished through 5 different sites this was an excisional debridement of the different extremity and was a distinct and separate portion of the operative procedure these wounds were all packed after irrigation was accomplished.  The patient tolerated procedure well there were no complications.  We will monitor the patient's condition very closely.  I would recommend continuing IV antibiotics for 48 hours.  We are going to begin PT hydrotherapy Wednesday and begin wet-to-dry dressing changes.  I feel that hopefully once he is able to demonstrate the ability to perform the dressing changes by himself and with the help of his family we can transition him to home.  I am not planning any further washouts at this time given the good quality of the condition we saw.   All questions have been encouraged and answered.  Jadien Lehigh MD

## 2017-11-12 NOTE — Anesthesia Procedure Notes (Signed)
Procedure Name: Intubation Date/Time: 11/12/2017 11:35 AM Performed by: Rosiland Oz, CRNA Pre-anesthesia Checklist: Patient identified, Emergency Drugs available, Suction available, Patient being monitored and Timeout performed Patient Re-evaluated:Patient Re-evaluated prior to induction Oxygen Delivery Method: Circle system utilized Preoxygenation: Pre-oxygenation with 100% oxygen Induction Type: IV induction and Rapid sequence Laryngoscope Size: Miller and 3 Grade View: Grade I Tube type: Oral Tube size: 7.5 mm Number of attempts: 1 Airway Equipment and Method: Stylet Placement Confirmation: ETT inserted through vocal cords under direct vision,  positive ETCO2 and breath sounds checked- equal and bilateral Secured at: 22 cm Tube secured with: Tape Dental Injury: Teeth and Oropharynx as per pre-operative assessment

## 2017-11-12 NOTE — Anesthesia Postprocedure Evaluation (Signed)
Anesthesia Post Note  Patient: Justin [name]  Procedure(s) Performed: IRRIGATION AND DEBRIDEMENT, BILATERAL UPPER HANDS AND RIGHT FOOT. REVISION AMPUTATION RIGHT RING FINGER. (Bilateral )     Patient location during evaluation: PACU Anesthesia Type: General Level of consciousness: awake and alert, awake and oriented Pain management: pain level controlled Vital Signs Assessment: post-procedure vital signs reviewed and stable Respiratory status: spontaneous breathing, nonlabored ventilation and respiratory function stable Cardiovascular status: blood pressure returned to baseline and stable Postop Assessment: no apparent nausea or vomiting Anesthetic complications: no    Last Vitals:  Vitals:   11/12/17 0408 11/12/17 0409  BP:  125/68  Pulse:  75  Resp:  16  Temp: 37.2 C 36.6 C  SpO2:  98%    Last Pain:  Vitals:   11/12/17 0409  TempSrc: Oral  PainSc:                  Cecile Hearing

## 2017-11-12 NOTE — Anesthesia Preprocedure Evaluation (Signed)
Anesthesia Evaluation  Patient identified by MRN, date of birth, ID band Patient awake    Reviewed: Allergy & Precautions, NPO status , Patient's Chart, lab work & pertinent test results  Airway Mallampati: II  TM Distance: >3 FB Neck ROM: Full    Dental  (+) Teeth Intact, Dental Advisory Given   Pulmonary asthma , former smoker,    Pulmonary exam normal breath sounds clear to auscultation       Cardiovascular hypertension, + CAD and + Past MI  Normal cardiovascular exam Rhythm:Regular Rate:Normal     Neuro/Psych PSYCHIATRIC DISORDERS Depression CVA, Residual Symptoms    GI/Hepatic GERD  ,(+)     substance abuse  alcohol use,   Endo/Other  Hypothyroidism   Renal/GU negative Renal ROS     Musculoskeletal  (+) Fibromyalgia -  Abdominal   Peds  Hematology  (+) Blood dyscrasia (Thrombocytopenia), anemia ,   Anesthesia Other Findings Day of surgery medications reviewed with the patient.  Reproductive/Obstetrics                             Anesthesia Physical  Anesthesia Plan  ASA: III  Anesthesia Plan: General   Post-op Pain Management:    Induction: Intravenous  PONV Risk Score and Plan: 3 and Midazolam, Dexamethasone and Ondansetron  Airway Management Planned: Oral ETT  Additional Equipment:   Intra-op Plan:   Post-operative Plan: Extubation in OR  Informed Consent: I have reviewed the patients History and Physical, chart, labs and discussed the procedure including the risks, benefits and alternatives for the proposed anesthesia with the patient or authorized representative who has indicated his/her understanding and acceptance.   Dental advisory given  Plan Discussed with: CRNA  Anesthesia Plan Comments:         Anesthesia Quick Evaluation

## 2017-11-12 NOTE — Progress Notes (Signed)
OT Cancellation Note  Patient Details Name: Justin [name] MRN: [id number] DOB: [birth year]   Cancelled Treatment:    Reason Eval/Treat Not Completed: Patient at procedure or test/ unavailable. Pt off unit in OR. OT will check back as able to progress with plan of care.  Doristine Section, MS OTR/L  Pager: [phone]   Deaira Leckey A Hurley Blevins 11/12/2017, 10:17 AM

## 2017-11-13 ENCOUNTER — Encounter (HOSPITAL_COMMUNITY): Payer: Self-pay | Admitting: Orthopedic Surgery

## 2017-11-13 LAB — COMPREHENSIVE METABOLIC PANEL
ALK PHOS: 57 U/L (ref 38–126)
ALT: 72 U/L — AB (ref 0–44)
AST: 25 U/L (ref 15–41)
Albumin: 2.8 g/dL — ABNORMAL LOW (ref 3.5–5.0)
Anion gap: 6 (ref 5–15)
BUN: 6 mg/dL — ABNORMAL LOW (ref 8–23)
CALCIUM: 8.6 mg/dL — AB (ref 8.9–10.3)
CHLORIDE: 110 mmol/L (ref 98–111)
CO2: 24 mmol/L (ref 22–32)
CREATININE: 0.97 mg/dL (ref 0.61–1.24)
Glucose, Bld: 211 mg/dL — ABNORMAL HIGH (ref 70–99)
Potassium: 3.8 mmol/L (ref 3.5–5.1)
Sodium: 140 mmol/L (ref 135–145)
Total Bilirubin: 0.5 mg/dL (ref 0.3–1.2)
Total Protein: 5.7 g/dL — ABNORMAL LOW (ref 6.5–8.1)

## 2017-11-13 LAB — CBC
HCT: 26.8 % — ABNORMAL LOW (ref 39.0–52.0)
Hemoglobin: 8.7 g/dL — ABNORMAL LOW (ref 13.0–17.0)
MCH: 28.3 pg (ref 26.0–34.0)
MCHC: 32.5 g/dL (ref 30.0–36.0)
MCV: 87.3 fL (ref 78.0–100.0)
PLATELETS: 146 10*3/uL — AB (ref 150–400)
RBC: 3.07 MIL/uL — AB (ref 4.22–5.81)
RDW: 14.7 % (ref 11.5–15.5)
WBC: 6.7 10*3/uL (ref 4.0–10.5)

## 2017-11-13 MED ORDER — SENNA 8.6 MG PO TABS
1.0000 | ORAL_TABLET | Freq: Every day | ORAL | Status: DC
  Administered 2017-11-13 – 2017-11-14 (×2): 8.6 mg via ORAL
  Filled 2017-11-13 (×2): qty 1

## 2017-11-13 MED ORDER — BISACODYL 5 MG PO TBEC
5.0000 mg | DELAYED_RELEASE_TABLET | Freq: Every day | ORAL | Status: DC | PRN
  Administered 2017-11-15: 5 mg via ORAL
  Filled 2017-11-13: qty 1

## 2017-11-13 MED ORDER — FERROUS SULFATE 325 (65 FE) MG PO TABS
325.0000 mg | ORAL_TABLET | Freq: Two times a day (BID) | ORAL | Status: DC
  Administered 2017-11-13 – 2017-11-16 (×6): 325 mg via ORAL
  Filled 2017-11-13 (×6): qty 1

## 2017-11-13 NOTE — Progress Notes (Signed)
Physical Therapy Treatment Patient Details Name: Justin [name] MRN: [id number] DOB: 12-09-1955 Today's Date: 11/13/2017    History of Present Illness 62 y.o. 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 Presenting with multiple dog bites to BUEs and RLE. Underwent (11/11/17) extensive surgery to BUEs and RLE including: debridement and revision amputation right ring finger at the proximal phalanx level, fasciotomy right forearm, left thumb irrigation debridement extensor apparatus, dorsal hand irrigation debridement, proximal phalanx fracture left hand, left ring finger ulnar and radial digital nerve neuro lysis,right middle finger PIP joint extensor tendon tenolysis tenosynovectomy, debridement right foot, and median nerve neuro lysis right forearm.    PT Comments    Pt progressing towards goals, negotiating stairs with gross min - mod A with Bil platform RW . Pt demonstrating increased motivation, but presenting with decreased safety awareness throughout session. Pt requesting to manage stairs without assistance on second trial. Educated on the importance of utilizing assistance available at d/c to maximize safety with mobility, especially when ascending/descending stairs. Pt left in chair with all needs met at end of session. Will continue to follow acutely.     Follow Up Recommendations  Outpatient PT;Supervision for mobility/OOB     Equipment Recommendations  Other (comment)(Bilateral platform rolling walker )    Recommendations for Other Services       Precautions / Restrictions Precautions Precautions: Fall Restrictions Weight Bearing Restrictions: Yes RUE Weight Bearing: Weight bearing as tolerated LUE Weight Bearing: Weight bearing as tolerated RLE Weight Bearing: Weight bearing as tolerated    Mobility  Bed  Mobility Overal bed mobility: Needs Assistance Bed Mobility: Supine to Sit     Supine to sit: Supervision     General bed mobility comments: Attempted to lower HOB, but pt began elevating trunk out of supine. Pt able to perform supine>sit and scoot EOB without any assist. Supervision for safety.    Transfers Overall transfer level: Needs assistance Equipment used: Bilateral platform walker Transfers: Sit to/from Stand Sit to Stand: Min guard         General transfer comment: Increased bed height to simulate home environment. Pt eager to stand as soon as platform walker in reach. Min cues for safety and technique.  Ambulation/Gait Ambulation/Gait assistance: Min guard;Supervision Gait Distance (Feet): 400 Feet Assistive device: Bilateral platform walker Gait Pattern/deviations: Step-through pattern;Decreased weight shift to right;Antalgic;Trunk flexed Gait velocity: decreased Gait velocity interpretation: <1.31 ft/sec, indicative of household ambulator General Gait Details: Min G initially progressing to supervision for safety. Pt reporting 10/10 pain when shifting weight onto RLE, but eager to continue ambulating without any rest breaks. Min cues for breathing throughout as pt holds breath secondary to pain. Pt able to grip R handle of platform RW when ambulating, but unable to grip L handle as L hand is fully wrapped in ace bandages.    Stairs Stairs: Yes Stairs assistance: Mod assist;Min assist Stair Management: Step to pattern;Forwards;Backwards;With walker Number of Stairs: 4 General stair comments: Pt ascended step backwards with stronger LE leading and descended steps forwards with injured LE leading with use of platform walker. Performed on highest steps in therapy gym as pt's spouse reports steps at home are fairly high. Multimodal cues required for sequencing with walker. VCs for foot positioning with descent. Pt initially Mod A for management of walker, steadying, and  physical assist. Upon second trial pt progressed to  min A for management of walker and steadying as pt eager to negotiate stairs and manage walker by himself. Pt required cues for safety awareness.   Wheelchair Mobility    Modified Rankin (Stroke Patients Only)       Balance Overall balance assessment: Needs assistance Sitting-balance support: No upper extremity supported;Feet supported Sitting balance-Leahy Scale: Good     Standing balance support: Bilateral upper extremity supported;During functional activity Standing balance-Leahy Scale: Poor Standing balance comment: BIL UE support required for static and dynamic standing activities                             Cognition Arousal/Alertness: Awake/alert Behavior During Therapy: WFL for tasks assessed/performed Overall Cognitive Status: Within Functional Limits for tasks assessed                                        Exercises      General Comments General comments (skin integrity, edema, etc.): Wife present and engaged throughout session      Pertinent Vitals/Pain Pain Assessment: 0-10 Pain Score: 10-Worst pain ever(RLE) Pain Location: RLE, BUEs, and back Pain Descriptors / Indicators: Aching;Grimacing;Guarding;Discomfort;Tingling;Burning Pain Intervention(s): Limited activity within patient's tolerance;Monitored during session;Repositioned    Home Living                      Prior Function            PT Goals (current goals can now be found in the care plan section) Acute Rehab PT Goals Patient Stated Goal: "Throw ball with grandson" PT Goal Formulation: With patient/family Time For Goal Achievement: 11/25/17 Potential to Achieve Goals: Good Progress towards PT goals: Progressing toward goals    Frequency    Min 4X/week      PT Plan Current plan remains appropriate    Co-evaluation              AM-PAC PT "6 Clicks" Daily Activity  Outcome Measure   Difficulty turning over in bed (including adjusting bedclothes, sheets and blankets)?: A Little Difficulty moving from lying on back to sitting on the side of the bed? : A Little Difficulty sitting down on and standing up from a chair with arms (e.g., wheelchair, bedside commode, etc,.)?: A Little Help needed moving to and from a bed to chair (including a wheelchair)?: A Little Help needed walking in hospital room?: A Little Help needed climbing 3-5 steps with a railing? : A Little 6 Click Score: 18    End of Session Equipment Utilized During Treatment: Gait belt Activity Tolerance: Patient tolerated treatment well Patient left: in chair;with call bell/phone within reach;with family/visitor present Nurse Communication: Mobility status PT Visit Diagnosis: Other abnormalities of gait and mobility (R26.89);Pain;Difficulty in walking, not elsewhere classified (R26.2) Pain - Right/Left: Right Pain - part of body: Arm;Leg;Hand     Time: 6314-9702 PT Time Calculation (min) (ACUTE ONLY): 29 min  Charges:  $Gait Training: 23-37 mins                     Einar Crow, [place]  Student Physical Therapist Acute Rehab [phone]    Einar Crow 11/13/2017, 2:44 PM

## 2017-11-13 NOTE — Progress Notes (Signed)
Orthopedic Tech Progress Note Patient Details:  Justin [name] November 24, 1955 [id number]  Ortho Devices Type of Ortho Device: Postop shoe/boot Ortho Device/Splint Interventions: Application   Post Interventions Patient Tolerated: Well Instructions Provided: Care of device   Saul Fordyce 11/13/2017, 12:39 PM

## 2017-11-13 NOTE — Progress Notes (Signed)
Patient ID: Justin [name], male   DOB: [birth year], 62 y.o.   MRN: [id number] Patient looks really good at bedside.  All wounds are stable.  He is status post irrigation debridement of multiple areas about the right and left hands as well as right foot.  He and I discussed these issues in great detail.  At present time he has good early range of motion and no complications.  I discussed with patient all issues.  Moving forward I would like for him to have a regime of physical therapy for [facility] to the affected areas and to teach wet-to-dry changes to transition to home.  I will order PT for [facility] and a regime of placing wick gauze/wet-to-dry in the exposed areas.  I will make sure his family can demonstrate competency and then discharge him to home with outpatient antibiotics Cipro 500 twice daily and clindamycin as directed for 2 weeks minimum.  I will be out of town beginning tomorrow due to a scheduled conference but feel that he is stable to be discharged wants he can demonstrate competency with the wound care.  Once this is complete I will have my office arrange follow-up so that we can begin the dressing supervision through our outpatient clinic with physical therapy.  Once again I will order the therapy/PT hydrotherapy for tomorrow see how he does and we will transition him to home once competency is demonstrated and then follow him up in the office early next week.  Today he looks great.  He is made a dramatic improvement.  [name] MD-cell [phone]

## 2017-11-13 NOTE — Progress Notes (Signed)
Called by health dept- pt's dog had all vaccines.  They are sending brain tissue for rabies testing, result in 10 days.  Given that dog has gotten his vaccines, will hold on rabies prophylactic for pt.

## 2017-11-13 NOTE — Care Management Important Message (Signed)
Important Message  Patient Details  Name: Justin [name] MRN: [id number] Date of Birth: [birth year]   Medicare Important Message Given:  Yes    Arayla Kruschke Stefan Church 11/13/2017, 4:09 PM

## 2017-11-13 NOTE — Progress Notes (Signed)
OT Cancellation Note  Patient Details Name: Justin [name] MRN: [id number] DOB: Apr 04, 1955   Cancelled Treatment:    Reason Eval/Treat Not Completed: Other (comment); pt sleeping soundly upon entering room, spouse/family present and reports pt has been up with PT earlier, been up to bathroom a few times today. Will follow up for OT treatment as schedule permits.  Marcy Siren, OT Pager [phone] 11/13/2017   Orlando Penner 11/13/2017, 4:11 PM

## 2017-11-13 NOTE — Progress Notes (Signed)
PROGRESS NOTE    Justin [name]  KWI:[id number] DOB: 1955-08-16 DOA: 11/10/2017 PCP: [name], MD    Brief Narrative: Justin [name] is a 62 y.o. 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 Presented with being bitten multiple times by his dog. Family states that he was at home when neighbors dogs approached got in a fight with his dog he was trying to separate a fight and his dog attacked him biting him repeatedly on his hands feet side of abdomen. Denied alcohol was involved.  He was evaluated in the emergency department has been seen by Gramig who will take patient to OR Started on Cipro and clindamycin and given fentanyl  for pain with good pain control  Patient underwent I and D on 8-31 and 9-02 by Dr Cliffton Asters. Plan is to continue with IV antibiotics for 48 hours more and PT hydrotherapy on wednesday.   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  Underwent I and D 8-31. : revision amputation right finger, proximal phalange level. Fasciotomy right arm, left thumb irrigation and debridement extensor apparatus. Dorsal hand irrigation, open treatment of proximal phalange left hand, left ring finger ulnar and radial digital nerve neurolysis. Irrigation and debridement right foot.  Appreciate Dr Cliffton Asters Help.  Continue with IV antibiotics.  OR for I and D again on 9-02. Fentanyl for pain controlled.  Right side, abdomen; abrasion, is edematous, very tender to palpation. CT abdomen negative for abscess. Local care. Evaluated by surgery, no further recommendations.    2-Right ankle pain and hip  pain;  X ray negative.   3-History of pancreatitis, common bile duct stricture.  Follow LFT. Trending down.  He report mild abdominal pain.  IV fluids. Pain management Diet as tolerated.  PPI.  LFT , AST normalized, ALT mildly elevated.   Chronic pain;  On Tizanidine at home . Unable to continue this medication due to interaction with ciprofloxacin.  Will order baclofen PRN instead.   Hypokalemia; replaced.   Vomiting;  PRN zofran.   Constipation; miralax. Senna.    DVT prophylaxis: follow ortho rec.  Code Status: Full code.  Family Communication: Wife at bedside.  Disposition Plan: remain inpatient for IV antibiotics pain management.   Consultants:   Dr Cliffton Asters    Procedures:   ID   Antimicrobials:  Clindamycin ciprofloxacin  Subjective: He is in better spirit. Wants to go home, explain him he needs to remain in the hospital for IV antibiotics. He agrees to stay.  No BM>  Pain controlled.  He has been moving fingers.    Objective: Vitals:   11/12/17 1403 11/12/17 1950 11/13/17 0412 11/13/17 0729  BP: (!) 142/70 (!) 141/65 136/73 (!) 144/84  Pulse: 86 93 79 79  Resp:    16  Temp: 97.8 F (36.6 C) 98.6 F (37 C) 97.9 F (36.6 C) 99.3 F (37.4 C)  TempSrc: Oral Oral Oral Oral  SpO2: 97% 98% 99% 98%  Weight:      Height:        Intake/Output Summary (Last 24 hours) at 11/13/2017 1001 Last data filed at 11/13/2017 0900 Gross per 24  hour  Intake 4567.83 ml  Output 560 ml  Net 4007.83 ml   Filed Weights   11/10/17 1943 11/12/17 1006  Weight: 70.8 kg 70.8 kg    Examination:  General exam: NAD Respiratory system: CTA Cardiovascular system: S 1, S 2 RRR Gastrointestinal system: BS present , soft, nt Central nervous system: Alert.  Extremities: Right ankle with dressing. Both hands with dressing.  Skin: abrasion right side abdomen,    Data Reviewed: I have personally reviewed following labs and imaging studies  CBC: Recent Labs  Lab  11/10/17 2108 11/10/17 2122 11/11/17 0442 11/12/17 0759 11/13/17 0802  WBC 12.7*  --  10.6* 5.7 6.7  NEUTROABS 11.0*  --   --   --   --   HGB 10.7* 10.5* 9.9* 9.7* 8.7*  HCT 32.6* 31.0* 30.6* 29.7* 26.8*  MCV 87.4  --  87.9 87.4 87.3  PLT 168  --  169 142* 146*   Basic Metabolic Panel: Recent Labs  Lab 11/10/17 2108 11/10/17 2122 11/11/17 0442 11/12/17 0759 11/13/17 0802  NA 141 142 141 141 140  K 3.2* 3.3* 3.2* 3.9 3.8  CL 108 106 108 111 110  CO2 25  --  27 23 24   GLUCOSE 189* 185* 140* 118* 211*  BUN 16 18 13  5* 6*  CREATININE 1.17 1.20 1.02 0.98 0.97  CALCIUM 8.5*  --  7.8* 8.3* 8.6*  MG  --   --  1.7  --   --   PHOS  --   --  3.1  --   --    GFR: Estimated Creatinine Clearance: 73.8 mL/min (by C-G formula based on SCr of 0.97 mg/dL). Liver Function Tests: Recent Labs  Lab 11/10/17 2108 11/11/17 0442 11/12/17 0759 11/13/17 0802  AST 16 142* 38 25  ALT 17 79* 85* 72*  ALKPHOS 52 45 53 57  BILITOT 0.4 1.1 0.6 0.5  PROT 5.6* 5.3* 5.5* 5.7*  ALBUMIN 3.2* 3.1* 2.8* 2.8*   No results for input(s): LIPASE, AMYLASE in the last 168 hours. No results for input(s): AMMONIA in the last 168 hours. Coagulation Profile: No results for input(s): INR, PROTIME in the last 168 hours. Cardiac Enzymes: No results for input(s): CKTOTAL, CKMB, CKMBINDEX, TROPONINI in the last 168 hours. BNP (last 3 results) No results for input(s): PROBNP in the last 8760 hours. HbA1C: Recent Labs    11/11/17 0442  HGBA1C 6.3*   CBG: Recent Labs  Lab 11/12/17 0944  GLUCAP 177*   Lipid Profile: No results for input(s): CHOL, HDL, LDLCALC, TRIG, CHOLHDL, LDLDIRECT in the last 72 hours. Thyroid Function Tests: Recent Labs    11/11/17 0442  TSH 1.670   Anemia Panel: No results for input(s): VITAMINB12, FOLATE, FERRITIN, TIBC, IRON, RETICCTPCT in the last 72 hours. Sepsis Labs: No results for input(s): PROCALCITON, LATICACIDVEN in the last 168 hours.  Recent Results (from the  past 240 hour(s))  Surgical pcr screen     Status: None   Collection Time: 11/11/17  9:22 PM  Result Value Ref Range Status   MRSA, PCR NEGATIVE NEGATIVE Final   Staphylococcus aureus NEGATIVE NEGATIVE Final    Comment: (NOTE) The Xpert SA Assay (FDA approved for NASAL specimens in patients 62 years of age and older), is one component of a comprehensive surveillance program. It is not intended to diagnose infection nor to guide or monitor treatment. Performed at [facility], [address], [place], [place] [address]  Radiology Studies: Dg Ribs Unilateral Right  Result Date: 11/11/2017 CLINICAL DATA:  Attacked by a pit bull yesterday.  Right rib pain. EXAM: RIGHT RIBS - 2 VIEW COMPARISON:  None. FINDINGS: No fracture or other bone lesions are seen involving the ribs. No hemothorax or pneumothorax. Atelectasis in both lower lobes. IMPRESSION: 1. No evident fracture. 2. Bilateral lower lobe atelectasis. Electronically Signed   By: Marnee Spring M.D.   On: 11/11/2017 13:14   Dg Ankle Complete Right  Result Date: 11/11/2017 CLINICAL DATA:  Posttraumatic right ankle pain.  Initial encounter. EXAM: RIGHT ANKLE - COMPLETE 3+ VIEW COMPARISON:  None. FINDINGS: Lateral soft tissue swelling. There is no evidence of fracture, dislocation, or joint effusion. Heel spur. Arterial calcification IMPRESSION: Soft tissue swelling without fracture. Electronically Signed   By: Marnee Spring M.D.   On: 11/11/2017 13:15   Ct Abdomen Pelvis W Contrast  Result Date: 11/11/2017 CLINICAL DATA:  Penetrating abdominal trauma. Attacked by pit bull. Right flank injury. EXAM: CT ABDOMEN AND PELVIS WITH CONTRAST TECHNIQUE: Multidetector CT imaging of the abdomen and pelvis was performed using the standard protocol following bolus administration of intravenous contrast. CONTRAST:  ISOVUE-300 IOPAMIDOL (ISOVUE-300) INJECTION 61% COMPARISON:  07/23/2017 FINDINGS: Lower chest:  Minimal atelectasis.   No evidence of injury Hepatobiliary: Chronic intrahepatic biliary duct dilatation. Cholecystectomy.No evidence of liver injury. Pancreas: Chronic volume loss and decreased enhancement in the midline pancreas where the pancreas is indistinguishable from the stomach. Patient has history of pancreatitis and pseudocyst drainage. A cyst was seen in this location in 2017. Proximal to this level the main pancreatic duct is chronically dilated to 3-4 mm. Annular pancreas. No evidence of acute pancreatitis. Spleen: Unremarkable. Adrenals/Urinary Tract: Negative adrenals. Small bilateral renal calculi measuring up to 4 mm on the left. Unremarkable bladder. Stomach/Bowel: No obstruction. No appendicitis. Colonic diverticulosis. 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 No acute arterial finding. No mass or adenopathy. Reproductive:No pathologic findings. Other: No ascites or pneumoperitoneum. Shallow wide necked periumbilical hernia containing fat and nonobstructed small bowel. Musculoskeletal: Stranding in the right flank without collection. Advanced lower lumbar facet arthropathy with L4-5 and L3-4 degenerative disc narrowing. No acute osseous finding. IMPRESSION: 1. Soft tissue injury in the right flank without evidence of intraperitoneal or intrathoracic extension. Negative for fracture. 2. History of pancreatitis and pseudocyst with unchanged scar-like appearance in the midline pancreatic body with proximal duct dilatation. 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 3. Annular pancreas. 4. Chronic intrahepatic bile duct dilatation. 5. Nephrolithiasis and colonic diverticulosis. Electronically Signed   By: Marnee Spring M.D.   On: 11/11/2017 21:36   Dg Hip Unilat With Pelvis 2-3 Views Right  Result Date: 11/11/2017 CLINICAL DATA:  62 year old male status post dog attack EXAM:  DG HIP (WITH OR WITHOUT PELVIS) 2-3V RIGHT COMPARISON:  None. FINDINGS: There is no evidence of hip fracture or dislocation. There is no evidence of arthropathy or other focal bone abnormality. IMPRESSION: Negative. Electronically Signed   By: Malachy Moan M.D.   On: 11/11/2017 13:04        Scheduled Meds: . bacitracin  1 application Topical BID  . clonazePAM  0.5 mg Oral QHS  . DULoxetine  60 mg Oral q morning - 10a  . gabapentin  600 mg Oral TID  . levothyroxine  112 mcg Oral QAC breakfast  . lipase/protease/amylase  144,000 Units Oral TID WC  .  polyethylene glycol  17 g Oral Daily  . senna  1 tablet Oral Daily  . tamsulosin  0.4 mg Oral BID   Continuous Infusions: . sodium chloride Stopped (11/13/17 0800)  . ciprofloxacin Stopped (11/13/17 0800)  . clindamycin (CLEOCIN) IV Stopped (11/13/17 3009)  . famotidine (PEPCID) IV Stopped (11/13/17 0041)     LOS: 3 days    Time spent:35 minutes     Alba Cory, MD [facility] Pager [phone]  If 7PM-7AM, please contact night-coverage [email or web address] Password [email or web address] 11/13/2017, 10:01 AM

## 2017-11-14 LAB — COMPREHENSIVE METABOLIC PANEL
ALT: 52 U/L — ABNORMAL HIGH (ref 0–44)
ANION GAP: 4 — AB (ref 5–15)
AST: 16 U/L (ref 15–41)
Albumin: 2.7 g/dL — ABNORMAL LOW (ref 3.5–5.0)
Alkaline Phosphatase: 50 U/L (ref 38–126)
BILIRUBIN TOTAL: 0.3 mg/dL (ref 0.3–1.2)
BUN: 8 mg/dL (ref 8–23)
CHLORIDE: 115 mmol/L — AB (ref 98–111)
CO2: 24 mmol/L (ref 22–32)
Calcium: 8.4 mg/dL — ABNORMAL LOW (ref 8.9–10.3)
Creatinine, Ser: 0.98 mg/dL (ref 0.61–1.24)
Glucose, Bld: 145 mg/dL — ABNORMAL HIGH (ref 70–99)
POTASSIUM: 3.6 mmol/L (ref 3.5–5.1)
Sodium: 143 mmol/L (ref 135–145)
TOTAL PROTEIN: 5.7 g/dL — AB (ref 6.5–8.1)

## 2017-11-14 LAB — CBC
HEMATOCRIT: 25.1 % — AB (ref 39.0–52.0)
HEMOGLOBIN: 8.2 g/dL — AB (ref 13.0–17.0)
MCH: 28.9 pg (ref 26.0–34.0)
MCHC: 32.7 g/dL (ref 30.0–36.0)
MCV: 88.4 fL (ref 78.0–100.0)
Platelets: 161 10*3/uL (ref 150–400)
RBC: 2.84 MIL/uL — AB (ref 4.22–5.81)
RDW: 14.8 % (ref 11.5–15.5)
WBC: 4.5 10*3/uL (ref 4.0–10.5)

## 2017-11-14 MED ORDER — SENNOSIDES-DOCUSATE SODIUM 8.6-50 MG PO TABS
1.0000 | ORAL_TABLET | Freq: Two times a day (BID) | ORAL | Status: DC
  Administered 2017-11-14 – 2017-11-16 (×4): 1 via ORAL
  Filled 2017-11-14 (×4): qty 1

## 2017-11-14 NOTE — Progress Notes (Signed)
Occupational Therapy Treatment Patient Details Name: Justin [name] MRN: [id number] DOB: April 10, 1955 Today's Date: 11/14/2017    History of present illness 62 y.o. 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 Presenting with multiple dog bites to BUEs and RLE. Underwent (11/11/17) extensive surgery to BUEs and RLE including: debridement and revision amputation right ring finger at the proximal phalanx level, fasciotomy right forearm, left thumb irrigation debridement extensor apparatus, dorsal hand irrigation debridement, proximal phalanx fracture left hand, left ring finger ulnar and radial digital nerve neuro lysis,right middle finger PIP joint extensor tendon tenolysis tenosynovectomy, debridement right foot, and median nerve neuro lysis right forearm.   OT comments  Patient progressing well.  Completes bed mobility with supervision, mobility using B platform RW with min guard to supervision for safety, toilet transfers with min guard assist, simulated shower transfers with min guard assist, and toileting with total assistance.  He was educated on safety, precautions, mobility and exercises.  Reports completing B hand ROM throughout the day, voicing "I've got to, I need to play my guitar again.". Will have spouses support at discharge to assist with ADLs as wounds heal.  Updated dc plan, as do not foresee need for HHOT services at dc.  Will continue to follow while admitted.     Follow Up Recommendations  Follow surgeon's recommendation for DC plan and follow-up therapies;Supervision/Assistance - 24 hour    Equipment Recommendations  3 in 1 bedside commode    Recommendations for Other Services PT consult    Precautions / Restrictions Precautions Precautions: Fall Restrictions Weight Bearing Restrictions: Yes RUE Weight  Bearing: Weight bearing as tolerated LUE Weight Bearing: Weight bearing as tolerated RLE Weight Bearing: Weight bearing as tolerated       Mobility Bed Mobility Overal bed mobility: Needs Assistance Bed Mobility: Supine to Sit     Supine to sit: Supervision     General bed mobility comments: Pt seated EOB upon arrival requesting to use bathroom  Transfers Overall transfer level: Needs assistance Equipment used: Bilateral platform walker Transfers: Sit to/from Stand Sit to Stand: Supervision         General transfer comment: cueing for safety and hand placement, avoiding to pull on platform RW    Balance Overall balance assessment: Needs assistance Sitting-balance support: No upper extremity supported;Feet supported Sitting balance-Leahy Scale: Good     Standing balance support: No upper extremity supported;During functional activity Standing balance-Leahy Scale: Fair Standing balance comment: reliant on UE support                           ADL either performed or assessed with clinical judgement   ADL Overall ADL's : Needs assistance/impaired     Grooming: Set up;Sitting;With caregiver independent assisting                   Toilet Transfer: Min guard;Ambulation;Comfort height toilet;RW(B platform RW) Toilet Transfer Details (indicate cue type and reason): min guard for safety  Toileting- Clothing Manipulation and Hygiene: Total assistance;Sitting/lateral lean;With caregiver independent assisting Toileting - Clothing Manipulation Details (indicate cue type and reason): spouse assisting with hygiene Tub/ Shower Transfer: Walk-in shower;Min guard;Ambulation;3 in 1;Rolling walker;Cueing for safety(B platform RW) Tub/Shower Transfer Details (indicate cue type and reason): educated on technique with reverse stepping to 3:1 in shower, min guard for safety  Functional mobility  during ADLs: Supervision/safety;Min guard;Rolling walker(B platform RW  ) General ADL Comments: Pt highly motivated, cueing for safety     Vision       Perception     Praxis      Cognition Arousal/Alertness: Awake/alert Behavior During Therapy: WFL for tasks assessed/performed Overall Cognitive Status: Within Functional Limits for tasks assessed                                 General Comments: continues to require min cueing for safety awareness        Exercises Hand Exercises Digit Composite Flexion: AROM;Right;10 reps;Seated;Left Composite Extension: AROM;Right;Left;10 reps;Seated   Shoulder Instructions       General Comments spouse present and supportive    Pertinent Vitals/ Pain       Pain Assessment: Faces Faces Pain Scale: Hurts a little bit Pain Location: RLE, BUEs, and back Pain Descriptors / Indicators: Burning;Tingling;Guarding Pain Intervention(s): Limited activity within patient's tolerance;Repositioned  Home Living                                          Prior Functioning/Environment              Frequency  Min 3X/week        Progress Toward Goals  OT Goals(current goals can now be found in the care plan section)  Progress towards OT goals: Progressing toward goals  Acute Rehab OT Goals Patient Stated Goal: "Throw ball with grandson" OT Goal Formulation: With patient Time For Goal Achievement: 11/25/17 Potential to Achieve Goals: Good  Plan Discharge plan needs to be updated;Frequency remains appropriate    Co-evaluation                 AM-PAC PT "6 Clicks" Daily Activity     Outcome Measure   Help from another person eating meals?: A Little Help from another person taking care of personal grooming?: A Little Help from another person toileting, which includes using toliet, bedpan, or urinal?: Total Help from another person bathing (including washing, rinsing, drying)?: A Lot Help from another person to put on and taking off regular upper body clothing?: A  Little Help from another person to put on and taking off regular lower body clothing?: A Lot 6 Click Score: 14    End of Session Equipment Utilized During Treatment: Other (comment)(B platform RW )  OT Visit Diagnosis: Unsteadiness on feet (R26.81);Other abnormalities of gait and mobility (R26.89);Muscle weakness (generalized) (M62.81);Pain Pain - part of body: Arm;Hand;Leg;Ankle and joints of foot   Activity Tolerance Patient tolerated treatment well   Patient Left in chair;with call bell/phone within reach;with family/visitor present   Nurse Communication          Time: 5573-2202 OT Time Calculation (min): 27 min  Charges: OT General Charges $OT Visit: 1 Visit OT Treatments $Self Care/Home Management : 23-37 mins  Chancy Milroy, OTR/L  Pager [phone]    Chancy Milroy 11/14/2017, 5:02 PM

## 2017-11-14 NOTE — Progress Notes (Signed)
OT Cancellation Note  Patient Details Name: ANDDY WINGERT MRN: [id number] DOB: 15-Dec-1955   Cancelled Treatment:    Reason Eval/Treat Not Completed: Pain limiting ability to participate.  Patient reports "I just cant today".  Therapist notified RN for pain medication and will check back as able.   Chancy Milroy, OTR/L  Pager [phone]   Chancy Milroy 11/14/2017, 2:27 PM

## 2017-11-14 NOTE — Progress Notes (Signed)
Physical Therapy Wound Evaluation and Treatment Patient Details  Name: Justin [name] MRN: [id number] Date of Birth: 1955-12-20  Today's Date: 11/14/2017 Time: 2025-4270 Time Calculation (min): 119 min  Subjective  Subjective: Pt very painful but tolerant during treatment session.  Patient and Family Stated Goals: For wife to feel comfortable performing dressing changes. Date of Onset: 11/10/17 Prior Treatments: I&D, stitches in various bite locations, and closure of finger amputation.  Pain Score: Pt appears to be in a considerable amount of pain even with pain medication. Reports overall pain improved with IV pain meds.  Wound Assessment  Wound / Incision (Open or Dehisced) 11/14/17 Incision - Open Hand Right and Forearm (Active)  Wound Image   11/14/2017 12:58 PM  Dressing Type Compression wrap;Gauze (Comment);Moist to dry;Non adherent 11/14/2017 12:58 PM  Dressing Changed Changed 11/14/2017 12:58 PM  Dressing Status Clean;Dry;Intact 11/14/2017 12:58 PM  Dressing Change Frequency Daily 11/14/2017 12:58 PM  Site / Wound Assessment Yellow;Red;Painful 11/14/2017 12:58 PM  % Wound base Red or Granulating 90% 11/14/2017 12:58 PM  % Wound base Yellow/Fibrinous Exudate 10% 11/14/2017 12:58 PM  % Wound base Black/Eschar 0% 11/14/2017 12:58 PM  % Wound base Other/Granulation Tissue (Comment) 0% 11/14/2017 12:58 PM  Peri-wound Assessment Intact 11/14/2017 12:58 PM  Margins Unattached edges (unapproximated) 11/14/2017 12:58 PM  Closure None;Sutures 11/14/2017 12:58 PM  Drainage Amount Minimal 11/14/2017 12:58 PM  Drainage Description Sanguineous;Purulent;No odor 11/14/2017 12:58 PM  Treatment Hydrotherapy (Pulse lavage);Packing (Impregnated strip) 11/14/2017 12:58 PM     Wound / Incision (Open or Dehisced) 11/14/17 Incision - Open Hand Left (Active)  Wound Image    11/14/2017 12:58 PM  Dressing Type Compression wrap;Gauze (Comment);Moist to dry;Non adherent 11/14/2017 12:58 PM  Dressing Changed Changed 11/14/2017 12:58 PM    Dressing Status Clean;Dry;Intact 11/14/2017 12:58 PM  Dressing Change Frequency Daily 11/14/2017 12:58 PM  Site / Wound Assessment Red;Bleeding;Painful 11/14/2017 12:58 PM  % Wound base Red or Granulating 100% 11/14/2017 12:58 PM  % Wound base Yellow/Fibrinous Exudate 0% 11/14/2017 12:58 PM  % Wound base Black/Eschar 0% 11/14/2017 12:58 PM  % Wound base Other/Granulation Tissue (Comment) 0% 11/14/2017 12:58 PM  Peri-wound Assessment Intact 11/14/2017 12:58 PM  Margins Unattached edges (unapproximated) 11/14/2017 12:58 PM  Closure None;Sutures 11/14/2017 12:58 PM  Drainage Amount Minimal 11/14/2017 12:58 PM  Drainage Description Sanguineous 11/14/2017 12:58 PM  Treatment Hydrotherapy (Pulse lavage);Packing (Saline gauze);Packing (Impregnated strip) 11/14/2017 12:58 PM     Wound / Incision (Open or Dehisced) 11/14/17 Incision - Open Foot Right (Active)  Wound Image    11/14/2017 12:58 PM  Dressing Type Compression wrap;Gauze (Comment);Moist to dry 11/14/2017 12:58 PM  Dressing Changed Changed 11/14/2017 12:58 PM  Dressing Status Clean;Dry;Intact 11/14/2017 12:58 PM  Dressing Change Frequency Daily 11/14/2017 12:58 PM  Site / Wound Assessment Red;Painful 11/14/2017 12:58 PM  % Wound base Red or Granulating 100% 11/14/2017 12:58 PM  % Wound base Yellow/Fibrinous Exudate 0% 11/14/2017 12:58 PM  % Wound base Black/Eschar 0% 11/14/2017 12:58 PM  % Wound base Other/Granulation Tissue (Comment) 0% 11/14/2017 12:58 PM  Peri-wound Assessment Intact;Erythema (blanchable) 11/14/2017 12:58 PM  Margins Unattached edges (unapproximated) 11/14/2017 12:58 PM  Closure None 11/14/2017 12:58 PM  Drainage Amount Minimal 11/14/2017 12:58 PM  Drainage Description Sanguineous 11/14/2017 12:58 PM  Treatment Hydrotherapy (Pulse lavage);Packing (Saline gauze);Packing (Impregnated strip) 11/14/2017 12:58 PM      Hydrotherapy Pulsed lavage therapy - wound location: R hand/forearm, L hand, R foot Pulsed Lavage with Suction (psi): 4 psi Pulsed Lavage with Suction -  Normal Saline Used: 1000 mL(x3) Pulsed Lavage Tip: Tip with splash shield   Wound Assessment and Plan  Wound Therapy - Assess/Plan/Recommendations Wound Therapy - Clinical Statement: Pt presents to hydrotherapy with multiple wounds s/p dog attack. Wounds that we are addressing span over L hand, R hand/forearm, and R foot. Due to the nature and number of wounds, measurements not taken, however pictures attached above for reference. Pt very painful at start of session however was able to tolerate treatment after addition of IV pain medication. Initiated education with pt and wife regarding dressing changes at home. Wife reports she does not feel comfortable at this time. Overall wife appears anxious regarding dressing changes, and may be appropriate for [facility] to come in initially s/p discharge to assist. Will discuss options with CM. This patient will benefit from continued hydrotherapy to decrease bioburden, promote wound bed healing, and further educate pt/family regarding dressing changes.  Wound Therapy - Functional Problem List: Decreased strength and AROM of B hands and R foot as well as acute pain consistent with above mentioned mechanism of injury.  Factors Delaying/Impairing Wound Healing: Infection - systemic/local Hydrotherapy Plan: Debridement;Dressing change;Patient/family education;Pulsatile lavage with suction Wound Therapy - Frequency: 6X / week Wound Therapy - Follow Up Recommendations: Home health RN Wound Plan: See above  Wound Therapy Goals- Improve the function of patient's integumentary system by progressing the wound(s) through the phases of wound healing (inflammation - proliferation - remodeling) by: Patient/Family will be able to : demonstrate dressing changes on x3 extremities with supervision from therapist.  Patient/Family Instruction Goal - Progress: Goal set today Goals/treatment plan/discharge plan were made with and agreed upon by patient/family: Yes Time For Goal  Achievement: 7 days Wound Therapy - Potential for Goals: Excellent  Goals will be updated until maximal potential achieved or discharge criteria met.  Discharge criteria: when goals achieved, discharge from hospital, MD decision/surgical intervention, no progress towards goals, refusal/missing three consecutive treatments without notification or medical reason.  GP     Thelma Comp 11/14/2017, 2:54 PM   Rolinda Roan, PT, DPT Acute Rehabilitation Services Pager: [phone] Office: [phone]

## 2017-11-14 NOTE — Progress Notes (Signed)
Physical Therapy Treatment Patient Details Name: Justin [name] MRN: [id number] DOB: Jun 13, 1955 Today's Date: 11/14/2017    History of Present Illness 62 y.o. 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 Presenting with multiple dog bites to BUEs and RLE. Underwent (11/11/17) extensive surgery to BUEs and RLE including: debridement and revision amputation right ring finger at the proximal phalanx level, fasciotomy right forearm, left thumb irrigation debridement extensor apparatus, dorsal hand irrigation debridement, proximal phalanx fracture left hand, left ring finger ulnar and radial digital nerve neuro lysis,right middle finger PIP joint extensor tendon tenolysis tenosynovectomy, debridement right foot, and median nerve neuro lysis right forearm.    PT Comments    Pt continues to progress with goals, performing ambulation with supervision with Bil platform RW for safety. Therapy session focused on education, improving overall safety awareness with mobility, and emphasis of donning post-op shoe prior to ambulation.Pt more willing to accept spouse's help with toileting and transfers. Will continue to follow and progress as able, per POC.   Follow Up Recommendations  Outpatient PT;Supervision for mobility/OOB     Equipment Recommendations  (BIL platform RW)    Recommendations for Other Services       Precautions / Restrictions Precautions Precautions: Fall Restrictions Weight Bearing Restrictions: Yes RUE Weight Bearing: Weight bearing as tolerated LUE Weight Bearing: Weight bearing as tolerated RLE Weight Bearing: Weight bearing as tolerated    Mobility  Bed Mobility               General bed mobility comments: Pt seated EOB upon arrival requesting to use bathroom  Transfers Overall transfer level: Needs  assistance Equipment used: Bilateral platform walker Transfers: Sit to/from Stand Sit to Stand: Supervision         General transfer comment: Pt demosntrating proper hand placement; pt continues to require cues for safety as he attempted to stand without platform RW safely aligned in front of him  Ambulation/Gait Ambulation/Gait assistance: Supervision Gait Distance (Feet): 20 Feet Assistive device: Bilateral platform walker Gait Pattern/deviations: Step-through pattern;Antalgic;Trunk flexed Gait velocity: decreased   General Gait Details: Pt ambulating with improved weight shift onto RLE reporting decreased pain in RLE at time of session; Supervision for safety   Stairs             Wheelchair Mobility    Modified Rankin (Stroke Patients Only)       Balance Overall balance assessment: Needs assistance Sitting-balance support: No upper extremity supported;Feet supported Sitting balance-Leahy Scale: Good     Standing balance support: Single extremity supported;Bilateral upper extremity supported;During functional activity Standing balance-Leahy Scale: Fair Standing balance comment: At least 1 UE support required for static standing; bil UE support required for dynamic standing activities                             Cognition Arousal/Alertness: Awake/alert Behavior During Therapy: WFL for tasks assessed/performed Overall Cognitive Status: Within Functional Limits for tasks assessed                                 General Comments: Pt still presenting with decreased safety awareness at time of session      Exercises      General Comments General comments (skin integrity, edema, etc.): Pt engaged  during therapy session providing assist when allowed       Pertinent Vitals/Pain Pain Assessment: Faces Faces Pain Scale: Hurts little more Pain Location: RLE, BUEs, and back Pain Descriptors / Indicators: Burning;Tingling;Guarding Pain  Intervention(s): Limited activity within patient's tolerance;Monitored during session    Home Living                      Prior Function            PT Goals (current goals can now be found in the care plan section) Acute Rehab PT Goals Patient Stated Goal: "Throw ball with grandson" PT Goal Formulation: With patient/family Time For Goal Achievement: 11/25/17 Potential to Achieve Goals: Good Progress towards PT goals: Progressing toward goals    Frequency    Min 4X/week      PT Plan Current plan remains appropriate    Co-evaluation              AM-PAC PT "6 Clicks" Daily Activity  Outcome Measure  Difficulty turning over in bed (including adjusting bedclothes, sheets and blankets)?: A Little Difficulty moving from lying on back to sitting on the side of the bed? : A Little Difficulty sitting down on and standing up from a chair with arms (e.g., wheelchair, bedside commode, etc,.)?: A Little Help needed moving to and from a bed to chair (including a wheelchair)?: A Little Help needed walking in hospital room?: A Little Help needed climbing 3-5 steps with a railing? : A Little 6 Click Score: 18    End of Session Equipment Utilized During Treatment: Gait belt Activity Tolerance: Patient tolerated treatment well Patient left: in bed;with call bell/phone within reach;Other (comment)(with PT to begin hydrotherapy ) Nurse Communication: Mobility status PT Visit Diagnosis: Other abnormalities of gait and mobility (R26.89);Pain;Difficulty in walking, not elsewhere classified (R26.2) Pain - Right/Left: Right Pain - part of body: Arm;Leg;Hand     Time: 1610-9604 PT Time Calculation (min) (ACUTE ONLY): 10 min  Charges:  $Self Care/Home Management: 23-37                     Donzetta Kohut, SPT  Student Physical Therapist Acute Rehab [phone]    Donzetta Kohut 11/14/2017, 3:10 PM

## 2017-11-14 NOTE — Progress Notes (Signed)
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 -Underwent I and D 8-31. : revision amputation right finger, proximal phalange level. Fasciotomy right arm, left thumb irrigation and debridement extensor apparatus. Dorsal hand irrigation, open treatment of proximal phalange left hand, left ring finger ulnar and radial digital nerve neurolysis. Irrigation and debridement right foot.   -repeat I/D in OR again on 9-02 -.Appreciate orthopedics Dr Cliffton Asters Help.  -Continue with IV antibiotics (ciparo/clinda) due to h/o pcn allergy -patient is started first hydrotherapy today, he is having a lot of pain, not well controlled by oral pain meds, he is getting iv fentanyl prn  Right ankle pain and hip pain;  X ray negative  Mild elevated lft,  H/o pancreatitis improving F/u with [facility]  Hypokalemia: replace prn and check mag  Hypothyroidism: continue synthroid  BPH, continue flomax   Code Status: full  Family Communication: patient and wife at bedside  Disposition Plan: home with home health in 1-2 days, pending pain control, wound healing,    Consultants:  orthopedics  Procedures:  As above  Antibiotics:  As above   Objective: BP 117/60 (BP Location: Left Leg)   Pulse 61   Temp 97.8 F (36.6 C) (Oral)   Resp 18   Ht 5' 7.01" (1.702 m)   Wt 70.8 kg   SpO2 98%   BMI 24.43 kg/m   Intake/Output Summary (Last 24 hours) at 11/14/2017 1714 Last data filed at 11/14/2017 0900 Gross per 24 hour  Intake  1130.99 ml  Output 500 ml  Net 630.99 ml   Filed Weights   11/10/17 1943 11/12/17 1006  Weight: 70.8 kg 70.8 kg    Exam: Patient is examined daily including today on 11/14/2017, exams remain the same as of yesterday except that has changed    General:  NAD  Cardiovascular: RRR  Respiratory: CTABL  Abdomen: Soft/ND/NT, positive BS  Musculoskeletal: Right ankle with dressing. Both hands with dressing  Neuro: alert, oriented  Skin: abrasion right side abdomen,  Data Reviewed: Basic Metabolic Panel: Recent Labs  Lab 11/10/17 2108 11/10/17 2122 11/11/17 0442 11/12/17 0759 11/13/17 0802 11/14/17 0348  NA 141 142 141 141 140 143  K 3.2* 3.3* 3.2* 3.9 3.8 3.6  CL 108 106 108 111 110 115*  CO2 25  --  27 23 24 24   GLUCOSE 189* 185* 140* 118* 211* 145*  BUN 16 18 13  5* 6* 8  CREATININE 1.17 1.20 1.02 0.98 0.97 0.98  CALCIUM 8.5*  --  7.8* 8.3* 8.6* 8.4*  MG  --   --  1.7  --   --   --   PHOS  --   --  3.1  --   --   --    Liver Function Tests: Recent Labs  Lab 11/10/17 2108 11/11/17 0442 11/12/17 0759 11/13/17 0802 11/14/17 0348  AST 16 142* 38 25 16  ALT 17 79* 85* 72* 52*  ALKPHOS 52 45 53 57 50  BILITOT 0.4 1.1 0.6 0.5 0.3  PROT 5.6* 5.3* 5.5* 5.7* 5.7*  ALBUMIN 3.2* 3.1* 2.8* 2.8* 2.7*   No results for input(s): LIPASE, AMYLASE in the last 168 hours. No results for input(s): AMMONIA in the last 168 hours. CBC: Recent Labs  Lab 11/10/17 2108 11/10/17 2122 11/11/17 0442 11/12/17 0759 11/13/17 0802 11/14/17 0348  WBC 12.7*  --  10.6* 5.7 6.7 4.5  NEUTROABS 11.0*  --   --   --   --   --   HGB 10.7* 10.5* 9.9* 9.7* 8.7* 8.2*  HCT 32.6* 31.0* 30.6* 29.7* 26.8* 25.1*  MCV 87.4  --  87.9 87.4 87.3 88.4  PLT 168  --  169 142* 146* 161   Cardiac Enzymes:   No results for input(s): CKTOTAL, CKMB, CKMBINDEX, TROPONINI in the last 168 hours. BNP (last 3 results) No results for input(s): BNP in the last 8760 hours.  ProBNP (last 3 results) No results  for input(s): PROBNP in the last 8760 hours.  CBG: Recent Labs  Lab 11/12/17 0944  GLUCAP 177*    Recent Results (from the past 240 hour(s))  Surgical pcr screen     Status: None   Collection Time: 11/11/17  9:22 PM  Result Value Ref Range Status   MRSA, PCR NEGATIVE NEGATIVE Final   Staphylococcus aureus NEGATIVE NEGATIVE Final    Comment: (NOTE) The Xpert SA Assay (FDA approved for NASAL specimens in patients 62 years of age and older), is one component of a comprehensive surveillance program. It is not intended to diagnose infection nor to guide or monitor treatment. Performed at [facility], [address], [place], [place] [address]      Studies: No results found.  Scheduled Meds: . bacitracin  1 application Topical BID  . clonazePAM  0.5 mg Oral QHS  . DULoxetine  60 mg Oral q morning - 10a  . ferrous sulfate  325 mg Oral BID WC  . gabapentin  600 mg Oral TID  . levothyroxine  112 mcg Oral QAC breakfast  . lipase/protease/amylase  144,000 Units Oral TID WC  . polyethylene glycol  17 g Oral Daily  . senna-docusate  1 tablet Oral BID  . tamsulosin  0.4 mg Oral BID    Continuous Infusions: . ciprofloxacin 400 mg (11/14/17 0836)  . clindamycin (CLEOCIN) IV 600 mg (11/14/17 1436)  . famotidine (PEPCID) IV 20 mg (11/14/17 1110)     Time spent: I have personally reviewed and interpreted on  11/14/2017 daily labs,  imagings as discussed above under date review session and assessment and plans.  I reviewed all nursing notes, pharmacy notes, consultant notes,  vitals, pertinent old records  I have discussed plan of care as described above with RN , patient and family on 11/14/2017   Albertine Grates MD, PhD  [facility] Pager [phone]. If 7PM-7AM, please contact night-coverage at [email or web address], password [facility] 11/14/2017, 5:14 PM  LOS: 4 days

## 2017-11-15 LAB — COMPREHENSIVE METABOLIC PANEL
ALT: 55 U/L — ABNORMAL HIGH (ref 0–44)
AST: 26 U/L (ref 15–41)
Albumin: 2.9 g/dL — ABNORMAL LOW (ref 3.5–5.0)
Alkaline Phosphatase: 81 U/L (ref 38–126)
Anion gap: 7 (ref 5–15)
BILIRUBIN TOTAL: 0.6 mg/dL (ref 0.3–1.2)
BUN: 9 mg/dL (ref 8–23)
CO2: 26 mmol/L (ref 22–32)
Calcium: 8.7 mg/dL — ABNORMAL LOW (ref 8.9–10.3)
Chloride: 108 mmol/L (ref 98–111)
Creatinine, Ser: 1 mg/dL (ref 0.61–1.24)
Glucose, Bld: 120 mg/dL — ABNORMAL HIGH (ref 70–99)
POTASSIUM: 3.6 mmol/L (ref 3.5–5.1)
Sodium: 141 mmol/L (ref 135–145)
TOTAL PROTEIN: 5.8 g/dL — AB (ref 6.5–8.1)

## 2017-11-15 LAB — CBC WITH DIFFERENTIAL/PLATELET
Abs Immature Granulocytes: 0 10*3/uL (ref 0.0–0.1)
BASOS ABS: 0 10*3/uL (ref 0.0–0.1)
BASOS PCT: 0 %
EOS ABS: 0.1 10*3/uL (ref 0.0–0.7)
EOS PCT: 3 %
HEMATOCRIT: 28.2 % — AB (ref 39.0–52.0)
Hemoglobin: 9.3 g/dL — ABNORMAL LOW (ref 13.0–17.0)
IMMATURE GRANULOCYTES: 1 %
LYMPHS ABS: 0.9 10*3/uL (ref 0.7–4.0)
Lymphocytes Relative: 19 %
MCH: 28.5 pg (ref 26.0–34.0)
MCHC: 33 g/dL (ref 30.0–36.0)
MCV: 86.5 fL (ref 78.0–100.0)
Monocytes Absolute: 0.5 10*3/uL (ref 0.1–1.0)
Monocytes Relative: 10 %
NEUTROS PCT: 67 %
Neutro Abs: 3.2 10*3/uL (ref 1.7–7.7)
PLATELETS: 231 10*3/uL (ref 150–400)
RBC: 3.26 MIL/uL — ABNORMAL LOW (ref 4.22–5.81)
RDW: 14.6 % (ref 11.5–15.5)
WBC: 4.8 10*3/uL (ref 4.0–10.5)

## 2017-11-15 LAB — MAGNESIUM: MAGNESIUM: 1.8 mg/dL (ref 1.7–2.4)

## 2017-11-15 LAB — PROTIME-INR
INR: 1.01
PROTHROMBIN TIME: 13.2 s (ref 11.4–15.2)

## 2017-11-15 LAB — LIPASE, BLOOD: Lipase: 33 U/L (ref 11–51)

## 2017-11-15 MED ORDER — CIPROFLOXACIN HCL 500 MG PO TABS: 500.0000 mg | ORAL_TABLET | Freq: Two times a day (BID) | ORAL | 0 refills | Status: AC

## 2017-11-15 MED ORDER — LIDOCAINE VISCOUS HCL 2 % MT SOLN
15.0000 mL | Freq: Four times a day (QID) | OROMUCOSAL | Status: DC | PRN
  Filled 2017-11-15: qty 15

## 2017-11-15 MED ORDER — CLINDAMYCIN HCL 300 MG PO CAPS: 300.0000 mg | ORAL_CAPSULE | Freq: Three times a day (TID) | ORAL | 0 refills | Status: AC

## 2017-11-15 MED ORDER — SACCHAROMYCES BOULARDII 250 MG PO CAPS: 250.0000 mg | ORAL_CAPSULE | Freq: Two times a day (BID) | ORAL | 0 refills | Status: AC

## 2017-11-15 MED ORDER — MAGNESIUM SULFATE IN D5W 1-5 GM/100ML-% IV SOLN
1.0000 g | Freq: Once | INTRAVENOUS | Status: AC
  Administered 2017-11-15: 1 g via INTRAVENOUS
  Filled 2017-11-15: qty 100

## 2017-11-15 MED ORDER — SACCHAROMYCES BOULARDII 250 MG PO CAPS
250.0000 mg | ORAL_CAPSULE | Freq: Two times a day (BID) | ORAL | Status: DC
  Administered 2017-11-15 – 2017-11-16 (×3): 250 mg via ORAL
  Filled 2017-11-15 (×3): qty 1

## 2017-11-15 MED ORDER — POTASSIUM CHLORIDE CRYS ER 20 MEQ PO TBCR
40.0000 meq | EXTENDED_RELEASE_TABLET | Freq: Once | ORAL | Status: AC
  Administered 2017-11-15: 40 meq via ORAL
  Filled 2017-11-15: qty 2

## 2017-11-15 MED ORDER — OXYCODONE HCL 5 MG PO TABS
5.0000 mg | ORAL_TABLET | ORAL | Status: AC
  Administered 2017-11-15: 5 mg via ORAL

## 2017-11-15 MED ORDER — FAMOTIDINE 20 MG PO TABS
20.0000 mg | ORAL_TABLET | Freq: Every day | ORAL | Status: DC
  Administered 2017-11-15: 20 mg via ORAL
  Filled 2017-11-15: qty 1

## 2017-11-15 NOTE — Progress Notes (Signed)
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 -Underwent I and D 8-31. : revision amputation right finger, proximal phalange level. Fasciotomy right arm, left thumb irrigation and debridement extensor apparatus. Dorsal hand irrigation, open treatment of proximal phalange left hand, left ring finger ulnar and radial digital nerve neurolysis. Irrigation and debridement right foot.   -repeat I/D in OR again on 9-02 -.Appreciate orthopedics Dr Cliffton Asters Help.  -Continue with IV antibiotics (ciparo/clinda) due to h/o pcn allergy -patient started first hydrotherapy on 9/4, -pain control is an issue, continue adjust prn pain meds   Right ankle pain and hip pain;  X ray negative  Mild elevated lft,  H/o pancreatitis improving F/u with [facility]  Hypokalemia:  Continue replace k, will give ig iv mag  repeat lab in am  Hypothyroidism: continue synthroid  BPH, continue flomax  Constipation: start stool softener    Code Status: full  Family Communication: patient and wife at bedside  Disposition Plan: home with home health , hopefully on 9/6, pending pain control, wound healing,    Consultants:  orthopedics  Procedures:  As above  Antibiotics:  As above   Objective: BP 117/67 (BP Location: Left Leg)   Pulse 68   Temp 97.9 F (36.6 C) (Oral)   Resp 16   Ht 5' 7.01" (1.702 m)   Wt 70.8 kg   SpO2 100%   BMI 24.43 kg/m  No intake or output data in the 24 hours ending 11/15/17 1002 Filed  Weights   11/10/17 1943 11/12/17 1006  Weight: 70.8 kg 70.8 kg    Exam: Patient is examined daily including today on 11/15/2017, exams remain the same as of yesterday except that has changed    General:  NAD  Cardiovascular: RRR  Respiratory: CTABL  Abdomen: Soft/ND/NT, positive BS  Musculoskeletal: Right ankle with dressing. Both hands with dressing  Neuro: alert, oriented  Skin: abrasion right side abdomen,  Data Reviewed: Basic Metabolic Panel: Recent Labs  Lab 11/11/17 0442 11/12/17 0759 11/13/17 0802 11/14/17 0348 11/15/17 0435  NA 141 141 140 143 141  K 3.2* 3.9 3.8 3.6 3.6  CL 108 111 110 115* 108  CO2 27 23 24 24 26   GLUCOSE 140* 118* 211* 145* 120*  BUN 13 5* 6* 8 9  CREATININE 1.02 0.98 0.97 0.98 1.00  CALCIUM 7.8* 8.3* 8.6* 8.4* 8.7*  MG 1.7  --   --   --  1.8  PHOS 3.1  --   --   --   --    Liver Function Tests: Recent Labs  Lab 11/11/17 0442 11/12/17 0759 11/13/17 0802 11/14/17 0348 11/15/17 0435  AST 142* 38 25 16 26   ALT 79* 85* 72* 52* 55*  ALKPHOS 45 53 57 50 81  BILITOT 1.1 0.6 0.5 0.3 0.6  PROT 5.3* 5.5* 5.7* 5.7* 5.8*  ALBUMIN 3.1* 2.8* 2.8* 2.7* 2.9*   Recent Labs  Lab 11/15/17 0435  LIPASE 33   No results for input(s): AMMONIA  in the last 168 hours. CBC: Recent Labs  Lab 11/10/17 2108  11/11/17 0442 11/12/17 0759 11/13/17 0802 11/14/17 0348 11/15/17 0435  WBC 12.7*  --  10.6* 5.7 6.7 4.5 4.8  NEUTROABS 11.0*  --   --   --   --   --  3.2  HGB 10.7*   < > 9.9* 9.7* 8.7* 8.2* 9.3*  HCT 32.6*   < > 30.6* 29.7* 26.8* 25.1* 28.2*  MCV 87.4  --  87.9 87.4 87.3 88.4 86.5  PLT 168  --  169 142* 146* 161 231   < > = values in this interval not displayed.   Cardiac Enzymes:   No results for input(s): CKTOTAL, CKMB, CKMBINDEX, TROPONINI in the last 168 hours. BNP (last 3 results) No results for input(s): BNP in the last 8760 hours.  ProBNP (last 3 results) No results for input(s): PROBNP in the last 8760  hours.  CBG: Recent Labs  Lab 11/12/17 0944  GLUCAP 177*    Recent Results (from the past 240 hour(s))  Surgical pcr screen     Status: None   Collection Time: 11/11/17  9:22 PM  Result Value Ref Range Status   MRSA, PCR NEGATIVE NEGATIVE Final   Staphylococcus aureus NEGATIVE NEGATIVE Final    Comment: (NOTE) The Xpert SA Assay (FDA approved for NASAL specimens in patients 47 years of age and older), is one component of a comprehensive surveillance program. It is not intended to diagnose infection nor to guide or monitor treatment. Performed at [facility], [address] [place], [place] [address]      Studies: No results found.  Scheduled Meds: . bacitracin  1 application Topical BID  . clonazePAM  0.5 mg Oral QHS  . DULoxetine  60 mg Oral q morning - 10a  . ferrous sulfate  325 mg Oral BID WC  . gabapentin  600 mg Oral TID  . levothyroxine  112 mcg Oral QAC breakfast  . lipase/protease/amylase  144,000 Units Oral TID WC  . polyethylene glycol  17 g Oral Daily  . senna-docusate  1 tablet Oral BID  . tamsulosin  0.4 mg Oral BID    Continuous Infusions: . ciprofloxacin 400 mg (11/14/17 1949)  . clindamycin (CLEOCIN) IV 600 mg (11/15/17 0514)  . famotidine (PEPCID) IV 20 mg (11/14/17 2205)  . magnesium sulfate 1 - 4 g bolus IVPB 1 g (11/15/17 1001)     Time spent: I have personally reviewed and interpreted on  11/15/2017 daily labs,  imagings as discussed above under date review session and assessment and plans.  I reviewed all nursing notes, pharmacy notes, consultant notes,  vitals, pertinent old records  I have discussed plan of care as described above with RN , patient and family on 11/15/2017   Albertine Grates MD, PhD  [facility] Pager [phone]. If 7PM-7AM, please contact night-coverage at [email or web address], password [facility] 11/15/2017, 10:02 AM  LOS: 5 days

## 2017-11-15 NOTE — Progress Notes (Signed)
Physical Therapy Wound Treatment Patient Details  Name: Justin [name] MRN: [id number] Date of Birth: [birth year]  Today's Date: 11/15/2017 Time: 1015-1202 Time Calculation (min): 107 min  Subjective  Subjective: Pt very painful but tolerant during treatment session.  Patient and Family Stated Goals: Independence for wife to complete dressing changes Date of Onset: 11/10/17 Prior Treatments: I&D, stitches in various bite locations, and closure of finger amputation.  Pain Score: Tolerable without IV pain meds  Wound Assessment  Wound / Incision (Open or Dehisced) 11/14/17 Incision - Open Hand Right and Forearm (Active)  Wound Image   11/14/2017 12:58 PM  Dressing Type Compression wrap;Gauze (Comment);Moist to dry;Non adherent 11/15/2017 12:09 PM  Dressing Changed Changed 11/15/2017 12:09 PM  Dressing Status Clean;Dry;Intact 11/15/2017 12:09 PM  Dressing Change Frequency Daily 11/15/2017 12:09 PM  Site / Wound Assessment Yellow;Red;Painful 11/15/2017 12:09 PM  % Wound base Red or Granulating 95% 11/15/2017 12:09 PM  % Wound base Yellow/Fibrinous Exudate 5% 11/15/2017 12:09 PM  % Wound base Black/Eschar 0% 11/15/2017 12:09 PM  % Wound base Other/Granulation Tissue (Comment) 0% 11/15/2017 12:09 PM  Peri-wound Assessment Intact 11/15/2017 12:09 PM  Margins Unattached edges (unapproximated) 11/15/2017 12:09 PM  Closure None;Sutures 11/15/2017 12:09 PM  Drainage Amount Minimal 11/15/2017 12:09 PM  Drainage Description Sanguineous;Purulent;No odor 11/15/2017 12:09 PM  Treatment Hydrotherapy (Pulse lavage);Packing (Impregnated strip) 11/15/2017 12:09 PM     Wound / Incision (Open or Dehisced) 11/14/17 Incision - Open Hand Left (Active)  Wound Image    11/14/2017 12:58 PM  Dressing Type Compression wrap;Gauze (Comment);Moist to dry;Non adherent 11/15/2017 12:09 PM  Dressing Changed Changed 11/15/2017 12:09 PM  Dressing Status Clean;Dry;Intact 11/15/2017 12:09 PM  Dressing Change Frequency Daily 11/15/2017 12:09 PM  Site / Wound  Assessment Red;Bleeding;Painful 11/15/2017 12:09 PM  % Wound base Red or Granulating 100% 11/15/2017 12:09 PM  % Wound base Yellow/Fibrinous Exudate 0% 11/15/2017 12:09 PM  % Wound base Black/Eschar 0% 11/15/2017 12:09 PM  % Wound base Other/Granulation Tissue (Comment) 0% 11/15/2017 12:09 PM  Peri-wound Assessment Intact 11/15/2017 12:09 PM  Margins Unattached edges (unapproximated) 11/15/2017 12:09 PM  Closure None;Sutures 11/15/2017 12:09 PM  Drainage Amount Minimal 11/15/2017 12:09 PM  Drainage Description Sanguineous 11/15/2017 12:09 PM  Treatment Hydrotherapy (Pulse lavage);Packing (Impregnated strip) 11/15/2017 12:09 PM     Wound / Incision (Open or Dehisced) 11/14/17 Incision - Open Foot Right (Active)  Wound Image    11/14/2017 12:58 PM  Dressing Type Compression wrap;Gauze (Comment);Moist to dry 11/15/2017 12:09 PM  Dressing Changed Changed 11/15/2017 12:09 PM  Dressing Status Clean;Dry;Intact 11/15/2017 12:09 PM  Dressing Change Frequency Daily 11/15/2017 12:09 PM  Site / Wound Assessment Red;Painful 11/15/2017 12:09 PM  % Wound base Red or Granulating 100% 11/15/2017 12:09 PM  % Wound base Yellow/Fibrinous Exudate 0% 11/15/2017 12:09 PM  % Wound base Black/Eschar 0% 11/15/2017 12:09 PM  % Wound base Other/Granulation Tissue (Comment) 0% 11/15/2017 12:09 PM  Peri-wound Assessment Intact;Erythema (blanchable) 11/15/2017 12:09 PM  Margins Unattached edges (unapproximated) 11/15/2017 12:09 PM  Closure None 11/15/2017 12:09 PM  Drainage Amount Minimal 11/15/2017 12:09 PM  Drainage Description Sanguineous 11/15/2017 12:09 PM  Treatment Hydrotherapy (Pulse lavage);Packing (Impregnated strip) 11/15/2017 12:09 PM   Hydrotherapy Pulsed lavage therapy - wound location: R hand/forearm, L hand, R foot Pulsed Lavage with Suction (psi): 4 psi Pulsed Lavage with Suction - Normal Saline Used: 1000 mL(x3) Pulsed Lavage Tip: Tip with splash shield   Wound Assessment and Plan  Wound Therapy - Assess/Plan/Recommendations Wound Therapy -  Clinical Statement: Pt reports pain throughout treatment session however was able to tolerate well without IV pain medication. Wife educated regarding dressing changes at home and demonstrated competence with therapist supervision. Wife reports no questions at end of session - "No, I don't have questions. I think I have it down." Continue to feel HHRN would be beneficial s/p discharge to assist. From a hydrotherapy standpoint this pt is ready for discharge as wounds are clean and wife is able to complete dressing changes without assistance. Will continue to follow until d/c.  Wound Therapy - Functional Problem List: Decreased strength and AROM of B hands and R foot as well as acute pain consistent with above mentioned mechanism of injury.  Factors Delaying/Impairing Wound Healing: Infection - systemic/local Hydrotherapy Plan: Debridement;Dressing change;Patient/family education;Pulsatile lavage with suction Wound Therapy - Frequency: 6X / week Wound Therapy - Follow Up Recommendations: Home health RN Wound Plan: See above  Wound Therapy Goals- Improve the function of patient's integumentary system by progressing the wound(s) through the phases of wound healing (inflammation - proliferation - remodeling) by: Patient/Family will be able to : demonstrate dressing changes on x3 extremities with supervision from therapist.  Patient/Family Instruction Goal - Progress: Partly met Goals/treatment plan/discharge plan were made with and agreed upon by patient/family: Yes Time For Goal Achievement: 7 days Wound Therapy - Potential for Goals: Excellent  Goals will be updated until maximal potential achieved or discharge criteria met.  Discharge criteria: when goals achieved, discharge from hospital, MD decision/surgical intervention, no progress towards goals, refusal/missing three consecutive treatments without notification or medical reason.  GP     Thelma Comp 11/15/2017, 12:21 PM  Rolinda Roan,  PT, DPT Acute Rehabilitation Services Pager: [phone] Office: [phone]

## 2017-11-15 NOTE — Care Management Note (Signed)
Case Management Note  Patient Details  Name: BALRAJ BRAYFIELD MRN: [id number] Date of Birth: [birth year]  Subjective/Objective:  62 yr old gentleman admitted with multiple dog bites, right ankle, both hands, chest and lateral abdomen. Patient has had I & D x2, revision of amputation right finger, fasciotomy of right arm.                  Action/Plan: Case manager spoke with patient and his wife concerning discharge plan. Referral for Home Health was called to Shon Millet, [facility] Liaison. Mrs. Preece is being taught by therapist how to provide wound care to sites.    Expected Discharge Date:  pending             Expected Discharge Plan:  Home w Home Health Services  In-House Referral:  NA  Discharge planning Services  CM Consult  Post Acute Care Choice:  Home Health Choice offered to:  Patient, Spouse  DME Arranged:  Scientific laboratory technician, [employer] fast streamer DME Agency:  [facility]  HH Arranged:  RN HH Agency:  [facility]  Status of Service:  In process, will continue to follow  If discussed at Long Length of Stay Meetings, dates discussed:    Additional Comments:  Durenda Guthrie, RN 11/15/2017, 1:18 PM

## 2017-11-16 LAB — CBC
HCT: 30.6 % — ABNORMAL LOW (ref 39.0–52.0)
HEMOGLOBIN: 10.1 g/dL — AB (ref 13.0–17.0)
MCH: 28.4 pg (ref 26.0–34.0)
MCHC: 33 g/dL (ref 30.0–36.0)
MCV: 86 fL (ref 78.0–100.0)
PLATELETS: 244 10*3/uL (ref 150–400)
RBC: 3.56 MIL/uL — ABNORMAL LOW (ref 4.22–5.81)
RDW: 14.6 % (ref 11.5–15.5)
WBC: 4.7 10*3/uL (ref 4.0–10.5)

## 2017-11-16 LAB — COMPREHENSIVE METABOLIC PANEL
ALBUMIN: 3.1 g/dL — AB (ref 3.5–5.0)
ALK PHOS: 130 U/L — AB (ref 38–126)
ALT: 70 U/L — ABNORMAL HIGH (ref 0–44)
ANION GAP: 7 (ref 5–15)
AST: 51 U/L — ABNORMAL HIGH (ref 15–41)
BUN: 10 mg/dL (ref 8–23)
CHLORIDE: 106 mmol/L (ref 98–111)
CO2: 27 mmol/L (ref 22–32)
Calcium: 8.9 mg/dL (ref 8.9–10.3)
Creatinine, Ser: 1.09 mg/dL (ref 0.61–1.24)
GFR calc Af Amer: >60 mL/min (ref >60)
GFR calc non Af Amer: >60 mL/min (ref >60)
GLUCOSE: 135 mg/dL — AB (ref 70–99)
POTASSIUM: 3.7 mmol/L (ref 3.5–5.1)
SODIUM: 140 mmol/L (ref 135–145)
Total Bilirubin: 0.6 mg/dL (ref 0.3–1.2)
Total Protein: 6.3 g/dL — ABNORMAL LOW (ref 6.5–8.1)

## 2017-11-16 LAB — HEPATITIS PANEL, ACUTE
Hep A IgM: NEGATIVE
Hep B C IgM: NEGATIVE
Hepatitis B Surface Ag: NEGATIVE

## 2017-11-16 MED ORDER — OXYCODONE HCL 5 MG PO TABS: 5.0000 mg | ORAL_TABLET | ORAL | 0 refills | Status: AC | PRN

## 2017-11-16 MED ORDER — ONDANSETRON 8 MG PO TBDP: 8.0000 mg | ORAL_TABLET | Freq: Three times a day (TID) | ORAL | 0 refills | Status: DC | PRN

## 2017-11-16 MED ORDER — GABAPENTIN 300 MG PO CAPS: 600.0000 mg | ORAL_CAPSULE | Freq: Three times a day (TID) | ORAL | Status: DC

## 2017-11-16 NOTE — Discharge Instructions (Signed)
Animal Bite Animal bites can range from mild to serious. An animal bite can result in a scratch on the skin, a deep open cut, a puncture of the skin, a crush injury, or tearing away of the skin or a body part. A small bite from a house pet will usually not cause serious problems. However, some animal bites can become infected or injure a bone or other tissue. Bites from certain animals can be more dangerous because of the risk of spreading rabies, which is a serious viral infection. This risk is higher with bites from stray animals or wild animals, such as raccoons, foxes, skunks, and bats. Dogs are responsible for most animal bites. Children are bitten more often than adults. What are the signs or symptoms? Common symptoms of an animal bite include:  Pain.  Bleeding.  Swelling.  Bruising.  How is this diagnosed? This condition may be diagnosed based on a physical exam and medical history. Your health care provider will examine the wound and ask for details about the animal and how the bite happened. You may also have tests, such as:  Blood tests to check for infection or to determine if surgery is needed.  X-rays to check for damage to bones or joints.  Culture test. This uses a sample of fluid from the wound to check for infection.  How is this treated? Treatment varies depending on the location and type of animal bite and your medical history. Treatment may include:  Wound care. This often includes cleaning the wound, flushing the wound with saline solution, and applying a bandage (dressing). Sometimes, the wound is left open to heal because of the high risk of infection. However, in some cases, the wound may be closed with stitches (sutures), staples, skin glue, or adhesive strips.  Antibiotic medicine.  Tetanus shot.  Rabies treatment if the animal could have rabies.  In some cases, bites that have become infected may require IV antibiotics and surgical treatment in the  hospital. Follow these instructions at home: Wound care  Follow instructions from your health care provider about how to take care of your wound. Make sure you: ? Wash your hands with soap and water before you change your dressing. If soap and water are not available, use hand sanitizer. ? Change your dressing as told by your health care provider. ? Leave sutures, skin glue, or adhesive strips in place. These skin closures may need to be in place for 2 weeks or longer. If adhesive strip edges start to loosen and curl up, you may trim the loose edges. Do not remove adhesive strips completely unless your health care provider tells you to do that.  Check your wound every day for signs of infection. Watch for: ? Increasing redness, swelling, or pain. ? Fluid, blood, or pus. General instructions  Take or apply over-the-counter and prescription medicines only as told by your health care provider.  If you were prescribed an antibiotic, take or apply it as told by your health care provider. Do not stop using the antibiotic even if your condition improves.  Keep the injured area raised (elevated) above the level of your heart while you are sitting or lying down, if this is possible.  If directed, apply ice to the injured area. ? Put ice in a plastic bag. ? Place a towel between your skin and the bag. ? Leave the ice on for 20 minutes, 2-3 times per day.  Keep all follow-up visits as told by your health care   provider. This is important. Contact a health care provider if:  You have increasing redness, swelling, or pain at the site of your wound.  You have a general feeling of sickness (malaise).  You feel nauseous or you vomit.  You have pain that does not get better. Get help right away if:  You have a red streak extending away from your wound.  You have fluid, blood, or pus coming from your wound.  You have a fever or chills.  You have trouble moving your injured area.  You have  numbness or tingling extending beyond the wound. This information is not intended to replace advice given to you by your health care provider. Make sure you discuss any questions you have with your health care provider. Document Released: 11/15/2010 Document Revised: 07/07/2015 Document Reviewed: 07/15/2014 Elsevier Interactive Patient Education  2018 Elsevier Inc.  

## 2017-11-16 NOTE — Discharge Summary (Signed)
Discharge Summary  TAVARUS POTEETE WJX:[id number] DOB: [birth year]  PCP: [name], MD  Admit date: 11/10/2017 Discharge date: 11/16/2017  Time spent: , more than 50% time spent on coordination of care.  Recommendations for Outpatient Follow-up:  1. F/u with PMD within a week  for hospital discharge follow up, repeat cbc/bmp at follow up 2. F/u with ortho Dr Amanda Pea on 9/9 3. F/u with [facility] for chronic pancreatitis 4. Home health arranged  Discharge Diagnoses:  Active Hospital Problems   Diagnosis Date Noted  . Dog bite of multiple sites 11/10/2017  . Leukocytosis 11/10/2017  . Pseudocyst, pancreas 02/03/2013  . 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 Hypothyroidism 06/07/2012  . Hypokalemia 05/30/2012    Resolved Hospital Problems  No resolved problems to display.    Discharge Condition: stable  Diet recommendation: low fat diet  Filed Weights   11/10/17 1943 11/12/17 1006  Weight: 70.8 kg 70.8 kg    History of present illness: (per admitting MD Dr Adela Glimpse) HPI: GIOVANI NEUMEISTER is a 62 y.o. 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 Family states that he was at home when neighbors dogs approached got in a fight with his dog he was trying to separate a fight and his dog attacked him biting him repeatedly on his hands feet side of abdomen. Denied alcohol was involved He was evaluated in the emergency department has been seen by Gramig who will take patient to OR Started on Cipro and clindamycin and given fentanyl and morphine for pain with good pain control Regarding pertinent Chronic problems: HTn now controled not on medications, patient's family denies coronary artery disease although it is listed in medical  problems. Has been followed at [facility] for history of 20 pancreatitis with episode of necrotizing pancreatitis in the past coast was unknown but possibly alcohol induced although have not been drinking for the past 6 years status post endoscopic necrosectomy in 2014 with resolution of the pseudocyst necrotic area at the time. 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  Cindee Lame take patient to OR immediately postop admit to internal medicine Will see   in ER  Hospitalist was called for admission for dog bite extensive wounds  Hospital Course:  Principal Problem:   Dog bite of multiple sites Active Problems:   Hypokalemia   Hypothyroidism   Common bile duct (CBD) ?stricture   Pseudocyst, pancreas   Leukocytosis  Dog bite, multiples sites, right ankle, both hands, chest , abdomen lateral. -Underwent I and D 8-31. : revision amputation right finger, proximal phalange level. Fasciotomy right arm, left thumb irrigation and debridement extensor apparatus. Dorsal hand irrigation, open treatment of proximal phalange left hand, left ring finger ulnar and radial digital nerve neurolysis. Irrigation and debridement right foot.  -repeat I/D in OR again on 9-02 -.Appreciate orthopedics Dr Cliffton Asters Help.  -patient received IV antibiotics (ciparo/clinda) due to h/o pcn allergy -patient started first hydrotherapy on 9/4, -improving, he is cleared to d/c home by ortho with cipro/clinda for 14days, home health RN for wound care, close follow up with Dr Amanda Pea on Monday 9/9. -pain control is better, he is discharged with  oxycodone 5mg  q4hrs prn for total of 5 days, further opiods analgesics  per pcp/ortho   Right ankle pain and hip pain;  X ray negative  Mild elevated lft,  H/o pancreatitis improving F/u with [facility]  Hypokalemia:  Replaced, normalized. He also received iv magx1   Hypothyroidism: continue synthroid  BPH, continue  flomax  Constipation: start stool softener , resolved   H/o CVA 2009 w impaired memory and mild residual right side weakness, walks with a cane.  Anemia of chronic disease hgb stable at baseline around 10  Code Status: full  Family Communication: patient and wife at bedside  Disposition Plan: home with home health    Consultants:  orthopedics  Procedures:  As above  Antibiotics:  As above   Discharge Exam: BP 135/75 (BP Location: Left Leg)   Pulse 66   Temp 98.5 F (36.9 C) (Oral)   Resp 18   Ht 5' 7.01" (1.702 m)   Wt 70.8 kg   SpO2 98%   BMI 24.43 kg/m     General:  NAD  Cardiovascular: RRR  Respiratory: CTABL  Abdomen: Soft/ND/NT, positive BS  Musculoskeletal: Right ankle with dressing. Both hands with dressing  Neuro: alert, oriented  Skin: abrasion right side abdomen,   Discharge Instructions You were cared for by a hospitalist during your hospital stay. If you have any questions about your discharge medications or the care you received while you were in the hospital after you are discharged, you can call the unit and asked to speak with the hospitalist on call if the hospitalist that took care of you is not available. Once you are discharged, your primary care physician will handle any further medical issues. Please note that NO REFILLS for any discharge medications will be authorized once you are discharged, as it is imperative that you return to your primary care physician (or establish a relationship with a primary care physician if you do not have one) for your aftercare needs so that they can reassess your need for medications and monitor your lab values.  Discharge Instructions    Diet general   Complete by:  As directed    Increase activity slowly   Complete by:  As directed      Allergies as of 11/16/2017      Reactions   Amoxicillin Hives, Swelling   Dilaudid [hydromorphone Hcl] Swelling   Hydromorphone Swelling       Medication List    STOP taking these medications   dicyclomine 10 MG capsule Commonly known as:  BENTYL   HYDROcodone-acetaminophen 5-325 MG tablet Commonly known as:  NORCO/VICODIN   oxyCODONE-acetaminophen 5-325 MG tablet Commonly known as:  PERCOCET/ROXICET   tiZANidine 4 MG tablet Commonly known as:  ZANAFLEX     TAKE these medications   aspirin 81 MG chewable tablet Chew 81 mg by mouth daily.   baclofen 10 MG tablet Commonly known as:  LIORESAL Take 10 mg by mouth 3 (three) times daily as needed for muscle spasms.   ciprofloxacin 500 MG tablet Commonly known as:  CIPRO Take 1 tablet (500 mg total) by mouth 2 (two) times daily for 14 days.   clindamycin 300 MG capsule Commonly known as:  CLEOCIN Take 1 capsule (300 mg total) by mouth 3 (three) times daily for 14 days.   clonazePAM 0.5 MG tablet Commonly known as:  KLONOPIN Take 0.5 mg by mouth at bedtime. *May take one tablet three times daily as needed for anxiety*   CREON 6000 units Cpep Generic drug:  Pancrelipase (Lip-Prot-Amyl) Take 2-4 capsules by mouth  5 (five) times daily. Patient takes 4 capsules by mouth three times a day with meals and 2 capsules by mouth twice a day with snacks   DULoxetine 60 MG capsule Commonly known as:  CYMBALTA Take 60 mg by mouth every morning.   Fenofibric Acid 105 MG Tabs Take 105 mg by mouth at bedtime.   Flax Oil-Fish Oil-Borage Oil Caps Take 1 capsule by mouth daily.   gabapentin 600 MG tablet Commonly known as:  NEURONTIN Take 600 mg by mouth 3 (three) times daily.   ibuprofen 800 MG tablet Commonly known as:  ADVIL,MOTRIN Take 800 mg by mouth daily as needed for moderate pain.   levothyroxine 112 MCG tablet Commonly known as:  SYNTHROID, LEVOTHROID Take 112 mcg by mouth every morning.   MULTIVITAMIN ADULT PO Take 1 tablet by mouth daily.   ondansetron 8 MG disintegrating tablet Commonly known as:  ZOFRAN-ODT Take 1 tablet (8 mg total) by mouth every 8  (eight) hours as needed for nausea or vomiting.   oxyCODONE 5 MG immediate release tablet Commonly known as:  Oxy IR/ROXICODONE Take 1 tablet (5 mg total) by mouth every 4 (four) hours as needed for up to 5 days for severe pain. What changed:  reasons to take this   pantoprazole 40 MG tablet Commonly known as:  PROTONIX Take 1 tablet (40 mg total) by mouth daily.   polyethylene glycol packet Commonly known as:  MIRALAX / GLYCOLAX Take 17 g by mouth daily as needed for mild constipation.   PROAIR HFA 108 (90 Base) MCG/ACT inhaler Generic drug:  albuterol Inhale 1-2 puffs into the lungs every 6 (six) hours as needed for wheezing or shortness of breath.   saccharomyces boulardii 250 MG capsule Commonly known as:  FLORASTOR Take 1 capsule (250 mg total) by mouth 2 (two) times daily for 14 days.   tamsulosin 0.4 MG Caps capsule Commonly known as:  FLOMAX Take 0.4 mg by mouth 2 (two) times daily.   Vitamin D3 1000 units Caps Take 1,000 Units by mouth.            Durable Medical Equipment  (From admission, onward)         Start     Ordered   11/15/17 1205  For home use only DME Walker rolling  Once    Question:  Patient needs a walker to treat with the following condition  Answer:  Generalized weakness   11/15/17 1205   11/15/17 1205  For home use only DME Walker platform  Once    Comments:  Bilateral platforms  Question:  Patient needs a walker to treat with the following condition  Answer:  Arm injuries   11/15/17 1205         Allergies  Allergen Reactions  . Amoxicillin Hives and Swelling  . Dilaudid [Hydromorphone Hcl] Swelling  . Hydromorphone Swelling   Follow-up Information    Dominica Severin, MD Follow up on 11/19/2017.   Specialty:  Orthopedic Surgery Contact information: [address] [place] [address] [phone]        [name], MD Follow up in 1 week(s).   Specialty:  Internal Medicine Why:  hospital discharge follow  up, repeat cbc/bmp at follow up. Contact information: [address] [place] [address] [phone]        [facility] Follow up.   Specialty:  Home Health Services Why:  A representative from [facility] will contact you to arrange  start date and time for your Nurse to visit. Contact information: [address] [place] [address] [phone]        Larey Dresser, PA-C Follow up in 3 week(s).   Specialty:  Gastroenterology Contact information: [address] [facility] [address] [place] [address] [phone]            The results of significant diagnostics from this hospitalization (including imaging, microbiology, ancillary and laboratory) are listed below for reference.    Significant Diagnostic Studies: Dg Ribs Unilateral Right  Result Date: 11/11/2017 CLINICAL DATA:  Attacked by a pit bull yesterday.  Right rib pain. EXAM: RIGHT RIBS - 2 VIEW COMPARISON:  None. FINDINGS: No fracture or other bone lesions are seen involving the ribs. No hemothorax or pneumothorax. Atelectasis in both lower lobes. IMPRESSION: 1. No evident fracture. 2. Bilateral lower lobe atelectasis. Electronically Signed   By: Marnee Spring M.D.   On: 11/11/2017 13:14   Dg Ankle Complete Right  Result Date: 11/11/2017 CLINICAL DATA:  Posttraumatic right ankle pain.  Initial encounter. EXAM: RIGHT ANKLE - COMPLETE 3+ VIEW COMPARISON:  None. FINDINGS: Lateral soft tissue swelling. There is no evidence of fracture, dislocation, or joint effusion. Heel spur. Arterial calcification IMPRESSION: Soft tissue swelling without fracture. Electronically Signed   By: Marnee Spring M.D.   On: 11/11/2017 13:15   Ct Abdomen Pelvis W Contrast  Result Date: 11/11/2017 CLINICAL DATA:  Penetrating abdominal trauma. Attacked by pit bull. Right flank injury. EXAM: CT ABDOMEN AND PELVIS WITH CONTRAST TECHNIQUE: Multidetector CT imaging of the abdomen and pelvis was performed using the  standard protocol following bolus administration of intravenous contrast. CONTRAST:  ISOVUE-300 IOPAMIDOL (ISOVUE-300) INJECTION 61% COMPARISON:  07/23/2017 FINDINGS: Lower chest:  Minimal atelectasis.  No evidence of injury Hepatobiliary: Chronic intrahepatic biliary duct dilatation. Cholecystectomy.No evidence of liver injury. Pancreas: Chronic volume loss and decreased enhancement in the midline pancreas where the pancreas is indistinguishable from the stomach. Patient has history of pancreatitis and pseudocyst drainage. A cyst was seen in this location in 2017. Proximal to this level the main pancreatic duct is chronically dilated to 3-4 mm. Annular pancreas. No evidence of acute pancreatitis. Spleen: Unremarkable. Adrenals/Urinary Tract: Negative adrenals. Small bilateral renal calculi measuring up to 4 mm on the left. Unremarkable bladder. Stomach/Bowel: No obstruction. No appendicitis. Colonic diverticulosis. 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 No acute arterial finding. No mass or adenopathy. Reproductive:No pathologic findings. Other: No ascites or pneumoperitoneum. Shallow wide necked periumbilical hernia containing fat and nonobstructed small bowel. Musculoskeletal: Stranding in the right flank without collection. Advanced lower lumbar facet arthropathy with L4-5 and L3-4 degenerative disc narrowing. No acute osseous finding. IMPRESSION: 1. Soft tissue injury in the right flank without evidence of intraperitoneal or intrathoracic extension. Negative for fracture. 2. History of pancreatitis and pseudocyst with unchanged scar-like appearance in the midline pancreatic body with proximal duct dilatation. 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 3. Annular pancreas. 4. Chronic intrahepatic bile duct dilatation. 5. Nephrolithiasis and colonic  diverticulosis. Electronically Signed   By: Marnee Spring M.D.   On: 11/11/2017 21:36   Dg Hip Unilat With Pelvis 2-3 Views Right  Result Date: 11/11/2017 CLINICAL DATA:  62 year old male status post dog attack EXAM: DG HIP (WITH OR WITHOUT PELVIS) 2-3V RIGHT COMPARISON:  None. FINDINGS: There is no evidence of hip fracture or dislocation. There is no evidence  of arthropathy or other focal bone abnormality. IMPRESSION: Negative. Electronically Signed   By: Malachy Moan M.D.   On: 11/11/2017 13:04    Microbiology: Recent Results (from the past 240 hour(s))  Surgical pcr screen     Status: None   Collection Time: 11/11/17  9:22 PM  Result Value Ref Range Status   MRSA, PCR NEGATIVE NEGATIVE Final   Staphylococcus aureus NEGATIVE NEGATIVE Final    Comment: (NOTE) The Xpert SA Assay (FDA approved for NASAL specimens in patients 46 years of age and older), is one component of a comprehensive surveillance program. It is not intended to diagnose infection nor to guide or monitor treatment. Performed at [facility], [address] [place], [place] [address]      Labs: Basic Metabolic Panel: Recent Labs  Lab 11/11/17 [phone] 11/12/17 0759 11/13/17 0802 11/14/17 0348 11/15/17 0435 11/16/17 0442  NA 141 141 140 143 141 140  K 3.2* 3.9 3.8 3.6 3.6 3.7  CL 108 111 110 115* 108 106  CO2 27 23 24 24 26 27   GLUCOSE 140* 118* 211* 145* 120* 135*  BUN 13 5* 6* 8 9 10   CREATININE 1.02 0.98 0.97 0.98 1.00 1.09  CALCIUM 7.8* 8.3* 8.6* 8.4* 8.7* 8.9  MG 1.7  --   --   --  1.8  --   PHOS 3.1  --   --   --   --   --    Liver Function Tests: Recent Labs  Lab 11/12/17 0759 11/13/17 0802 11/14/17 0348 11/15/17 0435 11/16/17 0442  AST 38 25 16 26  51*  ALT 85* 72* 52* 55* 70*  ALKPHOS 53 57 50 81 130*  BILITOT 0.6 0.5 0.3 0.6 0.6  PROT 5.5* 5.7* 5.7* 5.8* 6.3*  ALBUMIN 2.8* 2.8* 2.7* 2.9* 3.1*   Recent Labs  Lab 11/15/17 0435  LIPASE 33   No results for input(s):  AMMONIA in the last 168 hours. CBC: Recent Labs  Lab 11/10/17 2108  11/12/17 0759 11/13/17 0802 11/14/17 0348 11/15/17 0435 11/16/17 0442  WBC 12.7*   < > 5.7 6.7 4.5 4.8 4.7  NEUTROABS 11.0*  --   --   --   --  3.2  --   HGB 10.7*   < > 9.7* 8.7* 8.2* 9.3* 10.1*  HCT 32.6*   < > 29.7* 26.8* 25.1* 28.2* 30.6*  MCV 87.4   < > 87.4 87.3 88.4 86.5 86.0  PLT 168   < > 142* 146* 161 231 244   < > = values in this interval not displayed.   Cardiac Enzymes: No results for input(s): CKTOTAL, CKMB, CKMBINDEX, TROPONINI in the last 168 hours. BNP: BNP (last 3 results) No results for input(s): BNP in the last 8760 hours.  ProBNP (last 3 results) No results for input(s): PROBNP in the last 8760 hours.  CBG: Recent Labs  Lab 11/12/17 0944  GLUCAP 177*       Signed:  Albertine Grates MD, PhD  [facility] 11/16/2017, 9:23 AM

## 2017-11-16 NOTE — Plan of Care (Signed)
  Problem: Health Behavior/Discharge Planning: Goal: Ability to manage health-related needs will improve Outcome: Adequate for Discharge   Problem: Activity: Goal: Risk for activity intolerance will decrease Outcome: Adequate for Discharge   Problem: Nutrition: Goal: Adequate nutrition will be maintained Outcome: Adequate for Discharge   Problem: Pain Managment: Goal: General experience of comfort will improve Outcome: Adequate for Discharge   Problem: Safety: Goal: Ability to remain free from injury will improve Outcome: Adequate for Discharge   Problem: Skin Integrity: Goal: Risk for impaired skin integrity will decrease Outcome: Adequate for Discharge   Problem: Spiritual Needs Goal: Ability to function at adequate level Outcome: Adequate for Discharge

## 2018-09-04 ENCOUNTER — Other Ambulatory Visit: Payer: Self-pay

## 2018-09-04 ENCOUNTER — Ambulatory Visit: Payer: Medicare Other | Admitting: Orthopaedic Surgery

## 2018-09-10 ENCOUNTER — Ambulatory Visit: Payer: Medicare Other | Admitting: Orthopaedic Surgery

## 2018-09-17 ENCOUNTER — Other Ambulatory Visit: Payer: Self-pay

## 2018-09-17 ENCOUNTER — Ambulatory Visit (INDEPENDENT_AMBULATORY_CARE_PROVIDER_SITE_OTHER): Payer: Medicare Other

## 2018-09-17 ENCOUNTER — Encounter: Payer: Self-pay | Admitting: Orthopaedic Surgery

## 2018-09-17 ENCOUNTER — Ambulatory Visit (INDEPENDENT_AMBULATORY_CARE_PROVIDER_SITE_OTHER): Payer: Medicare Other | Admitting: Orthopaedic Surgery

## 2018-09-17 VITALS — BP 122/75 | HR 63 | Temp 97.9°F | Ht 67.0 in | Wt 157.0 lb

## 2018-09-17 DIAGNOSIS — M25562 Pain in left knee: Secondary | ICD-10-CM | POA: Diagnosis not present

## 2018-09-17 DIAGNOSIS — G8929 Other chronic pain: Secondary | ICD-10-CM

## 2018-09-17 NOTE — Progress Notes (Signed)
Subjective:    Patient ID: Justin [name], male    DOB: Jan 25, 1956, 63 y.o.   MRN: [id number]  HPI He has left knee pain.  He had an injury about two years ago and since then the knee has bothered him on and off.  He started having more pain about two months ago.   He was seen at [facility] then.  He continued to have pain and swelling.  He was seen again there on 08-27-2018 and had injection into the knee and put on ibuprofen.  He continues to have pain and now has giving way of the knee.  He has no redness, no numbness, no new trauma.  I received his notes after he had left but reviewed then.   Review of Systems  Constitutional: Positive for activity change.  Respiratory: Positive for shortness of breath.   Musculoskeletal: Positive for arthralgias, gait problem and joint swelling.  All other systems reviewed and are negative.  For Review of Systems, all other systems reviewed and are negative.  The following is a summary of the past history medically, past history surgically, known current medicines, social history and family history.  This information is gathered electronically by the computer from prior information and documentation.  I review this each visit and have found including this information at this point in the chart is beneficial and informative.   Past Medical History:  Diagnosis Date  . Alcoholism ([place])   . Asthma   . Back pain   . Fibromyalgia   . GERD (gastroesophageal reflux disease)   . High cholesterol   . Hypertension   . Hypothyroidism   . Myocardial infarct [facility])    cardiologist in town  . Pancreatitis    with pseudocysts  . Pneumonia   . Stroke [facility])    right leg weakness    Past Surgical History:  Procedure Laterality Date  . BACK SURGERY    . CHOLECYSTECTOMY  2009  . FINGER SURGERY    . I&D EXTREMITY Bilateral 11/10/2017   Procedure: IRRIGATION AND DEBRIDEMENT, BILATERAL UPPER HANDS AND RIGHT FOOT. REVISION AMPUTATION RIGHT RING  FINGER.;  Surgeon: Roseanne Kaufman, MD;  Location: [place];  Service: Orthopedics;  Laterality: Bilateral;  . I&D EXTREMITY Bilateral 11/12/2017   Procedure: IRRIGATION AND DEBRIDEMENT RIGHT AND LEFT HANDS;  Surgeon: Roseanne Kaufman, MD;  Location: [place];  Service: Orthopedics;  Laterality: Bilateral;  . INCISION AND DRAINAGE HIP Right 11/12/2017   Procedure: IRRIGATION AND DEBRIDEMENT RIGHT FOOT;  Surgeon: Roseanne Kaufman, MD;  Location: [place];  Service: Orthopedics;  Laterality: Right;  . PANCREATIC PSEUDOCYST DRAINAGE  2014    Current Outpatient Medications on File Prior to Visit  Medication Sig Dispense Refill  . albuterol (PROAIR HFA) 108 (90 Base) MCG/ACT inhaler Inhale 1-2 puffs into the lungs every 6 (six) hours as needed for wheezing or shortness of breath.    Marland Kitchen aspirin 81 MG chewable tablet Chew 81 mg by mouth daily.     . baclofen (LIORESAL) 10 MG tablet Take 10 mg by mouth 3 (three) times daily as needed for muscle spasms.   0  . Cholecalciferol (VITAMIN D3) 1000 units CAPS Take 1,000 Units by mouth.     . clonazePAM (KLONOPIN) 0.5 MG tablet Take 0.5 mg by mouth at bedtime. *May take one tablet three times daily as needed for anxiety*    . DULoxetine (CYMBALTA) 60 MG capsule Take 60 mg by mouth every morning.     Marland Kitchen  Fenofibric Acid 105 MG TABS Take 105 mg by mouth at bedtime.     . Flax Oil-Fish Oil-Borage Oil CAPS Take 1 capsule by mouth daily.    Marland Kitchen gabapentin (NEURONTIN) 600 MG tablet Take 600 mg by mouth 3 (three) times daily.    Marland Kitchen ibuprofen (ADVIL,MOTRIN) 800 MG tablet Take 800 mg by mouth daily as needed for moderate pain.   2  . levothyroxine (SYNTHROID, LEVOTHROID) 112 MCG tablet Take 112 mcg by mouth every morning.     . Multiple Vitamins-Minerals (MULTIVITAMIN ADULT PO) Take 1 tablet by mouth daily.    . ondansetron (ZOFRAN ODT) 8 MG disintegrating tablet Take 1 tablet (8 mg total) by mouth every 8 (eight) hours as needed for nausea or vomiting. 5 tablet 0  . Pancrelipase,  Lip-Prot-Amyl, (CREON) 6000 units CPEP Take 2-4 capsules by mouth 5 (five) times daily. Patient takes 4 capsules by mouth three times a day with meals and 2 capsules by mouth twice a day with snacks    . pantoprazole (PROTONIX) 40 MG tablet Take 1 tablet (40 mg total) by mouth daily. 30 tablet 1  . polyethylene glycol (MIRALAX / GLYCOLAX) packet Take 17 g by mouth daily as needed for mild constipation.     . tamsulosin (FLOMAX) 0.4 MG CAPS capsule Take 0.4 mg by mouth 2 (two) times daily.      No current facility-administered medications on file prior to visit.     Social History   Socioeconomic History  . Marital status: Married    Spouse name: Not on file  . Number of children: 60  . Years of education: Not on file  . Highest education level: Not on file  Occupational History  . Not on file  Social Needs  . Financial resource strain: Not on file  . Food insecurity    Worry: Not on file    Inability: Not on file  . Transportation needs    Medical: Not on file    Non-medical: Not on file  Tobacco Use  . Smoking status: Former Smoker    Packs/day: 0.12    Years: 5.00    Pack years: 0.60    Types: Cigarettes  . Smokeless tobacco: Never Used  . Tobacco comment: 11/12/2017 "quit smoking in the 1980s/1990s"  Substance and Sexual Activity  . Alcohol use: No    Comment: 11/12/2017 previously used alcohol and quit  ~ 2011"  . Drug use: Yes    Types: Marijuana    Comment: daily  . Sexual activity: Not Currently  Lifestyle  . Physical activity    Days per week: Not on file    Minutes per session: Not on file  . Stress: Not on file  Relationships  . Social Musician on phone: Not on file    Gets together: Not on file    Attends religious service: Not on file    Active member of club or organization: Not on file    Attends meetings of clubs or organizations: Not on file    Relationship status: Not on file  . Intimate partner violence    Fear of current or ex partner:  Not on file    Emotionally abused: Not on file    Physically abused: Not on file    Forced sexual activity: Not on file  Other Topics Concern  . Not on file  Social History Narrative   Lives with his wife, he is on disability because of his back  surgery.  Uses a cane to ambulate.      Family History  Problem Relation Age of Onset  . Diabetes Mother   . Heart attack Mother   . Diabetes Brother   . Heart attack Brother   . Brain cancer Brother   . Diabetes Brother   . Cancer Father        ? stomach  . Heart attack Father   . Pancreatitis Neg Hx     BP 122/75   Pulse 63   Temp 97.9 F (36.6 C)   Ht 5\' 7"  (1.702 m)   Wt 157 lb (71.2 kg)   BMI 24.59 kg/m   Body mass index is 24.59 kg/m.      Objective:   Physical Exam Vitals signs reviewed.  Constitutional:      Appearance: He is well-developed.  HENT:     Head: Normocephalic and atraumatic.  Eyes:     Conjunctiva/sclera: Conjunctivae normal.     Pupils: Pupils are equal, round, and reactive to light.  Neck:     Musculoskeletal: Normal range of motion and neck supple.  Cardiovascular:     Rate and Rhythm: Normal rate and regular rhythm.  Pulmonary:     Effort: Pulmonary effort is normal.  Abdominal:     Palpations: Abdomen is soft.  Musculoskeletal:     Left knee: He exhibits decreased range of motion. Tenderness found. Medial joint line tenderness noted.       Legs:  Skin:    General: Skin is warm and dry.  Neurological:     Mental Status: He is alert and oriented to person, place, and time.     Cranial Nerves: No cranial nerve deficit.     Motor: No abnormal muscle tone.     Coordination: Coordination normal.     Deep Tendon Reflexes: Reflexes are normal and symmetric. Reflexes normal.  Psychiatric:        Behavior: Behavior normal.        Thought Content: Thought content normal.        Judgment: Judgment normal.      He did not bring a CD of his x-rays.  I offered to have him go and get them  and see him Thursday or get new films today  He wants new x-rays.  X-rays were done of the left knee, reported separately.  He has significant DJD.     Assessment & Plan:   Encounter Diagnosis  Name Primary?  . Chronic pain of left knee Yes   I am concerned about meniscus tear as he has giving way now.  I will get MRI of the knee.  He did not do well with the injection a few weeks ago and I will not do one today.  Return after MRI.  Continue ibuprofen.  Call if any problem.  Precautions discussed.   Electronically Signed Wednesday, MD 7/7/202011:50 AM

## 2018-09-23 ENCOUNTER — Ambulatory Visit (HOSPITAL_COMMUNITY)
Admission: RE | Admit: 2018-09-23 | Discharge: 2018-09-23 | Disposition: A | Discharge status: Home / Self Care | Payer: Medicare Other | Source: Ambulatory Visit | Attending: Orthopaedic Surgery | Admitting: Orthopaedic Surgery

## 2018-09-23 ENCOUNTER — Other Ambulatory Visit: Payer: Self-pay

## 2018-09-23 DIAGNOSIS — M25562 Pain in left knee: Secondary | ICD-10-CM | POA: Insufficient documentation

## 2018-09-23 DIAGNOSIS — G8929 Other chronic pain: Secondary | ICD-10-CM | POA: Insufficient documentation

## 2018-10-31 ENCOUNTER — Ambulatory Visit (INDEPENDENT_AMBULATORY_CARE_PROVIDER_SITE_OTHER): Payer: Medicare Other | Admitting: Otolaryngology

## 2018-10-31 DIAGNOSIS — H6122 Impacted cerumen, left ear: Secondary | ICD-10-CM

## 2018-10-31 DIAGNOSIS — H61012 Acute perichondritis of left external ear: Secondary | ICD-10-CM

## 2018-11-14 ENCOUNTER — Ambulatory Visit (INDEPENDENT_AMBULATORY_CARE_PROVIDER_SITE_OTHER): Payer: Medicare Other | Admitting: Otolaryngology

## 2019-03-10 ENCOUNTER — Other Ambulatory Visit: Payer: Self-pay

## 2019-03-10 ENCOUNTER — Ambulatory Visit: Payer: Medicare Other | Attending: Internal Medicine

## 2019-03-10 DIAGNOSIS — Z20822 Contact with and (suspected) exposure to covid-19: Secondary | ICD-10-CM

## 2019-03-12 LAB — NOVEL CORONAVIRUS, NAA: SARS-CoV-2, NAA: NOT DETECTED

## 2019-03-19 ENCOUNTER — Telehealth: Payer: Self-pay | Admitting: *Deleted

## 2019-03-19 NOTE — Telephone Encounter (Signed)
Pt notified that his test from Dec 28 th showed that he is negative for covid. Verbal understanding.

## 2019-05-07 DIAGNOSIS — J449 Chronic obstructive pulmonary disease, unspecified: Secondary | ICD-10-CM | POA: Diagnosis not present

## 2019-05-07 DIAGNOSIS — R42 Dizziness and giddiness: Secondary | ICD-10-CM | POA: Diagnosis not present

## 2019-05-07 DIAGNOSIS — L309 Dermatitis, unspecified: Secondary | ICD-10-CM | POA: Diagnosis not present

## 2019-05-07 DIAGNOSIS — Z299 Encounter for prophylactic measures, unspecified: Secondary | ICD-10-CM | POA: Diagnosis not present

## 2019-05-07 DIAGNOSIS — K551 Chronic vascular disorders of intestine: Secondary | ICD-10-CM | POA: Diagnosis not present

## 2019-05-13 DIAGNOSIS — H81393 Other peripheral vertigo, bilateral: Secondary | ICD-10-CM | POA: Diagnosis not present

## 2019-05-27 DIAGNOSIS — Z7189 Other specified counseling: Secondary | ICD-10-CM | POA: Diagnosis not present

## 2019-05-27 DIAGNOSIS — Z299 Encounter for prophylactic measures, unspecified: Secondary | ICD-10-CM | POA: Diagnosis not present

## 2019-05-27 DIAGNOSIS — Z0000 Encounter for general adult medical examination without abnormal findings: Secondary | ICD-10-CM | POA: Diagnosis not present

## 2019-05-27 DIAGNOSIS — Z1211 Encounter for screening for malignant neoplasm of colon: Secondary | ICD-10-CM | POA: Diagnosis not present

## 2019-07-09 DIAGNOSIS — J449 Chronic obstructive pulmonary disease, unspecified: Secondary | ICD-10-CM | POA: Diagnosis not present

## 2019-07-09 DIAGNOSIS — R110 Nausea: Secondary | ICD-10-CM | POA: Diagnosis not present

## 2019-07-09 DIAGNOSIS — E785 Hyperlipidemia, unspecified: Secondary | ICD-10-CM | POA: Diagnosis not present

## 2019-07-09 DIAGNOSIS — K861 Other chronic pancreatitis: Secondary | ICD-10-CM | POA: Diagnosis not present

## 2019-07-09 DIAGNOSIS — Z299 Encounter for prophylactic measures, unspecified: Secondary | ICD-10-CM | POA: Diagnosis not present

## 2019-09-02 DIAGNOSIS — Z299 Encounter for prophylactic measures, unspecified: Secondary | ICD-10-CM | POA: Diagnosis not present

## 2019-09-02 DIAGNOSIS — E039 Hypothyroidism, unspecified: Secondary | ICD-10-CM | POA: Diagnosis not present

## 2019-09-02 DIAGNOSIS — J449 Chronic obstructive pulmonary disease, unspecified: Secondary | ICD-10-CM | POA: Diagnosis not present

## 2019-09-02 DIAGNOSIS — K551 Chronic vascular disorders of intestine: Secondary | ICD-10-CM | POA: Diagnosis not present

## 2019-09-02 DIAGNOSIS — M064 Inflammatory polyarthropathy: Secondary | ICD-10-CM | POA: Diagnosis not present

## 2019-09-12 DIAGNOSIS — Z299 Encounter for prophylactic measures, unspecified: Secondary | ICD-10-CM | POA: Diagnosis not present

## 2019-09-12 DIAGNOSIS — Z789 Other specified health status: Secondary | ICD-10-CM | POA: Diagnosis not present

## 2019-09-12 DIAGNOSIS — K861 Other chronic pancreatitis: Secondary | ICD-10-CM | POA: Diagnosis not present

## 2019-09-12 DIAGNOSIS — M064 Inflammatory polyarthropathy: Secondary | ICD-10-CM | POA: Diagnosis not present

## 2019-09-29 DIAGNOSIS — M069 Rheumatoid arthritis, unspecified: Secondary | ICD-10-CM | POA: Diagnosis not present

## 2019-09-29 DIAGNOSIS — M064 Inflammatory polyarthropathy: Secondary | ICD-10-CM | POA: Diagnosis not present

## 2019-10-10 DIAGNOSIS — E7800 Pure hypercholesterolemia, unspecified: Secondary | ICD-10-CM | POA: Diagnosis not present

## 2019-10-10 DIAGNOSIS — I639 Cerebral infarction, unspecified: Secondary | ICD-10-CM | POA: Diagnosis not present

## 2019-10-10 DIAGNOSIS — M159 Polyosteoarthritis, unspecified: Secondary | ICD-10-CM | POA: Diagnosis not present

## 2019-10-10 DIAGNOSIS — J449 Chronic obstructive pulmonary disease, unspecified: Secondary | ICD-10-CM | POA: Diagnosis not present

## 2019-10-14 DIAGNOSIS — J449 Chronic obstructive pulmonary disease, unspecified: Secondary | ICD-10-CM | POA: Diagnosis not present

## 2019-10-14 DIAGNOSIS — E7800 Pure hypercholesterolemia, unspecified: Secondary | ICD-10-CM | POA: Diagnosis not present

## 2019-10-14 DIAGNOSIS — M159 Polyosteoarthritis, unspecified: Secondary | ICD-10-CM | POA: Diagnosis not present

## 2019-10-14 DIAGNOSIS — I639 Cerebral infarction, unspecified: Secondary | ICD-10-CM | POA: Diagnosis not present

## 2019-10-16 DIAGNOSIS — K861 Other chronic pancreatitis: Secondary | ICD-10-CM | POA: Diagnosis not present

## 2019-10-16 DIAGNOSIS — M069 Rheumatoid arthritis, unspecified: Secondary | ICD-10-CM | POA: Diagnosis not present

## 2019-10-16 DIAGNOSIS — M064 Inflammatory polyarthropathy: Secondary | ICD-10-CM | POA: Diagnosis not present

## 2019-10-16 DIAGNOSIS — Z299 Encounter for prophylactic measures, unspecified: Secondary | ICD-10-CM | POA: Diagnosis not present

## 2019-10-20 DIAGNOSIS — Z299 Encounter for prophylactic measures, unspecified: Secondary | ICD-10-CM | POA: Diagnosis not present

## 2019-10-20 DIAGNOSIS — M25561 Pain in right knee: Secondary | ICD-10-CM | POA: Diagnosis not present

## 2019-12-08 DIAGNOSIS — M064 Inflammatory polyarthropathy: Secondary | ICD-10-CM | POA: Diagnosis not present

## 2019-12-08 DIAGNOSIS — K551 Chronic vascular disorders of intestine: Secondary | ICD-10-CM | POA: Diagnosis not present

## 2019-12-08 DIAGNOSIS — M1710 Unilateral primary osteoarthritis, unspecified knee: Secondary | ICD-10-CM | POA: Diagnosis not present

## 2019-12-08 DIAGNOSIS — Z299 Encounter for prophylactic measures, unspecified: Secondary | ICD-10-CM | POA: Diagnosis not present

## 2019-12-08 DIAGNOSIS — M069 Rheumatoid arthritis, unspecified: Secondary | ICD-10-CM | POA: Diagnosis not present

## 2019-12-11 DIAGNOSIS — I639 Cerebral infarction, unspecified: Secondary | ICD-10-CM | POA: Diagnosis not present

## 2019-12-11 DIAGNOSIS — J449 Chronic obstructive pulmonary disease, unspecified: Secondary | ICD-10-CM | POA: Diagnosis not present

## 2019-12-11 DIAGNOSIS — E7800 Pure hypercholesterolemia, unspecified: Secondary | ICD-10-CM | POA: Diagnosis not present

## 2019-12-11 DIAGNOSIS — M159 Polyosteoarthritis, unspecified: Secondary | ICD-10-CM | POA: Diagnosis not present

## 2019-12-12 DIAGNOSIS — K861 Other chronic pancreatitis: Secondary | ICD-10-CM | POA: Diagnosis not present

## 2019-12-12 DIAGNOSIS — D692 Other nonthrombocytopenic purpura: Secondary | ICD-10-CM | POA: Diagnosis not present

## 2019-12-12 DIAGNOSIS — Z299 Encounter for prophylactic measures, unspecified: Secondary | ICD-10-CM | POA: Diagnosis not present

## 2020-02-10 DIAGNOSIS — J449 Chronic obstructive pulmonary disease, unspecified: Secondary | ICD-10-CM | POA: Diagnosis not present

## 2020-02-10 DIAGNOSIS — E7800 Pure hypercholesterolemia, unspecified: Secondary | ICD-10-CM | POA: Diagnosis not present

## 2020-02-10 DIAGNOSIS — M159 Polyosteoarthritis, unspecified: Secondary | ICD-10-CM | POA: Diagnosis not present

## 2020-02-10 DIAGNOSIS — I639 Cerebral infarction, unspecified: Secondary | ICD-10-CM | POA: Diagnosis not present

## 2020-02-23 DIAGNOSIS — M79641 Pain in right hand: Secondary | ICD-10-CM | POA: Diagnosis not present

## 2020-02-23 DIAGNOSIS — Z299 Encounter for prophylactic measures, unspecified: Secondary | ICD-10-CM | POA: Diagnosis not present

## 2020-02-23 DIAGNOSIS — M069 Rheumatoid arthritis, unspecified: Secondary | ICD-10-CM | POA: Diagnosis not present

## 2020-02-23 DIAGNOSIS — M25562 Pain in left knee: Secondary | ICD-10-CM | POA: Diagnosis not present

## 2020-03-11 DIAGNOSIS — J449 Chronic obstructive pulmonary disease, unspecified: Secondary | ICD-10-CM | POA: Diagnosis not present

## 2020-03-11 DIAGNOSIS — E7800 Pure hypercholesterolemia, unspecified: Secondary | ICD-10-CM | POA: Diagnosis not present

## 2020-03-11 DIAGNOSIS — M159 Polyosteoarthritis, unspecified: Secondary | ICD-10-CM | POA: Diagnosis not present

## 2020-03-11 DIAGNOSIS — I639 Cerebral infarction, unspecified: Secondary | ICD-10-CM | POA: Diagnosis not present

## 2020-03-20 ENCOUNTER — Other Ambulatory Visit: Payer: Self-pay

## 2020-03-20 ENCOUNTER — Emergency Department (HOSPITAL_COMMUNITY)
Admission: EM | Admit: 2020-03-20 | Discharge: 2020-03-20 | Disposition: A | Discharge status: Home / Self Care | Payer: Medicare Other | Source: Home / Self Care | Attending: Emergency Medicine | Admitting: Emergency Medicine

## 2020-03-20 ENCOUNTER — Emergency Department (HOSPITAL_COMMUNITY): Payer: Medicare Other

## 2020-03-20 ENCOUNTER — Encounter (HOSPITAL_COMMUNITY): Payer: Self-pay | Admitting: Emergency Medicine

## 2020-03-20 DIAGNOSIS — I10 Essential (primary) hypertension: Secondary | ICD-10-CM | POA: Insufficient documentation

## 2020-03-20 DIAGNOSIS — Z7982 Long term (current) use of aspirin: Secondary | ICD-10-CM | POA: Insufficient documentation

## 2020-03-20 DIAGNOSIS — K219 Gastro-esophageal reflux disease without esophagitis: Secondary | ICD-10-CM | POA: Insufficient documentation

## 2020-03-20 DIAGNOSIS — Z9049 Acquired absence of other specified parts of digestive tract: Secondary | ICD-10-CM | POA: Diagnosis not present

## 2020-03-20 DIAGNOSIS — K439 Ventral hernia without obstruction or gangrene: Secondary | ICD-10-CM | POA: Diagnosis not present

## 2020-03-20 DIAGNOSIS — I2510 Atherosclerotic heart disease of native coronary artery without angina pectoris: Secondary | ICD-10-CM | POA: Diagnosis not present

## 2020-03-20 DIAGNOSIS — Z8719 Personal history of other diseases of the digestive system: Secondary | ICD-10-CM | POA: Insufficient documentation

## 2020-03-20 DIAGNOSIS — K859 Acute pancreatitis without necrosis or infection, unspecified: Secondary | ICD-10-CM | POA: Insufficient documentation

## 2020-03-20 DIAGNOSIS — R1013 Epigastric pain: Secondary | ICD-10-CM

## 2020-03-20 DIAGNOSIS — E7800 Pure hypercholesterolemia, unspecified: Secondary | ICD-10-CM | POA: Diagnosis not present

## 2020-03-20 DIAGNOSIS — E039 Hypothyroidism, unspecified: Secondary | ICD-10-CM | POA: Insufficient documentation

## 2020-03-20 DIAGNOSIS — Z8673 Personal history of transient ischemic attack (TIA), and cerebral infarction without residual deficits: Secondary | ICD-10-CM | POA: Insufficient documentation

## 2020-03-20 DIAGNOSIS — Z87891 Personal history of nicotine dependence: Secondary | ICD-10-CM | POA: Insufficient documentation

## 2020-03-20 DIAGNOSIS — I8500 Esophageal varices without bleeding: Secondary | ICD-10-CM | POA: Diagnosis not present

## 2020-03-20 DIAGNOSIS — I252 Old myocardial infarction: Secondary | ICD-10-CM | POA: Diagnosis not present

## 2020-03-20 DIAGNOSIS — R112 Nausea with vomiting, unspecified: Secondary | ICD-10-CM | POA: Diagnosis not present

## 2020-03-20 DIAGNOSIS — Z5321 Procedure and treatment not carried out due to patient leaving prior to being seen by health care provider: Secondary | ICD-10-CM | POA: Insufficient documentation

## 2020-03-20 DIAGNOSIS — J45909 Unspecified asthma, uncomplicated: Secondary | ICD-10-CM | POA: Insufficient documentation

## 2020-03-20 DIAGNOSIS — E860 Dehydration: Secondary | ICD-10-CM | POA: Diagnosis not present

## 2020-03-20 DIAGNOSIS — K8681 Exocrine pancreatic insufficiency: Secondary | ICD-10-CM | POA: Diagnosis not present

## 2020-03-20 DIAGNOSIS — Q451 Annular pancreas: Secondary | ICD-10-CM | POA: Diagnosis not present

## 2020-03-20 DIAGNOSIS — R109 Unspecified abdominal pain: Secondary | ICD-10-CM | POA: Diagnosis not present

## 2020-03-20 DIAGNOSIS — K861 Other chronic pancreatitis: Secondary | ICD-10-CM | POA: Diagnosis not present

## 2020-03-20 DIAGNOSIS — M797 Fibromyalgia: Secondary | ICD-10-CM | POA: Diagnosis not present

## 2020-03-20 DIAGNOSIS — Z8249 Family history of ischemic heart disease and other diseases of the circulatory system: Secondary | ICD-10-CM | POA: Diagnosis not present

## 2020-03-20 DIAGNOSIS — F32A Depression, unspecified: Secondary | ICD-10-CM | POA: Diagnosis not present

## 2020-03-20 DIAGNOSIS — Z20822 Contact with and (suspected) exposure to covid-19: Secondary | ICD-10-CM | POA: Diagnosis not present

## 2020-03-20 DIAGNOSIS — K2970 Gastritis, unspecified, without bleeding: Secondary | ICD-10-CM | POA: Diagnosis not present

## 2020-03-20 DIAGNOSIS — R1110 Vomiting, unspecified: Secondary | ICD-10-CM | POA: Diagnosis not present

## 2020-03-20 LAB — COMPREHENSIVE METABOLIC PANEL
ALT: 36 U/L (ref 0–44)
AST: 24 U/L (ref 15–41)
Albumin: 4.4 g/dL (ref 3.5–5.0)
Alkaline Phosphatase: 97 U/L (ref 38–126)
Anion gap: 10 (ref 5–15)
BUN: 10 mg/dL (ref 8–23)
CO2: 23 mmol/L (ref 22–32)
Calcium: 9.3 mg/dL (ref 8.9–10.3)
Chloride: 105 mmol/L (ref 98–111)
Creatinine, Ser: 0.88 mg/dL (ref 0.61–1.24)
GFR, Estimated: >60 mL/min (ref >60)
Glucose, Bld: 123 mg/dL — ABNORMAL HIGH (ref 70–99)
Potassium: 3.5 mmol/L (ref 3.5–5.1)
Sodium: 138 mmol/L (ref 135–145)
Total Bilirubin: 1 mg/dL (ref 0.3–1.2)
Total Protein: 7.9 g/dL (ref 6.5–8.1)

## 2020-03-20 LAB — CBC
HCT: 46.4 % (ref 39.0–52.0)
Hemoglobin: 15.6 g/dL (ref 13.0–17.0)
MCH: 29.3 pg (ref 26.0–34.0)
MCHC: 33.6 g/dL (ref 30.0–36.0)
MCV: 87.2 fL (ref 80.0–100.0)
Platelets: 197 10*3/uL (ref 150–400)
RBC: 5.32 MIL/uL (ref 4.22–5.81)
RDW: 14.1 % (ref 11.5–15.5)
WBC: 5.4 10*3/uL (ref 4.0–10.5)
nRBC: 0 % (ref 0.0–0.2)

## 2020-03-20 LAB — URINALYSIS, ROUTINE W REFLEX MICROSCOPIC
Bilirubin Urine: NEGATIVE
Glucose, UA: NEGATIVE mg/dL
Hgb urine dipstick: NEGATIVE
Ketones, ur: 80 mg/dL — AB
Leukocytes,Ua: NEGATIVE
Nitrite: NEGATIVE
Protein, ur: 30 mg/dL — AB
Specific Gravity, Urine: 1.024 (ref 1.005–1.030)
pH: 5 (ref 5.0–8.0)

## 2020-03-20 LAB — LIPASE, BLOOD: Lipase: 38 U/L (ref 11–51)

## 2020-03-20 MED ORDER — FENTANYL CITRATE (PF) 100 MCG/2ML IJ SOLN
100.0000 ug | Freq: Once | INTRAMUSCULAR | Status: AC
  Administered 2020-03-20: 100 ug via INTRAVENOUS
  Filled 2020-03-20: qty 2

## 2020-03-20 MED ORDER — FAMOTIDINE IN NACL 20-0.9 MG/50ML-% IV SOLN
20.0000 mg | Freq: Once | INTRAVENOUS | Status: AC
  Administered 2020-03-20: 20 mg via INTRAVENOUS
  Filled 2020-03-20: qty 50

## 2020-03-20 MED ORDER — SODIUM CHLORIDE 0.9 % IV BOLUS
1000.0000 mL | Freq: Once | INTRAVENOUS | Status: AC
  Administered 2020-03-20: 1000 mL via INTRAVENOUS

## 2020-03-20 MED ORDER — ONDANSETRON HCL 4 MG/2ML IJ SOLN
4.0000 mg | Freq: Once | INTRAMUSCULAR | Status: AC
  Administered 2020-03-20: 4 mg via INTRAVENOUS
  Filled 2020-03-20: qty 2

## 2020-03-20 MED ORDER — OXYCODONE-ACETAMINOPHEN 5-325 MG PO TABS: 1.0000 | ORAL_TABLET | Freq: Four times a day (QID) | ORAL | 0 refills | Status: DC | PRN

## 2020-03-20 MED ORDER — IOHEXOL 300 MG/ML  SOLN
100.0000 mL | Freq: Once | INTRAMUSCULAR | Status: AC | PRN
  Administered 2020-03-20: 100 mL via INTRAVENOUS

## 2020-03-20 MED ORDER — OXYCODONE-ACETAMINOPHEN 5-325 MG PO TABS
1.0000 | ORAL_TABLET | Freq: Once | ORAL | Status: AC
  Administered 2020-03-20: 1 via ORAL
  Filled 2020-03-20: qty 1

## 2020-03-20 NOTE — ED Triage Notes (Signed)
Patient c/o mid to left lower abd pain that started last night. Per patient nausea, vomiting, and fever. Denies any diarrhea or urinary symptoms. Per patient hx of pancreatitis in which this feels similar. Patient reports taking oxycotin with some relief-last taken at 9:30pm

## 2020-03-20 NOTE — ED Triage Notes (Signed)
Pt was here and d/c today for pancreatitis. Still feeling bad. +n/v. Abd pain.

## 2020-03-20 NOTE — ED Provider Notes (Signed)
[facility] EMERGENCY DEPARTMENT Provider Note   CSN: [id number] Arrival date & time: 03/20/20  7106     History Chief Complaint  Patient presents with  . Abdominal Pain    Justin [name] is a 65 y.o. male.  Pt presents to the ED today with abdominal pain.  Pt has a hx of necrotizing pancreatitis requiring a necrosectomy in 2014.  He has also had previous pseudocysts and has had a cystogastrostomy.  Pt has chronic pancreatitis and takes creon to help control the frequency of episodes.  Pain started last night.  Pt denies any f/c or n/v.  Pt took an oxycontin last night which helped a little bit.  Pt said this feels like his previous pancreatitis flare.           Past Medical History:  Diagnosis Date  . Alcoholism ([facility])   . Asthma   . Back pain   . Fibromyalgia   . GERD (gastroesophageal reflux disease)   . High cholesterol   . Hypertension   . Hypothyroidism   . Myocardial infarct [facility])    cardiologist in town  . Pancreatitis    with pseudocysts  . Pneumonia   . Stroke [facility])    right leg weakness    Patient Active Problem List   Diagnosis Date Noted  . Dog bite of multiple sites 11/10/2017  . Leukocytosis 11/10/2017  . Open nondisplaced fracture of distal phalanx of right ring finger 06/06/17 06/19/2017  . Other pancytopenia ([facility]) 02/06/2013  . Pseudocyst, pancreas 02/03/2013  . Acute pancreatitis 06/28/2012  . Pancreatic abscess 06/28/2012  . Chest pain epsode 4/17 - relieved with NTG 06/28/2012  . SIRS (systemic inflammatory response syndrome) ([facility]) 06/27/2012  . Acute respiratory failure ([facility]) 06/26/2012  . Unspecified protein-calorie malnutrition ([facility]) 06/26/2012  . 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 Elevated triglycerides with high cholesterol 06/10/2012  . Hypothyroidism 06/07/2012  . Depressive disorder, not elsewhere  classified 06/07/2012  . CAD (coronary artery disease) of artery bypass graft 05/30/2012  . Hypokalemia 05/30/2012  . Abnormal LFTs (liver function tests) 05/30/2012  . Back pain   . Stroke ([facility])   . Fibromyalgia   . Myocardial infarct [facility])     Past Surgical History:  Procedure Laterality Date  . BACK SURGERY    . CHOLECYSTECTOMY  2009  . FINGER SURGERY    . I & D EXTREMITY Bilateral 11/10/2017   Procedure: IRRIGATION AND DEBRIDEMENT, BILATERAL UPPER HANDS AND RIGHT FOOT. REVISION AMPUTATION RIGHT RING FINGER.;  Surgeon: Dominica Severin, MD;  Location[facility];  Service: Orthopedics;  Laterality: Bilateral;  . I & D EXTREMITY Bilateral 11/12/2017   Procedure: IRRIGATION AND DEBRIDEMENT RIGHT AND LEFT HANDS;  Surgeon: Dominica Severin, MD;  Location: [facility] OR;  Service: Orthopedics;  Laterality: Bilateral;  . INCISION AND DRAINAGE HIP Right 11/12/2017   Procedure: IRRIGATION AND DEBRIDEMENT RIGHT FOOT;  Surgeon: Dominica Severin, MD;  Location: [facility] OR;  Service: Orthopedics;  Laterality: Right;  . PANCREATIC PSEUDOCYST DRAINAGE  2014       Family History  Problem Relation Age of Onset  . Diabetes Mother   . Heart attack Mother   . Diabetes Brother   . Heart attack Brother   . Brain cancer Brother   . Diabetes Brother   . Cancer Father        ? stomach  . Heart attack Father   .  Pancreatitis Neg Hx     Social History   Tobacco Use  . Smoking status: Former Smoker    Packs/day: 0.12    Years: 5.00    Pack years: 0.60    Types: Cigarettes  . Smokeless tobacco: Never Used  . Tobacco comment: 11/12/2017 "quit smoking in the 1980s/1990s"  Vaping Use  . Vaping Use: Never used  Substance Use Topics  . Alcohol use: No    Comment:  previously used alcohol and quit  ~ 2008  . Drug use: Yes    Types: Marijuana    Comment: daily    Home Medications Prior to Admission medications   Medication Sig Start Date End Date Taking? Authorizing Provider  oxyCODONE-acetaminophen  (PERCOCET/ROXICET) 5-325 MG tablet Take 1 tablet by mouth every 6 (six) hours as needed for severe pain. 03/20/20  Yes Jacalyn Lefevre, MD  albuterol (PROAIR HFA) 108 (90 Base) MCG/ACT inhaler Inhale 1-2 puffs into the lungs every 6 (six) hours as needed for wheezing or shortness of breath.    [provider]  aspirin 81 MG chewable tablet Chew 81 mg by mouth daily.     [provider]  baclofen (LIORESAL) 10 MG tablet Take 10 mg by mouth 3 (three) times daily as needed for muscle spasms.  11/08/17   [provider]  Cholecalciferol (VITAMIN D3) 1000 units CAPS Take 1,000 Units by mouth.     [provider]  clonazePAM (KLONOPIN) 0.5 MG tablet Take 0.5 mg by mouth at bedtime. *May take one tablet three times daily as needed for anxiety* 12/14/14   [provider]  DULoxetine (CYMBALTA) 60 MG capsule Take 60 mg by mouth every morning.     [provider]  Fenofibric Acid 105 MG TABS Take 105 mg by mouth at bedtime.  12/28/14   [provider]  Flax Oil-Fish Oil-Borage Oil CAPS Take 1 capsule by mouth daily.    [provider]  gabapentin (NEURONTIN) 600 MG tablet Take 600 mg by mouth 3 (three) times daily.    [provider]  ibuprofen (ADVIL,MOTRIN) 800 MG tablet Take 800 mg by mouth daily as needed for moderate pain.  07/18/17   [provider]  levothyroxine (SYNTHROID, LEVOTHROID) 112 MCG tablet Take 112 mcg by mouth every morning.     [provider]  Multiple Vitamins-Minerals (MULTIVITAMIN ADULT PO) Take 1 tablet by mouth daily.    [provider]  ondansetron (ZOFRAN ODT) 8 MG disintegrating tablet Take 1 tablet (8 mg total) by mouth every 8 (eight) hours as needed for nausea or vomiting. 11/16/17   Albertine Grates, MD  Pancrelipase, Lip-Prot-Amyl, (CREON) 6000 units CPEP Take 2-4 capsules by mouth 5 (five) times daily. Patient takes 4 capsules by mouth three times a day with meals and 2 capsules by  mouth twice a day with snacks    [provider]  pantoprazole (PROTONIX) 40 MG tablet Take 1 tablet (40 mg total) by mouth daily. 04/20/17   Vassie Loll, MD  polyethylene glycol [facility]) packet Take 17 g by mouth daily as needed for mild constipation.  07/19/12   [provider]  tamsulosin (FLOMAX) 0.4 MG CAPS capsule Take 0.4 mg by mouth 2 (two) times daily.  12/28/14   [provider]    Allergies    Amoxicillin, Dilaudid [hydromorphone hcl], and Hydromorphone  Review of Systems   Review of Systems  Gastrointestinal: Positive for abdominal pain.  All other systems reviewed and are  negative.   Physical Exam Updated Vital Signs BP 137/77   Pulse (!) 58   Temp 98.4 F (36.9 C) (Oral)   Resp 18   Ht 5\' 7"  (1.702 m)   Wt 70.3 kg   SpO2 97%   BMI 24.28 kg/m   Physical Exam Vitals and nursing note reviewed.  Constitutional:      Appearance: He is well-developed.  HENT:     Head: Normocephalic and atraumatic.     Mouth/Throat:     Mouth: Mucous membranes are moist.     Pharynx: Oropharynx is clear.  Eyes:     Extraocular Movements: Extraocular movements intact.     Pupils: Pupils are equal, round, and reactive to light.  Cardiovascular:     Rate and Rhythm: Normal rate and regular rhythm.     Heart sounds: Normal heart sounds.  Pulmonary:     Effort: Pulmonary effort is normal.     Breath sounds: Normal breath sounds.  Abdominal:     General: Abdomen is flat. Bowel sounds are normal.     Palpations: Abdomen is soft.     Tenderness: There is abdominal tenderness in the epigastric area and left upper quadrant.  Skin:    General: Skin is warm.     Capillary Refill: Capillary refill takes less than 2 seconds.  Neurological:     General: No focal deficit present.     Mental Status: He is alert and oriented to person, place, and time.  Psychiatric:        Mood and Affect: Mood normal.        Behavior: Behavior normal.     ED  Results / Procedures / Treatments   Labs (all labs ordered are listed, but only abnormal results are displayed) Labs Reviewed  COMPREHENSIVE METABOLIC PANEL - Abnormal; Notable for the following components:      Result Value   Glucose, Bld 123 (*)    All other components within normal limits  URINALYSIS, ROUTINE W REFLEX MICROSCOPIC - Abnormal; Notable for the following components:   APPearance HAZY (*)    Ketones, ur 80 (*)    Protein, ur 30 (*)    Bacteria, UA RARE (*)    All other components within normal limits  LIPASE, BLOOD  CBC    EKG None  Radiology CT ABDOMEN PELVIS W CONTRAST  Result Date: 03/20/2020 CLINICAL DATA:  Abdominal pain starting last night. Nausea, vomiting and fever. EXAM: CT ABDOMEN AND PELVIS WITH CONTRAST TECHNIQUE: Multidetector CT imaging of the abdomen and pelvis was performed using the standard protocol following bolus administration of intravenous contrast. CONTRAST:  OMNIPAQUE IOHEXOL 300 MG/ML  SOLN COMPARISON:  CT abdomen dated 11/27/2018. FINDINGS: Lower chest: No acute abnormality. Hepatobiliary: No focal liver abnormality is identified. Gallbladder is absent. Stable chronic intrahepatic bile duct dilatation. Common bile duct also appears stable in size and configuration. Pancreas: Stable appearance of the pancreas, with atrophy of the pancreatic body and tail with associated pancreatic duct dilatation. Configuration of the duodenum and pancreatic head again suggest annular pancreas, stable. No peripancreatic fluid. No pseudocyst formation. Several pancreatic calcifications again noted. Spleen: Normal in size without focal abnormality. Adrenals/Urinary Tract: Adrenal glands are unremarkable. Several small renal stones bilaterally. Kidneys otherwise unremarkable without suspicious mass or hydronephrosis. No ureteral or bladder calculi. Bladder is decompressed. Stomach/Bowel: No dilated large or small bowel loops. Fairly extensive diverticulosis  throughout the colon, most prominent within the sigmoid and descending colon but no focal inflammatory change to  suggest acute diverticulitis. Anterior abdominal wall hernia which contains a focal segment of the small bowel but no obstruction or inflammation at the hernia site. Thickening of the walls of the stomach pylorus and/or duodenal bulb, stable compared to the previous exam. Vascular/Lymphatic: Upper abdominal and paraesophageal varices. No acute appearing vascular abnormality. No enlarged lymph nodes are seen in the abdomen or pelvis. Reproductive: Prostate is unremarkable. Other: No free fluid or abscess collection. No free intraperitoneal air. Musculoskeletal: No acute or suspicious osseous finding. Degenerative spondylosis of the lower lumbar spine, mild to moderate in degree. IMPRESSION: 1. Thickening of the walls of the distal stomach and duodenal bulb, similar to previous CT abdomen of 11/27/2018, suggesting recurrent or chronic gastritis/duodenitis. Consider endoscopic evaluation to ensure benignity. 2. No bowel obstruction. No evidence of acute pancreatitis. No free fluid or abscess collection. No hydronephrosis or ureteral calculi. 3. Anterior abdominal wall hernia which contains a focal segment of the small bowel but no obstruction or inflammation at the hernia site. 4. Colonic diverticulosis without evidence of acute diverticulitis. 5. Chronic intrahepatic bile duct dilatation, as previously described. Stable appearance of the atrophic pancreatic body and tail with associated pancreatic duct dilatation. Configuration of the duodenum and pancreatic head again suggests associated annular pancreas, stable. 6. Upper abdominal and paraesophageal varices. 7. Bilateral nephrolithiasis. Electronically Signed   By: Bary Richard M.D.   On: 03/20/2020 09:50    Procedures Procedures (including critical care time)  Medications Ordered in ED Medications  oxyCODONE-acetaminophen (PERCOCET/ROXICET)  5-325 MG per tablet 1 tablet (has no administration in time range)  sodium chloride 0.9 % bolus 1,000 mL (0 mLs Intravenous Stopped 03/20/20 1008)  ondansetron (ZOFRAN) injection 4 mg (4 mg Intravenous Given 03/20/20 0743)  fentaNYL (SUBLIMAZE) injection 100 mcg (100 mcg Intravenous Given 03/20/20 0743)  iohexol (OMNIPAQUE) 300 MG/ML solution 100 mL (100 mLs Intravenous Contrast Given 03/20/20 0916)  fentaNYL (SUBLIMAZE) injection 100 mcg (100 mcg Intravenous Given 03/20/20 1009)  famotidine (PEPCID) IVPB 20 mg premix (20 mg Intravenous New Bag/Given 03/20/20 1034)    ED Course  I have reviewed the triage vital signs and the nursing notes.  Pertinent labs & imaging results that were available during my care of the patient were reviewed by me and considered in my medical decision making (see chart for details).    MDM Rules/Calculators/A&P                          Ct abd/pelvis shows nothing acute.  He may have early pancreatitis.  Pt's pain has improved.  He is stable for d/c.  He knows to return if worse.  Final Clinical Impression(s) / ED Diagnoses Final diagnoses:  Epigastric pain    Rx / DC Orders ED Discharge Orders         Ordered    oxyCODONE-acetaminophen (PERCOCET/ROXICET) 5-325 MG tablet  Every 6 hours PRN        03/20/20 1107           Jacalyn Lefevre, MD 03/20/20 1109

## 2020-03-21 ENCOUNTER — Inpatient Hospital Stay (HOSPITAL_COMMUNITY)
Admission: EM | Admit: 2020-03-21 | Discharge: 2020-03-25 | DRG: 439 | Disposition: A | Discharge status: Home / Self Care | Payer: Medicare Other | Attending: Internal Medicine | Admitting: Internal Medicine

## 2020-03-21 ENCOUNTER — Emergency Department (HOSPITAL_COMMUNITY)
Admission: EM | Admit: 2020-03-21 | Discharge: 2020-03-21 | Disposition: A | Discharge status: Home / Self Care | Payer: Medicare Other | Source: Home / Self Care

## 2020-03-21 ENCOUNTER — Encounter (HOSPITAL_COMMUNITY): Payer: Self-pay | Admitting: Emergency Medicine

## 2020-03-21 ENCOUNTER — Other Ambulatory Visit: Payer: Self-pay

## 2020-03-21 DIAGNOSIS — Z79890 Hormone replacement therapy (postmenopausal): Secondary | ICD-10-CM

## 2020-03-21 DIAGNOSIS — K439 Ventral hernia without obstruction or gangrene: Secondary | ICD-10-CM | POA: Diagnosis present

## 2020-03-21 DIAGNOSIS — Z8673 Personal history of transient ischemic attack (TIA), and cerebral infarction without residual deficits: Secondary | ICD-10-CM

## 2020-03-21 DIAGNOSIS — R112 Nausea with vomiting, unspecified: Secondary | ICD-10-CM | POA: Diagnosis not present

## 2020-03-21 DIAGNOSIS — Z8249 Family history of ischemic heart disease and other diseases of the circulatory system: Secondary | ICD-10-CM

## 2020-03-21 DIAGNOSIS — R1013 Epigastric pain: Secondary | ICD-10-CM

## 2020-03-21 DIAGNOSIS — M797 Fibromyalgia: Secondary | ICD-10-CM | POA: Diagnosis present

## 2020-03-21 DIAGNOSIS — Z87891 Personal history of nicotine dependence: Secondary | ICD-10-CM

## 2020-03-21 DIAGNOSIS — R1110 Vomiting, unspecified: Secondary | ICD-10-CM

## 2020-03-21 DIAGNOSIS — F419 Anxiety disorder, unspecified: Secondary | ICD-10-CM | POA: Diagnosis present

## 2020-03-21 DIAGNOSIS — F32A Depression, unspecified: Secondary | ICD-10-CM | POA: Diagnosis present

## 2020-03-21 DIAGNOSIS — K8681 Exocrine pancreatic insufficiency: Secondary | ICD-10-CM | POA: Diagnosis present

## 2020-03-21 DIAGNOSIS — R197 Diarrhea, unspecified: Secondary | ICD-10-CM | POA: Diagnosis present

## 2020-03-21 DIAGNOSIS — E039 Hypothyroidism, unspecified: Secondary | ICD-10-CM | POA: Diagnosis not present

## 2020-03-21 DIAGNOSIS — Z20822 Contact with and (suspected) exposure to covid-19: Secondary | ICD-10-CM | POA: Diagnosis present

## 2020-03-21 DIAGNOSIS — I8500 Esophageal varices without bleeding: Secondary | ICD-10-CM | POA: Diagnosis present

## 2020-03-21 DIAGNOSIS — Z881 Allergy status to other antibiotic agents status: Secondary | ICD-10-CM

## 2020-03-21 DIAGNOSIS — E860 Dehydration: Secondary | ICD-10-CM | POA: Diagnosis not present

## 2020-03-21 DIAGNOSIS — Q451 Annular pancreas: Secondary | ICD-10-CM

## 2020-03-21 DIAGNOSIS — J45909 Unspecified asthma, uncomplicated: Secondary | ICD-10-CM | POA: Diagnosis present

## 2020-03-21 DIAGNOSIS — Z9049 Acquired absence of other specified parts of digestive tract: Secondary | ICD-10-CM

## 2020-03-21 DIAGNOSIS — I252 Old myocardial infarction: Secondary | ICD-10-CM

## 2020-03-21 DIAGNOSIS — Z7982 Long term (current) use of aspirin: Secondary | ICD-10-CM

## 2020-03-21 DIAGNOSIS — Z79899 Other long term (current) drug therapy: Secondary | ICD-10-CM

## 2020-03-21 DIAGNOSIS — I10 Essential (primary) hypertension: Secondary | ICD-10-CM | POA: Diagnosis present

## 2020-03-21 DIAGNOSIS — K219 Gastro-esophageal reflux disease without esophagitis: Secondary | ICD-10-CM | POA: Diagnosis present

## 2020-03-21 DIAGNOSIS — K859 Acute pancreatitis without necrosis or infection, unspecified: Secondary | ICD-10-CM | POA: Diagnosis present

## 2020-03-21 DIAGNOSIS — K861 Other chronic pancreatitis: Secondary | ICD-10-CM | POA: Diagnosis present

## 2020-03-21 DIAGNOSIS — N400 Enlarged prostate without lower urinary tract symptoms: Secondary | ICD-10-CM | POA: Diagnosis present

## 2020-03-21 DIAGNOSIS — R109 Unspecified abdominal pain: Secondary | ICD-10-CM | POA: Diagnosis not present

## 2020-03-21 DIAGNOSIS — I2510 Atherosclerotic heart disease of native coronary artery without angina pectoris: Secondary | ICD-10-CM | POA: Diagnosis present

## 2020-03-21 DIAGNOSIS — E7800 Pure hypercholesterolemia, unspecified: Secondary | ICD-10-CM | POA: Diagnosis present

## 2020-03-21 DIAGNOSIS — Z885 Allergy status to narcotic agent status: Secondary | ICD-10-CM

## 2020-03-21 DIAGNOSIS — K2970 Gastritis, unspecified, without bleeding: Secondary | ICD-10-CM | POA: Diagnosis present

## 2020-03-21 LAB — COMPREHENSIVE METABOLIC PANEL
ALT: 54 U/L — ABNORMAL HIGH (ref 0–44)
AST: 29 U/L (ref 15–41)
Albumin: 4.8 g/dL (ref 3.5–5.0)
Alkaline Phosphatase: 113 U/L (ref 38–126)
Anion gap: 12 (ref 5–15)
BUN: 16 mg/dL (ref 8–23)
CO2: 25 mmol/L (ref 22–32)
Calcium: 10 mg/dL (ref 8.9–10.3)
Chloride: 106 mmol/L (ref 98–111)
Creatinine, Ser: 0.92 mg/dL (ref 0.61–1.24)
GFR, Estimated: >60 mL/min (ref >60)
Glucose, Bld: 121 mg/dL — ABNORMAL HIGH (ref 70–99)
Potassium: 4.6 mmol/L (ref 3.5–5.1)
Sodium: 143 mmol/L (ref 135–145)
Total Bilirubin: 1.2 mg/dL (ref 0.3–1.2)
Total Protein: 8.5 g/dL — ABNORMAL HIGH (ref 6.5–8.1)

## 2020-03-21 LAB — CBC WITH DIFFERENTIAL/PLATELET
Abs Immature Granulocytes: 0.08 10*3/uL — ABNORMAL HIGH (ref 0.00–0.07)
Basophils Absolute: 0 10*3/uL (ref 0.0–0.1)
Basophils Relative: 0 %
Eosinophils Absolute: 0 10*3/uL (ref 0.0–0.5)
Eosinophils Relative: 0 %
HCT: 49.8 % (ref 39.0–52.0)
Hemoglobin: 16.8 g/dL (ref 13.0–17.0)
Immature Granulocytes: 1 %
Lymphocytes Relative: 5 %
Lymphs Abs: 0.7 10*3/uL (ref 0.7–4.0)
MCH: 29.5 pg (ref 26.0–34.0)
MCHC: 33.7 g/dL (ref 30.0–36.0)
MCV: 87.5 fL (ref 80.0–100.0)
Monocytes Absolute: 0.6 10*3/uL (ref 0.1–1.0)
Monocytes Relative: 4 %
Neutro Abs: 12.7 10*3/uL — ABNORMAL HIGH (ref 1.7–7.7)
Neutrophils Relative %: 90 %
Platelets: 218 10*3/uL (ref 150–400)
RBC: 5.69 MIL/uL (ref 4.22–5.81)
RDW: 14.1 % (ref 11.5–15.5)
WBC: 14.1 10*3/uL — ABNORMAL HIGH (ref 4.0–10.5)
nRBC: 0 % (ref 0.0–0.2)

## 2020-03-21 LAB — RESP PANEL BY RT-PCR (FLU A&B, COVID) ARPGX2
Influenza A by PCR: NEGATIVE
Influenza B by PCR: NEGATIVE
SARS Coronavirus 2 by RT PCR: NEGATIVE

## 2020-03-21 LAB — URINALYSIS, ROUTINE W REFLEX MICROSCOPIC
Bilirubin Urine: NEGATIVE
Glucose, UA: NEGATIVE mg/dL
Hgb urine dipstick: NEGATIVE
Ketones, ur: 80 mg/dL — AB
Leukocytes,Ua: NEGATIVE
Nitrite: NEGATIVE
Protein, ur: NEGATIVE mg/dL
Specific Gravity, Urine: 1.019 (ref 1.005–1.030)
pH: 5 (ref 5.0–8.0)

## 2020-03-21 LAB — LIPASE, BLOOD: Lipase: 56 U/L — ABNORMAL HIGH (ref 11–51)

## 2020-03-21 MED ORDER — LACTATED RINGERS IV SOLN: INTRAVENOUS | Status: DC

## 2020-03-21 MED ORDER — CLONAZEPAM 0.5 MG PO TABS
0.5000 mg | ORAL_TABLET | Freq: Every day | ORAL | Status: DC
  Administered 2020-03-21 – 2020-03-24 (×4): 0.5 mg via ORAL
  Filled 2020-03-21 (×4): qty 1

## 2020-03-21 MED ORDER — LEVOTHYROXINE SODIUM 112 MCG PO TABS
112.0000 ug | ORAL_TABLET | Freq: Every morning | ORAL | Status: DC
  Administered 2020-03-22 – 2020-03-25 (×4): 112 ug via ORAL
  Filled 2020-03-21 (×4): qty 1

## 2020-03-21 MED ORDER — DULOXETINE HCL 60 MG PO CPEP
60.0000 mg | ORAL_CAPSULE | Freq: Every morning | ORAL | Status: DC
  Administered 2020-03-21 – 2020-03-25 (×5): 60 mg via ORAL
  Filled 2020-03-21 (×5): qty 1

## 2020-03-21 MED ORDER — ACETAMINOPHEN 650 MG RE SUPP: 650.0000 mg | Freq: Four times a day (QID) | RECTAL | Status: DC | PRN

## 2020-03-21 MED ORDER — ASPIRIN 81 MG PO CHEW
81.0000 mg | CHEWABLE_TABLET | Freq: Every day | ORAL | Status: DC
Start: 2020-03-21 — End: 2020-03-25
  Administered 2020-03-21 – 2020-03-25 (×5): 81 mg via ORAL
  Filled 2020-03-21 (×5): qty 1

## 2020-03-21 MED ORDER — METRONIDAZOLE 500 MG PO TABS
500.0000 mg | ORAL_TABLET | Freq: Three times a day (TID) | ORAL | Status: DC
  Administered 2020-03-21 – 2020-03-23 (×3): 500 mg via ORAL
  Filled 2020-03-21 (×3): qty 1

## 2020-03-21 MED ORDER — PROMETHAZINE HCL 25 MG/ML IJ SOLN
12.5000 mg | Freq: Four times a day (QID) | INTRAMUSCULAR | Status: DC | PRN
  Administered 2020-03-21 – 2020-03-22 (×2): 12.5 mg via INTRAVENOUS
  Filled 2020-03-21 (×2): qty 1

## 2020-03-21 MED ORDER — TAMSULOSIN HCL 0.4 MG PO CAPS
0.4000 mg | ORAL_CAPSULE | Freq: Two times a day (BID) | ORAL | Status: DC
  Administered 2020-03-21 – 2020-03-25 (×9): 0.4 mg via ORAL
  Filled 2020-03-21 (×9): qty 1

## 2020-03-21 MED ORDER — PANCRELIPASE (LIP-PROT-AMYL) 36000-114000 UNITS PO CPEP
36000.0000 [IU] | ORAL_CAPSULE | Freq: Three times a day (TID) | ORAL | Status: DC
  Administered 2020-03-21 – 2020-03-25 (×10): 36000 [IU] via ORAL
  Filled 2020-03-21 (×11): qty 1

## 2020-03-21 MED ORDER — SODIUM CHLORIDE 0.9 % IV BOLUS
1000.0000 mL | Freq: Once | INTRAVENOUS | Status: AC
  Administered 2020-03-21: 1000 mL via INTRAVENOUS

## 2020-03-21 MED ORDER — CIPROFLOXACIN HCL 250 MG PO TABS
500.0000 mg | ORAL_TABLET | Freq: Two times a day (BID) | ORAL | Status: DC
  Administered 2020-03-21 – 2020-03-23 (×4): 500 mg via ORAL
  Filled 2020-03-21 (×4): qty 2

## 2020-03-21 MED ORDER — GABAPENTIN 300 MG PO CAPS
600.0000 mg | ORAL_CAPSULE | Freq: Three times a day (TID) | ORAL | Status: DC
  Administered 2020-03-21 – 2020-03-25 (×12): 600 mg via ORAL
  Filled 2020-03-21 (×13): qty 2

## 2020-03-21 MED ORDER — PROMETHAZINE HCL 25 MG/ML IJ SOLN
25.0000 mg | Freq: Once | INTRAMUSCULAR | Status: AC
  Administered 2020-03-21: 25 mg via INTRAVENOUS
  Filled 2020-03-21: qty 1

## 2020-03-21 MED ORDER — PANTOPRAZOLE SODIUM 40 MG IV SOLR
40.0000 mg | Freq: Two times a day (BID) | INTRAVENOUS | Status: DC
  Administered 2020-03-21 – 2020-03-22 (×3): 40 mg via INTRAVENOUS
  Filled 2020-03-21 (×3): qty 40

## 2020-03-21 MED ORDER — ACETAMINOPHEN 325 MG PO TABS: 650.0000 mg | ORAL_TABLET | Freq: Four times a day (QID) | ORAL | Status: DC | PRN

## 2020-03-21 MED ORDER — MORPHINE SULFATE (PF) 2 MG/ML IV SOLN
2.0000 mg | INTRAVENOUS | Status: DC | PRN
  Administered 2020-03-21 – 2020-03-25 (×25): 2 mg via INTRAVENOUS
  Filled 2020-03-21 (×25): qty 1

## 2020-03-21 MED ORDER — ONDANSETRON HCL 4 MG/2ML IJ SOLN
4.0000 mg | Freq: Once | INTRAMUSCULAR | Status: AC
  Administered 2020-03-21: 4 mg via INTRAVENOUS
  Filled 2020-03-21: qty 2

## 2020-03-21 MED ORDER — MORPHINE SULFATE (PF) 4 MG/ML IV SOLN
4.0000 mg | Freq: Once | INTRAVENOUS | Status: AC
  Administered 2020-03-21: 4 mg via INTRAVENOUS
  Filled 2020-03-21: qty 1

## 2020-03-21 NOTE — H&P (Signed)
History and Physical    Justin [name] DOB: 1955-08-09 DOA: 03/21/2020  PCP: [name], MD  Patient coming from: Home  I have personally briefly reviewed patient's old medical records in [facility]  Chief Complaint: Abdominal pain  HPI: Justin [name] is a 65 y.o. male with medical history significant of chronic pancreatitis, GERD, hypertension, presents to the hospital with complaints of abdominal pain, nausea, vomiting, diarrhea.  He reports that he has had the symptoms for the past 3 days.  They have progressively gotten worse.  He came to the ER yesterday for evaluation.  At that time he had a CT abdomen done that did show some gastritis versus duodenitis.  Labs were relatively unrevealing.  He was treated symptomatically and discharged home.  He returned to the hospital today due to recurrence/worsening of symptoms.  He reports that he has been unable to keep anything down.  Vomitus is dark green in color.  He is thrown up multiple times.  He is also having several loose stools which are dark green in color as well.  He reports being febrile over the past 3 days.  Symptoms did start after he had chicken wings.  No other sick contacts.  He feels that his discomfort is typical of his pancreatitis pain.  ED Course: Upon arrival to the emergency room, he was noted to be mildly dehydrated, tachycardic.  He was started on IV fluids with resolution of tachycardia.  Labs are relatively unrevealing, lipase is only minimally elevated.  He continued to vomit in the emergency room.  He has been referred for admission.  Review of Systems: Review of Systems  Constitutional: Positive for chills, diaphoresis and fever.  HENT: Negative for congestion and sore throat.   Eyes: Negative for blurred vision and double vision.  Respiratory: Positive for wheezing. Negative for cough and shortness of breath.   Cardiovascular: Negative for chest pain, palpitations and leg swelling.   Gastrointestinal: Positive for abdominal pain, diarrhea, nausea and vomiting. Negative for blood in stool and melena.  Genitourinary: Negative for dysuria.  Musculoskeletal: Negative for myalgias.  Neurological: Positive for weakness. Negative for focal weakness and loss of consciousness.      Past Medical History:  Diagnosis Date  . Alcoholism (HCC)   . Asthma   . Back pain   . Fibromyalgia   . GERD (gastroesophageal reflux disease)   . High cholesterol   . Hypertension   . Hypothyroidism   . Myocardial infarct [facility])    cardiologist in town  . Pancreatitis    with pseudocysts  . Pneumonia   . Stroke [facility])    right leg weakness    Past Surgical History:  Procedure Laterality Date  . BACK SURGERY    . CHOLECYSTECTOMY  2009  . FINGER SURGERY    . I & D EXTREMITY Bilateral 11/10/2017   Procedure: IRRIGATION AND DEBRIDEMENT, BILATERAL UPPER HANDS AND RIGHT FOOT. REVISION AMPUTATION RIGHT RING FINGER.;  Surgeon: Dominica Severin, MD;  Location: [facility] OR;  Service: Orthopedics;  Laterality: Bilateral;  . I & D EXTREMITY Bilateral 11/12/2017   Procedure: IRRIGATION AND DEBRIDEMENT RIGHT AND LEFT HANDS;  Surgeon: Dominica Severin, MD;  Location: [facility] OR;  Service: Orthopedics;  Laterality: Bilateral;  . INCISION AND DRAINAGE HIP Right 11/12/2017   Procedure: IRRIGATION AND DEBRIDEMENT RIGHT FOOT;  Surgeon: Dominica Severin, MD;  Location: [facility] OR;  Service: Orthopedics;  Laterality: Right;  . PANCREATIC PSEUDOCYST DRAINAGE  2014    Social History:  reports that he has quit smoking. His smoking use included cigarettes. He has a 0.60 pack-year smoking history. He has never used smokeless tobacco. He reports current drug use. Drug: Marijuana. He reports that he does not drink alcohol.  Allergies  Allergen Reactions  . Amoxicillin Hives and Swelling  . Dilaudid [Hydromorphone Hcl] Swelling  . Hydromorphone Swelling    Family History  Problem Relation Age of Onset  . Diabetes Mother   .  Heart attack Mother   . Diabetes Brother   . Heart attack Brother   . Brain cancer Brother   . Diabetes Brother   . Cancer Father        ? stomach  . Heart attack Father   . Pancreatitis Neg Hx     Prior to Admission medications   Medication Sig Start Date End Date Taking? Authorizing Provider  albuterol (VENTOLIN HFA) 108 (90 Base) MCG/ACT inhaler Inhale 1-2 puffs into the lungs every 6 (six) hours as needed for wheezing or shortness of breath.   Yes [provider]  aspirin 81 MG chewable tablet Chew 81 mg by mouth daily.    Yes [provider]  B Complex Vitamins (VITAMIN B COMPLEX PO) Take by mouth.   Yes [provider]  baclofen (LIORESAL) 10 MG tablet Take 10 mg by mouth 3 (three) times daily as needed for muscle spasms.  11/08/17  Yes [provider]  Cholecalciferol (VITAMIN D3) 1000 units CAPS Take 1,000 Units by mouth.    Yes [provider]  clonazePAM (KLONOPIN) 0.5 MG tablet Take 0.5 mg by mouth at bedtime. *May take one tablet three times daily as needed for anxiety* 12/14/14  Yes [provider]  DULoxetine (CYMBALTA) 60 MG capsule Take 60 mg by mouth every morning.   Yes [provider]  Fenofibric Acid 105 MG TABS Take 105 mg by mouth at bedtime.  12/28/14  Yes [provider]  Flax Oil-Fish Oil-Borage Oil CAPS Take 1 capsule by mouth daily.   Yes [provider]  folic acid (FOLVITE) 1 MG tablet Take 1 mg by mouth 2 (two) times daily. 01/21/20  Yes [provider]  gabapentin (NEURONTIN) 600 MG tablet Take 600 mg by mouth 3 (three) times daily.   Yes [provider]  ibuprofen (ADVIL,MOTRIN) 800 MG tablet Take 800 mg by mouth daily as needed for moderate pain.  07/18/17  Yes [provider]  levothyroxine (SYNTHROID, LEVOTHROID) 112 MCG tablet Take 112 mcg by mouth every morning.    Yes [provider]  methotrexate 2.5 MG tablet Take 15 mg by mouth once a  week. 03/20/20  Yes [provider]  Multiple Vitamins-Minerals (MULTIVITAMIN ADULT PO) Take 1 tablet by mouth daily.   Yes [provider]  ondansetron (ZOFRAN ODT) 8 MG disintegrating tablet Take 1 tablet (8 mg total) by mouth every 8 (eight) hours as needed for nausea or vomiting. 11/16/17  Yes Albertine Grates, MD  oxyCODONE-acetaminophen (PERCOCET/ROXICET) 5-325 MG tablet Take 1 tablet by mouth every 6 (six) hours as needed for severe pain. 03/20/20  Yes Jacalyn Lefevre, MD  Pancrelipase, Lip-Prot-Amyl, 6000-19000 units CPEP Take 2-4 capsules by mouth 5 (five) times daily. Patient takes 4 capsules by mouth three times a day with meals and 2 capsules by mouth twice a day with snacks   Yes [provider]  pantoprazole (PROTONIX) 40 MG tablet Take 1 tablet (40 mg total) by mouth daily. 04/20/17  Yes Vassie Loll, MD  polyethylene  glycol (MIRALAX / GLYCOLAX) packet Take 17 g by mouth daily as needed for mild constipation.  07/19/12  Yes [provider]  tamsulosin (FLOMAX) 0.4 MG CAPS capsule Take 0.4 mg by mouth 2 (two) times daily.  12/28/14  Yes [provider]  tiZANidine (ZANAFLEX) 2 MG tablet Take 2 mg by mouth every 8 (eight) hours as needed. 03/08/20  Yes [provider]  traMADol (ULTRAM) 50 MG tablet Take 50 mg by mouth every 6 (six) hours as needed. 10/16/19  Yes [provider]    Physical Exam: Vitals:   03/21/20 1330 03/21/20 1400 03/21/20 1501 03/21/20 1800  BP: 131/77 (!) 141/80 132/76 137/85  Pulse: 64 90 65 80  Resp: Temp:   98.9 F (37.2 C) 98.2 F (36.8 C)  TempSrc:   Oral   SpO2: 99% 100% 97% 99%  Weight:   72.1 kg   Height:    (1.702 m)     Constitutional: NAD, calm, comfortable Eyes: PERRL, lids and conjunctivae normal ENMT: Mucous membranes are moist. Posterior pharynx clear of any exudate or lesions.Normal dentition.  Neck: normal, supple, no masses, no thyromegaly Respiratory: clear to  auscultation bilaterally, no wheezing, no crackles. Normal respiratory effort. No accessory muscle use.  Cardiovascular: Regular rate and rhythm, no murmurs / rubs / gallops. No extremity edema. 2+ pedal pulses. No carotid bruits.  Abdomen: Diffusely tender, no masses palpated. No hepatosplenomegaly. Bowel sounds positive.  Musculoskeletal: no clubbing / cyanosis. No joint deformity upper and lower extremities. Good ROM, no contractures. Normal muscle tone.  Skin: no rashes, lesions, ulcers. No induration Neurologic: CN 2-12 grossly intact. Sensation intact, DTR normal. Strength 5/5 in all 4.  Psychiatric: Normal judgment and insight. Alert and oriented x 3. Normal mood.    Labs on Admission: I have personally reviewed following labs and imaging studies  CBC: Recent Labs  Lab 03/20/20 0715 03/21/20 0745  WBC 5.4 14.1*  NEUTROABS  --  12.7*  HGB 15.6 16.8  HCT 46.4 49.8  MCV 87.2 87.5  PLT 197 218   Basic Metabolic Panel: Recent Labs  Lab 03/20/20 0715 03/21/20 0745  NA 138 143  K 3.5 4.6  CL 105 106  CO2 23 25  GLUCOSE 123* 121*  BUN 10 16  CREATININE 0.88 0.92  CALCIUM 9.3 10.0   GFR: Estimated Creatinine Clearance: 75.8 mL/min (by C-G formula based on SCr of 0.92 mg/dL). Liver Function Tests: Recent Labs  Lab 03/20/20 0715 03/21/20 0745  AST 24 29  ALT 36 54*  ALKPHOS 97 113  BILITOT 1.0 1.2  PROT 7.9 8.5*  ALBUMIN 4.4 4.8   Recent Labs  Lab 03/20/20 0715 03/21/20 0745  LIPASE 38 56*   No results for input(s): AMMONIA in the last 168 hours. Coagulation Profile: No results for input(s): INR, PROTIME in the last 168 hours. Cardiac Enzymes: No results for input(s): CKTOTAL, CKMB, CKMBINDEX, TROPONINI in the last 168 hours. BNP (last 3 results) No results for input(s): PROBNP in the last 8760 hours. HbA1C: No results for input(s): HGBA1C in the last 72 hours. CBG: No results for input(s): GLUCAP in the last 168 hours. Lipid Profile: No results for  input(s): CHOL, HDL, LDLCALC, TRIG, CHOLHDL, LDLDIRECT in the last 72 hours. Thyroid Function Tests: No results for input(s): TSH, T4TOTAL, FREET4, T3FREE, THYROIDAB in the last 72 hours. Anemia Panel: No results for input(s): VITAMINB12, FOLATE, FERRITIN, TIBC, IRON, RETICCTPCT in the last 72 hours. Urine analysis:  Component Value Date/Time   COLORURINE YELLOW 03/21/2020 0745   APPEARANCEUR CLEAR 03/21/2020 0745   LABSPEC 1.019 03/21/2020 0745   PHURINE 5.0 03/21/2020 0745   GLUCOSEU NEGATIVE 03/21/2020 0745   HGBUR NEGATIVE 03/21/2020 0745   BILIRUBINUR NEGATIVE 03/21/2020 0745   KETONESUR 80 (A) 03/21/2020 0745   PROTEINUR NEGATIVE 03/21/2020 0745   UROBILINOGEN 0.2 06/22/2013 0333   NITRITE NEGATIVE 03/21/2020 0745   LEUKOCYTESUR NEGATIVE 03/21/2020 0745    Radiological Exams on Admission: CT ABDOMEN PELVIS W CONTRAST  Result Date: 03/20/2020 CLINICAL DATA:  Abdominal pain starting last night. Nausea, vomiting and fever. EXAM: CT ABDOMEN AND PELVIS WITH CONTRAST TECHNIQUE: Multidetector CT imaging of the abdomen and pelvis was performed using the standard protocol following bolus administration of intravenous contrast. CONTRAST:  OMNIPAQUE IOHEXOL 300 MG/ML  SOLN COMPARISON:  CT abdomen dated 11/27/2018. FINDINGS: Lower chest: No acute abnormality. Hepatobiliary: No focal liver abnormality is identified. Gallbladder is absent. Stable chronic intrahepatic bile duct dilatation. Common bile duct also appears stable in size and configuration. Pancreas: Stable appearance of the pancreas, with atrophy of the pancreatic body and tail with associated pancreatic duct dilatation. Configuration of the duodenum and pancreatic head again suggest annular pancreas, stable. No peripancreatic fluid. No pseudocyst formation. Several pancreatic calcifications again noted. Spleen: Normal in size without focal abnormality. Adrenals/Urinary Tract: Adrenal glands are unremarkable. Several small renal  stones bilaterally. Kidneys otherwise unremarkable without suspicious mass or hydronephrosis. No ureteral or bladder calculi. Bladder is decompressed. Stomach/Bowel: No dilated large or small bowel loops. Fairly extensive diverticulosis throughout the colon, most prominent within the sigmoid and descending colon but no focal inflammatory change to suggest acute diverticulitis. Anterior abdominal wall hernia which contains a focal segment of the small bowel but no obstruction or inflammation at the hernia site. Thickening of the walls of the stomach pylorus and/or duodenal bulb, stable compared to the previous exam. Vascular/Lymphatic: Upper abdominal and paraesophageal varices. No acute appearing vascular abnormality. No enlarged lymph nodes are seen in the abdomen or pelvis. Reproductive: Prostate is unremarkable. Other: No free fluid or abscess collection. No free intraperitoneal air. Musculoskeletal: No acute or suspicious osseous finding. Degenerative spondylosis of the lower lumbar spine, mild to moderate in degree. IMPRESSION: 1. Thickening of the walls of the distal stomach and duodenal bulb, similar to previous CT abdomen of 11/27/2018, suggesting recurrent or chronic gastritis/duodenitis. Consider endoscopic evaluation to ensure benignity. 2. No bowel obstruction. No evidence of acute pancreatitis. No free fluid or abscess collection. No hydronephrosis or ureteral calculi. 3. Anterior abdominal wall hernia which contains a focal segment of the small bowel but no obstruction or inflammation at the hernia site. 4. Colonic diverticulosis without evidence of acute diverticulitis. 5. Chronic intrahepatic bile duct dilatation, as previously described. Stable appearance of the atrophic pancreatic body and tail with associated pancreatic duct dilatation. Configuration of the duodenum and pancreatic head again suggests associated annular pancreas, stable. 6. Upper abdominal and paraesophageal varices. 7. Bilateral  nephrolithiasis. Electronically Signed   By: Bary Richard M.D.   On: 03/20/2020 09:50     Assessment/Plan Active Problems:   Hypothyroidism   Pancreatitis   Abdominal pain   Nausea vomiting and diarrhea   Dehydration     Abdominal pain -Unclear etiology -CT scan indicates gastritis versus duodenitis.  There is also concern for possible acute on chronic pancreatitis -GI consulted for possible endoscopy -Keep the patient on liquid diet and n.p.o. after midnight -Treat supportively with pain management and bowel rest, antiemetics -Start  on PPI  Vomiting/diarrhea -Suspect this may be a component of pancreatitis -Since the patient is having fevers, will check GI pathogen panel and C. Difficile -Start on ciprofloxacin and Flagyl  Hypothyroidism -Continue Synthroid  Dehydration -Continue on IV fluids  DVT prophylaxis: SCDs Code Status: Full code Family Communication: Discussed with wife over the phone Disposition Plan: Discharge home once symptoms have improved Consults called: Gastroenterology Admission status: Observation, MedSurg  Erick Blinks MD [facility]   If 7PM-7AM, please contact night-coverage [email or web address]   03/21/2020, 8:45 PM

## 2020-03-21 NOTE — ED Provider Notes (Signed)
[facility] EMERGENCY DEPARTMENT Provider Note   CSN: [id number] Arrival date & time: 03/21/20  0425     History Chief Complaint  Patient presents with  . Abdominal Pain    Justin [name] is a 65 y.o. male.  Pt presents to the ED today with continued abd pain and n/v.  Pt has a hx of chronic pancreatitis.  He was seen by me yesterday and had a CT scan which did not show an acute process.  His pain had improved, so he came back yesterday evening.  He left because of the wait.  He said he threw up all night long and came back this am.  He has not been able to keep anything down.  He said his pain is worse.        Past Medical History:  Diagnosis Date  . Alcoholism (HCC)   . Asthma   . Back pain   . Fibromyalgia   . GERD (gastroesophageal reflux disease)   . High cholesterol   . Hypertension   . Hypothyroidism   . Myocardial infarct [facility])    cardiologist in town  . Pancreatitis    with pseudocysts  . Pneumonia   . Stroke [facility])    right leg weakness    Patient Active Problem List   Diagnosis Date Noted  . Pancreatitis 03/21/2020  . Dog bite of multiple sites 11/10/2017  . Leukocytosis 11/10/2017  . Open nondisplaced fracture of distal phalanx of right ring finger 06/06/17 06/19/2017  . Other pancytopenia (HCC) 02/06/2013  . Pseudocyst, pancreas 02/03/2013  . Acute pancreatitis 06/28/2012  . Pancreatic abscess 06/28/2012  . Chest pain epsode 4/17 - relieved with NTG 06/28/2012  . SIRS (systemic inflammatory response syndrome) (HCC) 06/27/2012  . Acute respiratory failure (HCC) 06/26/2012  . Unspecified protein-calorie malnutrition (HCC) 06/26/2012  . 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 Elevated triglycerides with high cholesterol 06/10/2012  . Hypothyroidism 06/07/2012  . Depressive disorder, not elsewhere classified 06/07/2012  . CAD  (coronary artery disease) of artery bypass graft 05/30/2012  . Hypokalemia 05/30/2012  . Abnormal LFTs (liver function tests) 05/30/2012  . Back pain   . Stroke (HCC)   . Fibromyalgia   . Myocardial infarct [facility])     Past Surgical History:  Procedure Laterality Date  . BACK SURGERY    . CHOLECYSTECTOMY  2009  . FINGER SURGERY    . I & D EXTREMITY Bilateral 11/10/2017   Procedure: IRRIGATION AND DEBRIDEMENT, BILATERAL UPPER HANDS AND RIGHT FOOT. REVISION AMPUTATION RIGHT RING FINGER.;  Surgeon: Dominica Severin, MD;  Location: [facility] OR;  Service: Orthopedics;  Laterality: Bilateral;  . I & D EXTREMITY Bilateral 11/12/2017   Procedure: IRRIGATION AND DEBRIDEMENT RIGHT AND LEFT HANDS;  Surgeon: Dominica Severin, MD;  Location: [facility] OR;  Service: Orthopedics;  Laterality: Bilateral;  . INCISION AND DRAINAGE HIP Right 11/12/2017   Procedure: IRRIGATION AND DEBRIDEMENT RIGHT FOOT;  Surgeon: Dominica Severin, MD;  Location: [facility] OR;  Service: Orthopedics;  Laterality: Right;  . PANCREATIC PSEUDOCYST DRAINAGE  2014       Family History  Problem Relation Age of Onset  . Diabetes Mother   . Heart attack Mother   . Diabetes Brother   . Heart attack Brother   . Brain cancer Brother   . Diabetes Brother   . Cancer Father        ? stomach  .  Heart attack Father   . Pancreatitis Neg Hx     Social History   Tobacco Use  . Smoking status: Former Smoker    Packs/day: 0.12    Years: 5.00    Pack years: 0.60    Types: Cigarettes  . Smokeless tobacco: Never Used  . Tobacco comment: 11/12/2017 "quit smoking in the 1980s/1990s"  Vaping Use  . Vaping Use: Never used  Substance Use Topics  . Alcohol use: No    Comment:  previously used alcohol and quit  ~ 2008  . Drug use: Yes    Types: Marijuana    Comment: daily    Home Medications Prior to Admission medications   Medication Sig Start Date End Date Taking? Authorizing Provider  albuterol (PROAIR HFA) 108 (90 Base) MCG/ACT inhaler Inhale 1-2  puffs into the lungs every 6 (six) hours as needed for wheezing or shortness of breath.    [provider]  aspirin 81 MG chewable tablet Chew 81 mg by mouth daily.     [provider]  baclofen (LIORESAL) 10 MG tablet Take 10 mg by mouth 3 (three) times daily as needed for muscle spasms.  11/08/17   [provider]  Cholecalciferol (VITAMIN D3) 1000 units CAPS Take 1,000 Units by mouth.     [provider]  clonazePAM (KLONOPIN) 0.5 MG tablet Take 0.5 mg by mouth at bedtime. *May take one tablet three times daily as needed for anxiety* 12/14/14   [provider]  DULoxetine (CYMBALTA) 60 MG capsule Take 60 mg by mouth every morning.     [provider]  Fenofibric Acid 105 MG TABS Take 105 mg by mouth at bedtime.  12/28/14   [provider]  Flax Oil-Fish Oil-Borage Oil CAPS Take 1 capsule by mouth daily.    [provider]  gabapentin (NEURONTIN) 600 MG tablet Take 600 mg by mouth 3 (three) times daily.    [provider]  ibuprofen (ADVIL,MOTRIN) 800 MG tablet Take 800 mg by mouth daily as needed for moderate pain.  07/18/17   [provider]  levothyroxine (SYNTHROID, LEVOTHROID) 112 MCG tablet Take 112 mcg by mouth every morning.     [provider]  Multiple Vitamins-Minerals (MULTIVITAMIN ADULT PO) Take 1 tablet by mouth daily.    [provider]  ondansetron (ZOFRAN ODT) 8 MG disintegrating tablet Take 1 tablet (8 mg total) by mouth every 8 (eight) hours as needed for nausea or vomiting. 11/16/17   Albertine Grates, MD  oxyCODONE-acetaminophen (PERCOCET/ROXICET) 5-325 MG tablet Take 1 tablet by mouth every 6 (six) hours as needed for severe pain. 03/20/20   Jacalyn Lefevre, MD  Pancrelipase, Lip-Prot-Amyl, (CREON) 6000 units CPEP Take 2-4 capsules by mouth 5 (five) times daily. Patient takes 4 capsules by mouth three times a day with meals and 2 capsules by mouth twice a day with snacks    [provider]  pantoprazole (PROTONIX) 40 MG tablet Take 1 tablet (40 mg total) by mouth daily. 04/20/17   Vassie Loll, MD  polyethylene glycol [facility] Hal) packet Take 17 g by mouth daily as needed for mild constipation.  07/19/12   [provider]  tamsulosin (FLOMAX) 0.4 MG CAPS capsule Take 0.4 mg by mouth 2 (two) times daily.  12/28/14   [provider]    Allergies    Amoxicillin, Dilaudid [hydromorphone hcl], and Hydromorphone  Review of Systems   Review of Systems  Gastrointestinal: Positive for abdominal pain, nausea  and vomiting.  All other systems reviewed and are negative.   Physical Exam Updated Vital Signs BP 125/71   Pulse 83   Temp 99.2 F (37.3 C) (Oral)   Resp (!) 21   Ht 5\' 7"  (1.702 m)   Wt 70.4 kg   SpO2 98%   BMI 24.31 kg/m   Physical Exam Vitals and nursing note reviewed.  Constitutional:      Appearance: He is well-developed.  HENT:     Head: Normocephalic and atraumatic.     Mouth/Throat:     Mouth: Mucous membranes are dry.  Eyes:     Extraocular Movements: Extraocular movements intact.     Pupils: Pupils are equal, round, and reactive to light.  Cardiovascular:     Rate and Rhythm: Regular rhythm. Tachycardia present.     Heart sounds: Normal heart sounds.  Pulmonary:     Effort: Pulmonary effort is normal.     Breath sounds: Normal breath sounds.  Abdominal:     General: Abdomen is flat.     Tenderness: There is abdominal tenderness in the epigastric area and left upper quadrant.  Skin:    General: Skin is warm.     Capillary Refill: Capillary refill takes less than 2 seconds.  Neurological:     General: No focal deficit present.     Mental Status: He is alert and oriented to person, place, and time.  Psychiatric:        Mood and Affect: Mood normal.        Behavior: Behavior normal.     ED Results / Procedures / Treatments   Labs (all labs ordered are listed, but only abnormal results are  displayed) Labs Reviewed  CBC WITH DIFFERENTIAL/PLATELET - Abnormal; Notable for the following components:      Result Value   WBC 14.1 (*)    Neutro Abs 12.7 (*)    Abs Immature Granulocytes 0.08 (*)    All other components within normal limits  COMPREHENSIVE METABOLIC PANEL - Abnormal; Notable for the following components:   Glucose, Bld 121 (*)    Total Protein 8.5 (*)    ALT 54 (*)    All other components within normal limits  LIPASE, BLOOD - Abnormal; Notable for the following components:   Lipase 56 (*)    All other components within normal limits  RESP PANEL BY RT-PCR (FLU A&B, COVID) ARPGX2  URINALYSIS, ROUTINE W REFLEX MICROSCOPIC    EKG None  Radiology CT ABDOMEN PELVIS W CONTRAST  Result Date: 03/20/2020 CLINICAL DATA:  Abdominal pain starting last night. Nausea, vomiting and fever. EXAM: CT ABDOMEN AND PELVIS WITH CONTRAST TECHNIQUE: Multidetector CT imaging of the abdomen and pelvis was performed using the standard protocol following bolus administration of intravenous contrast. CONTRAST:  05/18/2020 OMNIPAQUE IOHEXOL 300 MG/ML  SOLN COMPARISON:  CT abdomen dated 11/27/2018. FINDINGS: Lower chest: No acute abnormality. Hepatobiliary: No focal liver abnormality is identified. Gallbladder is absent. Stable chronic intrahepatic bile duct dilatation. Common bile duct also appears stable in size and configuration. Pancreas: Stable appearance of the pancreas, with atrophy of the pancreatic body and tail with associated pancreatic duct dilatation. Configuration of the duodenum and pancreatic head again suggest annular pancreas, stable. No peripancreatic fluid. No pseudocyst formation. Several pancreatic calcifications again noted. Spleen: Normal in size without focal abnormality. Adrenals/Urinary Tract: Adrenal glands are unremarkable. Several small renal stones bilaterally. Kidneys otherwise unremarkable without suspicious mass or hydronephrosis. No ureteral or bladder calculi. Bladder is  decompressed. Stomach/Bowel:  No dilated large or small bowel loops. Fairly extensive diverticulosis throughout the colon, most prominent within the sigmoid and descending colon but no focal inflammatory change to suggest acute diverticulitis. Anterior abdominal wall hernia which contains a focal segment of the small bowel but no obstruction or inflammation at the hernia site. Thickening of the walls of the stomach pylorus and/or duodenal bulb, stable compared to the previous exam. Vascular/Lymphatic: Upper abdominal and paraesophageal varices. No acute appearing vascular abnormality. No enlarged lymph nodes are seen in the abdomen or pelvis. Reproductive: Prostate is unremarkable. Other: No free fluid or abscess collection. No free intraperitoneal air. Musculoskeletal: No acute or suspicious osseous finding. Degenerative spondylosis of the lower lumbar spine, mild to moderate in degree. IMPRESSION: 1. Thickening of the walls of the distal stomach and duodenal bulb, similar to previous CT abdomen of 11/27/2018, suggesting recurrent or chronic gastritis/duodenitis. Consider endoscopic evaluation to ensure benignity. 2. No bowel obstruction. No evidence of acute pancreatitis. No free fluid or abscess collection. No hydronephrosis or ureteral calculi. 3. Anterior abdominal wall hernia which contains a focal segment of the small bowel but no obstruction or inflammation at the hernia site. 4. Colonic diverticulosis without evidence of acute diverticulitis. 5. Chronic intrahepatic bile duct dilatation, as previously described. Stable appearance of the atrophic pancreatic body and tail with associated pancreatic duct dilatation. Configuration of the duodenum and pancreatic head again suggests associated annular pancreas, stable. 6. Upper abdominal and paraesophageal varices. 7. Bilateral nephrolithiasis. Electronically Signed   By: Bary Richard M.D.   On: 03/20/2020 09:50    Procedures Procedures (including critical  care time)  Medications Ordered in ED Medications  sodium chloride 0.9 % bolus 1,000 mL (has no administration in time range)  promethazine (PHENERGAN) injection 25 mg (has no administration in time range)  sodium chloride 0.9 % bolus 1,000 mL (1,000 mLs Intravenous New Bag/Given 03/21/20 0806)  morphine 4 MG/ML injection 4 mg (4 mg Intravenous Given 03/21/20 0806)  ondansetron (ZOFRAN) injection 4 mg (4 mg Intravenous Given 03/21/20 0805)    ED Course  I have reviewed the triage vital signs and the nursing notes.  Pertinent labs & imaging results that were available during my care of the patient were reviewed by me and considered in my medical decision making (see chart for details).    MDM Rules/Calculators/A&P                          Pt is still very nauseous.  More IVFs given and phenergan ordered.  Symptoms consistent with acute pancreatitis although labs and CT do not reflect this.  Pt d/w Dr. Kerry Hough (triad) for admission.  Covid is negative.  Pt has been vaccinated.   Final Clinical Impression(s) / ED Diagnoses Final diagnoses:  Dehydration  Epigastric pain  Intractable vomiting with nausea, unspecified vomiting type    Rx / DC Orders ED Discharge Orders    None       Jacalyn Lefevre, MD 03/21/20 1013

## 2020-03-21 NOTE — ED Notes (Signed)
Receiving nurse unable to take report at this time.  

## 2020-03-21 NOTE — ED Notes (Signed)
MD made aware pt in the restroom throwing up green bile. pts wife requested to speak to MD. MD also made aware of this.

## 2020-03-21 NOTE — ED Triage Notes (Signed)
Pt seen yesterday for Pancreatitis and discharged to home. Pt back yesterday for "still feeling bad (N/V, and abdominal pain)" but left before being seen by EDP. Pt returns this am for same.

## 2020-03-22 ENCOUNTER — Observation Stay (HOSPITAL_COMMUNITY): Payer: Medicare Other | Admitting: Anesthesiology

## 2020-03-22 ENCOUNTER — Encounter (HOSPITAL_COMMUNITY): Payer: Self-pay | Admitting: Gastroenterology

## 2020-03-22 ENCOUNTER — Other Ambulatory Visit: Payer: Self-pay

## 2020-03-22 ENCOUNTER — Encounter (HOSPITAL_COMMUNITY)
Admission: EM | Disposition: A | Discharge status: Home / Self Care | Payer: Self-pay | Source: Home / Self Care | Attending: Internal Medicine

## 2020-03-22 DIAGNOSIS — N400 Enlarged prostate without lower urinary tract symptoms: Secondary | ICD-10-CM | POA: Diagnosis present

## 2020-03-22 DIAGNOSIS — J45909 Unspecified asthma, uncomplicated: Secondary | ICD-10-CM | POA: Diagnosis not present

## 2020-03-22 DIAGNOSIS — K859 Acute pancreatitis without necrosis or infection, unspecified: Secondary | ICD-10-CM | POA: Diagnosis present

## 2020-03-22 DIAGNOSIS — E7800 Pure hypercholesterolemia, unspecified: Secondary | ICD-10-CM | POA: Diagnosis not present

## 2020-03-22 DIAGNOSIS — E039 Hypothyroidism, unspecified: Secondary | ICD-10-CM | POA: Diagnosis not present

## 2020-03-22 DIAGNOSIS — E860 Dehydration: Secondary | ICD-10-CM

## 2020-03-22 DIAGNOSIS — Z9049 Acquired absence of other specified parts of digestive tract: Secondary | ICD-10-CM | POA: Diagnosis not present

## 2020-03-22 DIAGNOSIS — K219 Gastro-esophageal reflux disease without esophagitis: Secondary | ICD-10-CM | POA: Diagnosis not present

## 2020-03-22 DIAGNOSIS — Z20822 Contact with and (suspected) exposure to covid-19: Secondary | ICD-10-CM | POA: Diagnosis not present

## 2020-03-22 DIAGNOSIS — R1013 Epigastric pain: Secondary | ICD-10-CM | POA: Diagnosis not present

## 2020-03-22 DIAGNOSIS — F419 Anxiety disorder, unspecified: Secondary | ICD-10-CM | POA: Diagnosis present

## 2020-03-22 DIAGNOSIS — I252 Old myocardial infarction: Secondary | ICD-10-CM | POA: Diagnosis not present

## 2020-03-22 DIAGNOSIS — K8681 Exocrine pancreatic insufficiency: Secondary | ICD-10-CM | POA: Diagnosis present

## 2020-03-22 DIAGNOSIS — I10 Essential (primary) hypertension: Secondary | ICD-10-CM | POA: Diagnosis not present

## 2020-03-22 DIAGNOSIS — R109 Unspecified abdominal pain: Secondary | ICD-10-CM

## 2020-03-22 DIAGNOSIS — K2970 Gastritis, unspecified, without bleeding: Secondary | ICD-10-CM | POA: Diagnosis not present

## 2020-03-22 DIAGNOSIS — M797 Fibromyalgia: Secondary | ICD-10-CM | POA: Diagnosis not present

## 2020-03-22 DIAGNOSIS — I8500 Esophageal varices without bleeding: Secondary | ICD-10-CM | POA: Diagnosis not present

## 2020-03-22 DIAGNOSIS — R933 Abnormal findings on diagnostic imaging of other parts of digestive tract: Secondary | ICD-10-CM | POA: Diagnosis not present

## 2020-03-22 DIAGNOSIS — K2950 Unspecified chronic gastritis without bleeding: Secondary | ICD-10-CM | POA: Diagnosis not present

## 2020-03-22 DIAGNOSIS — Q451 Annular pancreas: Secondary | ICD-10-CM | POA: Diagnosis not present

## 2020-03-22 DIAGNOSIS — K439 Ventral hernia without obstruction or gangrene: Secondary | ICD-10-CM | POA: Diagnosis not present

## 2020-03-22 DIAGNOSIS — Z8249 Family history of ischemic heart disease and other diseases of the circulatory system: Secondary | ICD-10-CM | POA: Diagnosis not present

## 2020-03-22 DIAGNOSIS — Z8673 Personal history of transient ischemic attack (TIA), and cerebral infarction without residual deficits: Secondary | ICD-10-CM | POA: Diagnosis not present

## 2020-03-22 DIAGNOSIS — Z87891 Personal history of nicotine dependence: Secondary | ICD-10-CM | POA: Diagnosis not present

## 2020-03-22 DIAGNOSIS — I2510 Atherosclerotic heart disease of native coronary artery without angina pectoris: Secondary | ICD-10-CM | POA: Diagnosis present

## 2020-03-22 DIAGNOSIS — R112 Nausea with vomiting, unspecified: Secondary | ICD-10-CM | POA: Diagnosis not present

## 2020-03-22 DIAGNOSIS — K3189 Other diseases of stomach and duodenum: Secondary | ICD-10-CM | POA: Diagnosis not present

## 2020-03-22 DIAGNOSIS — F32A Depression, unspecified: Secondary | ICD-10-CM | POA: Diagnosis present

## 2020-03-22 DIAGNOSIS — K861 Other chronic pancreatitis: Secondary | ICD-10-CM | POA: Diagnosis not present

## 2020-03-22 HISTORY — PX: ESOPHAGOGASTRODUODENOSCOPY (EGD) WITH PROPOFOL: SHX5813

## 2020-03-22 HISTORY — PX: BIOPSY: SHX5522

## 2020-03-22 LAB — CBC
HCT: 40.3 % (ref 39.0–52.0)
Hemoglobin: 13.1 g/dL (ref 13.0–17.0)
MCH: 28.9 pg (ref 26.0–34.0)
MCHC: 32.5 g/dL (ref 30.0–36.0)
MCV: 89 fL (ref 80.0–100.0)
Platelets: 179 10*3/uL (ref 150–400)
RBC: 4.53 MIL/uL (ref 4.22–5.81)
RDW: 14.3 % (ref 11.5–15.5)
WBC: 4.9 10*3/uL (ref 4.0–10.5)
nRBC: 0 % (ref 0.0–0.2)

## 2020-03-22 LAB — BASIC METABOLIC PANEL
Anion gap: 7 (ref 5–15)
BUN: 11 mg/dL (ref 8–23)
CO2: 25 mmol/L (ref 22–32)
Calcium: 8.4 mg/dL — ABNORMAL LOW (ref 8.9–10.3)
Chloride: 109 mmol/L (ref 98–111)
Creatinine, Ser: 0.82 mg/dL (ref 0.61–1.24)
GFR, Estimated: >60 mL/min (ref >60)
Glucose, Bld: 100 mg/dL — ABNORMAL HIGH (ref 70–99)
Potassium: 3.5 mmol/L (ref 3.5–5.1)
Sodium: 141 mmol/L (ref 135–145)

## 2020-03-22 LAB — LIPASE, BLOOD: Lipase: 30 U/L (ref 11–51)

## 2020-03-22 LAB — HIV ANTIBODY (ROUTINE TESTING W REFLEX): HIV Screen 4th Generation wRfx: NONREACTIVE

## 2020-03-22 SURGERY — ESOPHAGOGASTRODUODENOSCOPY (EGD) WITH PROPOFOL
Anesthesia: General

## 2020-03-22 MED ORDER — PANTOPRAZOLE SODIUM 40 MG PO TBEC
40.0000 mg | DELAYED_RELEASE_TABLET | Freq: Two times a day (BID) | ORAL | Status: DC
  Administered 2020-03-22 – 2020-03-25 (×6): 40 mg via ORAL
  Filled 2020-03-22 (×6): qty 1

## 2020-03-22 MED ORDER — PROPOFOL 10 MG/ML IV BOLUS
INTRAVENOUS | Status: DC | PRN
  Administered 2020-03-22: 100 mg via INTRAVENOUS

## 2020-03-22 MED ORDER — SODIUM CHLORIDE 0.9 % IV SOLN: INTRAVENOUS | Status: DC

## 2020-03-22 MED ORDER — LIDOCAINE HCL (CARDIAC) PF 100 MG/5ML IV SOSY
PREFILLED_SYRINGE | INTRAVENOUS | Status: DC | PRN
  Administered 2020-03-22: 50 mg via INTRAVENOUS
  Administered 2020-03-22: 100 mg via INTRAVENOUS

## 2020-03-22 MED ORDER — LACTATED RINGERS IV SOLN: Freq: Once | INTRAVENOUS | Status: AC

## 2020-03-22 MED ORDER — STERILE WATER FOR IRRIGATION IR SOLN
Status: DC | PRN
  Administered 2020-03-22: 1.5 mL

## 2020-03-22 NOTE — Consult Note (Signed)
Referring Provider: Dr. Kerry Hough  Primary Care Physician:  [name], MD Primary Gastroenterologist:  [facility], last seen Sept 2020  Date of Admission: 03/21/20 Date of Consultation: 03/22/20  Reason for Consultation: pancreatitis vs duodenitis, evaluate need for endoscopy  HPI:  Justin [name] is a 65 y.o. 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 Underwent endoscopic necrosectomy in April 2014 with cystogastorostomy due to pseudocyst/necrotic area and then again in March 2015 had recurrent pseudocyst and underwent open surgical cystogastrostomy. Last seen at [facility], which is his primary GI, in Sept 2020. At that time, he reported intermittent abdominal pain that seemed to be precipitated by dietary behaviors. Recommendations for continuing Creon at that point. Imaging in Sept 2020 at [facility] with thickening of distal stomach wall and proximal duodenum, along with chronic occlusion of portal venous confluence. CT this admission with thickened distal stomach walls and duodenal bulb, similar to prior scan in 2020. 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 Lipase marginally elevated at 56.    Since September 2020 when last seen by [facility], had been doing well. Started to eat more fatty food, greasy foods. No alcohol since 2008. Notes chronic GERD. Takes Protonix daily. No NSAIDs other than baby aspirin. Takes Creon 36,000 unit, taking 3 with meals and 2 with snacks. Hasn't been eating 4-6 small meals per day.   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 Ate buffalo wings out at a restaurant. Pain onset a week ago Monday. Noted LUQ pain radiating around to his back, described as constant. Presented to the ED Saturday morning and pain calmed down,  then came back yesterday due to persistent pain. Associated N/V. No diarrhea today. Diarrhea started around Wednesday or Thursday, about once per day. No diarrhea today. No rectal bleeding that he is aware. Pain slightly improved from admission but still reported at 7/8 out of 10.   Past Medical History:  Diagnosis Date  . Alcoholism (HCC)   . Asthma   . Back pain   . Fibromyalgia   . GERD (gastroesophageal reflux disease)   . High cholesterol   . Hypertension   . Hypothyroidism   . Myocardial infarct [facility])    cardiologist in town  . Pancreatitis    with pseudocysts  . Pneumonia   . Stroke [facility])    right leg weakness    Past Surgical History:  Procedure Laterality Date  . BACK SURGERY    . CHOLECYSTECTOMY  2009  . cystogastrostomy  05/2013   ex laparotomy at [facility]  . FINGER SURGERY    . I & D EXTREMITY Bilateral 11/10/2017   Procedure: IRRIGATION AND DEBRIDEMENT, BILATERAL UPPER HANDS AND RIGHT FOOT. REVISION AMPUTATION RIGHT RING FINGER.;  Surgeon: Dominica Severin, MD;  Location: [facility] OR;  Service: Orthopedics;  Laterality: Bilateral;  . I & D EXTREMITY Bilateral 11/12/2017   Procedure: IRRIGATION AND DEBRIDEMENT RIGHT AND LEFT HANDS;  Surgeon: Dominica Severin, MD;  Location: [facility] OR;  Service: Orthopedics;  Laterality: Bilateral;  . INCISION AND DRAINAGE HIP Right 11/12/2017   Procedure: IRRIGATION AND DEBRIDEMENT RIGHT FOOT;  Surgeon: Dominica Severin, MD;  Location: [facility] OR;  Service: Orthopedics;  Laterality: Right;  . PANCREATIC PSEUDOCYST DRAINAGE  2014    Prior to Admission medications   Medication Sig  Start Date End Date Taking? Authorizing Provider  albuterol (VENTOLIN HFA) 108 (90 Base) MCG/ACT inhaler Inhale 1-2 puffs into the lungs every 6 (six) hours as needed for wheezing or shortness of breath.   Yes [provider]  aspirin 81 MG chewable tablet Chew 81 mg by mouth daily.    Yes [provider]  B Complex Vitamins (VITAMIN B COMPLEX PO) Take by mouth.    Yes [provider]  baclofen (LIORESAL) 10 MG tablet Take 10 mg by mouth 3 (three) times daily as needed for muscle spasms.  11/08/17  Yes [provider]  Cholecalciferol (VITAMIN D3) 1000 units CAPS Take 1,000 Units by mouth.    Yes [provider]  clonazePAM (KLONOPIN) 0.5 MG tablet Take 0.5 mg by mouth at bedtime. *May take one tablet three times daily as needed for anxiety* 12/14/14  Yes [provider]  DULoxetine (CYMBALTA) 60 MG capsule Take 60 mg by mouth every morning.   Yes [provider]  Fenofibric Acid 105 MG TABS Take 105 mg by mouth at bedtime.  12/28/14  Yes [provider]  Flax Oil-Fish Oil-Borage Oil CAPS Take 1 capsule by mouth daily.   Yes [provider]  folic acid (FOLVITE) 1 MG tablet Take 1 mg by mouth 2 (two) times daily. 01/21/20  Yes [provider]  gabapentin (NEURONTIN) 600 MG tablet Take 600 mg by mouth 3 (three) times daily.   Yes [provider]  ibuprofen (ADVIL,MOTRIN) 800 MG tablet Take 800 mg by mouth daily as needed for moderate pain.  07/18/17  Yes [provider]  levothyroxine (SYNTHROID, LEVOTHROID) 112 MCG tablet Take 112 mcg by mouth every morning.    Yes [provider]  methotrexate 2.5 MG tablet Take 15 mg by mouth once a week. 03/20/20  Yes [provider]  Multiple Vitamins-Minerals (MULTIVITAMIN ADULT PO) Take 1 tablet by mouth daily.   Yes [provider]  ondansetron (ZOFRAN ODT) 8 MG disintegrating tablet Take 1 tablet (8 mg total) by mouth every 8 (eight) hours as needed for nausea or vomiting. 11/16/17  Yes Xu, Fang, MD  oxyCODONE-acetaminophen (PERCOCET/ROXICET) 5-325 MG tablet Take 1 tablet by mouth every 6 (six) hours as needed for severe pain. 03/20/20  Yes Haviland, Julie, MD  Pancrelipase, Lip-Prot-Amyl, 6000-19000 units CPEP Take 2-4 capsules by mouth 5 (five) times daily. Patient takes 4 capsules by mouth three times a day with  meals and 2 capsules by mouth twice a day with snacks   Yes [provider]  pantoprazole (PROTONIX) 40 MG tablet Take 1 tablet (40 mg total) by mouth daily. 04/20/17  Yes Madera, Carlos, MD  polyethylene glycol (MIRALAX / GLYCOLAX) packet Take 17 g by mouth daily as needed for mild constipation.  07/19/12  Yes [provider]  tamsulosin (FLOMAX) 0.4 MG CAPS capsule Take 0.4 mg by mouth 2 (two) times daily.  12/28/14  Yes [provider]  tiZANidine (ZANAFLEX) 2 MG tablet Take 2 mg by mouth every 8 (eight) hours as needed. 03/08/20  Yes [provider]  traMADol (ULTRAM) 50 MG tablet Take 50 mg by mouth every 6 (six) hours as needed. 10/16/19  Yes [provider]    Current Facility-Administered Medications  Medication Dose Route Frequency Provider Last Rate Last Admin  . acetaminophen (TYLENOL) tablet 650 mg  650 mg Oral Q6H PRN Memon, Jehanzeb, MD       Or  . acetaminophen (TYLENOL) suppository 650 mg  650   mg Rectal Q6H PRN Erick Blinks, MD      . aspirin chewable tablet 81 mg  81 mg Oral Daily Erick Blinks, MD   81 mg at 03/22/20 0904  . ciprofloxacin (CIPRO) tablet 500 mg  500 mg Oral BID Erick Blinks, MD   500 mg at 03/22/20 0904  . clonazePAM (KLONOPIN) tablet 0.5 mg  0.5 mg Oral QHS Erick Blinks, MD   0.5 mg at 03/21/20 2239  . DULoxetine (CYMBALTA) DR capsule 60 mg  60 mg Oral q morning - 10a Erick Blinks, MD   60 mg at 03/22/20 0906  . gabapentin (NEURONTIN) capsule 600 mg  600 mg Oral TID Erick Blinks, MD   600 mg at 03/22/20 0904  . lactated ringers infusion   Intravenous Continuous Erick Blinks, MD 100 mL/hr at 03/22/20 1139 New Bag at 03/22/20 1139  . levothyroxine (SYNTHROID) tablet 112 mcg  112 mcg Oral q morning - 10a Erick Blinks, MD   112 mcg at 03/22/20 1138  . lipase/protease/amylase (CREON) capsule 36,000 Units  36,000 Units Oral TID with meals Erick Blinks, MD   36,000 Units at 03/21/20 1630  . metroNIDAZOLE  (FLAGYL) tablet 500 mg  500 mg Oral Q8H Erick Blinks, MD   500 mg at 03/21/20 2255  . morphine 2 MG/ML injection 2 mg  2 mg Intravenous Q2H PRN Erick Blinks, MD   2 mg at 03/22/20 0753  . pantoprazole (PROTONIX) injection 40 mg  40 mg Intravenous Q12H Erick Blinks, MD   40 mg at 03/22/20 0907  . promethazine (PHENERGAN) injection 12.5 mg  12.5 mg Intravenous Q6H PRN Erick Blinks, MD   12.5 mg at 03/21/20 1543  . tamsulosin (FLOMAX) capsule 0.4 mg  0.4 mg Oral BID Erick Blinks, MD   0.4 mg at 03/22/20 0905    Allergies as of 03/21/2020 - Review Complete 03/21/2020  Allergen Reaction Noted  . Amoxicillin Hives and Swelling 09/18/2010  . Dilaudid [hydromorphone hcl] Swelling 09/18/2010  . Hydromorphone Swelling 09/29/2014    Family History  Problem Relation Age of Onset  . Diabetes Mother   . Heart attack Mother   . Diabetes Brother   . Heart attack Brother   . Brain cancer Brother   . Diabetes Brother   . Cancer Father        ? stomach  . Heart attack Father   . Pancreatitis Neg Hx   . Colon cancer Neg Hx   . Colon polyps Neg Hx     Social History   Socioeconomic History  . Marital status: Married    Spouse name: Not on file  . Number of children: 52  . Years of education: Not on file  . Highest education level: Not on file  Occupational History  . Not on file  Tobacco Use  . Smoking status: Former Smoker    Packs/day: 0.12    Years: 5.00    Pack years: 0.60    Types: Cigarettes  . Smokeless tobacco: Never Used  . Tobacco comment: 11/12/2017 "quit smoking in the 1980s/1990s"  Vaping Use  . Vaping Use: Never used  Substance and Sexual Activity  . Alcohol use: No    Comment:  previously used alcohol and quit  ~ 2008  . Drug use: Yes    Types: Marijuana    Comment: daily  . Sexual activity: Not Currently  Other Topics Concern  . Not on file  Social History Narrative   Lives with his wife, he is on  disability because of his back surgery.  Uses a cane  to ambulate.     Social Determinants of Health   Financial Resource Strain: Not on file  Food Insecurity: Not on file  Transportation Needs: Not on file  Physical Activity: Not on file  Stress: Not on file  Social Connections: Not on file  Intimate Partner Violence: Not on file    Review of Systems: Gen: Denies fever, chills, loss of appetite, change in weight or weight loss CV: Denies chest pain, heart palpitations, syncope, edema  Resp: Denies shortness of breath with rest, cough, wheezing GI: see HPI GU : Denies urinary burning, urinary frequency, urinary incontinence.  MS: Denies joint pain,swelling, cramping Derm: Denies rash, itching, dry skin Psych: Denies depression, anxiety,confusion, or memory loss Heme: Denies bruising, bleeding, and enlarged lymph nodes.  Physical Exam: Vital signs in last 24 hours: Temp:  [97.9 F (36.6 C)-98.9 F (37.2 C)] 97.9 F (36.6 C) (01/10 0536) Pulse Rate:  [57-90] 59 (01/10 0536) Resp:  [14-20] 16 (01/10 0536) BP: (105-141)/(72-85) 105/73 (01/10 0536) SpO2:  [96 %-100 %] 96 % (01/10 0536) Weight:  [72.1 kg] 72.1 kg (01/09 1501) Last BM Date: 03/21/20 General:   Alert,  Well-developed, well-nourished, pleasant and cooperative in NAD Head:  Normocephalic and atraumatic. Eyes:  Sclera clear, no icterus.   Conjunctiva pink. Ears:  Normal auditory acuity. Nose:  No deformity, discharge,  or lesions. Mouth:  No deformity or lesions, dentition normal. Lungs:  Clear throughout to auscultation.    Heart:  S1 S2 present without murmurs Abdomen:  Soft, moderately TTP epigastric and LUQ and nondistended. No masses, hepatosplenomegaly. Normal bowel sounds. Well-healed midline incision Rectal:  Deferred   Msk:  Symmetrical without gross deformities. Normal posture. Extremities:  Without  edema. Neurologic:  Alert and  oriented x4 Psych:  Alert and cooperative. Normal mood and affect.  Intake/Output from previous day: 01/09 0701 - 01/10  0700 In: 1240 [P.O.:240; IV Piggyback:1000] Out: 251 [Urine:250; Stool:1] Intake/Output this shift: No intake/output data recorded.  Lab Results: Recent Labs    03/20/20 0715 03/21/20 0745 03/22/20 0447  WBC 5.4 14.1* 4.9  HGB 15.6 16.8 13.1  HCT 46.4 49.8 40.3  PLT 197 218 179   BMET Recent Labs    03/20/20 0715 03/21/20 0745 03/22/20 0447  NA 138 143 141  K 3.5 4.6 3.5  CL 105 106 109  CO2 23 25 25   GLUCOSE 123* 121* 100*  BUN 10 16 11   CREATININE 0.88 0.92 0.82  CALCIUM 9.3 10.0 8.4*   LFT Recent Labs    03/20/20 0715 03/21/20 0745  PROT 7.9 8.5*  ALBUMIN 4.4 4.8  AST 24 29  ALT 36 54*  ALKPHOS 97 113  BILITOT 1.0 1.2   Lab Results  Component Value Date   LIPASE 30 03/22/2020     Studies/Results: CT abd/pelvis with contrast 03/20/20 IMPRESSION: 1. Thickening of the walls of the distal stomach and duodenal bulb, similar to previous CT abdomen of 11/27/2018, suggesting recurrent or chronic gastritis/duodenitis. Consider endoscopic evaluation to ensure benignity. 2. No bowel obstruction. No evidence of acute pancreatitis. No free fluid or abscess collection. No hydronephrosis or ureteral calculi. 3. Anterior abdominal wall hernia which contains a focal segment of the small bowel but no obstruction or inflammation at the hernia site. 4. Colonic diverticulosis without evidence of acute diverticulitis. 5. Chronic intrahepatic bile duct dilatation, as previously described. Stable appearance of the atrophic pancreatic body and tail with associated pancreatic duct dilatation.  Configuration of the duodenum and pancreatic head again suggests associated annular pancreas, stable. 6. Upper abdominal and paraesophageal varices. 7. Bilateral nephrolithiasis.      Impression: 65 year old male with history of annular pancreas, chronic pancreatitis felt secondary to ETOH, prior episode of necrotizing pancreatis in the remote past undergoing endoscopic  necrosectomy in April 2014 with 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  Lipase only marginally elevated at 56 and not indicative of acute pancreatitis, now 30 today, and CT on 1/8 without obvious pancreatitis but did note thickening of distal stomach walls and duodenal bulb, which was also seen on outside CT [facility]) in Sept 2020.   Followed by [facility] and well known to them. Since last visit in Sept 2020, he had been doing well on pancreatic enzymes; however, he endorses veering from low-fat diet and frequent small meals to eating more generously and without caution. No alcohol since 2008. Chronic GERD on PPI. Denies NSAIDs except 81 mg aspirin daily listed.  Unable to exclude mild bout of pancreatitis but at this point needs diagnostic EGD due to persistent findings on CT of thickened distal stomach and duodenal bulb. He reports no dedicated EGD historically, only ERCPs (around 2014). Known annular pancreas. 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    Diarrhea: resolved. Doubt infectious process.    Plan: Remain NPO Proceed with EGD with Propofol by Dr. Marletta Lor. Discussed risks and benefits with stated understanding Continue IV PPI BID Continue IV hydration Supportive measures [facility] follow-up for dedicated pancreas care Resume Creon 36,000 units taking 3 with meals and 2 with snacks when diet advanced   Gelene Mink, PhD, ANP-BC [facility]     LOS: 0 days    03/22/2020, 12:13 PM

## 2020-03-22 NOTE — Progress Notes (Signed)
PROGRESS NOTE    Justin [name]  NLG:[id number] DOB: 11/07/1955 DOA: 03/21/2020 PCP: [name], MD   Chief Complaint  Patient presents with  . Abdominal Pain    Brief Narrative:  As per H&P written by Dr. Kerry Hough on 03/21/2020 Justin [name] is a 65 y.o. male with medical history significant of chronic pancreatitis, GERD, hypertension, presents to the hospital with complaints of abdominal pain, nausea, vomiting, diarrhea.  He reports that he has had the symptoms for the past 3 days.  They have progressively gotten worse.  He came to the ER yesterday for evaluation.  At that time he had a CT abdomen done that did show some gastritis versus duodenitis.  [name] were relatively unrevealing.  He was treated symptomatically and discharged home.  He returned to the hospital today due to recurrence/worsening of symptoms.  He reports that he has been unable to keep anything down.  Vomitus is dark green in color.  He is thrown up multiple times.  He is also having several loose stools which are dark green in color as well.  He reports being febrile over the past 3 days.  Symptoms did start after he had chicken wings.  No other sick contacts.  He feels that his discomfort is typical of his pancreatitis pain.  ED Course: Upon arrival to the emergency room, he was noted to be mildly dehydrated, tachycardic.  He was started on IV fluids with resolution of tachycardia.  [name] are relatively unrevealing, lipase is only minimally elevated.  He continued to vomit in the emergency room.  He has been referred for admission.  Assessment & Plan: 1-abdominal pain, nausea/vomiting and diarrhea: Appears to be secondary to acute on chronic pancreatitis. -Continue supportive care, IV fluids, antiemetics and analgesic -Slowly advance diet as tolerated -Resume the use of cream.  2-gastritis -Appreciate assistance and endoscopic evaluation by GI service -Continue PPI twice a day for 8 weeks -Avoid the use of  NSAIDs. -No active bleeding or open ulcers appreciated on examination.  Normal duodenal bulb.  3-Hypothyroidism -Continue Synthroid.  4-dehydration -In the setting of GI losses and poor oral intake -Continue the use of IV fluids and supportive care -Patient encouraged to maintain adequate oral hydration. -Slowly advancing diet.  5-vomiting/diarrhea -As mentioned above most likely in the setting of acute on chronic pancreatitis -Due to history of fevers GI pathogen panel C. difficile was ordered at time of admission. -Currently reporting loose stools; no watery component. Also less frequent -continue empiric abx's for now.  DVT prophylaxis: SCDs. Code Status: Full code. Family Communication: No family at bedside. Disposition:   Status is: Observation  Dispo: The patient is from: home              Anticipated d/c is to: Home              Anticipated d/c date is: 1-2 days.              Patient currently no medically ready for discharge.  Still reporting intermittent nausea and having abdominal pain.  Status post EGD that demonstrated no peptic ulcer, no esophageal or duodenal bulb abnormalities.  Positive gastritis.  NSAIDs will be encourage not to be used.  Continue PPI twice a day for a total of 8 weeks.  Slowly advance diet as tolerated, continue supportive care, antiemetics, analgesics and the use of Creon.      Consultants:   GI service   Procedures:  Endoscopy: Demonstrating gastritis; no active bleeding  or abnormalities in his esophagus.  Normal duodenal bulb appreciated.  Antimicrobials:  Cipro and Flagyl.  Subjective: Patient reports mild intermittent nausea but no further vomiting; still with some mid epigastric region abdominal discomfort.  Would like to have some clear liquids.  Objective: Vitals:   03/22/20 1438 03/22/20 1441 03/22/20 1446 03/22/20 1456  BP: (!) 84/5 (!) 95/59 106/66 109/61  Pulse: 71 66 75   Resp: 14  15   Temp: 97.8 F (36.6 C)      TempSrc: Oral     SpO2: 95% 95% (!) 65%   Weight:      Height:        Intake/Output Summary (Last 24 hours) at 03/22/2020 1514 Last data filed at 03/22/2020 1432 Gross per 24 hour  Intake 340 ml  Output 251 ml  Net 89 ml   Filed Weights   03/21/20 0531 03/21/20 1501  Weight: 70.4 kg 72.1 kg    Examination: General exam: Afebrile, no chest pain, no shortness of breath.  Reports some improvement in his abdominal pain, still having loose stools and is requiring IV pain medications.  Expressed mild intermittent nausea but no further vomiting. Respiratory system: Clear to auscultation. Respiratory effort normal.  Good oxygen saturation on room air. Cardiovascular system: S1 & S2 heard, RRR. No JVD, murmurs, rubs, gallops or clicks. No pedal edema. Gastrointestinal system: Abdomen is nondistended, soft and mildly tender to palpation in midepigastric region. No organomegaly or masses felt. Normal bowel sounds heard. Central nervous system: Alert and oriented. No focal neurological deficits. Extremities: no cyanosis or clubbing. Skin: No rashes, no petechiae. Psychiatry: Judgement and insight appear normal. Mood & affect appropriate.     Data Reviewed: I have personally reviewed following [name] and imaging studies  CBC: Recent [name]  Lab 03/20/20 0715 03/21/20 0745 03/22/20 0447  WBC 5.4 14.1* 4.9  NEUTROABS  --  12.7*  --   HGB 15.6 16.8 13.1  HCT 46.4 49.8 40.3  MCV 87.2 87.5 89.0  PLT 197 218 179    Basic Metabolic Panel: Recent [name]  Lab 03/20/20 0715 03/21/20 0745 03/22/20 0447  NA 138 143 141  K 3.5 4.6 3.5  CL 105 106 109  CO2 23 25 25   GLUCOSE 123* 121* 100*  BUN 10 16 11   CREATININE 0.88 0.92 0.82  CALCIUM 9.3 10.0 8.4*    GFR: Estimated Creatinine Clearance: 85.1 mL/min (by C-G formula based on SCr of 0.82 mg/dL).  Liver Function Tests: Recent [name]  Lab 03/20/20 0715 03/21/20 0745  AST 24 29  ALT 36 54*  ALKPHOS 97 113  BILITOT 1.0 1.2  PROT  7.9 8.5*  ALBUMIN 4.4 4.8    CBG: No results for input(s): GLUCAP in the last 168 hours.   Recent Results (from the past 240 hour(s))  Resp Panel by RT-PCR (Flu A&B, Covid) Nasopharyngeal Swab     Status: None   Collection Time: 03/21/20  7:46 AM   Specimen: Nasopharyngeal Swab; Nasopharyngeal(NP) swabs in vial transport medium  Result Value Ref Range Status   SARS Coronavirus 2 by RT PCR NEGATIVE NEGATIVE Final    Comment: (NOTE) SARS-CoV-2 target nucleic acids are NOT DETECTED.  The SARS-CoV-2 RNA is generally detectable in upper respiratory specimens during the acute phase of infection. The lowest concentration of SARS-CoV-2 viral copies this assay can detect is 138 copies/mL. A negative result does not preclude SARS-Cov-2 infection and should not be used as the sole basis for treatment or other patient management decisions.  A negative result may occur with  improper specimen collection/handling, submission of specimen other than nasopharyngeal swab, presence of viral mutation(s) within the areas targeted by this assay, and inadequate number of viral copies(<138 copies/mL). A negative result must be combined with clinical observations, patient history, and epidemiological information. The expected result is Negative.  Fact Sheet for Patients:  BloggerCourse.com  Fact Sheet for Healthcare Providers:  SeriousBroker.it  This test is no t yet approved or cleared by the [place] FDA and  has been authorized for detection and/or diagnosis of SARS-CoV-2 by FDA under an Emergency Use Authorization (EUA). This EUA will remain  in effect (meaning this test can be used) for the duration of the COVID-19 declaration under Section 564(b)(1) of the Act, 21 U.S.C.section 360bbb-3(b)(1), unless the authorization is terminated  or revoked sooner.       Influenza A by PCR NEGATIVE NEGATIVE Final   Influenza B by PCR NEGATIVE  NEGATIVE Final    Comment: (NOTE) The Xpert Xpress SARS-CoV-2/FLU/RSV plus assay is intended as an aid in the diagnosis of influenza from Nasopharyngeal swab specimens and should not be used as a sole basis for treatment. Nasal washings and aspirates are unacceptable for Xpert Xpress SARS-CoV-2/FLU/RSV testing.  Fact Sheet for Patients: BloggerCourse.com  Fact Sheet for Healthcare Providers: SeriousBroker.it  This test is not yet approved or cleared by the [place] FDA and has been authorized for detection and/or diagnosis of SARS-CoV-2 by FDA under an Emergency Use Authorization (EUA). This EUA will remain in effect (meaning this test can be used) for the duration of the COVID-19 declaration under Section 564(b)(1) of the Act, 21 U.S.C. section 360bbb-3(b)(1), unless the authorization is terminated or revoked.  Performed at [facility], [address], [place], [place] [address]      Radiology Studies: No results found.   Scheduled Meds: . aspirin  81 mg Oral Daily  . ciprofloxacin  500 mg Oral BID  . clonazePAM  0.5 mg Oral QHS  . DULoxetine  60 mg Oral q morning - 10a  . gabapentin  600 mg Oral TID  . levothyroxine  112 mcg Oral q morning - 10a  . lipase/protease/amylase  36,000 Units Oral TID with meals  . metroNIDAZOLE  500 mg Oral Q8H  . pantoprazole  40 mg Oral BID  . tamsulosin  0.4 mg Oral BID   Continuous Infusions: . lactated ringers 100 mL/hr at 03/22/20 1139     LOS: 0 days    Time spent: 30 minutes.    Vassie Loll, MD [facility]   To contact the attending provider between 7A-7P or the covering provider during after hours 7P-7A, please log into the web site [email or web address] and access using universal [place] password for that web site. If you do not have the password, please call the hospital operator.  03/22/2020, 3:14 PM

## 2020-03-22 NOTE — Op Note (Signed)
[facility] Patient Name: Justin [name] Procedure Date: 03/22/2020 2:23 PM MRN: [id number] Date of Birth: 1955/03/22 Attending MD: Elon Alas. Abbey Chatters DO CSN: [id number] Age: 65 Admit Type: Inpatient Procedure:                Upper GI endoscopy Indications:              Epigastric abdominal pain, Abnormal CT of the GI                            tract Providers:                Elon Alas. Abbey Chatters, DO, Tacy Learn,                            Technician Referring MD:              Medicines:                See the Anesthesia note for documentation of the                            administered medications Complications:            No immediate complications. Estimated Blood Loss:     Estimated blood loss was minimal. Procedure:                Pre-Anesthesia Assessment:                           - The anesthesia plan was to use monitored                            anesthesia care (MAC).                           After obtaining informed consent, the endoscope was                            passed under direct vision. Throughout the                            procedure, the patient's blood pressure, pulse, and                            oxygen saturations were monitored continuously. The                            [id number]-[id number] ([id number]) scope was introduced through the                            mouth, and advanced to the second part of duodenum.                            The upper GI endoscopy was accomplished without                            difficulty. The patient tolerated the procedure  well. Scope In: 2:29:46 PM Scope Out: 2:32:40 PM Total Procedure Duration: 0 hours 2 minutes 54 seconds  Findings:      There is no endoscopic evidence of areas of erosion, esophagitis,       inflammation, ulcerations or varices in the entire esophagus.      Diffuse moderate inflammation characterized by erosions and erythema was       found in the entire  examined stomach. Biopsies were taken with a cold       forceps for Helicobacter pylori testing.      The duodenal bulb, first portion of the duodenum and second portion of       the duodenum were normal. Impression:               - Gastritis. Biopsied.                           - Normal duodenal bulb, first portion of the                            duodenum and second portion of the duodenum. Moderate Sedation:      Per Anesthesia Care Recommendation:           - Return patient to hospital ward for ongoing care.                           - Advance diet as tolerated.                           - Use a proton pump inhibitor PO BID for 8 weeks.                           - No ibuprofen, naproxen, or other non-steroidal                            anti-inflammatory drugs.                           - GI to continue to follow. Procedure Code(s):        --- Professional ---                           [phone], Esophagogastroduodenoscopy, flexible,                            transoral; with biopsy, single or multiple Diagnosis Code(s):        --- Professional ---                           K29.70, Gastritis, unspecified, without bleeding                           R10.13, Epigastric pain                           R93.3, Abnormal findings on diagnostic imaging of  other parts of digestive tract CPT copyright 2019 [employer]. All rights reserved. The codes documented in this report are preliminary and upon coder review may  be revised to meet current compliance requirements. Elon Alas. Abbey Chatters, DO [place] Abbey Chatters, DO 03/22/2020 2:46:14 PM This report has been signed electronically. Number of Addenda: 0

## 2020-03-22 NOTE — H&P (View-Only) (Signed)
Referring Provider: Dr. Kerry Hough  Primary Care Physician:  [name], MD Primary Gastroenterologist:  [facility], last seen Sept 2020  Date of Admission: 03/21/20 Date of Consultation: 03/22/20  Reason for Consultation: pancreatitis vs duodenitis, evaluate need for endoscopy  HPI:  Justin [name] is a 65 y.o. 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 Underwent endoscopic necrosectomy in April 2014 with cystogastorostomy due to pseudocyst/necrotic area and then again in March 2015 had recurrent pseudocyst and underwent open surgical cystogastrostomy. Last seen at [facility], which is his primary GI, in Sept 2020. At that time, he reported intermittent abdominal pain that seemed to be precipitated by dietary behaviors. Recommendations for continuing Creon at that point. Imaging in Sept 2020 at [facility] with thickening of distal stomach wall and proximal duodenum, along with chronic occlusion of portal venous confluence. CT this admission with thickened distal stomach walls and duodenal bulb, similar to prior scan in 2020. 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 Lipase marginally elevated at 56.    Since September 2020 when last seen by [facility], had been doing well. Started to eat more fatty food, greasy foods. No alcohol since 2008. Notes chronic GERD. Takes Protonix daily. No NSAIDs other than baby aspirin. Takes Creon 36,000 unit, taking 3 with meals and 2 with snacks. Hasn't been eating 4-6 small meals per day.   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 Ate buffalo wings out at a restaurant. Pain onset a week ago Monday. Noted LUQ pain radiating around to his back, described as constant. Presented to the ED Saturday morning and pain calmed down,  then came back yesterday due to persistent pain. Associated N/V. No diarrhea today. Diarrhea started around Wednesday or Thursday, about once per day. No diarrhea today. No rectal bleeding that he is aware. Pain slightly improved from admission but still reported at 7/8 out of 10.   Past Medical History:  Diagnosis Date  . Alcoholism (HCC)   . Asthma   . Back pain   . Fibromyalgia   . GERD (gastroesophageal reflux disease)   . High cholesterol   . Hypertension   . Hypothyroidism   . Myocardial infarct [facility])    cardiologist in town  . Pancreatitis    with pseudocysts  . Pneumonia   . Stroke [facility])    right leg weakness    Past Surgical History:  Procedure Laterality Date  . BACK SURGERY    . CHOLECYSTECTOMY  2009  . cystogastrostomy  05/2013   ex laparotomy at [facility]  . FINGER SURGERY    . I & D EXTREMITY Bilateral 11/10/2017   Procedure: IRRIGATION AND DEBRIDEMENT, BILATERAL UPPER HANDS AND RIGHT FOOT. REVISION AMPUTATION RIGHT RING FINGER.;  Surgeon: Dominica Severin, MD;  Location: [facility] OR;  Service: Orthopedics;  Laterality: Bilateral;  . I & D EXTREMITY Bilateral 11/12/2017   Procedure: IRRIGATION AND DEBRIDEMENT RIGHT AND LEFT HANDS;  Surgeon: Dominica Severin, MD;  Location: [facility] OR;  Service: Orthopedics;  Laterality: Bilateral;  . INCISION AND DRAINAGE HIP Right 11/12/2017   Procedure: IRRIGATION AND DEBRIDEMENT RIGHT FOOT;  Surgeon: Dominica Severin, MD;  Location: [facility] OR;  Service: Orthopedics;  Laterality: Right;  . PANCREATIC PSEUDOCYST DRAINAGE  2014    Prior to Admission medications   Medication Sig  Start Date End Date Taking? Authorizing Provider  albuterol (VENTOLIN HFA) 108 (90 Base) MCG/ACT inhaler Inhale 1-2 puffs into the lungs every 6 (six) hours as needed for wheezing or shortness of breath.   Yes [provider]  aspirin 81 MG chewable tablet Chew 81 mg by mouth daily.    Yes [provider]  B Complex Vitamins (VITAMIN B COMPLEX PO) Take by mouth.    Yes [provider]  baclofen (LIORESAL) 10 MG tablet Take 10 mg by mouth 3 (three) times daily as needed for muscle spasms.  11/08/17  Yes [provider]  Cholecalciferol (VITAMIN D3) 1000 units CAPS Take 1,000 Units by mouth.    Yes [provider]  clonazePAM (KLONOPIN) 0.5 MG tablet Take 0.5 mg by mouth at bedtime. *May take one tablet three times daily as needed for anxiety* 12/14/14  Yes [provider]  DULoxetine (CYMBALTA) 60 MG capsule Take 60 mg by mouth every morning.   Yes [provider]  Fenofibric Acid 105 MG TABS Take 105 mg by mouth at bedtime.  12/28/14  Yes [provider]  Flax Oil-Fish Oil-Borage Oil CAPS Take 1 capsule by mouth daily.   Yes [provider]  folic acid (FOLVITE) 1 MG tablet Take 1 mg by mouth 2 (two) times daily. 01/21/20  Yes [provider]  gabapentin (NEURONTIN) 600 MG tablet Take 600 mg by mouth 3 (three) times daily.   Yes [provider]  ibuprofen (ADVIL,MOTRIN) 800 MG tablet Take 800 mg by mouth daily as needed for moderate pain.  07/18/17  Yes [provider]  levothyroxine (SYNTHROID, LEVOTHROID) 112 MCG tablet Take 112 mcg by mouth every morning.    Yes [provider]  methotrexate 2.5 MG tablet Take 15 mg by mouth once a week. 03/20/20  Yes [provider]  Multiple Vitamins-Minerals (MULTIVITAMIN ADULT PO) Take 1 tablet by mouth daily.   Yes [provider]  ondansetron (ZOFRAN ODT) 8 MG disintegrating tablet Take 1 tablet (8 mg total) by mouth every 8 (eight) hours as needed for nausea or vomiting. 11/16/17  Yes Albertine Grates, MD  oxyCODONE-acetaminophen (PERCOCET/ROXICET) 5-325 MG tablet Take 1 tablet by mouth every 6 (six) hours as needed for severe pain. 03/20/20  Yes Jacalyn Lefevre, MD  Pancrelipase, Lip-Prot-Amyl, 6000-19000 units CPEP Take 2-4 capsules by mouth 5 (five) times daily. Patient takes 4 capsules by mouth three times a day with  meals and 2 capsules by mouth twice a day with snacks   Yes [provider]  pantoprazole (PROTONIX) 40 MG tablet Take 1 tablet (40 mg total) by mouth daily. 04/20/17  Yes Vassie Loll, MD  polyethylene glycol [facility] / GLYCOLAX) packet Take 17 g by mouth daily as needed for mild constipation.  07/19/12  Yes [provider]  tamsulosin (FLOMAX) 0.4 MG CAPS capsule Take 0.4 mg by mouth 2 (two) times daily.  12/28/14  Yes [provider]  tiZANidine (ZANAFLEX) 2 MG tablet Take 2 mg by mouth every 8 (eight) hours as needed. 03/08/20  Yes [provider]  traMADol (ULTRAM) 50 MG tablet Take 50 mg by mouth every 6 (six) hours as needed. 10/16/19  Yes [provider]    Current Facility-Administered Medications  Medication Dose Route Frequency Provider Last Rate Last Admin  . acetaminophen (TYLENOL) tablet 650 mg  650 mg Oral Q6H PRN Erick Blinks, MD       Or  . acetaminophen (TYLENOL) suppository 650 mg  650  mg Rectal Q6H PRN Erick Blinks, MD      . aspirin chewable tablet 81 mg  81 mg Oral Daily Erick Blinks, MD   81 mg at 03/22/20 0904  . ciprofloxacin (CIPRO) tablet 500 mg  500 mg Oral BID Erick Blinks, MD   500 mg at 03/22/20 0904  . clonazePAM (KLONOPIN) tablet 0.5 mg  0.5 mg Oral QHS Erick Blinks, MD   0.5 mg at 03/21/20 2239  . DULoxetine (CYMBALTA) DR capsule 60 mg  60 mg Oral q morning - 10a Erick Blinks, MD   60 mg at 03/22/20 0906  . gabapentin (NEURONTIN) capsule 600 mg  600 mg Oral TID Erick Blinks, MD   600 mg at 03/22/20 0904  . lactated ringers infusion   Intravenous Continuous Erick Blinks, MD 100 mL/hr at 03/22/20 1139 New Bag at 03/22/20 1139  . levothyroxine (SYNTHROID) tablet 112 mcg  112 mcg Oral q morning - 10a Erick Blinks, MD   112 mcg at 03/22/20 1138  . lipase/protease/amylase (CREON) capsule 36,000 Units  36,000 Units Oral TID with meals Erick Blinks, MD   36,000 Units at 03/21/20 1630  . metroNIDAZOLE  (FLAGYL) tablet 500 mg  500 mg Oral Q8H Erick Blinks, MD   500 mg at 03/21/20 2255  . morphine 2 MG/ML injection 2 mg  2 mg Intravenous Q2H PRN Erick Blinks, MD   2 mg at 03/22/20 0753  . pantoprazole (PROTONIX) injection 40 mg  40 mg Intravenous Q12H Erick Blinks, MD   40 mg at 03/22/20 0907  . promethazine (PHENERGAN) injection 12.5 mg  12.5 mg Intravenous Q6H PRN Erick Blinks, MD   12.5 mg at 03/21/20 1543  . tamsulosin (FLOMAX) capsule 0.4 mg  0.4 mg Oral BID Erick Blinks, MD   0.4 mg at 03/22/20 0905    Allergies as of 03/21/2020 - Review Complete 03/21/2020  Allergen Reaction Noted  . Amoxicillin Hives and Swelling 09/18/2010  . Dilaudid [hydromorphone hcl] Swelling 09/18/2010  . Hydromorphone Swelling 09/29/2014    Family History  Problem Relation Age of Onset  . Diabetes Mother   . Heart attack Mother   . Diabetes Brother   . Heart attack Brother   . Brain cancer Brother   . Diabetes Brother   . Cancer Father        ? stomach  . Heart attack Father   . Pancreatitis Neg Hx   . Colon cancer Neg Hx   . Colon polyps Neg Hx     Social History   Socioeconomic History  . Marital status: Married    Spouse name: Not on file  . Number of children: 52  . Years of education: Not on file  . Highest education level: Not on file  Occupational History  . Not on file  Tobacco Use  . Smoking status: Former Smoker    Packs/day: 0.12    Years: 5.00    Pack years: 0.60    Types: Cigarettes  . Smokeless tobacco: Never Used  . Tobacco comment: 11/12/2017 "quit smoking in the 1980s/1990s"  Vaping Use  . Vaping Use: Never used  Substance and Sexual Activity  . Alcohol use: No    Comment:  previously used alcohol and quit  ~ 2008  . Drug use: Yes    Types: Marijuana    Comment: daily  . Sexual activity: Not Currently  Other Topics Concern  . Not on file  Social History Narrative   Lives with his wife, he is on  disability because of his back surgery.  Uses a cane  to ambulate.     Social Determinants of Health   Financial Resource Strain: Not on file  Food Insecurity: Not on file  Transportation Needs: Not on file  Physical Activity: Not on file  Stress: Not on file  Social Connections: Not on file  Intimate Partner Violence: Not on file    Review of Systems: Gen: Denies fever, chills, loss of appetite, change in weight or weight loss CV: Denies chest pain, heart palpitations, syncope, edema  Resp: Denies shortness of breath with rest, cough, wheezing GI: see HPI GU : Denies urinary burning, urinary frequency, urinary incontinence.  MS: Denies joint pain,swelling, cramping Derm: Denies rash, itching, dry skin Psych: Denies depression, anxiety,confusion, or memory loss Heme: Denies bruising, bleeding, and enlarged lymph nodes.  Physical Exam: Vital signs in last 24 hours: Temp:  [97.9 F (36.6 C)-98.9 F (37.2 C)] 97.9 F (36.6 C) (01/10 0536) Pulse Rate:  [57-90] 59 (01/10 0536) Resp:  [14-20] 16 (01/10 0536) BP: (105-141)/(72-85) 105/73 (01/10 0536) SpO2:  [96 %-100 %] 96 % (01/10 0536) Weight:  [72.1 kg] 72.1 kg (01/09 1501) Last BM Date: 03/21/20 General:   Alert,  Well-developed, well-nourished, pleasant and cooperative in NAD Head:  Normocephalic and atraumatic. Eyes:  Sclera clear, no icterus.   Conjunctiva pink. Ears:  Normal auditory acuity. Nose:  No deformity, discharge,  or lesions. Mouth:  No deformity or lesions, dentition normal. Lungs:  Clear throughout to auscultation.    Heart:  S1 S2 present without murmurs Abdomen:  Soft, moderately TTP epigastric and LUQ and nondistended. No masses, hepatosplenomegaly. Normal bowel sounds. Well-healed midline incision Rectal:  Deferred   Msk:  Symmetrical without gross deformities. Normal posture. Extremities:  Without  edema. Neurologic:  Alert and  oriented x4 Psych:  Alert and cooperative. Normal mood and affect.  Intake/Output from previous day: 01/09 0701 - 01/10  0700 In: 1240 [P.O.:240; IV Piggyback:1000] Out: 251 [Urine:250; Stool:1] Intake/Output this shift: No intake/output data recorded.  Lab Results: Recent Labs    03/20/20 0715 03/21/20 0745 03/22/20 0447  WBC 5.4 14.1* 4.9  HGB 15.6 16.8 13.1  HCT 46.4 49.8 40.3  PLT 197 218 179   BMET Recent Labs    03/20/20 0715 03/21/20 0745 03/22/20 0447  NA 138 143 141  K 3.5 4.6 3.5  CL 105 106 109  CO2 23 25 25   GLUCOSE 123* 121* 100*  BUN 10 16 11   CREATININE 0.88 0.92 0.82  CALCIUM 9.3 10.0 8.4*   LFT Recent Labs    03/20/20 0715 03/21/20 0745  PROT 7.9 8.5*  ALBUMIN 4.4 4.8  AST 24 29  ALT 36 54*  ALKPHOS 97 113  BILITOT 1.0 1.2   Lab Results  Component Value Date   LIPASE 30 03/22/2020     Studies/Results: CT abd/pelvis with contrast 03/20/20 IMPRESSION: 1. Thickening of the walls of the distal stomach and duodenal bulb, similar to previous CT abdomen of 11/27/2018, suggesting recurrent or chronic gastritis/duodenitis. Consider endoscopic evaluation to ensure benignity. 2. No bowel obstruction. No evidence of acute pancreatitis. No free fluid or abscess collection. No hydronephrosis or ureteral calculi. 3. Anterior abdominal wall hernia which contains a focal segment of the small bowel but no obstruction or inflammation at the hernia site. 4. Colonic diverticulosis without evidence of acute diverticulitis. 5. Chronic intrahepatic bile duct dilatation, as previously described. Stable appearance of the atrophic pancreatic body and tail with associated pancreatic duct dilatation.  Configuration of the duodenum and pancreatic head again suggests associated annular pancreas, stable. 6. Upper abdominal and paraesophageal varices. 7. Bilateral nephrolithiasis.      Impression: 65 year old male with history of annular pancreas, chronic pancreatitis felt secondary to ETOH, prior episode of necrotizing pancreatis in the remote past undergoing endoscopic  necrosectomy in April 2014 with 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  Lipase only marginally elevated at 56 and not indicative of acute pancreatitis, now 30 today, and CT on 1/8 without obvious pancreatitis but did note thickening of distal stomach walls and duodenal bulb, which was also seen on outside CT [facility]) in Sept 2020.   Followed by [facility] and well known to them. Since last visit in Sept 2020, he had been doing well on pancreatic enzymes; however, he endorses veering from low-fat diet and frequent small meals to eating more generously and without caution. No alcohol since 2008. Chronic GERD on PPI. Denies NSAIDs except 81 mg aspirin daily listed.  Unable to exclude mild bout of pancreatitis but at this point needs diagnostic EGD due to persistent findings on CT of thickened distal stomach and duodenal bulb. He reports no dedicated EGD historically, only ERCPs (around 2014). Known annular pancreas. 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    Diarrhea: resolved. Doubt infectious process.    Plan: Remain NPO Proceed with EGD with Propofol by Dr. Marletta Lor. Discussed risks and benefits with stated understanding Continue IV PPI BID Continue IV hydration Supportive measures [facility] follow-up for dedicated pancreas care Resume Creon 36,000 units taking 3 with meals and 2 with snacks when diet advanced   Gelene Mink, PhD, ANP-BC [facility]     LOS: 0 days    03/22/2020, 12:13 PM

## 2020-03-22 NOTE — Care Management Obs Status (Signed)
MEDICARE OBSERVATION STATUS NOTIFICATION   Patient Details  Name: Justin [name] MRN: [id number] Date of Birth: 1955/07/19   Medicare Observation Status Notification Given:  Yes    Corey Harold 03/22/2020, 1:21 PM

## 2020-03-22 NOTE — Transfer of Care (Signed)
Immediate Anesthesia Transfer of Care Note  Patient: Justin [name]  Procedure(s) Performed: ESOPHAGOGASTRODUODENOSCOPY (EGD) WITH PROPOFOL (N/A ) BIOPSY  Patient Location: PACU  Anesthesia Type:General  Level of Consciousness: awake and alert   Airway & Oxygen Therapy: Patient Spontanous Breathing  Post-op Assessment: Report given to RN and Post -op Vital signs reviewed and stable  Post vital signs: Reviewed and stable  Last Vitals:  Vitals Value Taken Time  BP    Temp    Pulse    Resp    SpO2      Last Pain:  Vitals:   03/22/20 1427  TempSrc:   PainSc: 0-No pain      Patients Stated Pain Goal: 8 (03/22/20 1257)  Complications: No complications documented.

## 2020-03-22 NOTE — Interval H&P Note (Signed)
History and Physical Interval Note:  03/22/2020 2:27 PM  Justin [name]  has presented today for surgery, with the diagnosis of gastric and duodenal wall thickening.  The various methods of treatment have been discussed with the patient and family. After consideration of risks, benefits and other options for treatment, the patient has consented to  Procedure(s): ESOPHAGOGASTRODUODENOSCOPY (EGD) WITH PROPOFOL (N/A) as a surgical intervention.  [name] history has been reviewed, patient examined, no change in status, stable for surgery.  I have reviewed the patient's chart and [name].  Questions were answered to the patient's satisfaction.     Lanelle Bal

## 2020-03-22 NOTE — Anesthesia Postprocedure Evaluation (Signed)
Anesthesia Post Note  Patient: Justin [name]  Procedure(s) Performed: ESOPHAGOGASTRODUODENOSCOPY (EGD) WITH PROPOFOL (N/A ) BIOPSY  Patient location during evaluation: Phase II Anesthesia Type: General Level of consciousness: awake and oriented Pain management: satisfactory to patient Vital Signs Assessment: post-procedure vital signs reviewed and stable Respiratory status: spontaneous breathing Cardiovascular status: blood pressure returned to baseline and stable Postop Assessment: no apparent nausea or vomiting Anesthetic complications: no   No complications documented.   Last Vitals:  Vitals:   03/22/20 1256 03/22/20 1257  BP:  121/73  Pulse: 60   Resp: 17   Temp: 36.6 C   SpO2: 98% 98%    Last Pain:  Vitals:   03/22/20 1427  TempSrc:   PainSc: 0-No pain                 Lorin Glass

## 2020-03-22 NOTE — Anesthesia Preprocedure Evaluation (Signed)
Anesthesia Evaluation  Patient identified by MRN, date of birth, ID band Patient awake    Reviewed: Allergy & Precautions, NPO status , Patient's Chart, lab work & pertinent test results  Airway Mallampati: II  TM Distance: >3 FB Neck ROM: Full    Dental  (+) Dental Advisory Given, Missing, Chipped   Pulmonary asthma , pneumonia, former smoker,    Pulmonary exam normal breath sounds clear to auscultation       Cardiovascular Exercise Tolerance: Good hypertension, Pt. on medications + CAD and + Past MI  Normal cardiovascular exam Rhythm:Regular Rate:Normal     Neuro/Psych PSYCHIATRIC DISORDERS Depression  Neuromuscular disease CVA, Residual Symptoms    GI/Hepatic GERD  Medicated and Controlled,(+)     substance abuse  alcohol use and marijuana use, Abnormal LFTs   Endo/Other  Hypothyroidism   Renal/GU      Musculoskeletal  (+) Fibromyalgia -Back sx   Abdominal   Peds  Hematology  (+) anemia ,   Anesthesia Other Findings   Reproductive/Obstetrics                            Anesthesia Physical Anesthesia Plan  ASA: III  Anesthesia Plan: General   Post-op Pain Management:    Induction: Intravenous  PONV Risk Score and Plan: TIVA  Airway Management Planned: Nasal Cannula, Natural Airway and Simple Face Mask  Additional Equipment:   Intra-op Plan:   Post-operative Plan:   Informed Consent: I have reviewed the patients History and Physical, chart, labs and discussed the procedure including the risks, benefits and alternatives for the proposed anesthesia with the patient or authorized representative who has indicated his/her understanding and acceptance.     Dental advisory given  Plan Discussed with: CRNA and Surgeon  Anesthesia Plan Comments:         Anesthesia Quick Evaluation

## 2020-03-23 DIAGNOSIS — K2970 Gastritis, unspecified, without bleeding: Secondary | ICD-10-CM | POA: Diagnosis not present

## 2020-03-23 DIAGNOSIS — R109 Unspecified abdominal pain: Secondary | ICD-10-CM | POA: Diagnosis not present

## 2020-03-23 NOTE — Progress Notes (Signed)
PROGRESS NOTE    Justin [name]  NLG:[id number] DOB: 1955/04/29 DOA: 03/21/2020 PCP: Justin Specking, MD   Chief Complaint  Patient presents with  . Abdominal Pain    Brief Narrative:  As per H&P written by Dr. Kerry [name] on 03/21/2020 Justin [name] is a 65 y.o. male with medical history significant of chronic pancreatitis, GERD, hypertension, presents to the hospital with complaints of abdominal pain, nausea, vomiting, diarrhea.  He reports that he has had the symptoms for the past 3 days.  They have progressively gotten worse.  He came to the ER yesterday for evaluation.  At that time he had a CT abdomen done that did show some gastritis versus duodenitis.  [name] were relatively unrevealing.  He was treated symptomatically and discharged home.  He returned to the hospital today due to recurrence/worsening of symptoms.  He reports that he has been unable to keep anything down.  Vomitus is dark green in color.  He is thrown up multiple times.  He is also having several loose stools which are dark green in color as well.  He reports being febrile over the past 3 days.  Symptoms did start after he had chicken wings.  No other sick contacts.  He feels that his discomfort is typical of his pancreatitis pain.  ED Course: Upon arrival to the emergency room, he was noted to be mildly dehydrated, tachycardic.  He was started on IV fluids with resolution of tachycardia.  [name] are relatively unrevealing, lipase is only minimally elevated.  He continued to vomit in the emergency room.  He has been referred for admission.  Assessment & Plan: 1-abdominal pain, nausea/vomiting and diarrhea: Appears to be secondary to acute on chronic pancreatitis. -Continue supportive care, IV fluids, antiemetics and as needed analgesic -continue Slowly advance diet as tolerated -Resume the use of creon as instructed by GI.  2-gastritis -Appreciate assistance and endoscopic evaluation by GI service -Continue PPI twice a day for  8 weeks -Avoid the use of NSAIDs. -No active bleeding or open ulcers appreciated on examination.  Normal duodenal bulb. -Continue to advance diet as tolerated -Outpatient follow-up with [facility] for further pancreatic care.  3-Hypothyroidism -Continue Synthroid.  4-dehydration -In the setting of GI losses and poor oral intake -Continue the use of IV fluids and supportive care -Patient encouraged to maintain adequate oral hydration. -Continue slowly advancing diet.  5-vomiting/diarrhea -As mentioned above most likely in the setting of acute on chronic pancreatitis -No further fevers and no further diarrhea since admission -Will stop antibiotics and assess clinical progression. -Continue to maintain adequate hydration, as needed antiemetics and continue the use of Creon.  6-history of BPH -Continue Flomax.  7-depression/anxiety -No suicidal ideation or hallucination -Continue as needed clonazepam and Cymbalta.  DVT prophylaxis: SCDs. Code Status: Full code. Family Communication: No family at bedside. Disposition:   Status is: Observation  Dispo: The patient is from: home              Anticipated d/c is to: Home              Anticipated d/c date is: 1-2 days.              Patient currently no medically ready for discharge.  Still reporting intermittent nausea and having abdominal pain.  Status post EGD that demonstrated no peptic ulcer, no esophageal or duodenal bulb abnormalities.  Positive gastritis.  NSAIDs will be encourage not to be used.  Continue PPI twice a day for a  total of 8 weeks.  Continue slowly advance diet as tolerated, continue supportive care, antiemetics, analgesics and the use of Creon.      Consultants:   GI service   Procedures:  Endoscopy: Demonstrating gastritis; no active bleeding or abnormalities in his esophagus.  Normal duodenal bulb appreciated.  Antimicrobials:  Cipro and Flagyl stopped on 03/23/20  Subjective: No fever, no chest pain, no  shortness of breath.  Still reporting ongoing abdominal discomfort and intermittent nausea.  Sprays no further diarrhea or vomiting. Objective: Vitals:   03/22/20 1456 03/22/20 1500 03/22/20 2101 03/23/20 0540  BP: 109/61 119/75 116/65 113/74  Pulse:  63 71 (!) 54  Resp:  18 17 16   Temp:  98 F (36.7 C) 98.5 F (36.9 C) 98.4 F (36.9 C)  TempSrc:  Oral Oral   SpO2:  97% 97% 96%  Weight:      Height:        Intake/Output Summary (Last 24 hours) at 03/23/2020 1300 Last data filed at 03/23/2020 0900 Gross per 24 hour  Intake 2125 ml  Output 0 ml  Net 2125 ml   Filed Weights   03/21/20 0531 03/21/20 1501  Weight: 70.4 kg 72.1 kg    Examination: General exam: Alert, awake, oriented x 3; afebrile, still reporting mild intermittent nausea; no further vomiting.  Express no diarrhea.  Patient has remained afebrile.  Still having some abdominal pain requiring IV analgesics.  Will like to have diet further advanced. Respiratory system: Clear to auscultation. Respiratory effort normal.  No using accessory muscles. Cardiovascular system:RRR. No murmurs, rubs, gallops.  No JVD. Gastrointestinal system: Abdomen is nondistended, soft and mildly tender to deep palpation in midepigastric region and mid abdominal area.  No guarding. No organomegaly or masses felt. Normal bowel sounds heard. Central nervous system: Alert and oriented. No focal neurological deficits. Extremities: No cyanosis or clubbing. Skin: No rashes, no petechiae. Psychiatry: Judgement and insight appear normal. Mood & affect appropriate.    Data Reviewed: I have personally reviewed following [name] and imaging studies  CBC: Recent [name]  Lab 03/20/20 0715 03/21/20 0745 03/22/20 0447  WBC 5.4 14.1* 4.9  NEUTROABS  --  12.7*  --   HGB 15.6 16.8 13.1  HCT 46.4 49.8 40.3  MCV 87.2 87.5 89.0  PLT 197 218 179    Basic Metabolic Panel: Recent [name]  Lab 03/20/20 0715 03/21/20 0745 03/22/20 0447  NA 138 143 141  K  3.5 4.6 3.5  CL 105 106 109  CO2 23 25 25   GLUCOSE 123* 121* 100*  BUN 10 16 11   CREATININE 0.88 0.92 0.82  CALCIUM 9.3 10.0 8.4*    GFR: Estimated Creatinine Clearance: 85.1 mL/min (by C-G formula based on SCr of 0.82 mg/dL).  Liver Function Tests: Recent [name]  Lab 03/20/20 0715 03/21/20 0745  AST 24 29  ALT 36 54*  ALKPHOS 97 113  BILITOT 1.0 1.2  PROT 7.9 8.5*  ALBUMIN 4.4 4.8    CBG: No results for input(s): GLUCAP in the last 168 hours.   Recent Results (from the past 240 hour(s))  Resp Panel by RT-PCR (Flu A&B, Covid) Nasopharyngeal Swab     Status: None   Collection Time: 03/21/20  7:46 AM   Specimen: Nasopharyngeal Swab; Nasopharyngeal(NP) swabs in vial transport medium  Result Value Ref Range Status   SARS Coronavirus 2 by RT PCR NEGATIVE NEGATIVE Final    Comment: (NOTE) SARS-CoV-2 target nucleic acids are NOT DETECTED.  The SARS-CoV-2 RNA is generally  detectable in upper respiratory specimens during the acute phase of infection. The lowest concentration of SARS-CoV-2 viral copies this assay can detect is 138 copies/mL. A negative result does not preclude SARS-Cov-2 infection and should not be used as the sole basis for treatment or other patient management decisions. A negative result may occur with  improper specimen collection/handling, submission of specimen other than nasopharyngeal swab, presence of viral mutation(s) within the areas targeted by this assay, and inadequate number of viral copies(<138 copies/mL). A negative result must be combined with clinical observations, patient history, and epidemiological information. The expected result is Negative.  Fact Sheet for Patients:  BloggerCourse.com  Fact Sheet for Healthcare Providers:  SeriousBroker.it  This test is no t yet approved or cleared by the [place] FDA and  has been authorized for detection and/or diagnosis of SARS-CoV-2  by FDA under an Emergency Use Authorization (EUA). This EUA will remain  in effect (meaning this test can be used) for the duration of the COVID-19 declaration under Section 564(b)(1) of the Act, 21 U.S.C.section 360bbb-3(b)(1), unless the authorization is terminated  or revoked sooner.       Influenza A by PCR NEGATIVE NEGATIVE Final   Influenza B by PCR NEGATIVE NEGATIVE Final    Comment: (NOTE) The Xpert Xpress SARS-CoV-2/FLU/RSV plus assay is intended as an aid in the diagnosis of influenza from Nasopharyngeal swab specimens and should not be used as a sole basis for treatment. Nasal washings and aspirates are unacceptable for Xpert Xpress SARS-CoV-2/FLU/RSV testing.  Fact Sheet for Patients: BloggerCourse.com  Fact Sheet for Healthcare Providers: SeriousBroker.it  This test is not yet approved or cleared by the [place] FDA and has been authorized for detection and/or diagnosis of SARS-CoV-2 by FDA under an Emergency Use Authorization (EUA). This EUA will remain in effect (meaning this test can be used) for the duration of the COVID-19 declaration under Section 564(b)(1) of the Act, 21 U.S.C. section 360bbb-3(b)(1), unless the authorization is terminated or revoked.  Performed at [facility], [address] [place], [place] [address]      Radiology Studies: No results found.   Scheduled Meds: . aspirin  81 mg Oral Daily  . clonazePAM  0.5 mg Oral QHS  . DULoxetine  60 mg Oral q morning - 10a  . gabapentin  600 mg Oral TID  . levothyroxine  112 mcg Oral q morning - 10a  . lipase/protease/amylase  36,000 Units Oral TID with meals  . pantoprazole  40 mg Oral BID  . tamsulosin  0.4 mg Oral BID   Continuous Infusions: . lactated ringers 75 mL/hr at 03/22/20 1514     LOS: 1 day    Time spent: 30 minutes.    Vassie Loll, MD [facility]   To contact the attending provider between 7A-7P or the  covering provider during after hours 7P-7A, please log into the web site [email or web address] and access using universal [place] password for that web site. If you do not have the password, please call the hospital operator.  03/23/2020, 1:00 PM

## 2020-03-23 NOTE — Progress Notes (Signed)
Pt stated the pain medication given at 2214 did not help at all. Pt reports stabbing pain to abdomen. See MAR for PRN pain administration

## 2020-03-23 NOTE — Progress Notes (Addendum)
Subjective: Abdominal pain continues. Pain is wrapping from mid upper abdomen around left flank and into his back. Pain is constant and dull. Had been experiencing frequent sharp stabbing pains and this seems to be improving. States it isn't occurring as frequently. Abdominal pain is about 8/10. Last received pain medications around 6:40 am (about 2.5 hours ago). Tolerating clear liquids well. No worsening of abdominal pain with this. No nausea or vomiting since yesterday morning. Last BM was on 1/9. Diarrhea started between 1/8-1/9 with total of 3 watery BMs. Passing gas at this time.   Patient states his symptoms are similar to prior mild flares of his pancreatitis. States usually he will get admitted for a few days, stays on clear liquids, received pain medications, and improves.   Didn't sleep well last night. Found out that he has 3 grandchildren and daughter in law have COVID. They are in [place].   Objective: Vital signs in last 24 hours: Temp:  [97.8 F (36.6 C)-98.5 F (36.9 C)] 98.4 F (36.9 C) (01/11 0540) Pulse Rate:  [54-75] 54 (01/11 0540) Resp:  [14-18] 16 (01/11 0540) BP: (84-121)/(5-75) 113/74 (01/11 0540) SpO2:  [65 %-98 %] 96 % (01/11 0540) Last BM Date: 03/21/20 General:   Alert and oriented, pleasant, NAD Head:  Normocephalic and atraumatic. Eyes:  No icterus, sclera clear. Conjuctiva pink.   Abdomen:  Bowel sounds present. Abdomen is soft and non-distended. Mild TTP in epigastric area, moderate TTP in LUQ, mild abdominal pain in LLQ, minimal TTP in left flank. Midline vertical scar with palpable reducible hernia. No rebound or guarding. . Extremities:  Without edema. Neurologic:  Alert and  oriented x4;  grossly normal neurologically. Psych: Normal mood and affect.  Intake/Output from previous day: 01/10 0701 - 01/11 0700 In: 1525 [P.O.:480; I.V.:1045] Out: 0  Intake/Output this shift: No intake/output data recorded.  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 Lipase slightly elevated at 56, not indicative of acute pancreatitis, and normalized to 30 on 1/10. CT on 1/8 without obvious pancreatitis but did note thickening of distal stomach walls and duodenal bulb, which was also seen on outside [facility]) in Sept 2020. Last seen by [facility] in September 2020 and had been doing well until this acute episode. Notably, he endorses veering from low-fat diet and frequent small meals to eating more generously and without caution. No alcohol since 2008. Chronic GERD on PPI. Denies NSAIDs except 81 mg aspirin daily listed.  Underwent EGD 1/10 with normal-appearing esophagus, gastritis s/p biopsy, normal examined duodenum.  Patient has  had slight clinical improvement today in his abdominal pain. Nausea/vomiting/diarrhea have resolved. Suspect pain is likely multifactorial in the setting of gastritis and chronic pancreatitis likely flared by dietary indiscretion. May have also had an acute vital gastroenteritis with report of N/V/diarrhea that resolved within 24 hours.   Plan: Continue clear liquids today.  Continue PPI  BID Continue IV hydration.  Resume Creon 36,000 units taking 3 with meals and 2 with snacks when diet advanced Follow-up on surgical pathology.  [facility] follow-up for dedicated pancreas care. Patient states his wife is reaching out to [facility].  Continue supportive measures/pain management per hospitalitis.    LOS: 1 day    03/23/2020, 7:56 AM   Ermalinda Memos, PA-C [facility]

## 2020-03-24 ENCOUNTER — Other Ambulatory Visit: Payer: Self-pay

## 2020-03-24 DIAGNOSIS — R109 Unspecified abdominal pain: Secondary | ICD-10-CM | POA: Diagnosis not present

## 2020-03-24 DIAGNOSIS — K2970 Gastritis, unspecified, without bleeding: Secondary | ICD-10-CM | POA: Diagnosis not present

## 2020-03-24 DIAGNOSIS — K859 Acute pancreatitis without necrosis or infection, unspecified: Secondary | ICD-10-CM

## 2020-03-24 DIAGNOSIS — K861 Other chronic pancreatitis: Secondary | ICD-10-CM

## 2020-03-24 LAB — HEPATIC FUNCTION PANEL
ALT: 33 U/L (ref 0–44)
AST: 14 U/L — ABNORMAL LOW (ref 15–41)
Albumin: 3.2 g/dL — ABNORMAL LOW (ref 3.5–5.0)
Alkaline Phosphatase: 82 U/L (ref 38–126)
Bilirubin, Direct: 0.1 mg/dL (ref 0.0–0.2)
Indirect Bilirubin: 0.5 mg/dL (ref 0.3–0.9)
Total Bilirubin: 0.6 mg/dL (ref 0.3–1.2)
Total Protein: 5.8 g/dL — ABNORMAL LOW (ref 6.5–8.1)

## 2020-03-24 MED ORDER — OXYCODONE HCL 5 MG PO TABS: 5.0000 mg | ORAL_TABLET | Freq: Four times a day (QID) | ORAL | Status: DC | PRN

## 2020-03-24 NOTE — Progress Notes (Signed)
PROGRESS NOTE    Justin [name]  LOV:[id number] DOB: 29-Dec-1955 DOA: 03/21/2020 PCP: [name], MD   Chief Complaint  Patient presents with  . Abdominal Pain    Brief Narrative:  As per H&P written by Dr. Kerry Hough on 03/21/2020 Justin [name] is a 65 y.o. male with medical history significant of chronic pancreatitis, GERD, hypertension, presents to the hospital with complaints of abdominal pain, nausea, vomiting, diarrhea.  He reports that he has had the symptoms for the past 3 days.  They have progressively gotten worse.  He came to the ER yesterday for evaluation.  At that time he had a CT abdomen done that did show some gastritis versus duodenitis.  [name] were relatively unrevealing.  He was treated symptomatically and discharged home.  He returned to the hospital today due to recurrence/worsening of symptoms.  He reports that he has been unable to keep anything down.  Vomitus is dark green in color.  He is thrown up multiple times.  He is also having several loose stools which are dark green in color as well.  He reports being febrile over the past 3 days.  Symptoms did start after he had chicken wings.  No other sick contacts.  He feels that his discomfort is typical of his pancreatitis pain.  ED Course: Upon arrival to the emergency room, he was noted to be mildly dehydrated, tachycardic.  He was started on IV fluids with resolution of tachycardia.  [name] are relatively unrevealing, lipase is only minimally elevated.  He continued to vomit in the emergency room.  He has been referred for admission.  Assessment & Plan: 1-abdominal pain, nausea/vomiting and diarrhea: Appears to be secondary to acute on chronic pancreatitis. -Continue supportive care, IV fluids, antiemetics and as needed analgesic -Starting oral pain medications and follow response. -continue Slowly advance diet as tolerated; will advance to soft low-fat diet. -Resume the use of creon as instructed by  GI.  2-gastritis -Appreciate assistance and endoscopic evaluation by GI service -Continue PPI twice a day for 8 weeks -Avoid the use of NSAIDs. -No active bleeding or open ulcers appreciated on examination.  Normal duodenal bulb. -Continue to advance diet as tolerated -Outpatient follow-up with [facility] for further pancreatic care.  3-Hypothyroidism -Continue Synthroid.  4-dehydration -In the setting of GI losses and poor oral intake -Continue the use of IV fluids and supportive care -Patient encouraged to maintain adequate oral hydration. -Continue slowly advancing diet.  5-vomiting/diarrhea -As mentioned above most likely in the setting of acute on chronic pancreatitis -No further fevers and no further diarrhea since admission -Will stop antibiotics and discontinue enteric precautions. -Continue to maintain adequate hydration, as needed antiemetics and continue the use of Creon.  6-history of BPH -Continue Flomax.  7-depression/anxiety -No suicidal ideation or hallucination -Continue as needed clonazepam and Cymbalta.  DVT prophylaxis: SCDs. Code Status: Full code. Family Communication: No family at bedside. Disposition:   Status is: Observation  Dispo: The patient is from: home              Anticipated d/c is to: Home              Anticipated d/c date is: 1 day.              Patient currently no medically ready for discharge.  Still reporting intermittent nausea and having abdominal pain.  Status post EGD that demonstrated no peptic ulcer, no esophageal or duodenal bulb abnormalities.  Positive gastritis.  NSAIDs will be  encourage not to be used.  Continue PPI twice a day for a total of 8 weeks.  Continue slowly advance diet as tolerated, continue supportive care, antiemetics, analgesics and the use of Creon.      Consultants:   GI service   Procedures:  Endoscopy: Demonstrating gastritis; no active bleeding or abnormalities in his esophagus.  Normal duodenal bulb  appreciated.  Antimicrobials:  Cipro and Flagyl stopped on 03/23/20  Subjective: No fever, no chest pain, no nausea, no vomiting.  Still having intermittent episode of abdominal pain radiated to his back and is requiring IV analgesics.  Patient tolerated full liquid diet yesterday (03/23/20).  Objective: Vitals:   03/22/20 2101 03/23/20 0540 03/23/20 1335 03/24/20 0453  BP: 116/65 113/74 124/69 105/67  Pulse: 71 (!) 54 66 66  Resp: 17 16 20 16   Temp: 98.5 F (36.9 C) 98.4 F (36.9 C) 98.4 F (36.9 C) 98.5 F (36.9 C)  TempSrc: Oral  Oral Oral  SpO2: 97% 96% 95% 95%  Weight:      Height:        Intake/Output Summary (Last 24 hours) at 03/24/2020 1200 Last data filed at 03/24/2020 0300 Gross per 24 hour  Intake 2038.6 ml  Output --  Net 2038.6 ml   Filed Weights   03/21/20 0531 03/21/20 1501  Weight: 70.4 kg 72.1 kg    Examination: General exam: Alert, awake, oriented x 3; no fever, no chest pain, no shortness of breath.  Patient reports no further vomiting and currently not nauseous.  Still having ongoing abdominal pain requiring IV analgesics.  Express good tolerance to full liquid diet. Respiratory system: Clear to auscultation. Respiratory effort normal.  No using accessory muscles; no requiring supplementation. Cardiovascular system:RRR. No murmurs, rubs, gallops.  No JVD. Gastrointestinal system: Abdomen is nondistended, soft and no guarding appreciated; patient expressed midepigastric pain intermittently present and radiated to his back.   Central nervous system: No focal neurological deficits. Extremities: No cyanosis or clubbing. Skin: No rashes, no petechiae. Psychiatry: Judgement and insight appear normal. Mood & affect appropriate.    Data Reviewed: I have personally reviewed following [name] and imaging studies  CBC: Recent [name]  Lab 03/20/20 0715 03/21/20 0745 03/22/20 0447  WBC 5.4 14.1* 4.9  NEUTROABS  --  12.7*  --   HGB 15.6 16.8 13.1  HCT 46.4  49.8 40.3  MCV 87.2 87.5 89.0  PLT 197 218 179    Basic Metabolic Panel: Recent [name]  Lab 03/20/20 0715 03/21/20 0745 03/22/20 0447  NA 138 143 141  K 3.5 4.6 3.5  CL 105 106 109  CO2 23 25 25   GLUCOSE 123* 121* 100*  BUN 10 16 11   CREATININE 0.88 0.92 0.82  CALCIUM 9.3 10.0 8.4*    GFR: Estimated Creatinine Clearance: 85.1 mL/min (by C-G formula based on SCr of 0.82 mg/dL).  Liver Function Tests: Recent [name]  Lab 03/20/20 0715 03/21/20 0745  AST 24 29  ALT 36 54*  ALKPHOS 97 113  BILITOT 1.0 1.2  PROT 7.9 8.5*  ALBUMIN 4.4 4.8    CBG: No results for input(s): GLUCAP in the last 168 hours.   Recent Results (from the past 240 hour(s))  Resp Panel by RT-PCR (Flu A&B, Covid) Nasopharyngeal Swab     Status: None   Collection Time: 03/21/20  7:46 AM   Specimen: Nasopharyngeal Swab; Nasopharyngeal(NP) swabs in vial transport medium  Result Value Ref Range Status   SARS Coronavirus 2 by RT PCR NEGATIVE NEGATIVE Final  Comment: (NOTE) SARS-CoV-2 target nucleic acids are NOT DETECTED.  The SARS-CoV-2 RNA is generally detectable in upper respiratory specimens during the acute phase of infection. The lowest concentration of SARS-CoV-2 viral copies this assay can detect is 138 copies/mL. A negative result does not preclude SARS-Cov-2 infection and should not be used as the sole basis for treatment or other patient management decisions. A negative result may occur with  improper specimen collection/handling, submission of specimen other than nasopharyngeal swab, presence of viral mutation(s) within the areas targeted by this assay, and inadequate number of viral copies(<138 copies/mL). A negative result must be combined with clinical observations, patient history, and epidemiological information. The expected result is Negative.  Fact Sheet for Patients:  BloggerCourse.com  Fact Sheet for Healthcare Providers:   SeriousBroker.it  This test is no t yet approved or cleared by the [place] FDA and  has been authorized for detection and/or diagnosis of SARS-CoV-2 by FDA under an Emergency Use Authorization (EUA). This EUA will remain  in effect (meaning this test can be used) for the duration of the COVID-19 declaration under Section 564(b)(1) of the Act, 21 U.S.C.section 360bbb-3(b)(1), unless the authorization is terminated  or revoked sooner.       Influenza A by PCR NEGATIVE NEGATIVE Final   Influenza B by PCR NEGATIVE NEGATIVE Final    Comment: (NOTE) The Xpert Xpress SARS-CoV-2/FLU/RSV plus assay is intended as an aid in the diagnosis of influenza from Nasopharyngeal swab specimens and should not be used as a sole basis for treatment. Nasal washings and aspirates are unacceptable for Xpert Xpress SARS-CoV-2/FLU/RSV testing.  Fact Sheet for Patients: BloggerCourse.com  Fact Sheet for Healthcare Providers: SeriousBroker.it  This test is not yet approved or cleared by the [place] FDA and has been authorized for detection and/or diagnosis of SARS-CoV-2 by FDA under an Emergency Use Authorization (EUA). This EUA will remain in effect (meaning this test can be used) for the duration of the COVID-19 declaration under Section 564(b)(1) of the Act, 21 U.S.C. section 360bbb-3(b)(1), unless the authorization is terminated or revoked.  Performed at [facility], [address], [place], [place] [address]      Radiology Studies: No results found.   Scheduled Meds: . aspirin  81 mg Oral Daily  . clonazePAM  0.5 mg Oral QHS  . DULoxetine  60 mg Oral q morning - 10a  . gabapentin  600 mg Oral TID  . levothyroxine  112 mcg Oral q morning - 10a  . lipase/protease/amylase  36,000 Units Oral TID with meals  . pantoprazole  40 mg Oral BID  . tamsulosin  0.4 mg Oral BID   Continuous Infusions: . lactated  ringers 75 mL/hr at 03/24/20 0158     LOS: 2 days    Time spent: 30 minutes.    Vassie Loll, MD [facility]   To contact the attending provider between 7A-7P or the covering provider during after hours 7P-7A, please log into the web site [email or web address] and access using universal  password for that web site. If you do not have the password, please call the hospital operator.  03/24/2020, 12:00 PM

## 2020-03-24 NOTE — Progress Notes (Signed)
Subjective: Continues to improve slowly.  States his abdominal pain is less generalized and less severe.  Pain seems to be more focal in the left upper quadrant.  Stabbing pains are much less.  Tolerated full liquids well yesterday with no worsening of abdominal pain.  No nausea or vomiting.  Has not had a bowel movement yet, but feels he may need to have one now.  He continues to pass gas.  Receiving pain medications about every 3 hours.  Objective: Vital signs in last 24 hours: Temp:  [98.5 F (36.9 C)] 98.5 F (36.9 C) (01/12 0453) Pulse Rate:  [66] 66 (01/12 0453) Resp:  [16] 16 (01/12 0453) BP: (105)/(67) 105/67 (01/12 0453) SpO2:  [95 %] 95 % (01/12 0453) Last BM Date: 03/21/20 General:   Alert and oriented, pleasant, resting comfortably in bed, NAD. Head:  Normocephalic and atraumatic. Eyes:  No icterus, sclera clear. Conjuctiva pink. .  Abdomen:  Bowel sounds present.  Abdomen is nondistended and soft.  Mild TTP in epigastric and LUQ region.  Minimal TTP in LLQ.  Midline vertical scar with palpable reducible hernia.  No rebound or guarding. Extremities:  Without edema. Neurologic:  Alert and  oriented x4;  grossly normal neurologically. Skin:  Warm and dry, intact without significant lesions.  Psych: Normal mood and affect.  Intake/Output from previous day: 01/11 0701 - 01/12 0700 In: 2638.6 [P.O.:840; I.V.:1798.6] Out: -  Intake/Output this shift: No intake/output data recorded.  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  Last seen by [facility] in September 2020 and had been doing well until this acute episode. Notably, he endorses veering from low-fat diet and frequent small meals to eating more generously and without caution. No alcohol since 2008. Chronic GERD on PPI. Denies NSAIDs except 81 mg aspirin daily listed.   He was found to have Lipase slightly elevated at 56, not indicative of acute pancreatitis, and normalized to 30 on 1/10. CT on 1/8 without obvious pancreatitis but did note thickening of distal stomach walls and duodenal bulb, which was also seen on outside CT [facility]) in Sept 2020.ALT slightly elevated at 54 on 1/9. EGD 1/10 with normal-appearing esophagus, gastritis s/p biopsy, normal examined duodenum.  Clinically, patient is improving slowly.  Nausea, vomiting, diarrhea resolved within 24 hours, and his abdominal pain continues to improve slowly. He has tolerated full liquids with no worsening abdominal pain. Suspect pain is likely multifactorial in the setting of gastritis and chronic pancreatitis likely flared by dietary indiscretion. May have also had an acute vital gastroenteritis with report of N/V/diarrhea that resolved within 24 hours. Agree with discontinuing empiric antibiotics at this point. As ALT was slightly elevated on 1/9, will plan to repeat HFP today to ensure stability/normalization.   Plan: 1.  HFP today 2.  Discontinue empiric antibiotics.  3.  Advance to  soft, low fat diet today. 4.  Continue PPI twice daily. 5.  Continue Creon 36,000 units taking 3 with meals and 2 with snacks when diet advanced 6.  Follow-up on surgical pathology.  7.  [facility] follow-up for dedicated pancreas care. Patient states his wife is reaching out to [facility].  8.  Continue supportive measures/pain  management per hospitalitis.              -Recommend switching to po pain medication.    LOS: 2 days    03/24/2020, 2:20 PM   Ermalinda Memos, [facility]

## 2020-03-25 ENCOUNTER — Encounter (HOSPITAL_COMMUNITY): Payer: Self-pay | Admitting: Internal Medicine

## 2020-03-25 DIAGNOSIS — R1110 Vomiting, unspecified: Secondary | ICD-10-CM

## 2020-03-25 DIAGNOSIS — K861 Other chronic pancreatitis: Secondary | ICD-10-CM | POA: Diagnosis not present

## 2020-03-25 DIAGNOSIS — K2970 Gastritis, unspecified, without bleeding: Secondary | ICD-10-CM

## 2020-03-25 DIAGNOSIS — E860 Dehydration: Secondary | ICD-10-CM | POA: Diagnosis not present

## 2020-03-25 DIAGNOSIS — K859 Acute pancreatitis without necrosis or infection, unspecified: Principal | ICD-10-CM

## 2020-03-25 DIAGNOSIS — R109 Unspecified abdominal pain: Secondary | ICD-10-CM | POA: Diagnosis not present

## 2020-03-25 LAB — BASIC METABOLIC PANEL
Anion gap: 7 (ref 5–15)
BUN: 6 mg/dL — ABNORMAL LOW (ref 8–23)
CO2: 27 mmol/L (ref 22–32)
Calcium: 8.5 mg/dL — ABNORMAL LOW (ref 8.9–10.3)
Chloride: 108 mmol/L (ref 98–111)
Creatinine, Ser: 0.75 mg/dL (ref 0.61–1.24)
GFR, Estimated: >60 mL/min (ref >60)
Glucose, Bld: 134 mg/dL — ABNORMAL HIGH (ref 70–99)
Potassium: 3.3 mmol/L — ABNORMAL LOW (ref 3.5–5.1)
Sodium: 142 mmol/L (ref 135–145)

## 2020-03-25 LAB — MAGNESIUM: Magnesium: 1.7 mg/dL (ref 1.7–2.4)

## 2020-03-25 LAB — CBC
HCT: 37.9 % — ABNORMAL LOW (ref 39.0–52.0)
Hemoglobin: 12.5 g/dL — ABNORMAL LOW (ref 13.0–17.0)
MCH: 29.2 pg (ref 26.0–34.0)
MCHC: 33 g/dL (ref 30.0–36.0)
MCV: 88.6 fL (ref 80.0–100.0)
Platelets: 169 10*3/uL (ref 150–400)
RBC: 4.28 MIL/uL (ref 4.22–5.81)
RDW: 13.7 % (ref 11.5–15.5)
WBC: 4.2 10*3/uL (ref 4.0–10.5)
nRBC: 0 % (ref 0.0–0.2)

## 2020-03-25 LAB — HEPATIC FUNCTION PANEL
ALT: 32 U/L (ref 0–44)
AST: 16 U/L (ref 15–41)
Albumin: 3.2 g/dL — ABNORMAL LOW (ref 3.5–5.0)
Alkaline Phosphatase: 79 U/L (ref 38–126)
Bilirubin, Direct: 0.1 mg/dL (ref 0.0–0.2)
Indirect Bilirubin: 0.6 mg/dL (ref 0.3–0.9)
Total Bilirubin: 0.7 mg/dL (ref 0.3–1.2)
Total Protein: 5.9 g/dL — ABNORMAL LOW (ref 6.5–8.1)

## 2020-03-25 LAB — SURGICAL PATHOLOGY

## 2020-03-25 MED ORDER — ACETAMINOPHEN 325 MG PO TABS: 650.0000 mg | ORAL_TABLET | Freq: Four times a day (QID) | ORAL | 0 refills | Status: DC | PRN

## 2020-03-25 MED ORDER — POTASSIUM CHLORIDE CRYS ER 20 MEQ PO TBCR
40.0000 meq | EXTENDED_RELEASE_TABLET | Freq: Once | ORAL | Status: AC
  Administered 2020-03-25: 40 meq via ORAL
  Filled 2020-03-25: qty 2

## 2020-03-25 MED ORDER — PANTOPRAZOLE SODIUM 40 MG PO TBEC: 40.0000 mg | DELAYED_RELEASE_TABLET | Freq: Two times a day (BID) | ORAL | 1 refills | Status: DC

## 2020-03-25 MED ORDER — OXYCODONE HCL 5 MG PO TABS: 5.0000 mg | ORAL_TABLET | Freq: Four times a day (QID) | ORAL | 0 refills | Status: AC | PRN

## 2020-03-25 MED ORDER — ONDANSETRON 8 MG PO TBDP: 8.0000 mg | ORAL_TABLET | Freq: Three times a day (TID) | ORAL | 0 refills | Status: AC | PRN

## 2020-03-25 NOTE — Progress Notes (Signed)
Subjective:  Feels better than at time of admission. Slowly improving. Last episode of emesis on Monday. Tolerating clear liquids. Had toast for breakfast. Currently rates pain 8/10. Overall pain improved but still getting stabbing type pain which is more severe. No BM. +flatus. Taking IV pain medications regular. States pain medication being transferred to oral route. States he was advised he needed to stop smoking marijuana to be able to get the pain medication.   Objective: Vital signs in last 24 hours: Temp:  [97.9 F (36.6 C)-98.7 F (37.1 C)] 98.2 F (36.8 C) (01/13 0448) Pulse Rate:  [62-70] 62 (01/13 0448) Resp:  [16-20] 18 (01/13 0448) BP: (107-133)/(62-84) 133/84 (01/13 0448) SpO2:  [94 %-97 %] 96 % (01/13 0448) Last BM Date: 03/21/20 General:   Alert,  Well-developed, well-nourished, pleasant and cooperative in NAD Head:  Normocephalic and atraumatic. Eyes:  Sclera clear, no icterus.  Abdomen:  Soft, three palpable reducible hernias in central abdomen. Mild to moderate tenderness in epigastric/LLQ area.  Normal bowel sounds, without guarding, and without rebound.   Extremities:  Without clubbing, deformity or edema. Neurologic:  Alert and  oriented x4;  grossly normal neurologically. Skin:  Intact without significant lesions or rashes. Psych:  Alert and cooperative. Normal mood and affect.  Intake/Output from previous day: No intake/output data recorded. Intake/Output this shift: No intake/output data recorded.  Lab Results: CBC Recent Labs    03/25/20 0431  WBC 4.2  HGB 12.5*  HCT 37.9*  MCV 88.6  PLT 169   BMET Recent Labs    03/25/20 0431  NA 142  K 3.3*  CL 108  CO2 27  GLUCOSE 134*  BUN 6*  CREATININE 0.75  CALCIUM 8.5*   LFTs Recent Labs    03/24/20 1503 03/25/20 0431  BILITOT 0.6 0.7  BILIDIR 0.1 0.1  IBILI 0.5 0.6  ALKPHOS 82 79  AST 14* 16  ALT 33 32  PROT 5.8* 5.9*  ALBUMIN 3.2* 3.2*   No results for input(s): LIPASE in the last  72 hours. PT/INR No results for input(s): LABPROT, INR in the last 72 hours.    Imaging Studies: CT ABDOMEN PELVIS W CONTRAST  Result Date: 03/20/2020 CLINICAL DATA:  Abdominal pain starting last night. Nausea, vomiting and fever. EXAM: CT ABDOMEN AND PELVIS WITH CONTRAST TECHNIQUE: Multidetector CT imaging of the abdomen and pelvis was performed using the standard protocol following bolus administration of intravenous contrast. CONTRAST:  OMNIPAQUE IOHEXOL 300 MG/ML  SOLN COMPARISON:  CT abdomen dated 11/27/2018. FINDINGS: Lower chest: No acute abnormality. Hepatobiliary: No focal liver abnormality is identified. Gallbladder is absent. Stable chronic intrahepatic bile duct dilatation. Common bile duct also appears stable in size and configuration. Pancreas: Stable appearance of the pancreas, with atrophy of the pancreatic body and tail with associated pancreatic duct dilatation. Configuration of the duodenum and pancreatic head again suggest annular pancreas, stable. No peripancreatic fluid. No pseudocyst formation. Several pancreatic calcifications again noted. Spleen: Normal in size without focal abnormality. Adrenals/Urinary Tract: Adrenal glands are unremarkable. Several small renal stones bilaterally. Kidneys otherwise unremarkable without suspicious mass or hydronephrosis. No ureteral or bladder calculi. Bladder is decompressed. Stomach/Bowel: No dilated large or small bowel loops. Fairly extensive diverticulosis throughout the colon, most prominent within the sigmoid and descending colon but no focal inflammatory change to suggest acute diverticulitis. Anterior abdominal wall hernia which contains a focal segment of the small bowel but no obstruction or inflammation at the hernia site. Thickening of the walls of the stomach pylorus  and/or duodenal bulb, stable compared to the previous exam. Vascular/Lymphatic: Upper abdominal and paraesophageal varices. No acute appearing vascular abnormality. No  enlarged lymph nodes are seen in the abdomen or pelvis. Reproductive: Prostate is unremarkable. Other: No free fluid or abscess collection. No free intraperitoneal air. Musculoskeletal: No acute or suspicious osseous finding. Degenerative spondylosis of the lower lumbar spine, mild to moderate in degree. IMPRESSION: 1. Thickening of the walls of the distal stomach and duodenal bulb, similar to previous CT abdomen of 11/27/2018, suggesting recurrent or chronic gastritis/duodenitis. Consider endoscopic evaluation to ensure benignity. 2. No bowel obstruction. No evidence of acute pancreatitis. No free fluid or abscess collection. No hydronephrosis or ureteral calculi. 3. Anterior abdominal wall hernia which contains a focal segment of the small bowel but no obstruction or inflammation at the hernia site. 4. Colonic diverticulosis without evidence of acute diverticulitis. 5. Chronic intrahepatic bile duct dilatation, as previously described. Stable appearance of the atrophic pancreatic body and tail with associated pancreatic duct dilatation. Configuration of the duodenum and pancreatic head again suggests associated annular pancreas, stable. 6. Upper abdominal and paraesophageal varices. 7. Bilateral nephrolithiasis. Electronically Signed   By: Bary Richard M.D.   On: 03/20/2020 09:50  [2 weeks]   Assessment: 65 year old male with history of annular pancreas, chronic pancreatitis, followed by [facility] with prior episode of necrotizing pancreatitis in the remote past undergoing endoscopic necrosectomy in April 2014 with cystogastorostomy due to pseudocyst/necrotic area and then again in March 2015withrecurrent pseudocyst and underwent open surgical cystogastrostomy, presenting now with acute abdominal pain for the past week, N/V, and diarrhea.  Last seen by [facility] in September 2020.  Acute on chronic pancreatitis most likely due to annular pancreas.  Has not had any alcohol since 2008.  CT imaging without  obvious pancreatitis but did note distal stomach wall thickening as well as thickening of the duodenal bulb.  These findings were also noted on CT in September 2020 at [facility].  Lipase marginally elevated at 56.  ALT minimally elevated at 54 on admission, now normal. Patient states he has been doing well since his last episode of pancreatitis in 2020. 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   Gastritis: EGD January 10 with normal-appearing esophagus, gastritis (benign biopsies, no H. pylori).  Normal examined duodenum.  Upper abdominal and paraesophageal varices noted via CT.  Unremarkable liver imaging.  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  Spoke with wife over the phone while in the patient room per his request. She is calling [facility] today to set up follow up appointment.  As per previously outlined in yesterday's note, wife states that he rarely takes pain medication for his pancreas.  Typically will get a prescription and it will expire prior to him using it up.  Patient has been doing well with last episode of pancreatitis in 2020.  No issues with nausea and vomiting unless related to his pancreas.  Recently started methotrexate (not typically associated with drug induced pancreatitis) and folic acid for arthritis, approximately 3 months ago.  Plan: 1. Continue PPI twice daily for 8 weeks, then continue once daily. 2. Creon 36,000 units, 3 with meals and 2 with snacks. 3. Follow-up with [facility]. 4. If pain can be managed with oral medication he could potentially be discharged in the next 24 hours.  We would like to thank you for the opportunity to participate in the care of Justin [name].     LOS: 3 days

## 2020-03-25 NOTE — Care Management Important Message (Signed)
Important Message  Patient Details  Name: Justin [name] MRN: [id number] Date of Birth: [birth year]   Medicare Important Message Given:  Yes     Leitha Bleak, RN 03/25/2020, 12:57 PM

## 2020-03-25 NOTE — Discharge Summary (Signed)
Physician Discharge Summary  Justin [name] ION:[id number] DOB: 07-21-1955 DOA: 03/21/2020  PCP: [name], MD  Admit date: 03/21/2020 Discharge date: 03/25/2020  Time spent: 35 minutes  Recommendations for Outpatient Follow-up:  1. Repeat complete metabolic panel to follow electrolytes, renal function and LFTs. 2. Reassess thyroid panel in 6 weeks with further adjustment to medication as needed 3. Continue assisting patient with chronic pain management.   Discharge Diagnoses:  Active Problems:   Hypothyroidism   Pancreatitis   Abdominal pain   Nausea vomiting and diarrhea   Dehydration   Acute on chronic pancreatitis (HCC)   Gastritis without bleeding   Intractable vomiting   Discharge Condition: Stable and improved.  Discharged home with instruction to follow-up with PCP and with gastroenterology specialist at [facility].  CODE STATUS: Full code.  Diet recommendation: Soft/low-fat diet.  Filed Weights   03/21/20 0531 03/21/20 1501  Weight: 70.4 kg 72.1 kg    History of present illness:  As per H&P written by Dr. Kerry Hough on 03/21/2020 Justin Agee Regaladois a 64 y.o.malewith medical history significant ofchronic pancreatitis, GERD, hypertension, presents to the hospital with complaints of abdominal pain, nausea, vomiting, diarrhea. He reports that he has had the symptoms for the past 3 days. They have progressively gotten worse. He came to the ER yesterday for evaluation. At that time he had a CT abdomen done that did show some gastritis versus duodenitis. Labs were relatively unrevealing. He was treated symptomatically and discharged home. He returned to the hospital today due to recurrence/worsening of symptoms. He reports that he has been unable to keep anything down. Vomitus is dark green in color. He is thrown up multiple times. He is also having several loose stools which are dark green in color as well. He reports being febrile over the past 3 days. Symptoms did  start after he had chicken wings. No other sick contacts. He feels that his discomfort is typical of his pancreatitis pain.  ED Course:Upon arrival to the emergency room, he was noted to be mildly dehydrated, tachycardic. He was started on IV fluids with resolution of tachycardia. Labs are relatively unrevealing, lipase is only minimally elevated. He continued to vomit in the emergency room. He has been referred for admission.  Hospital Course:  1-abdominal pain, nausea/vomiting and diarrhea: Appears to be secondary to acute on chronic pancreatitis. -Continue to maintain adequate hydration -Continue as needed antiemetics and oral pain medications. -Outpatient follow-up at [facility] for further pancreatic care as recommended by GI service. -Follow soft low-fat diet. -Continue Creon.  2-gastritis: Nonhemorrhagic -Appreciate assistance and endoscopic evaluation by GI service -Continue PPI twice a day for 8 weeks -Avoid the use of NSAIDs. -No active bleeding or open ulcers appreciated on examination.  Normal duodenal bulb seen. -Outpatient follow-up with [facility] for further pancreatic care.  3-Hypothyroidism -Continue Synthroid. -Repeat thyroid panel in 4-6 weeks; further adjust regimen as needed.  4-dehydration -In the setting of GI losses and poor oral intake -Continue to maintain adequate hydration -Tolerating soft low-fat diet at time of discharge.  5-vomiting/diarrhea -As mentioned above most likely in the setting of acute on chronic pancreatitis -No further fevers and no further diarrhea since admission -WBCs within normal limits at discharge -No further antibiotic therapy will be pursued -Continue to maintain adequate hydration, as needed antiemetics and continue the use of Creon.  6-history of BPH -Continue Flomax. -No complaints of urinary retention reported.  7-depression/anxiety -No suicidal ideation or hallucination -Continue as needed clonazepam and daily use  of  Cymbalta.  Procedures: Endoscopy: Demonstrating gastritis; no active bleeding or abnormalities in his esophagus.  Normal duodenal bulb appreciated.  Consultations:  Gastroenterology  Discharge Exam: Vitals:   03/24/20 2123 03/25/20 0448  BP: 123/76 133/84  Pulse: 70 62  Resp: 16 18  Temp: 97.9 F (36.6 C) 98.2 F (36.8 C)  SpO2: 97% 96%    General: No chest pain, no nausea, no vomiting, tolerated soft diet and express good response to oral analgesics.  Still intermittently having pain in his mid abdomen radiated to the back, but feels that he is capable of keeping himself orally hydrated and take medications at home. Cardiovascular: S1 and S2, no rubs, no gallops, no JVD. Respiratory: Clear to auscultation bilaterally; no using accessory muscles.  No requiring oxygen supplementation. Abdomen: Mid discomfort to deep palpation in his mid epigastric region; no guarding, positive bowel sounds, no masses or distention appreciated. Extremities: No cyanosis or clubbing.  Discharge Instructions   Discharge Instructions    Discharge instructions   Complete by: As directed    Follow soft low-fat diet Maintain adequate hydration The medications prescribed Arrange follow-up with PCP in 10 days Follow-up with [facility] as instructed.     Allergies as of 03/25/2020      Reactions   Amoxicillin Hives, Swelling   Dilaudid [hydromorphone Hcl] Swelling   Hydromorphone Swelling      Medication List    STOP taking these medications   ibuprofen 800 MG tablet Commonly known as: ADVIL   oxyCODONE-acetaminophen 5-325 MG tablet Commonly known as: PERCOCET/ROXICET   tiZANidine 2 MG tablet Commonly known as: ZANAFLEX   traMADol 50 MG tablet Commonly known as: ULTRAM     TAKE these medications   acetaminophen 325 MG tablet Commonly known as: TYLENOL Take 2 tablets (650 mg total) by mouth every 6 (six) hours as needed for mild pain or headache (or Fever >/= 101).   albuterol 108  (90 Base) MCG/ACT inhaler Commonly known as: VENTOLIN HFA Inhale 1-2 puffs into the lungs every 6 (six) hours as needed for wheezing or shortness of breath.   aspirin 81 MG chewable tablet Chew 81 mg by mouth daily.   baclofen 10 MG tablet Commonly known as: LIORESAL Take 10 mg by mouth 3 (three) times daily as needed for muscle spasms.   clonazePAM 0.5 MG tablet Commonly known as: KLONOPIN Take 0.5 mg by mouth at bedtime. *May take one tablet three times daily as needed for anxiety*   DULoxetine 60 MG capsule Commonly known as: CYMBALTA Take 60 mg by mouth every morning.   Fenofibric Acid 105 MG Tabs Take 105 mg by mouth at bedtime.   Flax Oil-Fish Oil-Borage Oil Caps Take 1 capsule by mouth daily.   folic acid 1 MG tablet Commonly known as: FOLVITE Take 1 mg by mouth 2 (two) times daily.   gabapentin 600 MG tablet Commonly known as: NEURONTIN Take 600 mg by mouth 3 (three) times daily.   levothyroxine 112 MCG tablet Commonly known as: SYNTHROID Take 112 mcg by mouth every morning.   methotrexate 2.5 MG tablet Take 15 mg by mouth once a week.   MULTIVITAMIN ADULT PO Take 1 tablet by mouth daily.   ondansetron 8 MG disintegrating tablet Commonly known as: Zofran ODT Take 1 tablet (8 mg total) by mouth every 8 (eight) hours as needed for nausea or vomiting.   oxyCODONE 5 MG immediate release tablet Commonly known as: Oxy IR/ROXICODONE Take 1 tablet (5 mg total) by mouth every 6 (  six) hours as needed for up to 5 days for severe pain or breakthrough pain.   Pancrelipase (Lip-Prot-Amyl) 6000-19000 units Cpep Take 2-4 capsules by mouth 5 (five) times daily. Patient takes 4 capsules by mouth three times a day with meals and 2 capsules by mouth twice a day with snacks   pantoprazole 40 MG tablet Commonly known as: PROTONIX Take 1 tablet (40 mg total) by mouth 2 (two) times daily. What changed: when to take this   polyethylene glycol 17 g packet Commonly known as:  MIRALAX / GLYCOLAX Take 17 g by mouth daily as needed for mild constipation.   tamsulosin 0.4 MG Caps capsule Commonly known as: FLOMAX Take 0.4 mg by mouth 2 (two) times daily.   VITAMIN B COMPLEX PO Take by mouth.   Vitamin D3 25 MCG (1000 UT) Caps Take 1,000 Units by mouth.      Allergies  Allergen Reactions  . Amoxicillin Hives and Swelling  . Dilaudid [Hydromorphone Hcl] Swelling  . Hydromorphone Swelling    Follow-up Information    Vyas, Dhruv B, MD. Schedule an appointment as soon as possible for a visit in 10 day(s).   Specialty: Internal Medicine Contact information: [address] [place] [address] [phone]               The results of significant diagnostics from this hospitalization (including imaging, microbiology, ancillary and laboratory) are listed below for reference.    Significant Diagnostic Studies: CT ABDOMEN PELVIS W CONTRAST  Result Date: 03/20/2020 CLINICAL DATA:  Abdominal pain starting last night. Nausea, vomiting and fever. EXAM: CT ABDOMEN AND PELVIS WITH CONTRAST TECHNIQUE: Multidetector CT imaging of the abdomen and pelvis was performed using the standard protocol following bolus administration of intravenous contrast. CONTRAST:  OMNIPAQUE IOHEXOL 300 MG/ML  SOLN COMPARISON:  CT abdomen dated 11/27/2018. FINDINGS: Lower chest: No acute abnormality. Hepatobiliary: No focal liver abnormality is identified. Gallbladder is absent. Stable chronic intrahepatic bile duct dilatation. Common bile duct also appears stable in size and configuration. Pancreas: Stable appearance of the pancreas, with atrophy of the pancreatic body and tail with associated pancreatic duct dilatation. Configuration of the duodenum and pancreatic head again suggest annular pancreas, stable. No peripancreatic fluid. No pseudocyst formation. Several pancreatic calcifications again noted. Spleen: Normal in size without focal abnormality. Adrenals/Urinary Tract: Adrenal  glands are unremarkable. Several small renal stones bilaterally. Kidneys otherwise unremarkable without suspicious mass or hydronephrosis. No ureteral or bladder calculi. Bladder is decompressed. Stomach/Bowel: No dilated large or small bowel loops. Fairly extensive diverticulosis throughout the colon, most prominent within the sigmoid and descending colon but no focal inflammatory change to suggest acute diverticulitis. Anterior abdominal wall hernia which contains a focal segment of the small bowel but no obstruction or inflammation at the hernia site. Thickening of the walls of the stomach pylorus and/or duodenal bulb, stable compared to the previous exam. Vascular/Lymphatic: Upper abdominal and paraesophageal varices. No acute appearing vascular abnormality. No enlarged lymph nodes are seen in the abdomen or pelvis. Reproductive: Prostate is unremarkable. Other: No free fluid or abscess collection. No free intraperitoneal air. Musculoskeletal: No acute or suspicious osseous finding. Degenerative spondylosis of the lower lumbar spine, mild to moderate in degree. IMPRESSION: 1. Thickening of the walls of the distal stomach and duodenal bulb, similar to previous CT abdomen of 11/27/2018, suggesting recurrent or chronic gastritis/duodenitis. Consider endoscopic evaluation to ensure benignity. 2. No bowel obstruction. No evidence of acute pancreatitis. No free fluid or abscess collection. No hydronephrosis  or ureteral calculi. 3. Anterior abdominal wall hernia which contains a focal segment of the small bowel but no obstruction or inflammation at the hernia site. 4. Colonic diverticulosis without evidence of acute diverticulitis. 5. Chronic intrahepatic bile duct dilatation, as previously described. Stable appearance of the atrophic pancreatic body and tail with associated pancreatic duct dilatation. Configuration of the duodenum and pancreatic head again suggests associated annular pancreas, stable. 6. Upper  abdominal and paraesophageal varices. 7. Bilateral nephrolithiasis. Electronically Signed   By: Bary Richard M.D.   On: 03/20/2020 09:50   Microbiology: Recent Results (from the past 240 hour(s))  Resp Panel by RT-PCR (Flu A&B, Covid) Nasopharyngeal Swab     Status: None   Collection Time: 03/21/20  7:46 AM   Specimen: Nasopharyngeal Swab; Nasopharyngeal(NP) swabs in vial transport medium  Result Value Ref Range Status   SARS Coronavirus 2 by RT PCR NEGATIVE NEGATIVE Final    Comment: (NOTE) SARS-CoV-2 target nucleic acids are NOT DETECTED.  The SARS-CoV-2 RNA is generally detectable in upper respiratory specimens during the acute phase of infection. The lowest concentration of SARS-CoV-2 viral copies this assay can detect is 138 copies/mL. A negative result does not preclude SARS-Cov-2 infection and should not be used as the sole basis for treatment or other patient management decisions. A negative result may occur with  improper specimen collection/handling, submission of specimen other than nasopharyngeal swab, presence of viral mutation(s) within the areas targeted by this assay, and inadequate number of viral copies(<138 copies/mL). A negative result must be combined with clinical observations, patient history, and epidemiological information. The expected result is Negative.  Fact Sheet for Patients:  BloggerCourse.com  Fact Sheet for Healthcare Providers:  SeriousBroker.it  This test is no t yet approved or cleared by the [place] FDA and  has been authorized for detection and/or diagnosis of SARS-CoV-2 by FDA under an Emergency Use Authorization (EUA). This EUA will remain  in effect (meaning this test can be used) for the duration of the COVID-19 declaration under Section 564(b)(1) of the Act, 21 U.S.C.section 360bbb-3(b)(1), unless the authorization is terminated  or revoked sooner.       Influenza A by PCR  NEGATIVE NEGATIVE Final   Influenza B by PCR NEGATIVE NEGATIVE Final    Comment: (NOTE) The Xpert Xpress SARS-CoV-2/FLU/RSV plus assay is intended as an aid in the diagnosis of influenza from Nasopharyngeal swab specimens and should not be used as a sole basis for treatment. Nasal washings and aspirates are unacceptable for Xpert Xpress SARS-CoV-2/FLU/RSV testing.  Fact Sheet for Patients: BloggerCourse.com  Fact Sheet for Healthcare Providers: SeriousBroker.it  This test is not yet approved or cleared by the [place] FDA and has been authorized for detection and/or diagnosis of SARS-CoV-2 by FDA under an Emergency Use Authorization (EUA). This EUA will remain in effect (meaning this test can be used) for the duration of the COVID-19 declaration under Section 564(b)(1) of the Act, 21 U.S.C. section 360bbb-3(b)(1), unless the authorization is terminated or revoked.  Performed at [facility], [address] [place], [place] [address]      Labs: Basic Metabolic Panel: Recent Labs  Lab 03/20/20 0715 03/21/20 0745 03/22/20 0447 03/25/20 0431  NA 138 143 141 142  K 3.5 4.6 3.5 3.3*  CL 105 106 109 108  CO2 GLUCOSE 123* 121* 100* 134*  BUN 6*  CREATININE 0.88 0.92 0.82 0.75  CALCIUM 9.3 10.0 8.4* 8.5*  MG  --   --   --  1.7   Liver Function Tests: Recent Labs  Lab 03/20/20 0715 03/21/20 0745 03/24/20 1503 03/25/20 0431  AST 24 29 14* 16  ALT 36 54* 33 32  ALKPHOS 97 113 82 79  BILITOT 1.0 1.2 0.6 0.7  PROT 7.9 8.5* 5.8* 5.9*  ALBUMIN 4.4 4.8 3.2* 3.2*   Recent Labs  Lab 03/20/20 0715 03/21/20 0745 03/22/20 0447  LIPASE 38 56* 30   CBC: Recent Labs  Lab 03/20/20 0715 03/21/20 0745 03/22/20 0447 03/25/20 0431  WBC 5.4 14.1* 4.9 4.2  NEUTROABS  --  12.7*  --   --   HGB 15.6 16.8 13.1 12.5*  HCT 46.4 49.8 40.3 37.9*  MCV 87.2 87.5 89.0 88.6  PLT 197 218 179 169     Signed:  Vassie Loll MD.  [facility] 03/25/2020, 12:04 PM

## 2020-04-05 DIAGNOSIS — K861 Other chronic pancreatitis: Secondary | ICD-10-CM | POA: Diagnosis not present

## 2020-04-26 DIAGNOSIS — M069 Rheumatoid arthritis, unspecified: Secondary | ICD-10-CM | POA: Diagnosis not present

## 2020-04-26 DIAGNOSIS — Z299 Encounter for prophylactic measures, unspecified: Secondary | ICD-10-CM | POA: Diagnosis not present

## 2020-04-26 DIAGNOSIS — Z789 Other specified health status: Secondary | ICD-10-CM | POA: Diagnosis not present

## 2020-04-26 DIAGNOSIS — T451X5S Adverse effect of antineoplastic and immunosuppressive drugs, sequela: Secondary | ICD-10-CM | POA: Diagnosis not present

## 2020-04-26 DIAGNOSIS — K861 Other chronic pancreatitis: Secondary | ICD-10-CM | POA: Diagnosis not present

## 2020-04-26 DIAGNOSIS — Z6824 Body mass index (BMI) 24.0-24.9, adult: Secondary | ICD-10-CM | POA: Diagnosis not present

## 2020-06-01 DIAGNOSIS — Z79899 Other long term (current) drug therapy: Secondary | ICD-10-CM | POA: Diagnosis not present

## 2020-06-01 DIAGNOSIS — E039 Hypothyroidism, unspecified: Secondary | ICD-10-CM | POA: Diagnosis not present

## 2020-06-01 DIAGNOSIS — E7800 Pure hypercholesterolemia, unspecified: Secondary | ICD-10-CM | POA: Diagnosis not present

## 2020-06-01 DIAGNOSIS — Z789 Other specified health status: Secondary | ICD-10-CM | POA: Diagnosis not present

## 2020-06-01 DIAGNOSIS — Z299 Encounter for prophylactic measures, unspecified: Secondary | ICD-10-CM | POA: Diagnosis not present

## 2020-06-01 DIAGNOSIS — Z7189 Other specified counseling: Secondary | ICD-10-CM | POA: Diagnosis not present

## 2020-06-01 DIAGNOSIS — R5383 Other fatigue: Secondary | ICD-10-CM | POA: Diagnosis not present

## 2020-06-01 DIAGNOSIS — Z0000 Encounter for general adult medical examination without abnormal findings: Secondary | ICD-10-CM | POA: Diagnosis not present

## 2020-06-21 DIAGNOSIS — Z299 Encounter for prophylactic measures, unspecified: Secondary | ICD-10-CM | POA: Diagnosis not present

## 2020-06-21 DIAGNOSIS — H1131 Conjunctival hemorrhage, right eye: Secondary | ICD-10-CM | POA: Diagnosis not present

## 2020-06-21 DIAGNOSIS — M797 Fibromyalgia: Secondary | ICD-10-CM | POA: Diagnosis not present

## 2020-06-21 DIAGNOSIS — K551 Chronic vascular disorders of intestine: Secondary | ICD-10-CM | POA: Diagnosis not present

## 2020-06-29 DIAGNOSIS — J40 Bronchitis, not specified as acute or chronic: Secondary | ICD-10-CM | POA: Diagnosis not present

## 2020-06-29 DIAGNOSIS — R059 Cough, unspecified: Secondary | ICD-10-CM | POA: Diagnosis not present

## 2020-06-29 DIAGNOSIS — Z299 Encounter for prophylactic measures, unspecified: Secondary | ICD-10-CM | POA: Diagnosis not present

## 2020-06-29 DIAGNOSIS — J449 Chronic obstructive pulmonary disease, unspecified: Secondary | ICD-10-CM | POA: Diagnosis not present

## 2020-06-29 DIAGNOSIS — M064 Inflammatory polyarthropathy: Secondary | ICD-10-CM | POA: Diagnosis not present

## 2020-07-16 DIAGNOSIS — T675XXA Heat exhaustion, unspecified, initial encounter: Secondary | ICD-10-CM | POA: Diagnosis not present

## 2020-07-16 DIAGNOSIS — J449 Chronic obstructive pulmonary disease, unspecified: Secondary | ICD-10-CM | POA: Diagnosis not present

## 2020-07-16 DIAGNOSIS — K861 Other chronic pancreatitis: Secondary | ICD-10-CM | POA: Diagnosis not present

## 2020-07-16 DIAGNOSIS — J069 Acute upper respiratory infection, unspecified: Secondary | ICD-10-CM | POA: Diagnosis not present

## 2020-07-16 DIAGNOSIS — Z299 Encounter for prophylactic measures, unspecified: Secondary | ICD-10-CM | POA: Diagnosis not present

## 2020-07-23 DIAGNOSIS — J449 Chronic obstructive pulmonary disease, unspecified: Secondary | ICD-10-CM | POA: Diagnosis not present

## 2020-07-23 DIAGNOSIS — J069 Acute upper respiratory infection, unspecified: Secondary | ICD-10-CM | POA: Diagnosis not present

## 2020-07-23 DIAGNOSIS — Z299 Encounter for prophylactic measures, unspecified: Secondary | ICD-10-CM | POA: Diagnosis not present

## 2020-07-23 DIAGNOSIS — M064 Inflammatory polyarthropathy: Secondary | ICD-10-CM | POA: Diagnosis not present

## 2020-09-07 DIAGNOSIS — Z299 Encounter for prophylactic measures, unspecified: Secondary | ICD-10-CM | POA: Diagnosis not present

## 2020-09-07 DIAGNOSIS — R5383 Other fatigue: Secondary | ICD-10-CM | POA: Diagnosis not present

## 2020-09-07 DIAGNOSIS — E119 Type 2 diabetes mellitus without complications: Secondary | ICD-10-CM | POA: Diagnosis not present

## 2020-09-07 DIAGNOSIS — R42 Dizziness and giddiness: Secondary | ICD-10-CM | POA: Diagnosis not present

## 2020-10-06 DIAGNOSIS — Z299 Encounter for prophylactic measures, unspecified: Secondary | ICD-10-CM | POA: Diagnosis not present

## 2020-10-06 DIAGNOSIS — J449 Chronic obstructive pulmonary disease, unspecified: Secondary | ICD-10-CM | POA: Diagnosis not present

## 2020-10-06 DIAGNOSIS — K861 Other chronic pancreatitis: Secondary | ICD-10-CM | POA: Diagnosis not present

## 2021-01-13 DIAGNOSIS — J0100 Acute maxillary sinusitis, unspecified: Secondary | ICD-10-CM | POA: Diagnosis not present

## 2021-01-13 DIAGNOSIS — R059 Cough, unspecified: Secondary | ICD-10-CM | POA: Diagnosis not present

## 2021-01-13 DIAGNOSIS — J4520 Mild intermittent asthma, uncomplicated: Secondary | ICD-10-CM | POA: Diagnosis not present

## 2021-01-31 DIAGNOSIS — Z299 Encounter for prophylactic measures, unspecified: Secondary | ICD-10-CM | POA: Diagnosis not present

## 2021-01-31 DIAGNOSIS — E785 Hyperlipidemia, unspecified: Secondary | ICD-10-CM | POA: Diagnosis not present

## 2021-01-31 DIAGNOSIS — H6692 Otitis media, unspecified, left ear: Secondary | ICD-10-CM | POA: Diagnosis not present

## 2021-01-31 DIAGNOSIS — E039 Hypothyroidism, unspecified: Secondary | ICD-10-CM | POA: Diagnosis not present

## 2021-01-31 DIAGNOSIS — K219 Gastro-esophageal reflux disease without esophagitis: Secondary | ICD-10-CM | POA: Diagnosis not present

## 2021-02-09 DIAGNOSIS — M25562 Pain in left knee: Secondary | ICD-10-CM | POA: Diagnosis not present

## 2021-02-09 DIAGNOSIS — M130 Polyarthritis, unspecified: Secondary | ICD-10-CM | POA: Diagnosis not present

## 2021-02-09 DIAGNOSIS — Z6824 Body mass index (BMI) 24.0-24.9, adult: Secondary | ICD-10-CM | POA: Diagnosis not present

## 2021-02-09 DIAGNOSIS — M7989 Other specified soft tissue disorders: Secondary | ICD-10-CM | POA: Diagnosis not present

## 2021-02-09 DIAGNOSIS — R5383 Other fatigue: Secondary | ICD-10-CM | POA: Diagnosis not present

## 2021-02-09 DIAGNOSIS — M25561 Pain in right knee: Secondary | ICD-10-CM | POA: Diagnosis not present

## 2021-03-08 DIAGNOSIS — M797 Fibromyalgia: Secondary | ICD-10-CM | POA: Diagnosis not present

## 2021-03-08 DIAGNOSIS — M130 Polyarthritis, unspecified: Secondary | ICD-10-CM | POA: Diagnosis not present

## 2021-03-08 DIAGNOSIS — M5136 Other intervertebral disc degeneration, lumbar region: Secondary | ICD-10-CM | POA: Diagnosis not present

## 2021-03-08 DIAGNOSIS — M150 Primary generalized (osteo)arthritis: Secondary | ICD-10-CM | POA: Diagnosis not present

## 2021-04-05 DIAGNOSIS — E039 Hypothyroidism, unspecified: Secondary | ICD-10-CM | POA: Diagnosis not present

## 2021-04-05 DIAGNOSIS — R5383 Other fatigue: Secondary | ICD-10-CM | POA: Diagnosis not present

## 2021-04-05 DIAGNOSIS — K551 Chronic vascular disorders of intestine: Secondary | ICD-10-CM | POA: Diagnosis not present

## 2021-04-05 DIAGNOSIS — I10 Essential (primary) hypertension: Secondary | ICD-10-CM | POA: Diagnosis not present

## 2021-04-05 DIAGNOSIS — Z79899 Other long term (current) drug therapy: Secondary | ICD-10-CM | POA: Diagnosis not present

## 2021-04-05 DIAGNOSIS — Z299 Encounter for prophylactic measures, unspecified: Secondary | ICD-10-CM | POA: Diagnosis not present

## 2021-04-05 DIAGNOSIS — E119 Type 2 diabetes mellitus without complications: Secondary | ICD-10-CM | POA: Diagnosis not present

## 2021-04-05 DIAGNOSIS — J449 Chronic obstructive pulmonary disease, unspecified: Secondary | ICD-10-CM | POA: Diagnosis not present

## 2021-04-05 DIAGNOSIS — M069 Rheumatoid arthritis, unspecified: Secondary | ICD-10-CM | POA: Diagnosis not present

## 2021-04-09 DIAGNOSIS — G894 Chronic pain syndrome: Secondary | ICD-10-CM | POA: Diagnosis not present

## 2021-04-09 DIAGNOSIS — M5136 Other intervertebral disc degeneration, lumbar region: Secondary | ICD-10-CM | POA: Diagnosis not present

## 2021-04-09 DIAGNOSIS — M961 Postlaminectomy syndrome, not elsewhere classified: Secondary | ICD-10-CM | POA: Diagnosis not present

## 2021-04-09 DIAGNOSIS — M5416 Radiculopathy, lumbar region: Secondary | ICD-10-CM | POA: Diagnosis not present

## 2021-04-18 DIAGNOSIS — M13842 Other specified arthritis, left hand: Secondary | ICD-10-CM | POA: Diagnosis not present

## 2021-04-18 DIAGNOSIS — M79642 Pain in left hand: Secondary | ICD-10-CM | POA: Diagnosis not present

## 2021-04-18 DIAGNOSIS — M79641 Pain in right hand: Secondary | ICD-10-CM | POA: Diagnosis not present

## 2021-04-18 DIAGNOSIS — M13841 Other specified arthritis, right hand: Secondary | ICD-10-CM | POA: Diagnosis not present

## 2021-05-02 DIAGNOSIS — Z789 Other specified health status: Secondary | ICD-10-CM | POA: Diagnosis not present

## 2021-05-02 DIAGNOSIS — Z299 Encounter for prophylactic measures, unspecified: Secondary | ICD-10-CM | POA: Diagnosis not present

## 2021-05-02 DIAGNOSIS — J441 Chronic obstructive pulmonary disease with (acute) exacerbation: Secondary | ICD-10-CM | POA: Diagnosis not present

## 2021-05-02 DIAGNOSIS — M069 Rheumatoid arthritis, unspecified: Secondary | ICD-10-CM | POA: Diagnosis not present

## 2021-05-02 DIAGNOSIS — M064 Inflammatory polyarthropathy: Secondary | ICD-10-CM | POA: Diagnosis not present

## 2021-05-04 DIAGNOSIS — M069 Rheumatoid arthritis, unspecified: Secondary | ICD-10-CM | POA: Diagnosis not present

## 2021-05-04 DIAGNOSIS — I10 Essential (primary) hypertension: Secondary | ICD-10-CM | POA: Diagnosis not present

## 2021-05-04 DIAGNOSIS — J449 Chronic obstructive pulmonary disease, unspecified: Secondary | ICD-10-CM | POA: Diagnosis not present

## 2021-05-04 DIAGNOSIS — Z299 Encounter for prophylactic measures, unspecified: Secondary | ICD-10-CM | POA: Diagnosis not present

## 2021-05-09 DIAGNOSIS — K861 Other chronic pancreatitis: Secondary | ICD-10-CM | POA: Diagnosis not present

## 2021-05-09 DIAGNOSIS — R059 Cough, unspecified: Secondary | ICD-10-CM | POA: Diagnosis not present

## 2021-05-09 DIAGNOSIS — Z789 Other specified health status: Secondary | ICD-10-CM | POA: Diagnosis not present

## 2021-05-09 DIAGNOSIS — J441 Chronic obstructive pulmonary disease with (acute) exacerbation: Secondary | ICD-10-CM | POA: Diagnosis not present

## 2021-05-09 DIAGNOSIS — I10 Essential (primary) hypertension: Secondary | ICD-10-CM | POA: Diagnosis not present

## 2021-05-09 DIAGNOSIS — Z299 Encounter for prophylactic measures, unspecified: Secondary | ICD-10-CM | POA: Diagnosis not present

## 2021-05-09 DIAGNOSIS — D692 Other nonthrombocytopenic purpura: Secondary | ICD-10-CM | POA: Diagnosis not present

## 2021-05-16 DIAGNOSIS — J441 Chronic obstructive pulmonary disease with (acute) exacerbation: Secondary | ICD-10-CM | POA: Diagnosis not present

## 2021-05-16 DIAGNOSIS — I10 Essential (primary) hypertension: Secondary | ICD-10-CM | POA: Diagnosis not present

## 2021-05-16 DIAGNOSIS — Z299 Encounter for prophylactic measures, unspecified: Secondary | ICD-10-CM | POA: Diagnosis not present

## 2021-05-17 DIAGNOSIS — J441 Chronic obstructive pulmonary disease with (acute) exacerbation: Secondary | ICD-10-CM | POA: Diagnosis not present

## 2021-06-07 DIAGNOSIS — R5383 Other fatigue: Secondary | ICD-10-CM | POA: Diagnosis not present

## 2021-06-07 DIAGNOSIS — E7800 Pure hypercholesterolemia, unspecified: Secondary | ICD-10-CM | POA: Diagnosis not present

## 2021-06-07 DIAGNOSIS — Z713 Dietary counseling and surveillance: Secondary | ICD-10-CM | POA: Diagnosis not present

## 2021-06-07 DIAGNOSIS — Z7189 Other specified counseling: Secondary | ICD-10-CM | POA: Diagnosis not present

## 2021-06-07 DIAGNOSIS — Z0000 Encounter for general adult medical examination without abnormal findings: Secondary | ICD-10-CM | POA: Diagnosis not present

## 2021-06-07 DIAGNOSIS — I10 Essential (primary) hypertension: Secondary | ICD-10-CM | POA: Diagnosis not present

## 2021-06-07 DIAGNOSIS — Z299 Encounter for prophylactic measures, unspecified: Secondary | ICD-10-CM | POA: Diagnosis not present

## 2021-06-07 DIAGNOSIS — Z79899 Other long term (current) drug therapy: Secondary | ICD-10-CM | POA: Diagnosis not present

## 2021-06-15 DIAGNOSIS — Z299 Encounter for prophylactic measures, unspecified: Secondary | ICD-10-CM | POA: Diagnosis not present

## 2021-06-15 DIAGNOSIS — I10 Essential (primary) hypertension: Secondary | ICD-10-CM | POA: Diagnosis not present

## 2021-06-15 DIAGNOSIS — J441 Chronic obstructive pulmonary disease with (acute) exacerbation: Secondary | ICD-10-CM | POA: Diagnosis not present

## 2021-06-22 DIAGNOSIS — K8681 Exocrine pancreatic insufficiency: Secondary | ICD-10-CM | POA: Diagnosis not present

## 2021-06-22 DIAGNOSIS — K861 Other chronic pancreatitis: Secondary | ICD-10-CM | POA: Diagnosis not present

## 2021-06-24 DIAGNOSIS — Z1212 Encounter for screening for malignant neoplasm of rectum: Secondary | ICD-10-CM | POA: Diagnosis not present

## 2021-06-24 DIAGNOSIS — Z1211 Encounter for screening for malignant neoplasm of colon: Secondary | ICD-10-CM | POA: Diagnosis not present

## 2021-07-02 LAB — COLOGUARD: COLOGUARD: NEGATIVE

## 2021-08-25 DIAGNOSIS — J449 Chronic obstructive pulmonary disease, unspecified: Secondary | ICD-10-CM | POA: Diagnosis not present

## 2021-08-25 DIAGNOSIS — E039 Hypothyroidism, unspecified: Secondary | ICD-10-CM | POA: Diagnosis not present

## 2021-08-25 DIAGNOSIS — I10 Essential (primary) hypertension: Secondary | ICD-10-CM | POA: Diagnosis not present

## 2021-08-25 DIAGNOSIS — Z299 Encounter for prophylactic measures, unspecified: Secondary | ICD-10-CM | POA: Diagnosis not present

## 2021-08-25 DIAGNOSIS — M069 Rheumatoid arthritis, unspecified: Secondary | ICD-10-CM | POA: Diagnosis not present

## 2021-10-12 DIAGNOSIS — J449 Chronic obstructive pulmonary disease, unspecified: Secondary | ICD-10-CM | POA: Diagnosis not present

## 2021-10-12 DIAGNOSIS — Z0000 Encounter for general adult medical examination without abnormal findings: Secondary | ICD-10-CM | POA: Diagnosis not present

## 2021-10-12 DIAGNOSIS — I10 Essential (primary) hypertension: Secondary | ICD-10-CM | POA: Diagnosis not present

## 2021-10-12 DIAGNOSIS — E7800 Pure hypercholesterolemia, unspecified: Secondary | ICD-10-CM | POA: Diagnosis not present

## 2021-10-12 DIAGNOSIS — Z299 Encounter for prophylactic measures, unspecified: Secondary | ICD-10-CM | POA: Diagnosis not present

## 2021-10-12 DIAGNOSIS — E119 Type 2 diabetes mellitus without complications: Secondary | ICD-10-CM | POA: Diagnosis not present

## 2021-10-12 DIAGNOSIS — M069 Rheumatoid arthritis, unspecified: Secondary | ICD-10-CM | POA: Diagnosis not present

## 2022-01-11 DIAGNOSIS — M064 Inflammatory polyarthropathy: Secondary | ICD-10-CM | POA: Diagnosis not present

## 2022-01-11 DIAGNOSIS — Z299 Encounter for prophylactic measures, unspecified: Secondary | ICD-10-CM | POA: Diagnosis not present

## 2022-01-11 DIAGNOSIS — I10 Essential (primary) hypertension: Secondary | ICD-10-CM | POA: Diagnosis not present

## 2022-01-16 DIAGNOSIS — I10 Essential (primary) hypertension: Secondary | ICD-10-CM | POA: Diagnosis not present

## 2022-01-16 DIAGNOSIS — Z299 Encounter for prophylactic measures, unspecified: Secondary | ICD-10-CM | POA: Diagnosis not present

## 2022-01-16 DIAGNOSIS — E1140 Type 2 diabetes mellitus with diabetic neuropathy, unspecified: Secondary | ICD-10-CM | POA: Diagnosis not present

## 2022-01-16 DIAGNOSIS — E1165 Type 2 diabetes mellitus with hyperglycemia: Secondary | ICD-10-CM | POA: Diagnosis not present

## 2022-02-09 DIAGNOSIS — E785 Hyperlipidemia, unspecified: Secondary | ICD-10-CM | POA: Diagnosis not present

## 2022-02-09 DIAGNOSIS — M159 Polyosteoarthritis, unspecified: Secondary | ICD-10-CM | POA: Diagnosis not present

## 2022-02-16 DIAGNOSIS — M13841 Other specified arthritis, right hand: Secondary | ICD-10-CM | POA: Diagnosis not present

## 2022-02-16 DIAGNOSIS — M13849 Other specified arthritis, unspecified hand: Secondary | ICD-10-CM | POA: Diagnosis not present

## 2022-02-16 DIAGNOSIS — M79641 Pain in right hand: Secondary | ICD-10-CM | POA: Diagnosis not present

## 2022-02-16 DIAGNOSIS — M79642 Pain in left hand: Secondary | ICD-10-CM | POA: Diagnosis not present

## 2022-03-08 DIAGNOSIS — M961 Postlaminectomy syndrome, not elsewhere classified: Secondary | ICD-10-CM | POA: Diagnosis not present

## 2022-03-08 DIAGNOSIS — M5136 Other intervertebral disc degeneration, lumbar region: Secondary | ICD-10-CM | POA: Diagnosis not present

## 2022-03-08 DIAGNOSIS — M5416 Radiculopathy, lumbar region: Secondary | ICD-10-CM | POA: Diagnosis not present

## 2022-03-08 DIAGNOSIS — G894 Chronic pain syndrome: Secondary | ICD-10-CM | POA: Diagnosis not present

## 2022-03-27 DIAGNOSIS — I10 Essential (primary) hypertension: Secondary | ICD-10-CM | POA: Diagnosis not present

## 2022-03-27 DIAGNOSIS — K861 Other chronic pancreatitis: Secondary | ICD-10-CM | POA: Diagnosis not present

## 2022-03-27 DIAGNOSIS — B353 Tinea pedis: Secondary | ICD-10-CM | POA: Diagnosis not present

## 2022-03-27 DIAGNOSIS — K551 Chronic vascular disorders of intestine: Secondary | ICD-10-CM | POA: Diagnosis not present

## 2022-03-27 DIAGNOSIS — Z299 Encounter for prophylactic measures, unspecified: Secondary | ICD-10-CM | POA: Diagnosis not present

## 2022-04-25 DIAGNOSIS — E119 Type 2 diabetes mellitus without complications: Secondary | ICD-10-CM | POA: Diagnosis not present

## 2022-04-25 DIAGNOSIS — H60542 Acute eczematoid otitis externa, left ear: Secondary | ICD-10-CM | POA: Diagnosis not present

## 2022-04-25 DIAGNOSIS — Z299 Encounter for prophylactic measures, unspecified: Secondary | ICD-10-CM | POA: Diagnosis not present

## 2022-04-25 DIAGNOSIS — E1140 Type 2 diabetes mellitus with diabetic neuropathy, unspecified: Secondary | ICD-10-CM | POA: Diagnosis not present

## 2022-04-25 DIAGNOSIS — I10 Essential (primary) hypertension: Secondary | ICD-10-CM | POA: Diagnosis not present

## 2022-05-17 DIAGNOSIS — J441 Chronic obstructive pulmonary disease with (acute) exacerbation: Secondary | ICD-10-CM | POA: Diagnosis not present

## 2022-05-17 DIAGNOSIS — I10 Essential (primary) hypertension: Secondary | ICD-10-CM | POA: Diagnosis not present

## 2022-05-17 DIAGNOSIS — Z299 Encounter for prophylactic measures, unspecified: Secondary | ICD-10-CM | POA: Diagnosis not present

## 2022-05-17 DIAGNOSIS — J329 Chronic sinusitis, unspecified: Secondary | ICD-10-CM | POA: Diagnosis not present

## 2022-06-06 DIAGNOSIS — J329 Chronic sinusitis, unspecified: Secondary | ICD-10-CM | POA: Diagnosis not present

## 2022-06-06 DIAGNOSIS — R0981 Nasal congestion: Secondary | ICD-10-CM | POA: Diagnosis not present

## 2022-06-06 DIAGNOSIS — Z299 Encounter for prophylactic measures, unspecified: Secondary | ICD-10-CM | POA: Diagnosis not present

## 2022-06-14 DIAGNOSIS — I10 Essential (primary) hypertension: Secondary | ICD-10-CM | POA: Diagnosis not present

## 2022-06-14 DIAGNOSIS — M069 Rheumatoid arthritis, unspecified: Secondary | ICD-10-CM | POA: Diagnosis not present

## 2022-06-14 DIAGNOSIS — Z0000 Encounter for general adult medical examination without abnormal findings: Secondary | ICD-10-CM | POA: Diagnosis not present

## 2022-06-14 DIAGNOSIS — Z299 Encounter for prophylactic measures, unspecified: Secondary | ICD-10-CM | POA: Diagnosis not present

## 2022-06-14 DIAGNOSIS — Z7189 Other specified counseling: Secondary | ICD-10-CM | POA: Diagnosis not present

## 2022-06-14 DIAGNOSIS — J449 Chronic obstructive pulmonary disease, unspecified: Secondary | ICD-10-CM | POA: Diagnosis not present

## 2022-06-14 DIAGNOSIS — Z6824 Body mass index (BMI) 24.0-24.9, adult: Secondary | ICD-10-CM | POA: Diagnosis not present

## 2022-07-07 DIAGNOSIS — M170 Bilateral primary osteoarthritis of knee: Secondary | ICD-10-CM | POA: Diagnosis not present

## 2022-07-07 DIAGNOSIS — M25561 Pain in right knee: Secondary | ICD-10-CM | POA: Diagnosis not present

## 2022-07-27 DIAGNOSIS — I10 Essential (primary) hypertension: Secondary | ICD-10-CM | POA: Diagnosis not present

## 2022-07-27 DIAGNOSIS — E1165 Type 2 diabetes mellitus with hyperglycemia: Secondary | ICD-10-CM | POA: Diagnosis not present

## 2022-07-27 DIAGNOSIS — M064 Inflammatory polyarthropathy: Secondary | ICD-10-CM | POA: Diagnosis not present

## 2022-07-27 DIAGNOSIS — Z299 Encounter for prophylactic measures, unspecified: Secondary | ICD-10-CM | POA: Diagnosis not present

## 2022-09-21 DIAGNOSIS — M25511 Pain in right shoulder: Secondary | ICD-10-CM | POA: Diagnosis not present

## 2022-09-22 DIAGNOSIS — M7531 Calcific tendinitis of right shoulder: Secondary | ICD-10-CM | POA: Diagnosis not present

## 2022-10-06 DIAGNOSIS — M25512 Pain in left shoulder: Secondary | ICD-10-CM | POA: Diagnosis not present

## 2022-10-06 DIAGNOSIS — N3281 Overactive bladder: Secondary | ICD-10-CM | POA: Diagnosis not present

## 2022-10-06 DIAGNOSIS — R350 Frequency of micturition: Secondary | ICD-10-CM | POA: Diagnosis not present

## 2022-10-06 DIAGNOSIS — I10 Essential (primary) hypertension: Secondary | ICD-10-CM | POA: Diagnosis not present

## 2022-10-06 DIAGNOSIS — Z299 Encounter for prophylactic measures, unspecified: Secondary | ICD-10-CM | POA: Diagnosis not present

## 2022-10-10 DIAGNOSIS — M25511 Pain in right shoulder: Secondary | ICD-10-CM | POA: Diagnosis not present

## 2022-10-17 DIAGNOSIS — I10 Essential (primary) hypertension: Secondary | ICD-10-CM | POA: Diagnosis not present

## 2022-10-17 DIAGNOSIS — R5383 Other fatigue: Secondary | ICD-10-CM | POA: Diagnosis not present

## 2022-10-17 DIAGNOSIS — Z0000 Encounter for general adult medical examination without abnormal findings: Secondary | ICD-10-CM | POA: Diagnosis not present

## 2022-10-17 DIAGNOSIS — E039 Hypothyroidism, unspecified: Secondary | ICD-10-CM | POA: Diagnosis not present

## 2022-10-17 DIAGNOSIS — Z299 Encounter for prophylactic measures, unspecified: Secondary | ICD-10-CM | POA: Diagnosis not present

## 2022-10-17 DIAGNOSIS — E7800 Pure hypercholesterolemia, unspecified: Secondary | ICD-10-CM | POA: Diagnosis not present

## 2022-10-18 DIAGNOSIS — Z79899 Other long term (current) drug therapy: Secondary | ICD-10-CM | POA: Diagnosis not present

## 2022-10-18 DIAGNOSIS — E039 Hypothyroidism, unspecified: Secondary | ICD-10-CM | POA: Diagnosis not present

## 2022-10-18 DIAGNOSIS — E7800 Pure hypercholesterolemia, unspecified: Secondary | ICD-10-CM | POA: Diagnosis not present

## 2022-10-18 DIAGNOSIS — R5383 Other fatigue: Secondary | ICD-10-CM | POA: Diagnosis not present

## 2022-10-18 IMAGING — CT CT ABD-PELV W/ CM
2 of 5 series · 15 of 46 positions shown, 17 images · IV contrast (omnipaque)
Comparison: CT abdomen dated 11/27/2018.

CLINICAL DATA: Abdominal pain starting last night. Nausea, vomiting
and fever.

EXAM:
CT ABDOMEN AND PELVIS WITH CONTRAST
TECHNIQUE: Multidetector CT imaging of the abdomen and pelvis was performed
using the standard protocol following bolus administration of
intravenous contrast.
CONTRAST:  100mL OMNIPAQUE IOHEXOL 300 MG/ML  SOLN

[Series 2: axial st · axial · 0.64mm/px · z∈[-580,-205]mm · 12 of 85 slices shown, 14 images]
[im 5/85  soft-tissue]
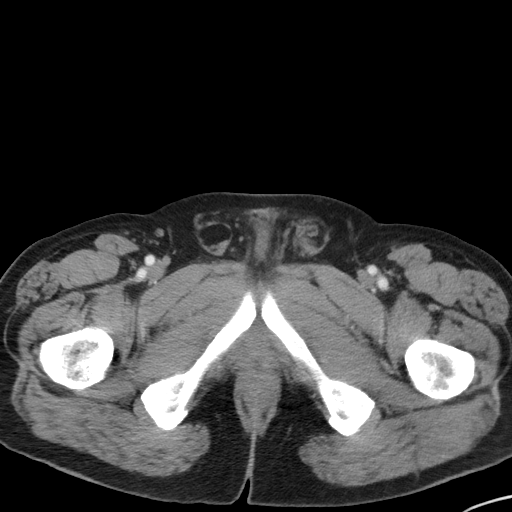
[im 5/85  bone]
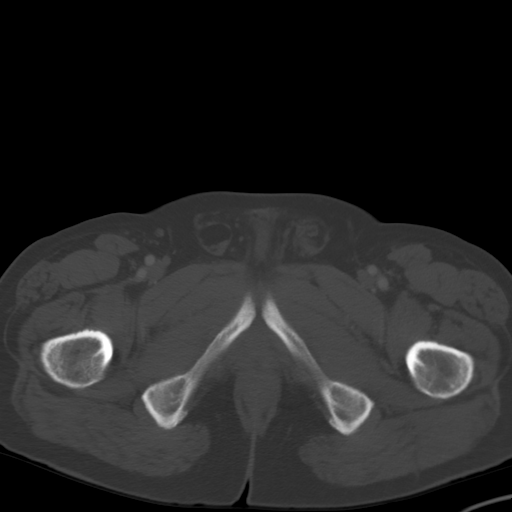
[im 15/85  soft-tissue]
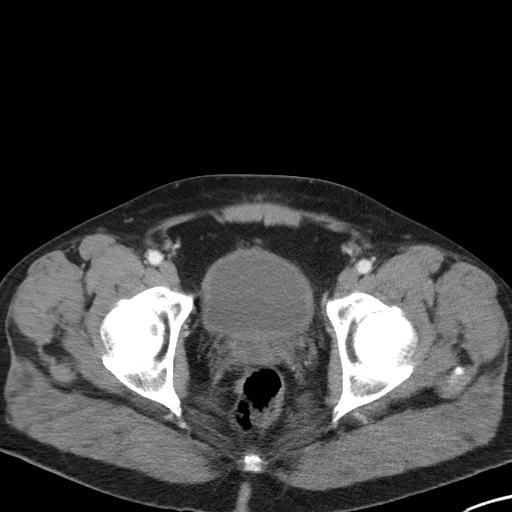
[im 20/85  soft-tissue]
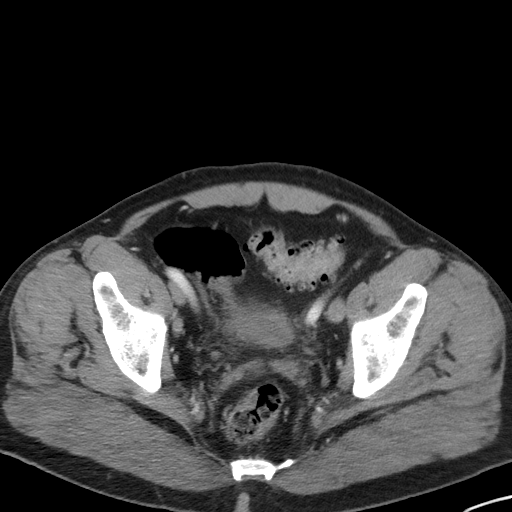
[im 25/85  soft-tissue]
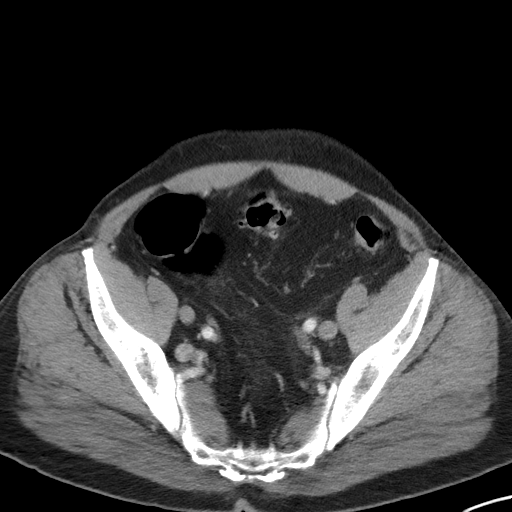
[im 35/85  soft-tissue]
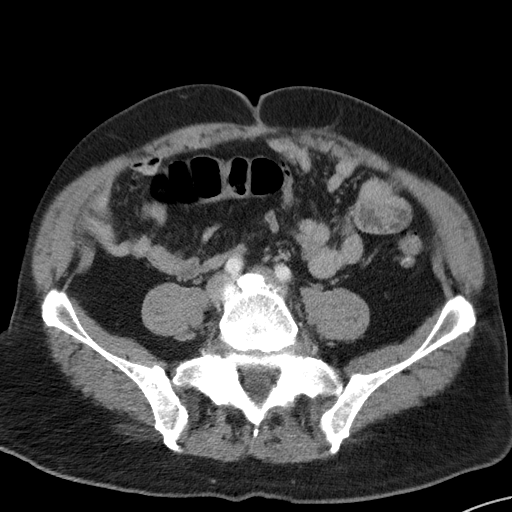
[im 40/85  soft-tissue]
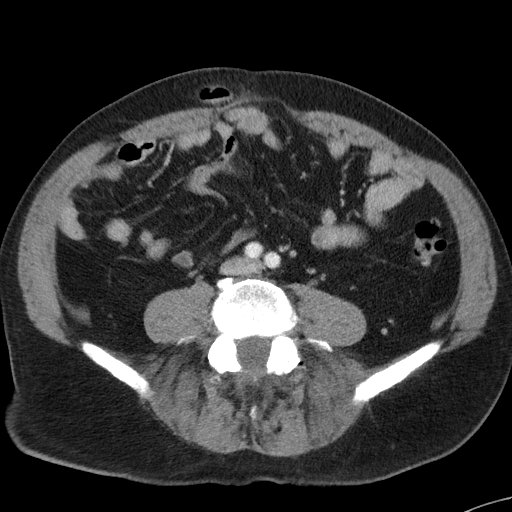
[im 45/85  soft-tissue]
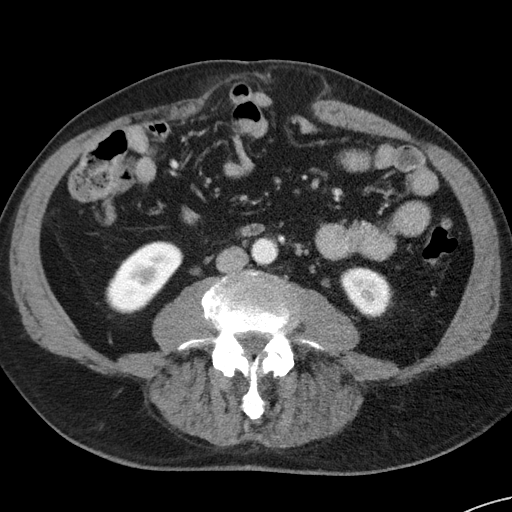
[im 55/85  soft-tissue]
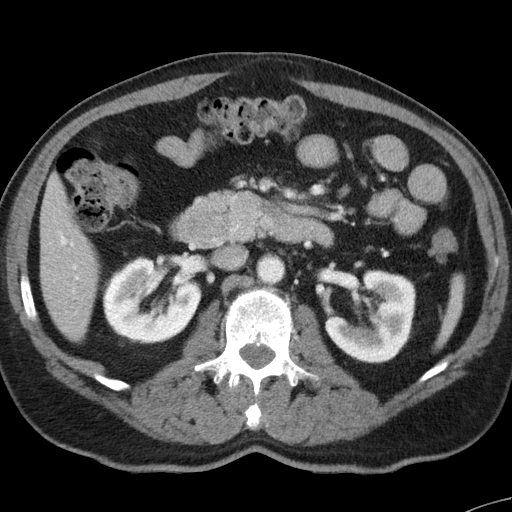
[im 60/85  soft-tissue]
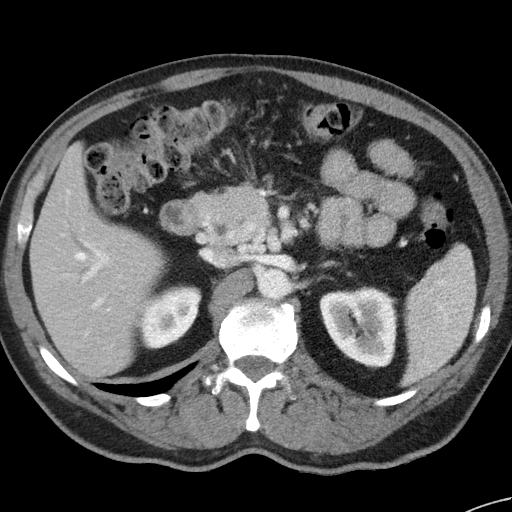
[im 60/85  bone]
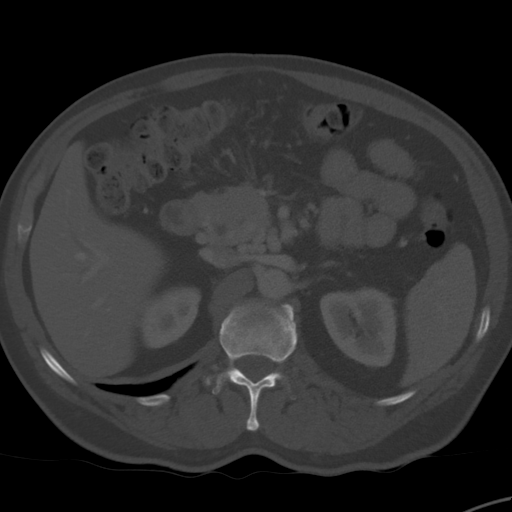
[im 65/85  soft-tissue]
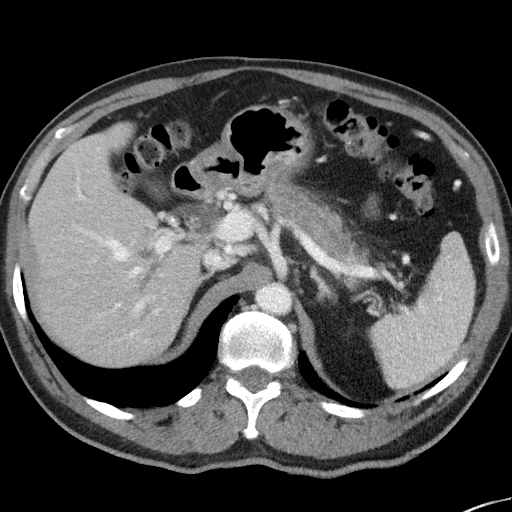
[im 75/85  soft-tissue]
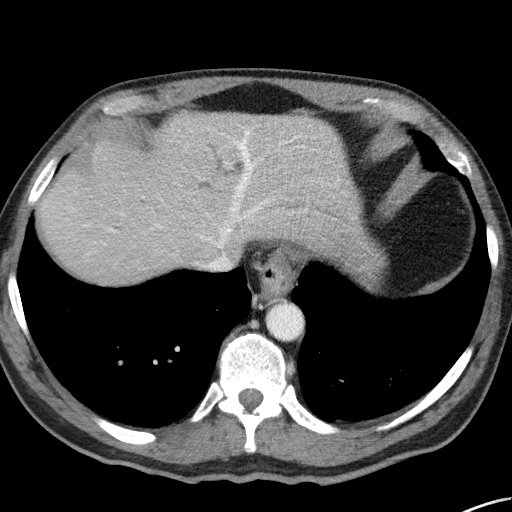
[im 80/85  soft-tissue]
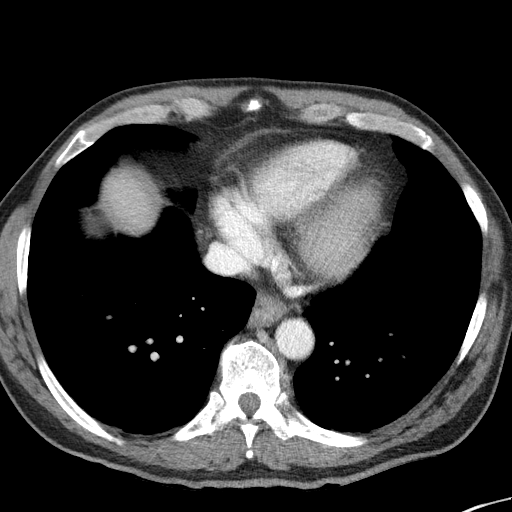

[Series 5: coronal st · coronal · 0.74mm/px · 3 of 91 slices shown]
[im 31/91  soft-tissue]
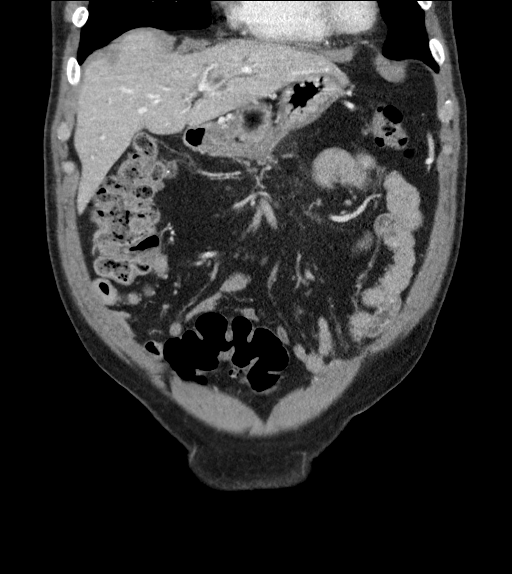
[im 41/91  soft-tissue]
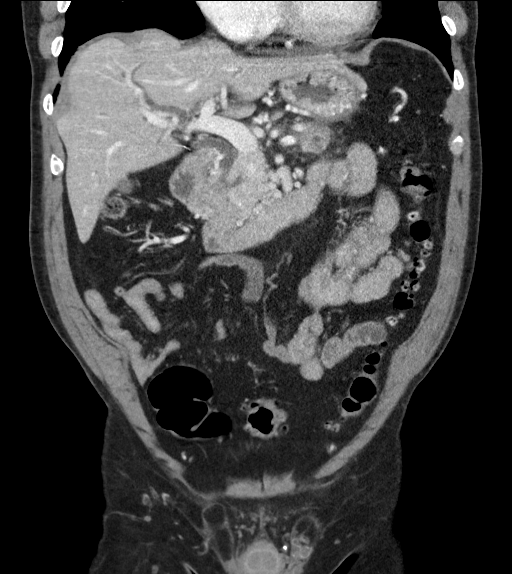
[im 51/91  soft-tissue]
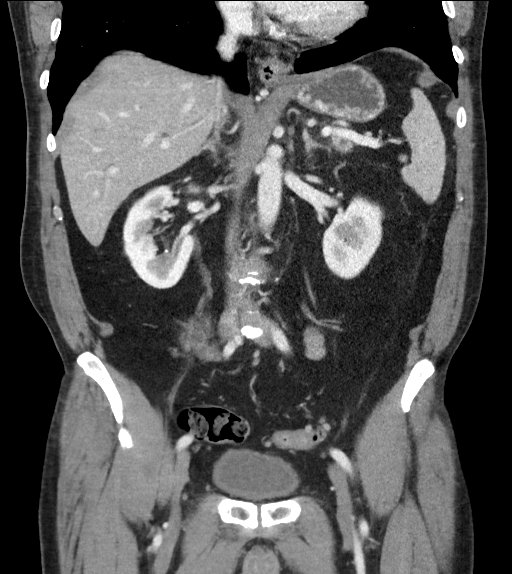

[15 of 46 positions shown; findings below may reference images not displayed]

FINDINGS: Lower chest: No acute abnormality.

Hepatobiliary: No focal liver abnormality is identified. Gallbladder
is absent. Stable chronic intrahepatic bile duct dilatation. Common
bile duct also appears stable in size and configuration.

Pancreas: Stable appearance of the pancreas, with atrophy of the
pancreatic body and tail with associated pancreatic duct dilatation.
Configuration of the duodenum and pancreatic head again suggest
annular pancreas, stable.

No peripancreatic fluid. No pseudocyst formation. Several pancreatic
calcifications again noted.

Spleen: Normal in size without focal abnormality.

Adrenals/Urinary Tract: Adrenal glands are unremarkable. Several
small renal stones bilaterally. Kidneys otherwise unremarkable
without suspicious mass or hydronephrosis. No ureteral or bladder
calculi. Bladder is decompressed.

Stomach/Bowel: No dilated large or small bowel loops. Fairly
extensive diverticulosis throughout the colon, most prominent within
the sigmoid and descending colon but no focal inflammatory change to
suggest acute diverticulitis.

Anterior abdominal wall hernia which contains a focal segment of the
small bowel but no obstruction or inflammation at the hernia site.

Thickening of the walls of the stomach pylorus and/or duodenal bulb,
stable compared to the previous exam.

Vascular/Lymphatic: Upper abdominal and paraesophageal varices. No
acute appearing vascular abnormality. No enlarged lymph nodes are
seen in the abdomen or pelvis.

Reproductive: Prostate is unremarkable.

Other: No free fluid or abscess collection. No free intraperitoneal
air.

Musculoskeletal: No acute or suspicious osseous finding.
Degenerative spondylosis of the lower lumbar spine, mild to moderate
in degree.
IMPRESSION: 1. Thickening of the walls of the distal stomach and duodenal bulb,
similar to previous CT abdomen of 11/27/2018, suggesting recurrent
or chronic gastritis/duodenitis. Consider endoscopic evaluation to
ensure benignity.
2. No bowel obstruction. No evidence of acute pancreatitis. No free
fluid or abscess collection. No hydronephrosis or ureteral calculi.
3. Anterior abdominal wall hernia which contains a focal segment of
the small bowel but no obstruction or inflammation at the hernia
site.
4. Colonic diverticulosis without evidence of acute diverticulitis.
5. Chronic intrahepatic bile duct dilatation, as previously
described. Stable appearance of the atrophic pancreatic body and
tail with associated pancreatic duct dilatation. Configuration of
the duodenum and pancreatic head again suggests associated annular
pancreas, stable.
6. Upper abdominal and paraesophageal varices.
7. Bilateral nephrolithiasis.

## 2022-10-22 DIAGNOSIS — M25511 Pain in right shoulder: Secondary | ICD-10-CM | POA: Diagnosis not present

## 2022-10-23 ENCOUNTER — Emergency Department (HOSPITAL_COMMUNITY)
Admission: EM | Admit: 2022-10-23 | Discharge: 2022-10-23 | Disposition: A | Discharge status: Home / Self Care | Payer: Medicare Other | Attending: Emergency Medicine | Admitting: Emergency Medicine

## 2022-10-23 ENCOUNTER — Other Ambulatory Visit: Payer: Self-pay

## 2022-10-23 ENCOUNTER — Emergency Department (HOSPITAL_COMMUNITY): Payer: Medicare Other

## 2022-10-23 ENCOUNTER — Encounter (HOSPITAL_COMMUNITY): Payer: Self-pay

## 2022-10-23 DIAGNOSIS — J45909 Unspecified asthma, uncomplicated: Secondary | ICD-10-CM | POA: Diagnosis not present

## 2022-10-23 DIAGNOSIS — R112 Nausea with vomiting, unspecified: Secondary | ICD-10-CM | POA: Insufficient documentation

## 2022-10-23 DIAGNOSIS — K861 Other chronic pancreatitis: Secondary | ICD-10-CM | POA: Diagnosis not present

## 2022-10-23 DIAGNOSIS — I10 Essential (primary) hypertension: Secondary | ICD-10-CM | POA: Insufficient documentation

## 2022-10-23 DIAGNOSIS — K429 Umbilical hernia without obstruction or gangrene: Secondary | ICD-10-CM | POA: Diagnosis not present

## 2022-10-23 DIAGNOSIS — E039 Hypothyroidism, unspecified: Secondary | ICD-10-CM | POA: Diagnosis not present

## 2022-10-23 DIAGNOSIS — K5730 Diverticulosis of large intestine without perforation or abscess without bleeding: Secondary | ICD-10-CM | POA: Diagnosis not present

## 2022-10-23 DIAGNOSIS — R1033 Periumbilical pain: Secondary | ICD-10-CM | POA: Diagnosis not present

## 2022-10-23 DIAGNOSIS — Z7982 Long term (current) use of aspirin: Secondary | ICD-10-CM | POA: Diagnosis not present

## 2022-10-23 DIAGNOSIS — R109 Unspecified abdominal pain: Secondary | ICD-10-CM | POA: Diagnosis not present

## 2022-10-23 DIAGNOSIS — N200 Calculus of kidney: Secondary | ICD-10-CM | POA: Diagnosis not present

## 2022-10-23 DIAGNOSIS — K859 Acute pancreatitis without necrosis or infection, unspecified: Secondary | ICD-10-CM | POA: Diagnosis not present

## 2022-10-23 DIAGNOSIS — Z299 Encounter for prophylactic measures, unspecified: Secondary | ICD-10-CM | POA: Diagnosis not present

## 2022-10-23 LAB — COMPREHENSIVE METABOLIC PANEL
ALT: 26 U/L (ref 0–44)
AST: 14 U/L — ABNORMAL LOW (ref 15–41)
Albumin: 4.5 g/dL (ref 3.5–5.0)
Alkaline Phosphatase: 83 U/L (ref 38–126)
Anion gap: 10 (ref 5–15)
BUN: 14 mg/dL (ref 8–23)
CO2: 26 mmol/L (ref 22–32)
Calcium: 9.3 mg/dL (ref 8.9–10.3)
Chloride: 101 mmol/L (ref 98–111)
Creatinine, Ser: 0.83 mg/dL (ref 0.61–1.24)
GFR, Estimated: >60 mL/min (ref >60)
Glucose, Bld: 201 mg/dL — ABNORMAL HIGH (ref 70–99)
Potassium: 3.4 mmol/L — ABNORMAL LOW (ref 3.5–5.1)
Sodium: 137 mmol/L (ref 135–145)
Total Bilirubin: 0.7 mg/dL (ref 0.3–1.2)
Total Protein: 8.1 g/dL (ref 6.5–8.1)

## 2022-10-23 LAB — URINALYSIS, ROUTINE W REFLEX MICROSCOPIC
Bilirubin Urine: NEGATIVE
Glucose, UA: NEGATIVE mg/dL
Hgb urine dipstick: NEGATIVE
Ketones, ur: NEGATIVE mg/dL
Leukocytes,Ua: NEGATIVE
Nitrite: NEGATIVE
Protein, ur: NEGATIVE mg/dL
Specific Gravity, Urine: 1.02 (ref 1.005–1.030)
pH: 6 (ref 5.0–8.0)

## 2022-10-23 LAB — CBC
HCT: 48 % (ref 39.0–52.0)
Hemoglobin: 15.8 g/dL (ref 13.0–17.0)
MCH: 29.4 pg (ref 26.0–34.0)
MCHC: 32.9 g/dL (ref 30.0–36.0)
MCV: 89.4 fL (ref 80.0–100.0)
Platelets: 207 10*3/uL (ref 150–400)
RBC: 5.37 MIL/uL (ref 4.22–5.81)
RDW: 15.2 % (ref 11.5–15.5)
WBC: 7.5 10*3/uL (ref 4.0–10.5)
nRBC: 0 % (ref 0.0–0.2)

## 2022-10-23 LAB — LIPASE, BLOOD: Lipase: 31 U/L (ref 11–51)

## 2022-10-23 MED ORDER — ONDANSETRON HCL 4 MG/2ML IJ SOLN
4.0000 mg | Freq: Once | INTRAMUSCULAR | Status: AC
  Administered 2022-10-23: 4 mg via INTRAVENOUS
  Filled 2022-10-23: qty 2

## 2022-10-23 MED ORDER — PROMETHAZINE HCL 25 MG PO TABS: 25.0000 mg | ORAL_TABLET | Freq: Four times a day (QID) | ORAL | 0 refills | Status: DC | PRN

## 2022-10-23 MED ORDER — ONDANSETRON HCL 4 MG/2ML IJ SOLN: 4.0000 mg | Freq: Once | INTRAMUSCULAR | Status: DC

## 2022-10-23 MED ORDER — FENTANYL CITRATE PF 50 MCG/ML IJ SOSY
25.0000 ug | PREFILLED_SYRINGE | Freq: Once | INTRAMUSCULAR | Status: AC
  Administered 2022-10-23: 25 ug via INTRAVENOUS
  Filled 2022-10-23: qty 1

## 2022-10-23 MED ORDER — MORPHINE SULFATE (PF) 4 MG/ML IV SOLN
4.0000 mg | Freq: Once | INTRAVENOUS | Status: AC
  Administered 2022-10-23: 4 mg via INTRAVENOUS
  Filled 2022-10-23: qty 1

## 2022-10-23 MED ORDER — SODIUM CHLORIDE 0.9 % IV SOLN
12.5000 mg | Freq: Once | INTRAVENOUS | Status: AC
  Administered 2022-10-23: 12.5 mg via INTRAVENOUS
  Filled 2022-10-23: qty 0.5

## 2022-10-23 MED ORDER — IOHEXOL 300 MG/ML  SOLN
100.0000 mL | Freq: Once | INTRAMUSCULAR | Status: AC | PRN
  Administered 2022-10-23: 100 mL via INTRAVENOUS

## 2022-10-23 MED ORDER — PROMETHAZINE HCL 25 MG/ML IJ SOLN
INTRAMUSCULAR | Status: AC
  Filled 2022-10-23: qty 1

## 2022-10-23 MED ORDER — OXYCODONE-ACETAMINOPHEN 5-325 MG PO TABS: 1.0000 | ORAL_TABLET | Freq: Four times a day (QID) | ORAL | 0 refills | Status: AC | PRN

## 2022-10-23 NOTE — ED Triage Notes (Signed)
Pt with hx of pancreatitis sent to ER by PCP for abd pain and vomiting.

## 2022-10-23 NOTE — ED Notes (Addendum)
Patient transported to CT 

## 2022-10-23 NOTE — ED Notes (Signed)
Discharge instructions provided by edp were discussed with pt and s/o at bedside. Pt was able to verbalized understanding with no additional questions at this time. Pt ambulatory to car accompanied by this RN. Pt going home with S/O.

## 2022-10-23 NOTE — Discharge Instructions (Addendum)
Your CT scan showed hernias around the umbilical area. Please take your medications as prescribed. Take tylenol/ibuprofen or Percocet as needed for pain. I recommend close follow-up with PCP for reevaluation.  Please do not hesitate to return to emergency department if worrisome signs symptoms we discussed become apparent.

## 2022-10-23 NOTE — ED Provider Notes (Signed)
[place] EMERGENCY DEPARTMENT AT [facility] Provider Note   CSN: [id number] Arrival date & time: 10/23/22  1534     History {Add pertinent medical, surgical, social history, OB history to HPI:1} Chief Complaint  Patient presents with   Abdominal Pain    Justin [name] is a 67 y.o. male with medical history significant for chronic pancreatitis, GERD, hypertension presents to the emergency department for evaluation of abdominal pain, nausea and vomiting.  Patient reports that he has had the symptoms for the last 3 days.  They have progressively gotten worse.  Pain is located in the periumbilical area and radiating to all over his belly.  Patient reports a few episodes of nonbloody nonbilious vomiting at home.  He is also having constipation.  States he has been seeing blood in his stool likely due to his hemorrhoid and this is not new for him.  No other sick contacts.  Denies any alcohol use.  Patient does have a history of multiple abdominal surgeries due to cysts in his pancreas and hernia.   Abdominal Pain     Past Medical History:  Diagnosis Date   Alcoholism (HCC)    Asthma    Back pain    Fibromyalgia    GERD (gastroesophageal reflux disease)    High cholesterol    Hypertension    Hypothyroidism    Myocardial infarct [facility])    cardiologist in town   Pancreatitis    with pseudocysts   Pneumonia    Stroke [facility])    right leg weakness   Past Surgical History:  Procedure Laterality Date   BACK SURGERY     BIOPSY  03/22/2020   Procedure: BIOPSY;  Surgeon: Lanelle Bal, DO;  Location: [facility] ENDO SUITE;  Service: Endoscopy;;   CHOLECYSTECTOMY  2009   cystogastrostomy  05/2013   ex laparotomy at [facility]   ESOPHAGOGASTRODUODENOSCOPY (EGD) WITH PROPOFOL N/A 03/22/2020   Procedure: ESOPHAGOGASTRODUODENOSCOPY (EGD) WITH PROPOFOL;  Surgeon: Lanelle Bal, DO;  Location: [facility] END[facility]ITE;  Service: Endoscopy;  Laterality: N/A;   FINGER SURGERY     I & D EXTREMITY  Bilateral 11/10/2017   Procedure: IRRIGATION AND DEBRIDEMENT, BILATERAL UPPER HANDS AND RIGHT FOOT. REVISION AMPUTATION RIGHT RING FINGER.;  Surgeon: Dominica Severin, MD;  Location: [facility] OR;  Service: Orthopedics;  Laterality: Bilateral;   I & D EXTREMITY Bilateral 11/12/2017   Procedure: IRRIGATION AND DEBRIDEMENT RIGHT AND LEFT HANDS;  Surgeon: Dominica Severin, MD;  Location: [facility] OR;  Service: Orthopedics;  Laterality: Bilateral;   INCISION AND DRAINAGE HIP Right 11/12/2017   Procedure: IRRIGATION AND DEBRIDEMENT RIGHT FOOT;  Surgeon: Dominica Severin, MD;  Location: [facility] OR;  Service: Orthopedics;  Laterality: Right;   PANCREATIC PSEUDOCYST DRAINAGE  2014     Home Medications Prior to Admission medications   Medication Sig Start Date End Date Taking? Authorizing Provider  acetaminophen (TYLENOL) 325 MG tablet Take 2 tablets (650 mg total) by mouth every 6 (six) hours as needed for mild pain or headache (or Fever >/= 101). 03/25/20   Vassie Loll, MD  albuterol (VENTOLIN HFA) 108 (90 Base) MCG/ACT inhaler Inhale 1-2 puffs into the lungs every 6 (six) hours as needed for wheezing or shortness of breath.    [provider]  aspirin 81 MG chewable tablet Chew 81 mg by mouth daily.     [provider]  B Complex Vitamins (VITAMIN B COMPLEX PO) Take by mouth.    [provider]  baclofen (LIORESAL) 10  MG tablet Take 10 mg by mouth 3 (three) times daily as needed for muscle spasms.  11/08/17   [provider]  Cholecalciferol (VITAMIN D3) 1000 units CAPS Take 1,000 Units by mouth.     [provider]  clonazePAM (KLONOPIN) 0.5 MG tablet Take 0.5 mg by mouth at bedtime. *May take one tablet three times daily as needed for anxiety* 12/14/14   [provider]  DULoxetine (CYMBALTA) 60 MG capsule Take 60 mg by mouth every morning.    [provider]  Fenofibric Acid 105 MG TABS Take 105 mg by mouth at bedtime.  12/28/14   [provider]  Flax  Oil-Fish Oil-Borage Oil CAPS Take 1 capsule by mouth daily.    [provider]  folic acid (FOLVITE) 1 MG tablet Take 1 mg by mouth 2 (two) times daily. 01/21/20   [provider]  gabapentin (NEURONTIN) 600 MG tablet Take 600 mg by mouth 3 (three) times daily.    [provider]  levothyroxine (SYNTHROID, LEVOTHROID) 112 MCG tablet Take 112 mcg by mouth every morning.     [provider]  methotrexate 2.5 MG tablet Take 15 mg by mouth once a week. 03/20/20   [provider]  Multiple Vitamins-Minerals (MULTIVITAMIN ADULT PO) Take 1 tablet by mouth daily.    [provider]  ondansetron (ZOFRAN ODT) 8 MG disintegrating tablet Take 1 tablet (8 mg total) by mouth every 8 (eight) hours as needed for nausea or vomiting. 03/25/20   Vassie Loll, MD  Pancrelipase, Lip-Prot-Amyl, 6000-19000 units CPEP Take 2-4 capsules by mouth 5 (five) times daily. Patient takes 4 capsules by mouth three times a day with meals and 2 capsules by mouth twice a day with snacks    [provider]  pantoprazole (PROTONIX) 40 MG tablet Take 1 tablet (40 mg total) by mouth 2 (two) times daily. 03/25/20   Vassie Loll, MD  polyethylene glycol [facility] / Ethelene Hal) packet Take 17 g by mouth daily as needed for mild constipation.  07/19/12   [provider]  tamsulosin (FLOMAX) 0.4 MG CAPS capsule Take 0.4 mg by mouth 2 (two) times daily.  12/28/14   [provider]      Allergies    Amoxicillin, Dilaudid [hydromorphone hcl], and Hydromorphone    Review of Systems   Review of Systems  Gastrointestinal:  Positive for abdominal pain.    Physical Exam Updated Vital Signs BP (!) 125/91 (BP Location: Left Arm)   Pulse 85   Temp 98.8 F (37.1 C) (Oral)   Resp (!) 28   Ht 5\' 7"  (1.702 m)   Wt 67.1 kg   SpO2 99%   BMI 23.18 kg/m  Physical Exam Vitals and nursing note reviewed.  Constitutional:      Appearance: Normal appearance.  HENT:      Head: Normocephalic and atraumatic.     Mouth/Throat:     Mouth: Mucous membranes are moist.  Eyes:     General: No scleral icterus. Cardiovascular:     Rate and Rhythm: Normal rate and regular rhythm.     Pulses: Normal pulses.     Heart sounds: Normal heart sounds.  Pulmonary:     Effort: Pulmonary effort is normal.     Breath sounds: Normal breath sounds.  Abdominal:     General: Abdomen is flat.     Palpations: Abdomen is soft.     Tenderness: There is generalized abdominal tenderness.  Musculoskeletal:  General: No deformity.  Skin:    General: Skin is warm.     Findings: No rash.  Neurological:     General: No focal deficit present.     Mental Status: He is alert.  Psychiatric:        Mood and Affect: Mood normal.     ED Results / Procedures / Treatments   Labs (all labs ordered are listed, but only abnormal results are displayed) Labs Reviewed  COMPREHENSIVE METABOLIC PANEL - Abnormal; Notable for the following components:      Result Value   Potassium 3.4 (*)    Glucose, Bld 201 (*)    AST 14 (*)    All other components within normal limits  LIPASE, BLOOD  CBC  URINALYSIS, ROUTINE W REFLEX MICROSCOPIC    EKG None  Radiology No results found.  Procedures Procedures  {Document cardiac monitor, telemetry assessment procedure when appropriate:1}  Medications Ordered in ED Medications - No data to display  ED Course/ Medical Decision Making/ A&P   {   Click here for ABCD2, HEART and other calculatorsREFRESH Note before signing :1}                              Medical Decision Making Amount and/or Complexity of Data Reviewed Labs: ordered.   ***  {Document critical care time when appropriate:1} {Document review of labs and clinical decision tools ie heart score, Chads2Vasc2 etc:1}  {Document your independent review of radiology images, and any outside records:1} {Document your discussion with family members, caretakers, and with  consultants:1} {Document social determinants of health affecting pt's care:1} {Document your decision making why or why not admission, treatments were needed:1} Final Clinical Impression(s) / ED Diagnoses Final diagnoses:  None    Rx / DC Orders ED Discharge Orders     None

## 2022-10-30 ENCOUNTER — Telehealth: Payer: Self-pay

## 2022-10-30 NOTE — Telephone Encounter (Signed)
Transition Care Management Unsuccessful Follow-up Telephone Call  Date of discharge and from where:  10/23/2022 [facility]  Attempts:  1st Attempt  Reason for unsuccessful TCM follow-up call:  Unable to leave message  Trimaine Maser [facility]  [facility] Population Health Community Resource Care Guide   [email or web address]  ?? [phone]   Website: [email or web address]

## 2022-10-30 NOTE — Telephone Encounter (Signed)
Transition Care Management Unsuccessful Follow-up Telephone Call  Date of discharge and from where:  10/23/2022 [facility]  Attempts:  2nd Attempt  Reason for unsuccessful TCM follow-up call:  Unable to leave message  Regnia Mathwig [facility]  [facility] Population Health Community Resource Care Guide   [email or web address]  ?? [phone]   Website: [email or web address]

## 2022-10-31 DIAGNOSIS — M75121 Complete rotator cuff tear or rupture of right shoulder, not specified as traumatic: Secondary | ICD-10-CM | POA: Diagnosis not present

## 2022-11-02 DIAGNOSIS — Z299 Encounter for prophylactic measures, unspecified: Secondary | ICD-10-CM | POA: Diagnosis not present

## 2022-11-02 DIAGNOSIS — E1165 Type 2 diabetes mellitus with hyperglycemia: Secondary | ICD-10-CM | POA: Diagnosis not present

## 2022-11-02 DIAGNOSIS — I10 Essential (primary) hypertension: Secondary | ICD-10-CM | POA: Diagnosis not present

## 2022-11-02 DIAGNOSIS — M25512 Pain in left shoulder: Secondary | ICD-10-CM | POA: Diagnosis not present

## 2022-11-15 DIAGNOSIS — K861 Other chronic pancreatitis: Secondary | ICD-10-CM | POA: Diagnosis not present

## 2022-11-15 DIAGNOSIS — R1013 Epigastric pain: Secondary | ICD-10-CM | POA: Diagnosis not present

## 2022-11-20 DIAGNOSIS — M19011 Primary osteoarthritis, right shoulder: Secondary | ICD-10-CM | POA: Diagnosis not present

## 2022-11-24 DIAGNOSIS — Z299 Encounter for prophylactic measures, unspecified: Secondary | ICD-10-CM | POA: Diagnosis not present

## 2022-11-24 DIAGNOSIS — Z01818 Encounter for other preprocedural examination: Secondary | ICD-10-CM | POA: Diagnosis not present

## 2022-11-24 DIAGNOSIS — E1140 Type 2 diabetes mellitus with diabetic neuropathy, unspecified: Secondary | ICD-10-CM | POA: Diagnosis not present

## 2022-11-24 DIAGNOSIS — I10 Essential (primary) hypertension: Secondary | ICD-10-CM | POA: Diagnosis not present

## 2022-11-27 DIAGNOSIS — E119 Type 2 diabetes mellitus without complications: Secondary | ICD-10-CM | POA: Diagnosis not present

## 2022-12-14 DIAGNOSIS — R1013 Epigastric pain: Secondary | ICD-10-CM | POA: Diagnosis not present

## 2022-12-14 DIAGNOSIS — K861 Other chronic pancreatitis: Secondary | ICD-10-CM | POA: Diagnosis not present

## 2022-12-29 DIAGNOSIS — Z96611 Presence of right artificial shoulder joint: Secondary | ICD-10-CM | POA: Diagnosis not present

## 2022-12-29 DIAGNOSIS — M19011 Primary osteoarthritis, right shoulder: Secondary | ICD-10-CM | POA: Diagnosis not present

## 2022-12-29 DIAGNOSIS — M25511 Pain in right shoulder: Secondary | ICD-10-CM | POA: Diagnosis not present

## 2022-12-29 DIAGNOSIS — Z471 Aftercare following joint replacement surgery: Secondary | ICD-10-CM | POA: Diagnosis not present

## 2022-12-29 DIAGNOSIS — I890 Lymphedema, not elsewhere classified: Secondary | ICD-10-CM | POA: Diagnosis not present

## 2022-12-29 DIAGNOSIS — M75121 Complete rotator cuff tear or rupture of right shoulder, not specified as traumatic: Secondary | ICD-10-CM | POA: Diagnosis not present

## 2022-12-29 DIAGNOSIS — G8918 Other acute postprocedural pain: Secondary | ICD-10-CM | POA: Diagnosis not present

## 2023-01-09 DIAGNOSIS — Z4731 Aftercare following explantation of shoulder joint prosthesis: Secondary | ICD-10-CM | POA: Diagnosis not present

## 2023-01-31 DIAGNOSIS — M25511 Pain in right shoulder: Secondary | ICD-10-CM | POA: Diagnosis not present

## 2023-01-31 DIAGNOSIS — M25611 Stiffness of right shoulder, not elsewhere classified: Secondary | ICD-10-CM | POA: Diagnosis not present

## 2023-02-14 DIAGNOSIS — M069 Rheumatoid arthritis, unspecified: Secondary | ICD-10-CM | POA: Diagnosis not present

## 2023-02-14 DIAGNOSIS — M25512 Pain in left shoulder: Secondary | ICD-10-CM | POA: Diagnosis not present

## 2023-02-14 DIAGNOSIS — I10 Essential (primary) hypertension: Secondary | ICD-10-CM | POA: Diagnosis not present

## 2023-02-14 DIAGNOSIS — Z299 Encounter for prophylactic measures, unspecified: Secondary | ICD-10-CM | POA: Diagnosis not present

## 2023-02-14 DIAGNOSIS — E1169 Type 2 diabetes mellitus with other specified complication: Secondary | ICD-10-CM | POA: Diagnosis not present

## 2023-02-14 DIAGNOSIS — R52 Pain, unspecified: Secondary | ICD-10-CM | POA: Diagnosis not present

## 2023-02-14 DIAGNOSIS — B372 Candidiasis of skin and nail: Secondary | ICD-10-CM | POA: Diagnosis not present

## 2023-02-16 DIAGNOSIS — M25511 Pain in right shoulder: Secondary | ICD-10-CM | POA: Diagnosis not present

## 2023-02-16 DIAGNOSIS — M25611 Stiffness of right shoulder, not elsewhere classified: Secondary | ICD-10-CM | POA: Diagnosis not present

## 2023-03-26 DIAGNOSIS — Z96611 Presence of right artificial shoulder joint: Secondary | ICD-10-CM | POA: Diagnosis not present

## 2023-05-22 DIAGNOSIS — E1169 Type 2 diabetes mellitus with other specified complication: Secondary | ICD-10-CM | POA: Diagnosis not present

## 2023-05-22 DIAGNOSIS — Z299 Encounter for prophylactic measures, unspecified: Secondary | ICD-10-CM | POA: Diagnosis not present

## 2023-05-22 DIAGNOSIS — I10 Essential (primary) hypertension: Secondary | ICD-10-CM | POA: Diagnosis not present

## 2023-05-22 DIAGNOSIS — K551 Chronic vascular disorders of intestine: Secondary | ICD-10-CM | POA: Diagnosis not present

## 2023-05-22 DIAGNOSIS — B372 Candidiasis of skin and nail: Secondary | ICD-10-CM | POA: Diagnosis not present

## 2023-05-22 DIAGNOSIS — D692 Other nonthrombocytopenic purpura: Secondary | ICD-10-CM | POA: Diagnosis not present

## 2023-06-15 ENCOUNTER — Inpatient Hospital Stay (HOSPITAL_COMMUNITY)
Admission: EM | Admit: 2023-06-15 | Discharge: 2023-06-23 | DRG: 439 | Disposition: A | Discharge status: Home / Self Care | Attending: Family Medicine | Admitting: Family Medicine

## 2023-06-15 ENCOUNTER — Other Ambulatory Visit: Payer: Self-pay

## 2023-06-15 ENCOUNTER — Emergency Department (HOSPITAL_COMMUNITY)

## 2023-06-15 ENCOUNTER — Encounter (HOSPITAL_COMMUNITY): Payer: Self-pay | Admitting: *Deleted

## 2023-06-15 DIAGNOSIS — E039 Hypothyroidism, unspecified: Secondary | ICD-10-CM | POA: Diagnosis not present

## 2023-06-15 DIAGNOSIS — Z880 Allergy status to penicillin: Secondary | ICD-10-CM

## 2023-06-15 DIAGNOSIS — N200 Calculus of kidney: Secondary | ICD-10-CM | POA: Diagnosis not present

## 2023-06-15 DIAGNOSIS — I252 Old myocardial infarction: Secondary | ICD-10-CM

## 2023-06-15 DIAGNOSIS — Z8673 Personal history of transient ischemic attack (TIA), and cerebral infarction without residual deficits: Secondary | ICD-10-CM

## 2023-06-15 DIAGNOSIS — G8929 Other chronic pain: Secondary | ICD-10-CM | POA: Diagnosis not present

## 2023-06-15 DIAGNOSIS — K219 Gastro-esophageal reflux disease without esophagitis: Secondary | ICD-10-CM | POA: Diagnosis present

## 2023-06-15 DIAGNOSIS — Z9049 Acquired absence of other specified parts of digestive tract: Secondary | ICD-10-CM | POA: Diagnosis not present

## 2023-06-15 DIAGNOSIS — F17210 Nicotine dependence, cigarettes, uncomplicated: Secondary | ICD-10-CM | POA: Diagnosis not present

## 2023-06-15 DIAGNOSIS — E781 Pure hyperglyceridemia: Secondary | ICD-10-CM | POA: Diagnosis not present

## 2023-06-15 DIAGNOSIS — K439 Ventral hernia without obstruction or gangrene: Secondary | ICD-10-CM | POA: Diagnosis not present

## 2023-06-15 DIAGNOSIS — K5730 Diverticulosis of large intestine without perforation or abscess without bleeding: Secondary | ICD-10-CM | POA: Diagnosis not present

## 2023-06-15 DIAGNOSIS — Z79890 Hormone replacement therapy (postmenopausal): Secondary | ICD-10-CM | POA: Diagnosis not present

## 2023-06-15 DIAGNOSIS — M797 Fibromyalgia: Secondary | ICD-10-CM | POA: Diagnosis present

## 2023-06-15 DIAGNOSIS — L299 Pruritus, unspecified: Secondary | ICD-10-CM | POA: Diagnosis present

## 2023-06-15 DIAGNOSIS — I25810 Atherosclerosis of coronary artery bypass graft(s) without angina pectoris: Secondary | ICD-10-CM | POA: Diagnosis present

## 2023-06-15 DIAGNOSIS — K5903 Drug induced constipation: Secondary | ICD-10-CM | POA: Diagnosis present

## 2023-06-15 DIAGNOSIS — E7800 Pure hypercholesterolemia, unspecified: Secondary | ICD-10-CM | POA: Diagnosis present

## 2023-06-15 DIAGNOSIS — K5909 Other constipation: Secondary | ICD-10-CM | POA: Diagnosis present

## 2023-06-15 DIAGNOSIS — Z79899 Other long term (current) drug therapy: Secondary | ICD-10-CM | POA: Diagnosis not present

## 2023-06-15 DIAGNOSIS — K859 Acute pancreatitis without necrosis or infection, unspecified: Secondary | ICD-10-CM | POA: Diagnosis not present

## 2023-06-15 DIAGNOSIS — T402X5A Adverse effect of other opioids, initial encounter: Secondary | ICD-10-CM | POA: Diagnosis not present

## 2023-06-15 DIAGNOSIS — Z7982 Long term (current) use of aspirin: Secondary | ICD-10-CM | POA: Diagnosis not present

## 2023-06-15 DIAGNOSIS — F1011 Alcohol abuse, in remission: Secondary | ICD-10-CM | POA: Diagnosis present

## 2023-06-15 DIAGNOSIS — Z885 Allergy status to narcotic agent status: Secondary | ICD-10-CM

## 2023-06-15 DIAGNOSIS — I2510 Atherosclerotic heart disease of native coronary artery without angina pectoris: Secondary | ICD-10-CM | POA: Diagnosis present

## 2023-06-15 DIAGNOSIS — Z8249 Family history of ischemic heart disease and other diseases of the circulatory system: Secondary | ICD-10-CM

## 2023-06-15 DIAGNOSIS — Z833 Family history of diabetes mellitus: Secondary | ICD-10-CM | POA: Diagnosis not present

## 2023-06-15 DIAGNOSIS — I10 Essential (primary) hypertension: Secondary | ICD-10-CM | POA: Diagnosis not present

## 2023-06-15 DIAGNOSIS — Z299 Encounter for prophylactic measures, unspecified: Secondary | ICD-10-CM | POA: Diagnosis not present

## 2023-06-15 DIAGNOSIS — R748 Abnormal levels of other serum enzymes: Secondary | ICD-10-CM | POA: Diagnosis not present

## 2023-06-15 DIAGNOSIS — I25812 Atherosclerosis of bypass graft of coronary artery of transplanted heart without angina pectoris: Secondary | ICD-10-CM | POA: Diagnosis not present

## 2023-06-15 DIAGNOSIS — K8689 Other specified diseases of pancreas: Secondary | ICD-10-CM | POA: Diagnosis not present

## 2023-06-15 DIAGNOSIS — E782 Mixed hyperlipidemia: Secondary | ICD-10-CM | POA: Diagnosis present

## 2023-06-15 DIAGNOSIS — M069 Rheumatoid arthritis, unspecified: Secondary | ICD-10-CM | POA: Diagnosis not present

## 2023-06-15 DIAGNOSIS — R110 Nausea: Secondary | ICD-10-CM | POA: Diagnosis not present

## 2023-06-15 DIAGNOSIS — K861 Other chronic pancreatitis: Secondary | ICD-10-CM | POA: Diagnosis present

## 2023-06-15 DIAGNOSIS — Z888 Allergy status to other drugs, medicaments and biological substances status: Secondary | ICD-10-CM

## 2023-06-15 DIAGNOSIS — R109 Unspecified abdominal pain: Secondary | ICD-10-CM | POA: Diagnosis not present

## 2023-06-15 LAB — CBC
HCT: 43.2 % (ref 39.0–52.0)
Hemoglobin: 14.4 g/dL (ref 13.0–17.0)
MCH: 28.6 pg (ref 26.0–34.0)
MCHC: 33.3 g/dL (ref 30.0–36.0)
MCV: 85.7 fL (ref 80.0–100.0)
Platelets: 179 10*3/uL (ref 150–400)
RBC: 5.04 MIL/uL (ref 4.22–5.81)
RDW: 13.4 % (ref 11.5–15.5)
WBC: 6.1 10*3/uL (ref 4.0–10.5)
nRBC: 0 % (ref 0.0–0.2)

## 2023-06-15 LAB — URINALYSIS, ROUTINE W REFLEX MICROSCOPIC
Bilirubin Urine: NEGATIVE
Glucose, UA: 150 mg/dL — AB
Hgb urine dipstick: NEGATIVE
Ketones, ur: NEGATIVE mg/dL
Leukocytes,Ua: NEGATIVE
Nitrite: NEGATIVE
Protein, ur: NEGATIVE mg/dL
Specific Gravity, Urine: 1.02 (ref 1.005–1.030)
pH: 5 (ref 5.0–8.0)

## 2023-06-15 LAB — COMPREHENSIVE METABOLIC PANEL WITH GFR
ALT: 16 U/L (ref 0–44)
AST: 13 U/L — ABNORMAL LOW (ref 15–41)
Albumin: 3.9 g/dL (ref 3.5–5.0)
Alkaline Phosphatase: 86 U/L (ref 38–126)
Anion gap: 9 (ref 5–15)
BUN: 23 mg/dL (ref 8–23)
CO2: 22 mmol/L (ref 22–32)
Calcium: 9.3 mg/dL (ref 8.9–10.3)
Chloride: 106 mmol/L (ref 98–111)
Creatinine, Ser: 0.73 mg/dL (ref 0.61–1.24)
GFR, Estimated: >60 mL/min (ref >60)
Glucose, Bld: 169 mg/dL — ABNORMAL HIGH (ref 70–99)
Potassium: 3.8 mmol/L (ref 3.5–5.1)
Sodium: 137 mmol/L (ref 135–145)
Total Bilirubin: 0.6 mg/dL (ref 0.0–1.2)
Total Protein: 7.3 g/dL (ref 6.5–8.1)

## 2023-06-15 LAB — LIPASE, BLOOD: Lipase: 74 U/L — ABNORMAL HIGH (ref 11–51)

## 2023-06-15 MED ORDER — LACTATED RINGERS IV SOLN: INTRAVENOUS | Status: DC

## 2023-06-15 MED ORDER — FENOFIBRATE 160 MG PO TABS
160.0000 mg | ORAL_TABLET | Freq: Every day | ORAL | Status: DC
  Administered 2023-06-16 – 2023-06-22 (×7): 160 mg via ORAL
  Filled 2023-06-15 (×7): qty 1

## 2023-06-15 MED ORDER — ASPIRIN 81 MG PO CHEW
81.0000 mg | CHEWABLE_TABLET | Freq: Every day | ORAL | Status: DC
  Administered 2023-06-16 – 2023-06-23 (×8): 81 mg via ORAL
  Filled 2023-06-15 (×8): qty 1

## 2023-06-15 MED ORDER — ONDANSETRON HCL 4 MG PO TABS: 4.0000 mg | ORAL_TABLET | Freq: Four times a day (QID) | ORAL | Status: DC | PRN

## 2023-06-15 MED ORDER — MORPHINE SULFATE (PF) 4 MG/ML IV SOLN
4.0000 mg | Freq: Once | INTRAVENOUS | Status: AC
  Administered 2023-06-15: 4 mg via INTRAVENOUS
  Filled 2023-06-15: qty 1

## 2023-06-15 MED ORDER — SODIUM CHLORIDE 0.9 % IV SOLN
6.2500 mg | Freq: Once | INTRAVENOUS | Status: AC
  Administered 2023-06-15: 6.25 mg via INTRAVENOUS
  Filled 2023-06-15: qty 0.25

## 2023-06-15 MED ORDER — IOHEXOL 300 MG/ML  SOLN
100.0000 mL | Freq: Once | INTRAMUSCULAR | Status: AC | PRN
  Administered 2023-06-15: 100 mL via INTRAVENOUS

## 2023-06-15 MED ORDER — ENOXAPARIN SODIUM 40 MG/0.4ML IJ SOSY
40.0000 mg | PREFILLED_SYRINGE | INTRAMUSCULAR | Status: DC
  Administered 2023-06-15 – 2023-06-22 (×8): 40 mg via SUBCUTANEOUS
  Filled 2023-06-15 (×8): qty 0.4

## 2023-06-15 MED ORDER — ONDANSETRON HCL 4 MG/2ML IJ SOLN
4.0000 mg | Freq: Four times a day (QID) | INTRAMUSCULAR | Status: DC | PRN
  Administered 2023-06-16 – 2023-06-22 (×6): 4 mg via INTRAVENOUS
  Filled 2023-06-15 (×6): qty 2

## 2023-06-15 MED ORDER — FENTANYL CITRATE PF 50 MCG/ML IJ SOSY
25.0000 ug | PREFILLED_SYRINGE | Freq: Once | INTRAMUSCULAR | Status: AC
  Administered 2023-06-15: 25 ug via INTRAVENOUS
  Filled 2023-06-15: qty 1

## 2023-06-15 MED ORDER — PANTOPRAZOLE SODIUM 40 MG PO TBEC
40.0000 mg | DELAYED_RELEASE_TABLET | Freq: Two times a day (BID) | ORAL | Status: DC
  Administered 2023-06-15 – 2023-06-23 (×16): 40 mg via ORAL
  Filled 2023-06-15 (×16): qty 1

## 2023-06-15 MED ORDER — SODIUM CHLORIDE 0.9 % IV BOLUS
500.0000 mL | Freq: Once | INTRAVENOUS | Status: AC
  Administered 2023-06-15: 500 mL via INTRAVENOUS

## 2023-06-15 MED ORDER — SODIUM CHLORIDE 0.9 % IV BOLUS
1000.0000 mL | Freq: Once | INTRAVENOUS | Status: AC
  Administered 2023-06-15: 1000 mL via INTRAVENOUS

## 2023-06-15 MED ORDER — FENOFIBRIC ACID 105 MG PO TABS: 105.0000 mg | ORAL_TABLET | Freq: Every day | ORAL | Status: DC

## 2023-06-15 MED ORDER — LEVOTHYROXINE SODIUM 112 MCG PO TABS
112.0000 ug | ORAL_TABLET | Freq: Every day | ORAL | Status: DC
  Administered 2023-06-16 – 2023-06-23 (×8): 112 ug via ORAL
  Filled 2023-06-15 (×8): qty 1

## 2023-06-15 MED ORDER — DULOXETINE HCL 60 MG PO CPEP
60.0000 mg | ORAL_CAPSULE | Freq: Every morning | ORAL | Status: DC
  Administered 2023-06-16 – 2023-06-23 (×8): 60 mg via ORAL
  Filled 2023-06-15 (×8): qty 1

## 2023-06-15 MED ORDER — ONDANSETRON HCL 4 MG/2ML IJ SOLN
4.0000 mg | Freq: Once | INTRAMUSCULAR | Status: AC
  Administered 2023-06-15: 4 mg via INTRAVENOUS
  Filled 2023-06-15: qty 2

## 2023-06-15 MED ORDER — TAMSULOSIN HCL 0.4 MG PO CAPS
0.4000 mg | ORAL_CAPSULE | Freq: Two times a day (BID) | ORAL | Status: DC
  Administered 2023-06-15 – 2023-06-23 (×16): 0.4 mg via ORAL
  Filled 2023-06-15 (×16): qty 1

## 2023-06-15 MED ORDER — MORPHINE SULFATE (PF) 4 MG/ML IV SOLN
4.0000 mg | INTRAVENOUS | Status: DC | PRN
  Administered 2023-06-15 – 2023-06-17 (×12): 4 mg via INTRAVENOUS
  Filled 2023-06-15 (×14): qty 1

## 2023-06-15 NOTE — ED Provider Notes (Signed)
 [place] EMERGENCY DEPARTMENT AT [facility] Provider Note   CSN: [id number] Arrival date & time: 06/15/23  1135     History  Chief Complaint  Patient presents with   Abdominal Pain    Justin [name] is a 68 y.o. male.  With past medical history of stroke, coronary artery disease, elevated triglycerides, pancreatitis, alcohol use presenting to emergency room with complaint of abdominal pain.  Patient reports that he has abdominal pain for over 7 days.  Reports this is associated with nausea, no vomiting and 1-2 episodes of loose stool per day.  He reports decreased appetite.  Reports this feels like his prior pancreatitis.  He localizes pain to the middle of abdomen and lower left quadrant.  Denies any fevers or chills, chest pain or shortness of breath.  Was seen by primary care earlier today and sent here for further workup. Requesting pain and nausea medications. Denies EtOH use.    Abdominal Pain      Home Medications Prior to Admission medications   Medication Sig Start Date End Date Taking? Authorizing Provider  acetaminophen (TYLENOL) 325 MG tablet Take 2 tablets (650 mg total) by mouth every 6 (six) hours as needed for mild pain or headache (or Fever >/= 101). 03/25/20   Vassie Loll, MD  albuterol (VENTOLIN HFA) 108 (90 Base) MCG/ACT inhaler Inhale 1-2 puffs into the lungs every 6 (six) hours as needed for wheezing or shortness of breath.    [provider]  aspirin 81 MG chewable tablet Chew 81 mg by mouth daily.     [provider]  B Complex Vitamins (VITAMIN B COMPLEX PO) Take by mouth.    [provider]  baclofen (LIORESAL) 10 MG tablet Take 10 mg by mouth 3 (three) times daily as needed for muscle spasms.  11/08/17   [provider]  Cholecalciferol (VITAMIN D3) 1000 units CAPS Take 1,000 Units by mouth.     [provider]  clonazePAM (KLONOPIN) 0.5 MG tablet Take 0.5 mg by mouth at bedtime. *May take one  tablet three times daily as needed for anxiety* 12/14/14   [provider]  DULoxetine (CYMBALTA) 60 MG capsule Take 60 mg by mouth every morning.    [provider]  Fenofibric Acid 105 MG TABS Take 105 mg by mouth at bedtime.  12/28/14   [provider]  Flax Oil-Fish Oil-Borage Oil CAPS Take 1 capsule by mouth daily.    [provider]  folic acid (FOLVITE) 1 MG tablet Take 1 mg by mouth 2 (two) times daily. 01/21/20   [provider]  gabapentin (NEURONTIN) 600 MG tablet Take 600 mg by mouth 3 (three) times daily.    [provider]  levothyroxine (SYNTHROID, LEVOTHROID) 112 MCG tablet Take 112 mcg by mouth every morning.     [provider]  methotrexate 2.5 MG tablet Take 15 mg by mouth once a week. 03/20/20   [provider]  Multiple Vitamins-Minerals (MULTIVITAMIN ADULT PO) Take 1 tablet by mouth daily.    [provider]  ondansetron (ZOFRAN ODT) 8 MG disintegrating tablet Take 1 tablet (8 mg total) by mouth every 8 (eight) hours as needed for nausea or vomiting. 03/25/20   Vassie Loll, MD  oxyCODONE-acetaminophen (PERCOCET/ROXICET) 5-325 MG tablet Take 1 tablet by mouth every 6 (six) hours as needed for severe pain. 10/23/22   Jeanelle Malling, PA  Pancrelipase, Lip-Prot-Amyl, 6000-19000 units CPEP Take 2-4 capsules by mouth 5 (five) times daily.  Patient takes 4 capsules by mouth three times a day with meals and 2 capsules by mouth twice a day with snacks    [provider]  pantoprazole (PROTONIX) 40 MG tablet Take 1 tablet (40 mg total) by mouth 2 (two) times daily. 03/25/20   Vassie Loll, MD  polyethylene glycol [facility] / Ethelene Hal) packet Take 17 g by mouth daily as needed for mild constipation.  07/19/12   [provider]  promethazine (PHENERGAN) 25 MG tablet Take 1 tablet (25 mg total) by mouth every 6 (six) hours as needed for nausea or vomiting. 10/23/22   Jeanelle Malling, PA  tamsulosin (FLOMAX) 0.4 MG  CAPS capsule Take 0.4 mg by mouth 2 (two) times daily.  12/28/14   [provider]      Allergies    Amoxicillin, Dilaudid [hydromorphone hcl], and Hydromorphone    Review of Systems   Review of Systems  Gastrointestinal:  Positive for abdominal pain.    Physical Exam Updated Vital Signs BP 107/72   Pulse 91   Temp 99.1 F (37.3 C) (Oral)   Resp 18   Ht 5\' 7"  (1.702 m)   Wt 70.8 kg   SpO2 97%   BMI 24.43 kg/m  Physical Exam Vitals and nursing note reviewed.  Constitutional:      General: He is not in acute distress.    Appearance: He is not toxic-appearing.  HENT:     Head: Normocephalic and atraumatic.  Eyes:     General: No scleral icterus.    Conjunctiva/sclera: Conjunctivae normal.  Cardiovascular:     Rate and Rhythm: Normal rate and regular rhythm.     Pulses: Normal pulses.     Heart sounds: Normal heart sounds.  Pulmonary:     Effort: Pulmonary effort is normal. No respiratory distress.     Breath sounds: Normal breath sounds.  Abdominal:     General: Abdomen is flat. Bowel sounds are normal. There is no distension.     Palpations: Abdomen is soft. There is no mass.     Tenderness: There is abdominal tenderness.     Hernia: A hernia is present.     Comments: TTP over epigastric area, left flank  Musculoskeletal:     Right lower leg: No edema.     Left lower leg: No edema.  Skin:    General: Skin is warm and dry.     Findings: No lesion.  Neurological:     General: No focal deficit present.     Mental Status: He is alert and oriented to person, place, and time. Mental status is at baseline.     ED Results / Procedures / Treatments   Labs (all labs ordered are listed, but only abnormal results are displayed) Labs Reviewed  LIPASE, BLOOD - Abnormal; Notable for the following components:      Result Value   Lipase 74 (*)    All other components within normal limits  COMPREHENSIVE METABOLIC PANEL WITH GFR - Abnormal; Notable for the  following components:   Glucose, Bld 169 (*)    AST 13 (*)    All other components within normal limits  URINALYSIS, ROUTINE W REFLEX MICROSCOPIC - Abnormal; Notable for the following components:   Glucose, UA 150 (*)    All other components within normal limits  CBC    EKG None  Radiology CT ABDOMEN PELVIS W CONTRAST Result Date: 06/15/2023 CLINICAL DATA:  Abdominal pain.  History of pancreatitis. EXAM: CT ABDOMEN AND PELVIS  WITH CONTRAST TECHNIQUE: Multidetector CT imaging of the abdomen and pelvis was performed using the standard protocol following bolus administration of intravenous contrast. RADIATION DOSE REDUCTION: This exam was performed according to the departmental dose-optimization program which includes automated exposure control, adjustment of the mA and/or kV according to patient size and/or use of iterative reconstruction technique. CONTRAST:  OMNIPAQUE IOHEXOL 300 MG/ML  SOLN COMPARISON:  CT abdomen pelvis dated 10/23/2022. FINDINGS: Lower chest: Bibasilar subpleural atelectasis/scarring. The visualized lung bases are otherwise clear. No intra-abdominal free air or free fluid. Hepatobiliary: The liver is unremarkable. There is biliary dilatation, post cholecystectomy. Pancreas: Atrophy of the body of the pancreas with dilatation of the main pancreatic duct sequela of chronic pancreatitis. Mild peripancreatic haziness, likely chronic. Correlation with pancreatic enzymes recommended to exclude recurrent pancreatitis. No abscess. Spleen: Normal in size without focal abnormality. Adrenals/Urinary Tract: The adrenal glands are unremarkable. Small nonobstructing bilateral renal calculi measure up to 3 mm in the inferior pole of the left kidney. There is no hydronephrosis on either side. There is symmetric enhancement and excretion of contrast by both kidneys. The visualized ureters and urinary bladder appear unremarkable. Stomach/Bowel: There is sigmoid diverticulosis with muscular  hypertrophy. No active inflammatory changes. There is no bowel obstruction. The appendix is normal. Vascular/Lymphatic: Mild aortoiliac atherosclerotic disease. The IVC is unremarkable. There is high-grade narrowing of the porta splenic confluence due to adhesions. The splenic vein, and main portal vein are patent. No portal venous gas. There is no adenopathy. Upper abdominal collateral vessels noted. Reproductive: The prostate is grossly unremarkable.  No pelvic mass Other: Similar appearance of midline ventral hernias with a 20 fusion of a short segment of small bowel to the right of the midline. No obstruction. Musculoskeletal: Degenerative changes of the spine. No acute osseous pathology. IMPRESSION: 1. Sequela of chronic pancreatitis. Correlation with pancreatic enzymes recommended to exclude acute on chronic pancreatitis. No abscess. 2. Sigmoid diverticulosis. No bowel obstruction. Normal appendix. 3. Small nonobstructing bilateral renal calculi. No hydronephrosis. Electronically Signed   By: Elgie Collard M.D.   On: 06/15/2023 15:50    Procedures Procedures    Medications Ordered in ED Medications - No data to display  ED Course/ Medical Decision Making/ A&P                                 Medical Decision Making Amount and/or Complexity of Data Reviewed Labs: ordered. Radiology: ordered.  Risk Prescription drug management. Decision regarding hospitalization.   Justin [name] 68 y.o. presented today for abd pain. Working DDx includes, but not limited to, gastroenteritis, colitis, SBO, appendicitis, cholecystitis, hepatobiliary pathology, gastritis, PUD, ACS, dissection, pancreatitis, nephrolithiasis, AAA, UTI, pyelonephritis, torsion.   R/o DDx: These are considered less likely than current impression due to history of present illness, physical exam, labs/imaging findings.  Review of prior external notes: 10/23/22 Ed visit   Pmhx: EtOH, pancreatitis, TG, CAD  Unique Tests  and My Interpretation:  CBC without leukocytosis and no anemia.  CMP significant for elevated glucose.  He has no electrolyte abnormality, normal kidney function and normal liver function.  No anion gap.  UA negative for nitrites and leukocytes.  Lipase elevated at 74  Imaging:  CT Abd/Pelvis with contrast: evaluate for structural/surgical etiology of patients' severe abdominal pain.    Problem List / ED Course / Critical interventions / Medication management  Patient reported with 1 week of generalized abdominal pain associated with  nausea and diarrhea.  Patient reports he had a normal colonoscopy approximately 2 years ago.  He is hemodynamically stable and well-appearing.  Denies fevers or chills at home.  Lungs clear to auscultation bilaterally.  He does have reproducible tenderness to left lower quadrant.  He does have ventral hernia easily reducible on does not appear to be painful on exam.  His labs are overall reassuring however does have elevated lipase at 74.  Will give fluids, nausea and pain control and obtain imaging of abdomen pelvis then reassess. I ordered medication including morphine, NS, zofran Reevaluation of the patient after these medicines showed that the patient stayed the same Patients vitals assessed. Upon arrival patient is hemodynamically stable.  I have reviewed the patients home medicines and have made adjustments as needed   Consult: Hospitalist for admission   Plan: Admit for pancreatitis and pain control          Final Clinical Impression(s) / ED Diagnoses Final diagnoses:  Acute on chronic pancreatitis [facility])    Rx / DC Orders ED Discharge Orders     None         Reinaldo Raddle 06/15/23 2350    Bethann Berkshire, MD 06/17/23 1148

## 2023-06-15 NOTE — ED Triage Notes (Signed)
 Pt with abd pain with nausea and diarrhea for a week.  Pt's PCP sent pt here. Pt with hx of pancreatitis

## 2023-06-15 NOTE — Plan of Care (Signed)

## 2023-06-15 NOTE — ED Notes (Signed)
 Pt complains of nausea and pain. Pt states medication did not last long enough. Will notify provider.

## 2023-06-15 NOTE — H&P (Signed)
 History and Physical    Patient: Justin [name] ZOX:[id number] DOB: Jan 06, 1956 DOA: 06/15/2023 DOS: the patient was seen and examined on 06/15/2023 PCP: Justin Specking, MD  Patient coming from: Home  Chief Complaint:  Chief Complaint  Patient presents with   Abdominal Pain   HPI: Justin [name] is a 68 y.o. 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  Patient presents with abdominal pain over the last 7 days.  This has been associated with nausea, vomiting.  His abdominal pain is in the upper abdomen and radiates into his back.  He states this is similar to his previous pancreatitis.  He has been largely intolerant of oral food and liquids.  He came to the hospital for evaluation.  He denies any recent alcohol use.  He states that he does use Creon, but does not know who his GI doctor is.  He states that his wife knows more about that.  Review of Systems: As mentioned in the history of present illness. All other systems reviewed and are negative. Past Medical History:  Diagnosis Date   Alcoholism (HCC)    Asthma    Back pain    Fibromyalgia    GERD (gastroesophageal reflux disease)    High cholesterol    Hypertension    Hypothyroidism    Myocardial infarct [facility])    cardiologist in town   Pancreatitis    with pseudocysts   Pneumonia    Stroke [facility])    right leg weakness   Past Surgical History:  Procedure Laterality Date   BACK SURGERY     BIOPSY  03/22/2020   Procedure: BIOPSY;  Surgeon: Lanelle Bal, DO;  Location: [facility];  Service: Endoscopy;;   CHOLECYSTECTOMY  2009   cystogastrostomy  05/2013   ex laparotomy at [facility]   ESOPHAGOGASTRODUODENOSCOPY (EGD) WITH PROPOFOL N/A 03/22/2020   Procedure: ESOPHAGOGASTRODUODENOSCOPY (EGD) WITH PROPOFOL;  Surgeon: Lanelle Bal, DO;  Location: [facility];  Service: Endoscopy;  Laterality: N/A;   FINGER  SURGERY     I & D EXTREMITY Bilateral 11/10/2017   Procedure: IRRIGATION AND DEBRIDEMENT, BILATERAL UPPER HANDS AND RIGHT FOOT. REVISION AMPUTATION RIGHT RING FINGER.;  Surgeon: Dominica Severin, MD;  Location: [facility] OR;  Service: Orthopedics;  Laterality: Bilateral;   I & D EXTREMITY Bilateral 11/12/2017   Procedure: IRRIGATION AND DEBRIDEMENT RIGHT AND LEFT HANDS;  Surgeon: Dominica Severin, MD;  Location: [facility] OR;  Service: Orthopedics;  Laterality: Bilateral;   INCISION AND DRAINAGE HIP Right 11/12/2017   Procedure: IRRIGATION AND DEBRIDEMENT RIGHT FOOT;  Surgeon: Dominica Severin, MD;  Location: [facility] OR;  Service: Orthopedics;  Laterality: Right;   PANCREATIC PSEUDOCYST DRAINAGE  2014   Social History:  reports that he has quit smoking. His smoking use included cigarettes. He has a 0.6 pack-year smoking history. He has never used smokeless tobacco. He reports current drug use. Drug: Marijuana. He reports that he does not drink alcohol.  Allergies  Allergen Reactions   Amoxicillin Hives and Swelling   Dilaudid [Hydromorphone Hcl] Swelling   Hydromorphone Swelling    Family History  Problem Relation Age of Onset   Diabetes Mother    Heart attack Mother    Diabetes Brother    Heart attack Brother    Brain cancer Brother    Diabetes Brother    Cancer Father        ? stomach   Heart  attack Father    Pancreatitis Neg Hx    Colon cancer Neg Hx    Colon polyps Neg Hx     Prior to Admission medications   Medication Sig Start Date End Date Taking? Authorizing Provider  acetaminophen (TYLENOL) 325 MG tablet Take 2 tablets (650 mg total) by mouth every 6 (six) hours as needed for mild pain or headache (or Fever >/= 101). 03/25/20   Vassie Loll, MD  albuterol (VENTOLIN HFA) 108 (90 Base) MCG/ACT inhaler Inhale 1-2 puffs into the lungs every 6 (six) hours as needed for wheezing or shortness of breath.    [provider]  aspirin 81 MG chewable tablet Chew 81 mg by mouth daily.     [provider]  B Complex Vitamins (VITAMIN B COMPLEX PO) Take by mouth.    [provider]  baclofen (LIORESAL) 10 MG tablet Take 10 mg by mouth 3 (three) times daily as needed for muscle spasms.  11/08/17   [provider]  Cholecalciferol (VITAMIN D3) 1000 units CAPS Take 1,000 Units by mouth.     [provider]  clonazePAM (KLONOPIN) 0.5 MG tablet Take 0.5 mg by mouth at bedtime. *May take one tablet three times daily as needed for anxiety* 12/14/14   [provider]  DULoxetine (CYMBALTA) 60 MG capsule Take 60 mg by mouth every morning.    [provider]  Fenofibric Acid 105 MG TABS Take 105 mg by mouth at bedtime.  12/28/14   [provider]  Flax Oil-Fish Oil-Borage Oil CAPS Take 1 capsule by mouth daily.    [provider]  folic acid (FOLVITE) 1 MG tablet Take 1 mg by mouth 2 (two) times daily. 01/21/20   [provider]  gabapentin (NEURONTIN) 600 MG tablet Take 600 mg by mouth 3 (three) times daily.    [provider]  levothyroxine (SYNTHROID, LEVOTHROID) 112 MCG tablet Take 112 mcg by mouth every morning.     [provider]  methotrexate 2.5 MG tablet Take 15 mg by mouth once a week. 03/20/20   [provider]  Multiple Vitamins-Minerals (MULTIVITAMIN ADULT PO) Take 1 tablet by mouth daily.    [provider]  ondansetron (ZOFRAN ODT) 8 MG disintegrating tablet Take 1 tablet (8 mg total) by mouth every 8 (eight) hours as needed for nausea or vomiting. 03/25/20   Vassie Loll, MD  oxyCODONE-acetaminophen (PERCOCET/ROXICET) 5-325 MG tablet Take 1 tablet by mouth every 6 (six) hours as needed for severe pain. 10/23/22   Jeanelle Malling, PA  Pancrelipase, Lip-Prot-Amyl, 6000-19000 units CPEP Take 2-4 capsules by mouth 5 (five) times daily. Patient takes 4 capsules by mouth three times a day with meals and 2 capsules by mouth twice a day with snacks    [provider]  pantoprazole  (PROTONIX) 40 MG tablet Take 1 tablet (40 mg total) by mouth 2 (two) times daily. 03/25/20   Vassie Loll, MD  polyethylene glycol [facility]lene Hal) packet Take 17 g by mouth daily as needed for mild constipation.  07/19/12   [provider]  promethazine (PHENERGAN) 25 MG tablet Take 1 tablet (25 mg total) by mouth every 6 (six) hours as needed for nausea or vomiting. 10/23/22   Jeanelle Malling, PA  tamsulosin (FLOMAX) 0.4 MG CAPS capsule Take 0.4 mg by mouth 2 (two) times daily.  12/28/14   [provider]    Physical Exam: Vitals:   06/15/23 1154 06/15/23 1245 06/15/23 1300 06/15/23 1645  BP: 107/72 120/81 121/73 116/78  Pulse: 91 79 77 82  Resp: 18   16  Temp: 99.1 F (37.3 C)   98.9 F (37.2 C)  TempSrc: Oral   Oral  SpO2: 97% 94% 94% 95%  Weight:      Height:       General: Older male. Awake and alert and oriented x3. No acute cardiopulmonary distress.  HEENT: Normocephalic atraumatic.  Right and left ears normal in appearance.  Pupils equal, round, reactive to light. Extraocular muscles are intact. Sclerae anicteric and noninjected.  Moist mucosal membranes. No mucosal lesions.  Neck: Neck supple without lymphadenopathy. No carotid bruits. No masses palpated.  Cardiovascular: Regular rate with normal S1-S2 sounds. No murmurs, rubs, gallops auscultated. No JVD.  Respiratory: Good respiratory effort with no wheezes, rales, rhonchi. Lungs clear to auscultation bilaterally.  No accessory muscle use. Abdomen: Soft, tender in the epigastric and left upper quadrant areas, nondistended.  There are scars on his abdomen active bowel sounds. No masses or hepatosplenomegaly  Skin: No rashes, lesions, or ulcerations.  Dry, warm to touch. 2+ dorsalis pedis and radial pulses. Musculoskeletal: No calf or leg pain. All major joints not erythematous nontender.  No upper or lower joint deformation.  Good ROM.  No contractures  Psychiatric: Intact judgment and insight. Pleasant and  cooperative. Neurologic: No focal neurological deficits. Strength is 5/5 and symmetric in upper and lower extremities.  Cranial nerves II through XII are grossly intact.  Data Reviewed: Results for orders placed or performed during the hospital encounter of 06/15/23 (from the past 24 hours)  Lipase, blood     Status: Abnormal   Collection Time: 06/15/23 12:08 PM  Result Value Ref Range   Lipase 74 (H) 11 - 51 U/L  Comprehensive metabolic panel     Status: Abnormal   Collection Time: 06/15/23 12:08 PM  Result Value Ref Range   Sodium 137 135 - 145 mmol/L   Potassium 3.8 3.5 - 5.1 mmol/L   Chloride 106 98 - 111 mmol/L   CO2 22 22 - 32 mmol/L   Glucose, Bld 169 (H) 70 - 99 mg/dL   BUN 23 8 - 23 mg/dL   Creatinine, Ser 2.95 0.61 - 1.24 mg/dL   Calcium 9.3 8.9 - 62.1 mg/dL   Total Protein 7.3 6.5 - 8.1 g/dL   Albumin 3.9 3.5 - 5.0 g/dL   AST 13 (L) 15 - 41 U/L   ALT 16 0 - 44 U/L   Alkaline Phosphatase 86 38 - 126 U/L   Total Bilirubin 0.6 0.0 - 1.2 mg/dL   GFR, Estimated >30 >86 mL/min   Anion gap 9 5 - 15  CBC     Status: None   Collection Time: 06/15/23 12:08 PM  Result Value Ref Range   WBC 6.1 4.0 - 10.5 K/uL   RBC 5.04 4.22 - 5.81 MIL/uL   Hemoglobin 14.4 13.0 - 17.0 g/dL   HCT 57.8 46.9 - 62.9 %   MCV 85.7 80.0 - 100.0 fL   MCH 28.6 26.0 - 34.0 pg   MCHC 33.3 30.0 - 36.0 g/dL   RDW 52.8 41.3 - 24.4 %   Platelets 179 150 - 400 K/uL   nRBC 0.0 0.0 - 0.2 %  Urinalysis, Routine w reflex microscopic -Urine, Clean Catch     Status: Abnormal   Collection Time: 06/15/23 12:35 PM  Result Value Ref Range   Color, Urine YELLOW YELLOW   APPearance CLEAR CLEAR   Specific Gravity,  Urine 1.020 1.005 - 1.030   pH 5.0 5.0 - 8.0   Glucose, UA 150 (A) NEGATIVE mg/dL   Hgb urine dipstick NEGATIVE NEGATIVE   Bilirubin Urine NEGATIVE NEGATIVE   Ketones, ur NEGATIVE NEGATIVE mg/dL   Protein, ur NEGATIVE NEGATIVE mg/dL   Nitrite NEGATIVE NEGATIVE   Leukocytes,Ua NEGATIVE NEGATIVE     CT ABDOMEN PELVIS W CONTRAST Result Date: 06/15/2023 CLINICAL DATA:  Abdominal pain.  History of pancreatitis. EXAM: CT ABDOMEN AND PELVIS WITH CONTRAST TECHNIQUE: Multidetector CT imaging of the abdomen and pelvis was performed using the standard protocol following bolus administration of intravenous contrast. RADIATION DOSE REDUCTION: This exam was performed according to the departmental dose-optimization program which includes automated exposure control, adjustment of the mA and/or kV according to patient size and/or use of iterative reconstruction technique. CONTRAST:  OMNIPAQUE IOHEXOL 300 MG/ML  SOLN COMPARISON:  CT abdomen pelvis dated 10/23/2022. FINDINGS: Lower chest: Bibasilar subpleural atelectasis/scarring. The visualized lung bases are otherwise clear. No intra-abdominal free air or free fluid. Hepatobiliary: The liver is unremarkable. There is biliary dilatation, post cholecystectomy. Pancreas: Atrophy of the body of the pancreas with dilatation of the main pancreatic duct sequela of chronic pancreatitis. Mild peripancreatic haziness, likely chronic. Correlation with pancreatic enzymes recommended to exclude recurrent pancreatitis. No abscess. Spleen: Normal in size without focal abnormality. Adrenals/Urinary Tract: The adrenal glands are unremarkable. Small nonobstructing bilateral renal calculi measure up to 3 mm in the inferior pole of the left kidney. There is no hydronephrosis on either side. There is symmetric enhancement and excretion of contrast by both kidneys. The visualized ureters and urinary bladder appear unremarkable. Stomach/Bowel: There is sigmoid diverticulosis with muscular hypertrophy. No active inflammatory changes. There is no bowel obstruction. The appendix is normal. Vascular/Lymphatic: Mild aortoiliac atherosclerotic disease. The IVC is unremarkable. There is high-grade narrowing of the porta splenic confluence due to adhesions. The splenic vein, and main portal  vein are patent. No portal venous gas. There is no adenopathy. Upper abdominal collateral vessels noted. Reproductive: The prostate is grossly unremarkable.  No pelvic mass Other: Similar appearance of midline ventral hernias with a 20 fusion of a short segment of small bowel to the right of the midline. No obstruction. Musculoskeletal: Degenerative changes of the spine. No acute osseous pathology. IMPRESSION: 1. Sequela of chronic pancreatitis. Correlation with pancreatic enzymes recommended to exclude acute on chronic pancreatitis. No abscess. 2. Sigmoid diverticulosis. No bowel obstruction. Normal appendix. 3. Small nonobstructing bilateral renal calculi. No hydronephrosis. Electronically Signed   By: Elgie Collard M.D.   On: 06/15/2023 15:50     Assessment and Plan: No notes have been filed under this hospital service. Service: Hospitalist  Principal Problem:   Acute on chronic pancreatitis (HCC) Active Problems:   CAD (coronary artery disease) of artery bypass graft   Hypothyroidism   Elevated triglycerides with high cholesterol   Acute on chronic pancreatitis Pain control IV fluids Check creatinine in the morning GI consults Coronary artery disease  Hypothyroidism Continue Synthroid Chronic pain   Advance Care Planning:   Code Status: Full Code   Consults: GI  Family Communication: none  Severity of Illness: The appropriate patient status for this patient is INPATIENT. Inpatient status is judged to be reasonable and necessary in order to provide the required intensity of service to ensure the patient's safety. [name] presenting symptoms, physical exam findings, and initial radiographic and laboratory data in the context of their chronic comorbidities is felt to place them at high risk for  further clinical deterioration. Furthermore, it is not anticipated that the patient will be medically stable for discharge from the hospital within 2 midnights of admission.   * I  certify that at the point of admission it is my clinical judgment that the patient will require inpatient hospital care spanning beyond 2 midnights from the point of admission due to high intensity of service, high risk for further deterioration and high frequency of surveillance required.*  Author: Levie Heritage, DO 06/15/2023 5:54 PM  For on call review [email or web address].

## 2023-06-16 ENCOUNTER — Inpatient Hospital Stay (HOSPITAL_COMMUNITY)

## 2023-06-16 DIAGNOSIS — R109 Unspecified abdominal pain: Secondary | ICD-10-CM

## 2023-06-16 DIAGNOSIS — K861 Other chronic pancreatitis: Secondary | ICD-10-CM | POA: Diagnosis not present

## 2023-06-16 DIAGNOSIS — K859 Acute pancreatitis without necrosis or infection, unspecified: Secondary | ICD-10-CM

## 2023-06-16 LAB — BASIC METABOLIC PANEL WITH GFR
Anion gap: 8 (ref 5–15)
BUN: 13 mg/dL (ref 8–23)
CO2: 24 mmol/L (ref 22–32)
Calcium: 8.5 mg/dL — ABNORMAL LOW (ref 8.9–10.3)
Chloride: 107 mmol/L (ref 98–111)
Creatinine, Ser: 0.65 mg/dL (ref 0.61–1.24)
GFR, Estimated: >60 mL/min (ref >60)
Glucose, Bld: 108 mg/dL — ABNORMAL HIGH (ref 70–99)
Potassium: 3.7 mmol/L (ref 3.5–5.1)
Sodium: 139 mmol/L (ref 135–145)

## 2023-06-16 LAB — CBC
HCT: 41.7 % (ref 39.0–52.0)
Hemoglobin: 13.7 g/dL (ref 13.0–17.0)
MCH: 28.8 pg (ref 26.0–34.0)
MCHC: 32.9 g/dL (ref 30.0–36.0)
MCV: 87.6 fL (ref 80.0–100.0)
Platelets: 158 10*3/uL (ref 150–400)
RBC: 4.76 MIL/uL (ref 4.22–5.81)
RDW: 13.5 % (ref 11.5–15.5)
WBC: 7.4 10*3/uL (ref 4.0–10.5)
nRBC: 0 % (ref 0.0–0.2)

## 2023-06-16 LAB — HIV ANTIBODY (ROUTINE TESTING W REFLEX): HIV Screen 4th Generation wRfx: NONREACTIVE

## 2023-06-16 LAB — LIPASE, BLOOD: Lipase: 36 U/L (ref 11–51)

## 2023-06-16 LAB — ETHANOL: Alcohol, Ethyl (B): <10 mg/dL (ref <10)

## 2023-06-16 MED ORDER — SENNOSIDES-DOCUSATE SODIUM 8.6-50 MG PO TABS
1.0000 | ORAL_TABLET | Freq: Once | ORAL | Status: AC
  Administered 2023-06-16: 1 via ORAL
  Filled 2023-06-16: qty 1

## 2023-06-16 MED ORDER — SUCRALFATE 1 GM/10ML PO SUSP
1.0000 g | Freq: Three times a day (TID) | ORAL | Status: DC
  Administered 2023-06-16 – 2023-06-23 (×28): 1 g via ORAL
  Filled 2023-06-16 (×29): qty 10

## 2023-06-16 MED ORDER — DIPHENHYDRAMINE HCL 25 MG PO CAPS
25.0000 mg | ORAL_CAPSULE | Freq: Four times a day (QID) | ORAL | Status: DC | PRN
  Administered 2023-06-16 – 2023-06-22 (×3): 25 mg via ORAL
  Filled 2023-06-16 (×4): qty 1

## 2023-06-16 MED ORDER — LACTATED RINGERS IV SOLN: INTRAVENOUS | Status: AC

## 2023-06-16 MED ORDER — GADOBUTROL 1 MMOL/ML IV SOLN
7.0000 mL | Freq: Once | INTRAVENOUS | Status: AC | PRN
  Administered 2023-06-16: 7 mL via INTRAVENOUS

## 2023-06-16 NOTE — Progress Notes (Signed)
 PROGRESS NOTE    Justin [name]  WUJ:[id number] DOB: August 17, 1955 DOA: 06/15/2023 PCP: [name], MD   Brief Narrative:   Justin [name] is a 68 y.o. 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  Patient presents with abdominal pain over the last 7 days.  This has been associated with nausea, vomiting.  He has been admitted with acute on chronic pancreatitis and has a history of the same previously and has been seen at [facility] for [facility].  He remains on aggressive IV fluids as well as clear liquid diet and has been started on PPI as well as Carafate per GI.  MRCP ordered and pending.  Assessment & Plan:   Principal Problem:   Acute on chronic pancreatitis (HCC) Active Problems:   CAD (coronary artery disease) of artery bypass graft   Hypothyroidism   Elevated triglycerides with high cholesterol  Assessment and Plan:   Acute on chronic pancreatitis Pain control IV fluids Clear liquid diet GI consult appreciated with MRCP pending Noted to have pruritus with IV pain medications, will order Benadryl Coronary artery disease   Hypothyroidism Continue Synthroid Chronic pain   DVT prophylaxis:Lovenox Code Status: Full Family Communication: Spouse at bedside 4/5 Disposition Plan:  Status is: Inpatient Remains inpatient appropriate because: Need for IV medications and fluids.   Consultants:  GI  Procedures:  None  Antimicrobials:  None   Subjective: Patient seen and evaluated today with stabbing abdominal pain that is ongoing.  He denies any current nausea or vomiting.  He is complaining of some itching related to pain medications.  Objective: Vitals:   06/15/23 1813 06/15/23 2138 06/16/23 0134 06/16/23 0442  BP: (!) 148/76 124/77 (!) 158/97 123/66  Pulse: 60 68 73 70  Resp: 18 20 18    Temp: 97.7 F (36.5 C) 98 F (36.7 C) 98 F (36.7 C) 98.3 F  (36.8 C)  TempSrc: Oral Oral Oral Oral  SpO2: 99% 100% 98% 98%  Weight:      Height:        Intake/Output Summary (Last 24 hours) at 06/16/2023 1207 Last data filed at 06/16/2023 0300 Gross per 24 hour  Intake 870.32 ml  Output --  Net 870.32 ml   Filed Weights   06/15/23 1154  Weight: 70.8 kg    Examination:  General exam: Appears calm and comfortable  Respiratory system: Clear to auscultation. Respiratory effort normal. Cardiovascular system: S1 & S2 heard, RRR.  Gastrointestinal system: Abdomen is soft Central nervous system: Alert and awake Extremities: No edema Skin: No significant lesions noted Psychiatry: Flat affect.    Data Reviewed: I have personally reviewed following labs and imaging studies  CBC: Recent Labs  Lab 06/15/23 1208 06/16/23 0418  WBC 6.1 7.4  HGB 14.4 13.7  HCT 43.2 41.7  MCV 85.7 87.6  PLT 179 158   Basic Metabolic Panel: Recent Labs  Lab 06/15/23 1208 06/16/23 0418  NA 137 139  K 3.8 3.7  CL 106 107  CO2 22 24  GLUCOSE 169* 108*  BUN 23 13  CREATININE 0.73 0.65  CALCIUM 9.3 8.5*   GFR: Estimated Creatinine Clearance: 82.6 mL/min (by C-G formula based on SCr of 0.65 mg/dL). Liver Function Tests: Recent Labs  Lab 06/15/23 1208  AST 13*  ALT 16  ALKPHOS 86  BILITOT 0.6  PROT 7.3  ALBUMIN 3.9   Recent Labs  Lab 06/15/23 1208 06/16/23 0418  LIPASE  74* 36   No results for input(s): "AMMONIA" in the last 168 hours. Coagulation Profile: No results for input(s): "INR", "PROTIME" in the last 168 hours. Cardiac Enzymes: No results for input(s): "CKTOTAL", "CKMB", "CKMBINDEX", "TROPONINI" in the last 168 hours. BNP (last 3 results) No results for input(s): "PROBNP" in the last 8760 hours. HbA1C: No results for input(s): "HGBA1C" in the last 72 hours. CBG: No results for input(s): "GLUCAP" in the last 168 hours. Lipid Profile: No results for input(s): "CHOL", "HDL", "LDLCALC", "TRIG", "CHOLHDL", "LDLDIRECT" in the  last 72 hours. Thyroid Function Tests: No results for input(s): "TSH", "T4TOTAL", "FREET4", "T3FREE", "THYROIDAB" in the last 72 hours. Anemia Panel: No results for input(s): "VITAMINB12", "FOLATE", "FERRITIN", "TIBC", "IRON", "RETICCTPCT" in the last 72 hours. Sepsis Labs: No results for input(s): "PROCALCITON", "LATICACIDVEN" in the last 168 hours.  No results found for this or any previous visit (from the past 240 hours).       Radiology Studies: CT ABDOMEN PELVIS W CONTRAST Result Date: 06/15/2023 CLINICAL DATA:  Abdominal pain.  History of pancreatitis. EXAM: CT ABDOMEN AND PELVIS WITH CONTRAST TECHNIQUE: Multidetector CT imaging of the abdomen and pelvis was performed using the standard protocol following bolus administration of intravenous contrast. RADIATION DOSE REDUCTION: This exam was performed according to the departmental dose-optimization program which includes automated exposure control, adjustment of the mA and/or kV according to patient size and/or use of iterative reconstruction technique. CONTRAST:  OMNIPAQUE IOHEXOL 300 MG/ML  SOLN COMPARISON:  CT abdomen pelvis dated 10/23/2022. FINDINGS: Lower chest: Bibasilar subpleural atelectasis/scarring. The visualized lung bases are otherwise clear. No intra-abdominal free air or free fluid. Hepatobiliary: The liver is unremarkable. There is biliary dilatation, post cholecystectomy. Pancreas: Atrophy of the body of the pancreas with dilatation of the main pancreatic duct sequela of chronic pancreatitis. Mild peripancreatic haziness, likely chronic. Correlation with pancreatic enzymes recommended to exclude recurrent pancreatitis. No abscess. Spleen: Normal in size without focal abnormality. Adrenals/Urinary Tract: The adrenal glands are unremarkable. Small nonobstructing bilateral renal calculi measure up to 3 mm in the inferior pole of the left kidney. There is no hydronephrosis on either side. There is symmetric enhancement and  excretion of contrast by both kidneys. The visualized ureters and urinary bladder appear unremarkable. Stomach/Bowel: There is sigmoid diverticulosis with muscular hypertrophy. No active inflammatory changes. There is no bowel obstruction. The appendix is normal. Vascular/Lymphatic: Mild aortoiliac atherosclerotic disease. The IVC is unremarkable. There is high-grade narrowing of the porta splenic confluence due to adhesions. The splenic vein, and main portal vein are patent. No portal venous gas. There is no adenopathy. Upper abdominal collateral vessels noted. Reproductive: The prostate is grossly unremarkable.  No pelvic mass Other: Similar appearance of midline ventral hernias with a 20 fusion of a short segment of small bowel to the right of the midline. No obstruction. Musculoskeletal: Degenerative changes of the spine. No acute osseous pathology. IMPRESSION: 1. Sequela of chronic pancreatitis. Correlation with pancreatic enzymes recommended to exclude acute on chronic pancreatitis. No abscess. 2. Sigmoid diverticulosis. No bowel obstruction. Normal appendix. 3. Small nonobstructing bilateral renal calculi. No hydronephrosis. Electronically Signed   By: Elgie Collard M.D.   On: 06/15/2023 15:50        Scheduled Meds:  aspirin  81 mg Oral Daily   DULoxetine  60 mg Oral q morning   enoxaparin (LOVENOX) injection  40 mg Subcutaneous Q24H   fenofibrate  160 mg Oral QHS   levothyroxine  112 mcg Oral Q0600   pantoprazole  40 mg Oral BID   sucralfate  1 g Oral TID WC & HS   tamsulosin  0.4 mg Oral BID   Continuous Infusions:  lactated ringers 150 mL/hr at 06/16/23 0129     LOS: 1 day    Time spent: 55 minutes    Vincen Bejar Hoover Brunette, DO [facility]  If 7PM-7AM, please contact night-coverage [email or web address] 06/16/2023, 12:07 PM

## 2023-06-16 NOTE — Plan of Care (Signed)

## 2023-06-16 NOTE — Progress Notes (Signed)
   06/16/23 1627  TOC Brief Assessment  Insurance and Status Reviewed  Patient has primary care physician Yes  Home environment has been reviewed Single family home  Prior level of function: Independent  Prior/Current Home Services No current home services  Readmission risk has been reviewed Yes   Transition of Care Department (TOC) has reviewed patient and no TOC needs have been identified at this time. We will continue to monitor patient advancement through interdisciplinary progression rounds. If new patient transition needs arise, please place a TOC consult.

## 2023-06-16 NOTE — Consult Note (Addendum)
 Katrinka Blazing, M.D. Gastroenterology & Hepatology                                           Patient Name: Justin [name] Account #: @[email or web address] @   MRN: [id number] Admission Date: 06/15/2023 Date of Evaluation:  06/16/2023 Time of Evaluation: 10:55 AM   Referring Physician: Maurilio Lovely, DO  Chief Complaint: Abdominal pain  HPI:  This is a 68 y.o. 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  Patient has presented exacerbation of his abdominal pain for last 2 weeks and a half.  Reports that he has presented diffuse pain in his abdomen, mostly in the periumbilical area.  He reports that his pain can get up to a 10 out of 10, which he describes as a stabbing pain, although sometimes it is a 7 out of 10 in severity..  He has presented some recurrent episodes of nausea.  He decided to hold off on further evaluation during the last few weeks as his stepson had an accident and he had to take care of him.  Reported he had 1 episode of vomiting, no fever, chills, melena abdominal distention, hematochezia.  He has not had regular bowel movements, usually 1-2 bowel moods per day.  Does not take opiates on a regular basis although he has some prescriptions for oxycodone at home.  Patient denies drinking any more alcohol since 2009.  He currently follows with [facility].  Last visit with them was on 02/01/2019 for.  Disease has been managed with Creon, baclofen and low-fat diet.  Underwent MRCP on 12/14/2018 for which showed pancreatic ductal dilation up to 7 mm which was slightly increased from prior.  Based on last discussion, he was advised to possibly undergo endoscopic retrograde cholangiopancreatography but the patient has not decided about this in the  past.  Last EGD performed 03/22/2020 by Dr. Marletta Lor, which showed presence of erosions and erythema in the stomach (see Cilest BX showing minimal superficial without any acute evaluation involvement), normal duodenum and esophagus.  Labs were remarkable for lipase of 74, normal liver function test, normal electrolytes, CBC.  A CT of the abdomen and pelvis with IV contrast was performed which showed changes of chronic pancreatitis with some mild peripancreatic haziness.  No other acute abnormalities.  Upon my review, there is presence of pancreatic ductal dilation, questionable stone versus pancreatic calcifications.  Past Medical History: SEE CHRONIC ISSSUES: Past Medical History:  Diagnosis Date   Alcoholism (HCC)    Asthma    Back pain    Fibromyalgia    GERD (gastroesophageal reflux disease)    High cholesterol    Hypertension    Hypothyroidism    Myocardial infarct [facility])    cardiologist in town   Pancreatitis    with pseudocysts   Pneumonia    Stroke [facility])    right leg weakness   Past Surgical History:  Past Surgical History:  Procedure Laterality Date   BACK SURGERY     BIOPSY  03/22/2020   Procedure: BIOPSY;  Surgeon: Lanelle Bal, DO;  Location: [facility];  Service: Endoscopy;;   CHOLECYSTECTOMY  2009   cystogastrostomy  05/2013   ex laparotomy at [facility]   ESOPHAGOGASTRODUODENOSCOPY (EGD) WITH PROPOFOL  N/A 03/22/2020   Procedure: ESOPHAGOGASTRODUODENOSCOPY (EGD) WITH PROPOFOL;  Surgeon: Lanelle Bal, DO;  Location: [facility];  Service: Endoscopy;  Laterality: N/A;   FINGER SURGERY     I & D EXTREMITY Bilateral 11/10/2017   Procedure: IRRIGATION AND DEBRIDEMENT, BILATERAL UPPER HANDS AND RIGHT FOOT. REVISION AMPUTATION RIGHT RING FINGER.;  Surgeon: Dominica Severin, MD;  Location: [facility] OR;  Service: Orthopedics;  Laterality: Bilateral;   I & D EXTREMITY Bilateral 11/12/2017   Procedure: IRRIGATION AND DEBRIDEMENT RIGHT AND LEFT HANDS;  Surgeon: Dominica Severin,  MD;  Location: [facility] OR;  Service: Orthopedics;  Laterality: Bilateral;   INCISION AND DRAINAGE HIP Right 11/12/2017   Procedure: IRRIGATION AND DEBRIDEMENT RIGHT FOOT;  Surgeon: Dominica Severin, MD;  Location: [facility] OR;  Service: Orthopedics;  Laterality: Right;   PANCREATIC PSEUDOCYST DRAINAGE  2014   Family History:  Family History  Problem Relation Age of Onset   Diabetes Mother    Heart attack Mother    Diabetes Brother    Heart attack Brother    Brain cancer Brother    Diabetes Brother    Cancer Father        ? stomach   Heart attack Father    Pancreatitis Neg Hx    Colon cancer Neg Hx    Colon polyps Neg Hx    Social History:  Social History   Tobacco Use   Smoking status: Former    Current packs/day: 0.12    Average packs/day: 0.1 packs/day for 5.0 years (0.6 ttl pk-yrs)    Types: Cigarettes   Smokeless tobacco: Never   Tobacco comments:    11/12/2017 "quit smoking in the 1980s/1990s"  Vaping Use   Vaping status: Never Used  Substance Use Topics   Alcohol use: No    Comment:  previously used alcohol and quit  ~ 2008   Drug use: Yes    Types: Marijuana    Comment: daily    Home Medications:  Prior to Admission medications   Medication Sig Start Date End Date Taking? Authorizing Provider  acetaminophen (TYLENOL) 325 MG tablet Take 2 tablets (650 mg total) by mouth every 6 (six) hours as needed for mild pain or headache (or Fever >/= 101). 03/25/20   Vassie Loll, MD  albuterol (VENTOLIN HFA) 108 (90 Base) MCG/ACT inhaler Inhale 1-2 puffs into the lungs every 6 (six) hours as needed for wheezing or shortness of breath.    [provider]  aspirin 81 MG chewable tablet Chew 81 mg by mouth daily.     [provider]  B Complex Vitamins (VITAMIN B COMPLEX PO) Take by mouth.    [provider]  baclofen (LIORESAL) 10 MG tablet Take 10 mg by mouth 3 (three) times daily as needed for muscle spasms.  11/08/17   [provider]  Cholecalciferol  (VITAMIN D3) 1000 units CAPS Take 1,000 Units by mouth.     [provider]  clonazePAM (KLONOPIN) 0.5 MG tablet Take 0.5 mg by mouth at bedtime. *May take one tablet three times daily as needed for anxiety* 12/14/14   [provider]  DULoxetine (CYMBALTA) 60 MG capsule Take 60 mg by mouth every morning.    [provider]  Fenofibric Acid 105 MG TABS Take 105 mg by mouth at bedtime.  12/28/14   [provider]  Flax Oil-Fish Oil-Borage Oil CAPS Take 1 capsule by mouth daily.    [provider]  folic acid (FOLVITE) 1 MG tablet Take  1 mg by mouth 2 (two) times daily. 01/21/20   [provider]  gabapentin (NEURONTIN) 600 MG tablet Take 600 mg by mouth 3 (three) times daily.    [provider]  levothyroxine (SYNTHROID, LEVOTHROID) 112 MCG tablet Take 112 mcg by mouth every morning.     [provider]  methotrexate 2.5 MG tablet Take 15 mg by mouth once a week. 03/20/20   [provider]  Multiple Vitamins-Minerals (MULTIVITAMIN ADULT PO) Take 1 tablet by mouth daily.    [provider]  ondansetron (ZOFRAN ODT) 8 MG disintegrating tablet Take 1 tablet (8 mg total) by mouth every 8 (eight) hours as needed for nausea or vomiting. 03/25/20   Vassie Loll, MD  oxyCODONE-acetaminophen (PERCOCET/ROXICET) 5-325 MG tablet Take 1 tablet by mouth every 6 (six) hours as needed for severe pain. 10/23/22   Jeanelle Malling, PA  Pancrelipase, Lip-Prot-Amyl, 6000-19000 units CPEP Take 2-4 capsules by mouth 5 (five) times daily. Patient takes 4 capsules by mouth three times a day with meals and 2 capsules by mouth twice a day with snacks    [provider]  pantoprazole (PROTONIX) 40 MG tablet Take 1 tablet (40 mg total) by mouth 2 (two) times daily. 03/25/20   Vassie Loll, MD  polyethylene glycol [facility] Ethelene Hal) packet Take 17 g by mouth daily as needed for mild constipation.  07/19/12   [provider]   promethazine (PHENERGAN) 25 MG tablet Take 1 tablet (25 mg total) by mouth every 6 (six) hours as needed for nausea or vomiting. 10/23/22   Jeanelle Malling, PA  tamsulosin (FLOMAX) 0.4 MG CAPS capsule Take 0.4 mg by mouth 2 (two) times daily.  12/28/14   [provider]    Inpatient Medications:  Current Facility-Administered Medications:    aspirin chewable tablet 81 mg, 81 mg, Oral, Daily, Levie Heritage, DO, 81 mg at 06/16/23 1308   diphenhydrAMINE (BENADRYL) capsule 25 mg, 25 mg, Oral, Q6H PRN, Sherryll Burger, Pratik D, DO, 25 mg at 06/16/23 1008   DULoxetine (CYMBALTA) DR capsule 60 mg, 60 mg, Oral, q morning, Levie Heritage, DO, 60 mg at 06/16/23 0959   enoxaparin (LOVENOX) injection 40 mg, 40 mg, Subcutaneous, Q24H, Stinson, Jacob J, DO, 40 mg at 06/15/23 2136   fenofibrate tablet 160 mg, 160 mg, Oral, QHS, Stinson, Jacob J, DO   lactated ringers infusion, , Intravenous, Continuous, Levie Heritage, DO, Last Rate: 150 mL/hr at 06/16/23 0129, New Bag at 06/16/23 0129   levothyroxine (SYNTHROID) tablet 112 mcg, 112 mcg, Oral, Q0600, Levie Heritage, DO, 112 mcg at 06/16/23 0440   morphine (PF) 4 MG/ML injection 4 mg, 4 mg, Intravenous, Q3H PRN, Levie Heritage, DO, 4 mg at 06/16/23 0736   ondansetron (ZOFRAN) tablet 4 mg, 4 mg, Oral, Q6H PRN **OR** ondansetron (ZOFRAN) injection 4 mg, 4 mg, Intravenous, Q6H PRN, Levie Heritage, DO, 4 mg at 06/16/23 0736   pantoprazole (PROTONIX) EC tablet 40 mg, 40 mg, Oral, BID, Stinson, Jacob J, DO, 40 mg at 06/16/23 0959   sucralfate (CARAFATE) 1 GM/10ML suspension 1 g, 1 g, Oral, TID WC & HS, Dolores Frame, MD   tamsulosin [facility]) capsule 0.4 mg, 0.4 mg, Oral, BID, Stinson, Jacob J, DO, 0.4 mg at 06/16/23 6578 Allergies: Amoxicillin, Dilaudid [hydromorphone hcl], and Hydromorphone  Complete Review of Systems: GENERAL: negative for malaise, night sweats HEENT: No changes in hearing or vision, no nose bleeds or other nasal problems. NECK:  Negative for  lumps, goiter, pain and significant neck swelling RESPIRATORY: Negative for cough, wheezing CARDIOVASCULAR: Negative for chest pain, leg swelling, palpitations, orthopnea GI: SEE HPI MUSCULOSKELETAL: Negative for joint pain or swelling, back pain, and muscle pain. SKIN: Negative for lesions, rash PSYCH: Negative for sleep disturbance, mood disorder and recent psychosocial stressors. HEMATOLOGY Negative for prolonged bleeding, bruising easily, and swollen nodes. ENDOCRINE: Negative for cold or heat intolerance, polyuria, polydipsia and goiter. NEURO: negative for tremor, gait imbalance, syncope and seizures. The remainder of the review of systems is noncontributory.  Physical Exam: BP 123/66   Pulse 70   Temp 98.3 F (36.8 C) (Oral)   Resp 18   Ht 5\' 7"  (1.702 m)   Wt 70.8 kg   SpO2 98%   BMI 24.43 kg/m  GENERAL: The patient is AO x3, in no acute distress. HEENT: Head is normocephalic and atraumatic. EOMI are intact. Mouth is well hydrated and without lesions. NECK: Supple. No masses LUNGS: Clear to auscultation. No presence of rhonchi/wheezing/rales. Adequate chest expansion HEART: RRR, normal s1 and s2. ABDOMEN: mildly tender upon palpation of the mid abdomen, no guarding, no peritoneal signs, and nondistended. BS +. No masses. EXTREMITIES: Without any cyanosis, clubbing, rash, lesions or edema. NEUROLOGIC: AOx3, no focal motor deficit. SKIN: no jaundice, no rashes  Laboratory Data CBC:     Component Value Date/Time   WBC 7.4 06/16/2023 0418   RBC 4.76 06/16/2023 0418   HGB 13.7 06/16/2023 0418   HCT 41.7 06/16/2023 0418   PLT 158 06/16/2023 0418   MCV 87.6 06/16/2023 0418   MCH 28.8 06/16/2023 0418   MCHC 32.9 06/16/2023 0418   RDW 13.5 06/16/2023 0418   LYMPHSABS 0.7 03/21/2020 0745   MONOABS 0.6 03/21/2020 0745   EOSABS 0.0 03/21/2020 0745   BASOSABS 0.0 03/21/2020 0745   COAG:  Lab Results  Component Value Date   INR 1.01 11/15/2017   INR 1.18  06/25/2012   INR 0.94 05/30/2012    BMP:     Latest Ref Rng & Units 06/16/2023    4:18 AM 06/15/2023   12:08 PM 10/23/2022    4:50 PM  BMP  Glucose 70 - 99 mg/dL 161  096  045   BUN 8 - 23 mg/dL 13  23  14    Creatinine 0.61 - 1.24 mg/dL 4.09  8.11  9.14   Sodium 135 - 145 mmol/L 139  137  137   Potassium 3.5 - 5.1 mmol/L 3.7  3.8  3.4   Chloride 98 - 111 mmol/L 107  106  101   CO2 22 - 32 mmol/L 24  22  26    Calcium 8.9 - 10.3 mg/dL 8.5  9.3  9.3     HEPATIC:     Latest Ref Rng & Units 06/15/2023   12:08 PM 10/23/2022    4:50 PM 03/25/2020    4:31 AM  Hepatic Function  Total Protein 6.5 - 8.1 g/dL 7.3  8.1  5.9   Albumin 3.5 - 5.0 g/dL 3.9  4.5  3.2   AST 15 - 41 U/L 13  14  16    ALT 0 - 44 U/L 16  26  32   Alk Phosphatase 38 - 126 U/L 86  83  79   Total Bilirubin 0.0 - 1.2 mg/dL 0.6  0.7  0.7   Bilirubin, Direct 0.0 - 0.2 mg/dL   0.1     CARDIAC:  Lab Results  Component Value Date   TROPONINI <0.30 06/28/2012  Imaging: I personally reviewed and interpreted the available imaging.  Assessment & Plan: KENDREW PACI is a 68 y.o. 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  The patient has presented exacerbation of his chronic abdominal pain.  Has not been drinking alcohol recently and denies any recent triggers for his symptoms of abdominal pain.  Episodes of nausea, he has not presented any other associated symptoms.  Cross-sectional abdominal imaging showed some mild peripancreatic haziness which could be related to exacerbation of his chronic pancreatitis.  At this point, we will recommend conservative management, which includes slowly advancing his diet and pain control, can try dicyclomine as needed to decrease his abdominal pain in  conjunction with opiate use.  Can also give Zofran as needed to decrease his nausea.  Once he is able to advance his diet, he will need to restart his Creon.  As the patient has presented some increased ductal dilation on previous imaging studies performed at [facility] and he had exacerbation of symptoms, we will need to evaluate for his symptoms with an MRCP.  Reassuringly, his LFTs have been normal.  Ultimately, if his abdominal pain does not improve, we may consider proceeding with an EGD, although I consider this would be of low yield at this point.  Can also start a trial of Carafate in conjunction with PPI twice daily.  -Proceed with MRCP -Pantoprazole 40 mg twice daily - Carafate 1 g every 6 hours - Dicyclomine as needed for abdominal pain - May use rescue doses of opiates for pain control - Zofran as needed for nausea - Clear liquid diet today, may advance if tolerated - Restart Creon when diet is advanced to low-fat soft consistency  Katrinka Blazing, MD Gastroenterology and Hepatology [facility]

## 2023-06-17 DIAGNOSIS — K861 Other chronic pancreatitis: Secondary | ICD-10-CM | POA: Diagnosis not present

## 2023-06-17 DIAGNOSIS — K859 Acute pancreatitis without necrosis or infection, unspecified: Secondary | ICD-10-CM | POA: Diagnosis not present

## 2023-06-17 LAB — COMPREHENSIVE METABOLIC PANEL WITH GFR
ALT: 114 U/L — ABNORMAL HIGH (ref 0–44)
AST: 50 U/L — ABNORMAL HIGH (ref 15–41)
Albumin: 3.3 g/dL — ABNORMAL LOW (ref 3.5–5.0)
Alkaline Phosphatase: 138 U/L — ABNORMAL HIGH (ref 38–126)
Anion gap: 7 (ref 5–15)
BUN: 7 mg/dL — ABNORMAL LOW (ref 8–23)
CO2: 28 mmol/L (ref 22–32)
Calcium: 8.6 mg/dL — ABNORMAL LOW (ref 8.9–10.3)
Chloride: 105 mmol/L (ref 98–111)
Creatinine, Ser: 0.72 mg/dL (ref 0.61–1.24)
GFR, Estimated: >60 mL/min (ref >60)
Glucose, Bld: 91 mg/dL (ref 70–99)
Potassium: 3.5 mmol/L (ref 3.5–5.1)
Sodium: 140 mmol/L (ref 135–145)
Total Bilirubin: 0.7 mg/dL (ref 0.0–1.2)
Total Protein: 6 g/dL — ABNORMAL LOW (ref 6.5–8.1)

## 2023-06-17 LAB — MAGNESIUM: Magnesium: 1.9 mg/dL (ref 1.7–2.4)

## 2023-06-17 MED ORDER — MELATONIN 3 MG PO TABS
6.0000 mg | ORAL_TABLET | Freq: Once | ORAL | Status: AC
  Administered 2023-06-17: 6 mg via ORAL
  Filled 2023-06-17: qty 2

## 2023-06-17 MED ORDER — FENTANYL CITRATE PF 50 MCG/ML IJ SOSY
25.0000 ug | PREFILLED_SYRINGE | INTRAMUSCULAR | Status: DC | PRN
  Administered 2023-06-17 – 2023-06-22 (×33): 25 ug via INTRAVENOUS
  Filled 2023-06-17 (×35): qty 1

## 2023-06-17 MED ORDER — PANCRELIPASE (LIP-PROT-AMYL) 12000-38000 UNITS PO CPEP
12000.0000 [IU] | ORAL_CAPSULE | ORAL | Status: DC
  Administered 2023-06-17 – 2023-06-19 (×6): 12000 [IU] via ORAL
  Filled 2023-06-17 (×6): qty 1

## 2023-06-17 MED ORDER — CLONAZEPAM 0.5 MG PO TABS
0.5000 mg | ORAL_TABLET | Freq: Every day | ORAL | Status: DC
  Administered 2023-06-17 – 2023-06-22 (×6): 0.5 mg via ORAL
  Filled 2023-06-17 (×6): qty 1

## 2023-06-17 MED ORDER — PANCRELIPASE (LIP-PROT-AMYL) 12000-38000 UNITS PO CPEP
24000.0000 [IU] | ORAL_CAPSULE | Freq: Three times a day (TID) | ORAL | Status: DC
  Administered 2023-06-17 – 2023-06-19 (×6): 24000 [IU] via ORAL
  Filled 2023-06-17 (×6): qty 2

## 2023-06-17 MED ORDER — DIPHENHYDRAMINE HCL 25 MG PO CAPS: 50.0000 mg | ORAL_CAPSULE | Freq: Every evening | ORAL | Status: DC | PRN

## 2023-06-17 MED ORDER — DIPHENHYDRAMINE-ZINC ACETATE 2-0.1 % EX CREA
TOPICAL_CREAM | Freq: Three times a day (TID) | CUTANEOUS | Status: DC | PRN
Start: 2023-06-17 — End: 2023-06-23

## 2023-06-17 MED ORDER — OXYBUTYNIN CHLORIDE ER 5 MG PO TB24
10.0000 mg | ORAL_TABLET | Freq: Every day | ORAL | Status: DC
  Administered 2023-06-17: 10 mg via ORAL
  Filled 2023-06-17: qty 2

## 2023-06-17 MED ORDER — GABAPENTIN 300 MG PO CAPS
600.0000 mg | ORAL_CAPSULE | Freq: Three times a day (TID) | ORAL | Status: DC
  Administered 2023-06-17: 600 mg via ORAL
  Filled 2023-06-17: qty 2

## 2023-06-17 NOTE — Plan of Care (Signed)

## 2023-06-17 NOTE — Plan of Care (Signed)

## 2023-06-17 NOTE — Plan of Care (Signed)
   Problem: Education: Goal: Knowledge of General Education information will improve Description: Including pain rating scale, medication(s)/side effects and non-pharmacologic comfort measures Outcome: Progressing   Problem: Nutrition: Goal: Adequate nutrition will be maintained Outcome: Progressing   Problem: Coping: Goal: Level of anxiety will decrease Outcome: Progressing

## 2023-06-17 NOTE — Progress Notes (Signed)
 PROGRESS NOTE    Justin [name]  WUJ:[id number] DOB: Sep 30, 1955 DOA: 06/15/2023 PCP: [name], MD   Brief Narrative:   Justin [name] is a 68 y.o. 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  Patient presents with abdominal pain over the last 7 days.  This has been associated with nausea, vomiting.  He has been admitted with acute on chronic pancreatitis and has a history of the same previously and has been seen at [facility] for this.  He remains on aggressive IV fluids as well as clear liquid diet and has been started on PPI as well as Carafate per GI.  MRCP with no significant findings noted.  Assessment & Plan:   Principal Problem:   Acute on chronic pancreatitis (HCC) Active Problems:   CAD (coronary artery disease) of artery bypass graft   Hypothyroidism   Elevated triglycerides with high cholesterol  Assessment and Plan:   Acute on chronic pancreatitis Pain control IV fluids Clear liquid diet GI consult appreciated with MRCP demonstrating no acute findings Noted to have pruritus with IV pain medications, will order Benadryl Coronary artery disease   Hypothyroidism Continue Synthroid Chronic pain   DVT prophylaxis:Lovenox Code Status: Full Family Communication: Spouse at bedside 4/5 Disposition Plan:  Status is: Inpatient Remains inpatient appropriate because: Need for IV medications and fluids.   Consultants:  GI  Procedures:  None  Antimicrobials:  None   Subjective: Patient seen and evaluated today with improvement in abdominal pain noted today.  Denies any significant nausea or vomiting.  Having some trouble with sleep overnight.  Objective: Vitals:   06/16/23 0442 06/16/23 1336 06/16/23 2132 06/17/23 0447  BP: 123/66 (!) 142/86 (!) 143/80 135/70  Pulse: 70 74 70 62  Resp:  18 18 18   Temp: 98.3 F (36.8 C) 98.1 F (36.7 C) 98.3  F (36.8 C) 98.1 F (36.7 C)  TempSrc: Oral Oral Oral Oral  SpO2: 98% 98% 96% 99%  Weight:      Height:        Intake/Output Summary (Last 24 hours) at 06/17/2023 1054 Last data filed at 06/17/2023 0426 Gross per 24 hour  Intake 2175 ml  Output --  Net 2175 ml   Filed Weights   06/15/23 1154  Weight: 70.8 kg    Examination:  General exam: Appears calm and comfortable  Respiratory system: Clear to auscultation. Respiratory effort normal. Cardiovascular system: S1 & S2 heard, RRR.  Gastrointestinal system: Abdomen is soft Central nervous system: Alert and awake Extremities: No edema Skin: No significant lesions noted Psychiatry: Flat affect.    Data Reviewed: I have personally reviewed following labs and imaging studies  CBC: Recent Labs  Lab 06/15/23 1208 06/16/23 0418  WBC 6.1 7.4  HGB 14.4 13.7  HCT 43.2 41.7  MCV 85.7 87.6  PLT 179 158   Basic Metabolic Panel: Recent Labs  Lab 06/15/23 1208 06/16/23 0418 06/17/23 0405  NA 137 139 140  K 3.8 3.7 3.5  CL 106 107 105  CO2 22 24 28   GLUCOSE 169* 108* 91  BUN 23 13 7*  CREATININE 0.73 0.65 0.72  CALCIUM 9.3 8.5* 8.6*  MG  --   --  1.9   GFR: Estimated Creatinine Clearance: 82.6 mL/min (by C-G formula based on SCr of 0.72 mg/dL). Liver Function Tests: Recent Labs  Lab 06/15/23 1208 06/17/23 0405  AST 13* 50*  ALT 16 114*  ALKPHOS 86 138*  BILITOT 0.6 0.7  PROT 7.3 6.0*  ALBUMIN 3.9 3.3*   Recent Labs  Lab 06/15/23 1208 06/16/23 0418  LIPASE 74* 36   No results for input(s): "AMMONIA" in the last 168 hours. Coagulation Profile: No results for input(s): "INR", "PROTIME" in the last 168 hours. Cardiac Enzymes: No results for input(s): "CKTOTAL", "CKMB", "CKMBINDEX", "TROPONINI" in the last 168 hours. BNP (last 3 results) No results for input(s): "PROBNP" in the last 8760 hours. HbA1C: No results for input(s): "HGBA1C" in the last 72 hours. CBG: No results for input(s): "GLUCAP" in the  last 168 hours. Lipid Profile: No results for input(s): "CHOL", "HDL", "LDLCALC", "TRIG", "CHOLHDL", "LDLDIRECT" in the last 72 hours. Thyroid Function Tests: No results for input(s): "TSH", "T4TOTAL", "FREET4", "T3FREE", "THYROIDAB" in the last 72 hours. Anemia Panel: No results for input(s): "VITAMINB12", "FOLATE", "FERRITIN", "TIBC", "IRON", "RETICCTPCT" in the last 72 hours. Sepsis Labs: No results for input(s): "PROCALCITON", "LATICACIDVEN" in the last 168 hours.  No results found for this or any previous visit (from the past 240 hours).       Radiology Studies: MR ABDOMEN MRCP W WO CONTAST Result Date: 06/16/2023 CLINICAL DATA:  Acute on chronic pancreatitis. EXAM: MRI ABDOMEN WITHOUT AND WITH CONTRAST (INCLUDING MRCP) TECHNIQUE: Multiplanar multisequence MR imaging of the abdomen was performed both before and after the administration of intravenous contrast. Heavily T2-weighted images of the biliary and pancreatic ducts were obtained, and three-dimensional MRCP images were rendered by post processing. CONTRAST:  7mL GADAVIST GADOBUTROL 1 MMOL/ML IV SOLN COMPARISON:  CT on 06/15/2023 FINDINGS: Lower chest: No acute findings. Hepatobiliary: No hepatic masses identified. Prior cholecystectomy noted. Mild dilatation of intrahepatic bile ducts is seen, while the common duct measures 7 mm in diameter which is within normal limits. No evidence of choledocholithiasis or biliary stricture. Pancreas: Atrophy and ductal dilatation is seen involving the pancreatic body and tail. Dilated pancreatic duct terminates in the pancreatic neck, where there is evidence fibrosis and tethering of the posterior wall of the distal stomach. No masses seen at this site. No evidence of pancreatic edema or acute peripancreatic inflammatory changes. No abnormal fluid collections are identified. Spleen:  Within normal limits in size and appearance. Adrenals/Urinary Tract: No suspicious masses identified. No evidence of  hydronephrosis. Stomach/Bowel: Diverticulosis involving the visualized portion of the descending colon, without signs of diverticulitis in this region. Vascular/Lymphatic: No pathologically enlarged lymph nodes identified. No acute vascular findings. Other: 2 small left paraumbilical ventral hernias are seen which contain only fat. A small right paraumbilical ventral hernia is seen which contains a small bowel loop. No evidence of bowel obstruction. Musculoskeletal:  No suspicious bone lesions identified. IMPRESSION: Atrophy and pancreatic ductal dilatation involving the body and tail, with fibrosis and tethering of pancreatic neck to the adjacent distal stomach. This may be due to sequelae of chronic pancreatitis or prior traumatic injury. No radiographic evidence of acute pancreatitis or mass. Prior cholecystectomy. Mild intrahepatic biliary ductal dilatation, without evidence of choledocholithiasis or biliary stricture. Small right paraumbilical ventral hernia which contains a small bowel loop. No evidence of bowel obstruction. Two small left paraumbilical ventral hernias which contain only fat. Colonic diverticulosis, without radiographic evidence of diverticulitis. Electronically Signed   By: Danae Orleans M.D.   On: 06/16/2023 16:30   MR 3D Recon At Scanner Result Date: 06/16/2023 CLINICAL DATA:  Acute on chronic pancreatitis. EXAM: MRI ABDOMEN WITHOUT AND WITH CONTRAST (INCLUDING MRCP) TECHNIQUE: Multiplanar multisequence MR imaging of the abdomen was performed  both before and after the administration of intravenous contrast. Heavily T2-weighted images of the biliary and pancreatic ducts were obtained, and three-dimensional MRCP images were rendered by post processing. CONTRAST:  7mL GADAVIST GADOBUTROL 1 MMOL/ML IV SOLN COMPARISON:  CT on 06/15/2023 FINDINGS: Lower chest: No acute findings. Hepatobiliary: No hepatic masses identified. Prior cholecystectomy noted. Mild dilatation of intrahepatic bile ducts  is seen, while the common duct measures 7 mm in diameter which is within normal limits. No evidence of choledocholithiasis or biliary stricture. Pancreas: Atrophy and ductal dilatation is seen involving the pancreatic body and tail. Dilated pancreatic duct terminates in the pancreatic neck, where there is evidence fibrosis and tethering of the posterior wall of the distal stomach. No masses seen at this site. No evidence of pancreatic edema or acute peripancreatic inflammatory changes. No abnormal fluid collections are identified. Spleen:  Within normal limits in size and appearance. Adrenals/Urinary Tract: No suspicious masses identified. No evidence of hydronephrosis. Stomach/Bowel: Diverticulosis involving the visualized portion of the descending colon, without signs of diverticulitis in this region. Vascular/Lymphatic: No pathologically enlarged lymph nodes identified. No acute vascular findings. Other: 2 small left paraumbilical ventral hernias are seen which contain only fat. A small right paraumbilical ventral hernia is seen which contains a small bowel loop. No evidence of bowel obstruction. Musculoskeletal:  No suspicious bone lesions identified. IMPRESSION: Atrophy and pancreatic ductal dilatation involving the body and tail, with fibrosis and tethering of pancreatic neck to the adjacent distal stomach. This may be due to sequelae of chronic pancreatitis or prior traumatic injury. No radiographic evidence of acute pancreatitis or mass. Prior cholecystectomy. Mild intrahepatic biliary ductal dilatation, without evidence of choledocholithiasis or biliary stricture. Small right paraumbilical ventral hernia which contains a small bowel loop. No evidence of bowel obstruction. Two small left paraumbilical ventral hernias which contain only fat. Colonic diverticulosis, without radiographic evidence of diverticulitis. Electronically Signed   By: Danae Orleans M.D.   On: 06/16/2023 16:30   CT ABDOMEN PELVIS W  CONTRAST Result Date: 06/15/2023 CLINICAL DATA:  Abdominal pain.  History of pancreatitis. EXAM: CT ABDOMEN AND PELVIS WITH CONTRAST TECHNIQUE: Multidetector CT imaging of the abdomen and pelvis was performed using the standard protocol following bolus administration of intravenous contrast. RADIATION DOSE REDUCTION: This exam was performed according to the departmental dose-optimization program which includes automated exposure control, adjustment of the mA and/or kV according to patient size and/or use of iterative reconstruction technique. CONTRAST:  OMNIPAQUE IOHEXOL 300 MG/ML  SOLN COMPARISON:  CT abdomen pelvis dated 10/23/2022. FINDINGS: Lower chest: Bibasilar subpleural atelectasis/scarring. The visualized lung bases are otherwise clear. No intra-abdominal free air or free fluid. Hepatobiliary: The liver is unremarkable. There is biliary dilatation, post cholecystectomy. Pancreas: Atrophy of the body of the pancreas with dilatation of the main pancreatic duct sequela of chronic pancreatitis. Mild peripancreatic haziness, likely chronic. Correlation with pancreatic enzymes recommended to exclude recurrent pancreatitis. No abscess. Spleen: Normal in size without focal abnormality. Adrenals/Urinary Tract: The adrenal glands are unremarkable. Small nonobstructing bilateral renal calculi measure up to 3 mm in the inferior pole of the left kidney. There is no hydronephrosis on either side. There is symmetric enhancement and excretion of contrast by both kidneys. The visualized ureters and urinary bladder appear unremarkable. Stomach/Bowel: There is sigmoid diverticulosis with muscular hypertrophy. No active inflammatory changes. There is no bowel obstruction. The appendix is normal. Vascular/Lymphatic: Mild aortoiliac atherosclerotic disease. The IVC is unremarkable. There is high-grade narrowing of the porta splenic confluence due to adhesions.  The splenic vein, and main portal vein are patent. No portal  venous gas. There is no adenopathy. Upper abdominal collateral vessels noted. Reproductive: The prostate is grossly unremarkable.  No pelvic mass Other: Similar appearance of midline ventral hernias with a 20 fusion of a short segment of small bowel to the right of the midline. No obstruction. Musculoskeletal: Degenerative changes of the spine. No acute osseous pathology. IMPRESSION: 1. Sequela of chronic pancreatitis. Correlation with pancreatic enzymes recommended to exclude acute on chronic pancreatitis. No abscess. 2. Sigmoid diverticulosis. No bowel obstruction. Normal appendix. 3. Small nonobstructing bilateral renal calculi. No hydronephrosis. Electronically Signed   By: Elgie Collard M.D.   On: 06/15/2023 15:50        Scheduled Meds:  aspirin  81 mg Oral Daily   DULoxetine  60 mg Oral q morning   enoxaparin (LOVENOX) injection  40 mg Subcutaneous Q24H   fenofibrate  160 mg Oral QHS   levothyroxine  112 mcg Oral Q0600   pantoprazole  40 mg Oral BID   sucralfate  1 g Oral TID WC & HS   tamsulosin  0.4 mg Oral BID   Continuous Infusions:  lactated ringers 150 mL/hr at 06/17/23 0046     LOS: 2 days    Time spent: 55 minutes    Justin Kalman D Sherryll Burger, DO [facility]  If 7PM-7AM, please contact night-coverage [email or web address] 06/17/2023, 10:54 AM

## 2023-06-17 NOTE — Progress Notes (Signed)
 Katrinka Blazing, M.D. Gastroenterology & Hepatology   Interval History:  Patient reports somewhat better as his abdominal pain has decreased compared to admission but still presenting with diffuse abdominal pain.  No nausea, vomiting, fever, chills.  He is open to advance his diet some more later today.  Inpatient Medications:  Current Facility-Administered Medications:    aspirin chewable tablet 81 mg, 81 mg, Oral, Daily, Levie Heritage, DO, 81 mg at 06/16/23 0981   diphenhydrAMINE (BENADRYL) capsule 25 mg, 25 mg, Oral, Q6H PRN, Sherryll Burger, Pratik D, DO, 25 mg at 06/16/23 2327   DULoxetine (CYMBALTA) DR capsule 60 mg, 60 mg, Oral, q morning, Levie Heritage, DO, 60 mg at 06/16/23 0959   enoxaparin (LOVENOX) injection 40 mg, 40 mg, Subcutaneous, Q24H, Stinson, Jacob J, DO, 40 mg at 06/16/23 2319   fenofibrate tablet 160 mg, 160 mg, Oral, QHS, Levie Heritage, DO, 160 mg at 06/16/23 2318   lactated ringers infusion, , Intravenous, Continuous, Sherryll Burger, Pratik D, DO, Last Rate: 150 mL/hr at 06/17/23 0046, New Bag at 06/17/23 0046   levothyroxine (SYNTHROID) tablet 112 mcg, 112 mcg, Oral, Q0600, Levie Heritage, DO, 112 mcg at 06/17/23 1914   morphine (PF) 4 MG/ML injection 4 mg, 4 mg, Intravenous, Q3H PRN, Levie Heritage, DO, 4 mg at 06/17/23 0609   ondansetron (ZOFRAN) tablet 4 mg, 4 mg, Oral, Q6H PRN **OR** ondansetron (ZOFRAN) injection 4 mg, 4 mg, Intravenous, Q6H PRN, Levie Heritage, DO, 4 mg at 06/17/23 0808   pantoprazole (PROTONIX) EC tablet 40 mg, 40 mg, Oral, BID, Stinson, Jacob J, DO, 40 mg at 06/16/23 2318   sucralfate (CARAFATE) 1 GM/10ML suspension 1 g, 1 g, Oral, TID WC & HS, Marguerita Merles, Reuel Boom, MD, 1 g at 06/16/23 2318   tamsulosin (FLOMAX) capsule 0.4 mg, 0.4 mg, Oral, BID, Levie Heritage, DO, 0.4 mg at 06/16/23 2318   I/O    Intake/Output Summary (Last 24 hours) at 06/17/2023 1038 Last data filed at 06/17/2023 0426 Gross per 24 hour  Intake 2175 ml  Output --  Net  2175 ml     Physical Exam: Temp:  [98.1 F (36.7 C)-98.3 F (36.8 C)] 98.1 F (36.7 C) (04/06 0447) Pulse Rate:  [62-74] 62 (04/06 0447) Resp:  [18] 18 (04/06 0447) BP: (135-143)/(70-86) 135/70 (04/06 0447) SpO2:  [96 %-99 %] 99 % (04/06 0447)  Temp (24hrs), Avg:98.2 F (36.8 C), Min:98.1 F (36.7 C), Max:98.3 F (36.8 C) GENERAL: The patient is AO x3, in no acute distress. HEENT: Head is normocephalic and atraumatic. EOMI are intact. Mouth is well hydrated and without lesions. NECK: Supple. No masses LUNGS: Clear to auscultation. No presence of rhonchi/wheezing/rales. Adequate chest expansion HEART: RRR, normal s1 and s2. ABDOMEN: mildly tender upon palpation of the mid abdomen, no guarding, no peritoneal signs, and nondistended. BS +. No masses. EXTREMITIES: Without any cyanosis, clubbing, rash, lesions or edema. NEUROLOGIC: AOx3, no focal motor deficit. SKIN: no jaundice, no rashes  Laboratory Data: CBC:     Component Value Date/Time   WBC 7.4 06/16/2023 0418   RBC 4.76 06/16/2023 0418   HGB 13.7 06/16/2023 0418   HCT 41.7 06/16/2023 0418   PLT 158 06/16/2023 0418   MCV 87.6 06/16/2023 0418   MCH 28.8 06/16/2023 0418   MCHC 32.9 06/16/2023 0418   RDW 13.5 06/16/2023 0418   LYMPHSABS 0.7 03/21/2020 0745   MONOABS 0.6 03/21/2020 0745   EOSABS 0.0 03/21/2020 0745   BASOSABS 0.0 03/21/2020 0745  COAG:  Lab Results  Component Value Date   INR 1.01 11/15/2017   INR 1.18 06/25/2012   INR 0.94 05/30/2012    BMP:     Latest Ref Rng & Units 06/17/2023    4:05 AM 06/16/2023    4:18 AM 06/15/2023   12:08 PM  BMP  Glucose 70 - 99 mg/dL 91  045  409   BUN 8 - 23 mg/dL 7  13  23    Creatinine 0.61 - 1.24 mg/dL 8.11  9.14  7.82   Sodium 135 - 145 mmol/L 140  139  137   Potassium 3.5 - 5.1 mmol/L 3.5  3.7  3.8   Chloride 98 - 111 mmol/L 105  107  106   CO2 22 - 32 mmol/L 28  24  22    Calcium 8.9 - 10.3 mg/dL 8.6  8.5  9.3     HEPATIC:     Latest Ref Rng & Units  06/17/2023    4:05 AM 06/15/2023   12:08 PM 10/23/2022    4:50 PM  Hepatic Function  Total Protein 6.5 - 8.1 g/dL 6.0  7.3  8.1   Albumin 3.5 - 5.0 g/dL 3.3  3.9  4.5   AST 15 - 41 U/L 50  13  14   ALT 0 - 44 U/L 114  16  26   Alk Phosphatase 38 - 126 U/L 138  86  83   Total Bilirubin 0.0 - 1.2 mg/dL 0.7  0.6  0.7     CARDIAC:  Lab Results  Component Value Date   TROPONINI <0.30 06/28/2012      Imaging: I personally reviewed and interpreted the available labs, imaging and endoscopic files.   Assessment/Plan: Justin [name] is a 68 y.o. 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   The patient has presented exacerbation of his chronic abdominal pain.  Has not been drinking alcohol recently and denies any recent triggers for his symptoms of abdominal pain.  Episodes of nausea, he has not presented any other associated symptoms.  Cross-sectional abdominal imaging showed some mild peripancreatic haziness which could be related to exacerbation of his chronic pancreatitis.  At this point, we will recommend continue conservative management, which includes slowly advancing his diet and pain control, can try dicyclomine as needed to decrease his abdominal pain in conjunction with opiate use.  Will continue Zofran as needed to decrease his nausea.  Once he is able to advance his diet, he will need to restart his Creon.  He would like to start a low-fat soft diet during the evening.   Patient underwent MRCP that showed some dilation of his pancreatic duct with no other abnormalities.  As his lipase has normalized, we will not pursue any endoscopic evaluation at this point.  Will need to trend his LFTs daily.   There is little role for an EGD at this point, we will  hold off on this.  Will continue trial of Carafate in conjunction with PPI twice daily.   -Advance diet to soft low-fat diet in the evening -Pantoprazole 40 mg twice daily - Carafate 1 g every 6 hours - Dicyclomine as needed for abdominal pain - May use rescue doses of opiates for pain control - Zofran as needed for nausea - Restart Creon when diet is advanced to low-fat soft  consistency   Katrinka Blazing, MD Gastroenterology and Hepatology [facility]

## 2023-06-18 DIAGNOSIS — K8689 Other specified diseases of pancreas: Secondary | ICD-10-CM

## 2023-06-18 DIAGNOSIS — K861 Other chronic pancreatitis: Secondary | ICD-10-CM | POA: Diagnosis not present

## 2023-06-18 DIAGNOSIS — R748 Abnormal levels of other serum enzymes: Secondary | ICD-10-CM | POA: Diagnosis not present

## 2023-06-18 DIAGNOSIS — K859 Acute pancreatitis without necrosis or infection, unspecified: Secondary | ICD-10-CM | POA: Diagnosis not present

## 2023-06-18 LAB — COMPREHENSIVE METABOLIC PANEL WITH GFR
ALT: 87 U/L — ABNORMAL HIGH (ref 0–44)
AST: 24 U/L (ref 15–41)
Albumin: 3.6 g/dL (ref 3.5–5.0)
Alkaline Phosphatase: 133 U/L — ABNORMAL HIGH (ref 38–126)
Anion gap: 10 (ref 5–15)
BUN: 6 mg/dL — ABNORMAL LOW (ref 8–23)
CO2: 26 mmol/L (ref 22–32)
Calcium: 9.1 mg/dL (ref 8.9–10.3)
Chloride: 105 mmol/L (ref 98–111)
Creatinine, Ser: 0.71 mg/dL (ref 0.61–1.24)
GFR, Estimated: >60 mL/min (ref >60)
Glucose, Bld: 93 mg/dL (ref 70–99)
Potassium: 3.3 mmol/L — ABNORMAL LOW (ref 3.5–5.1)
Sodium: 141 mmol/L (ref 135–145)
Total Bilirubin: 0.8 mg/dL (ref 0.0–1.2)
Total Protein: 6.8 g/dL (ref 6.5–8.1)

## 2023-06-18 LAB — MAGNESIUM: Magnesium: 1.9 mg/dL (ref 1.7–2.4)

## 2023-06-18 MED ORDER — POTASSIUM CHLORIDE CRYS ER 20 MEQ PO TBCR
40.0000 meq | EXTENDED_RELEASE_TABLET | Freq: Two times a day (BID) | ORAL | Status: AC
  Administered 2023-06-18 (×2): 40 meq via ORAL
  Filled 2023-06-18 (×2): qty 2

## 2023-06-18 MED ORDER — LACTATED RINGERS IV SOLN: INTRAVENOUS | Status: DC

## 2023-06-18 NOTE — Progress Notes (Signed)
 PROGRESS NOTE    Justin [name]  NWG:[id number] DOB: [birth year] DOA: 06/15/2023 PCP: [name], MD   Brief Narrative:   Justin [name] is a 68 y.o. 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  Patient presents with abdominal pain over the last 7 days.  This has been associated with nausea, vomiting.  He has been admitted with acute on chronic pancreatitis and has a history of the same previously and has been seen at [facility] for this.  He remains on aggressive IV fluids as well as clear liquid diet and has been started on PPI as well as Carafate per GI.  MRCP with no significant findings noted.  Assessment & Plan:   Principal Problem:   Acute on chronic pancreatitis (HCC) Active Problems:   CAD (coronary artery disease) of artery bypass graft   Hypothyroidism   Elevated triglycerides with high cholesterol  Assessment and Plan:   Acute on chronic pancreatitis Pain control IV fluids Back down to full liquid diet GI consult appreciated with MRCP demonstrating no acute findings Noted to have pruritus with IV pain medications, will order Benadryl Change pain medication of fentanyl and hopefully this will be better tolerated Coronary artery disease   Hypothyroidism Continue Synthroid Chronic pain   DVT prophylaxis:Lovenox Code Status: Full Family Communication: Spoke with daughter on phone/6 Disposition Plan:  Status is: Inpatient Remains inpatient appropriate because: Need for IV medications and fluids.   Consultants:  GI  Procedures:  None  Antimicrobials:  None   Subjective: Patient seen and evaluated today with some ongoing nausea and vomiting.  He is also having abdominal pain and was noted to have an allergic reaction with hives after being given morphine yesterday.  Objective: Vitals:   06/17/23 1802 06/17/23 2036 06/18/23 0353 06/18/23 0902   BP: (!) 154/82 (!) 165/88 102/76 (!) 148/97  Pulse: 72 97 89 88  Resp: 18 18 18 19   Temp: 98.8 F (37.1 C) 98.9 F (37.2 C) 98.1 F (36.7 C) 98.2 F (36.8 C)  TempSrc: Oral Oral Oral Oral  SpO2: 93% 97% 98%   Weight:      Height:        Intake/Output Summary (Last 24 hours) at 06/18/2023 1010 Last data filed at 06/18/2023 0900 Gross per 24 hour  Intake 720 ml  Output --  Net 720 ml   Filed Weights   06/15/23 1154  Weight: 70.8 kg    Examination:  General exam: Appears calm and comfortable  Respiratory system: Clear to auscultation. Respiratory effort normal. Cardiovascular system: S1 & S2 heard, RRR.  Gastrointestinal system: Abdomen is soft Central nervous system: Alert and awake Extremities: No edema Skin: No significant lesions noted Psychiatry: Flat affect.    Data Reviewed: I have personally reviewed following labs and imaging studies  CBC: Recent Labs  Lab 06/15/23 1208 06/16/23 0418  WBC 6.1 7.4  HGB 14.4 13.7  HCT 43.2 41.7  MCV 85.7 87.6  PLT 179 158   Basic Metabolic Panel: Recent Labs  Lab 06/15/23 1208 06/16/23 0418 06/17/23 0405 06/18/23 0437  NA 137 139 140 141  K 3.8 3.7 3.5 3.3*  CL 106 107 105 105  CO2 22 24 28 26   GLUCOSE 169* 108* 91 93  BUN 23 13 7* 6*  CREATININE 0.73 0.65 0.72 0.71  CALCIUM 9.3 8.5* 8.6* 9.1  MG  --   --  1.9 1.9  GFR: Estimated Creatinine Clearance: 82.6 mL/min (by C-G formula based on SCr of 0.71 mg/dL). Liver Function Tests: Recent Labs  Lab 06/15/23 1208 06/17/23 0405 06/18/23 0437  AST 13* 50* 24  ALT 16 114* 87*  ALKPHOS 86 138* 133*  BILITOT 0.6 0.7 0.8  PROT 7.3 6.0* 6.8  ALBUMIN 3.9 3.3* 3.6   Recent Labs  Lab 06/15/23 1208 06/16/23 0418  LIPASE 74* 36   No results for input(s): "AMMONIA" in the last 168 hours. Coagulation Profile: No results for input(s): "INR", "PROTIME" in the last 168 hours. Cardiac Enzymes: No results for input(s): "CKTOTAL", "CKMB", "CKMBINDEX",  "TROPONINI" in the last 168 hours. BNP (last 3 results) No results for input(s): "PROBNP" in the last 8760 hours. HbA1C: No results for input(s): "HGBA1C" in the last 72 hours. CBG: No results for input(s): "GLUCAP" in the last 168 hours. Lipid Profile: No results for input(s): "CHOL", "HDL", "LDLCALC", "TRIG", "CHOLHDL", "LDLDIRECT" in the last 72 hours. Thyroid Function Tests: No results for input(s): "TSH", "T4TOTAL", "FREET4", "T3FREE", "THYROIDAB" in the last 72 hours. Anemia Panel: No results for input(s): "VITAMINB12", "FOLATE", "FERRITIN", "TIBC", "IRON", "RETICCTPCT" in the last 72 hours. Sepsis Labs: No results for input(s): "PROCALCITON", "LATICACIDVEN" in the last 168 hours.  No results found for this or any previous visit (from the past 240 hours).       Radiology Studies: MR ABDOMEN MRCP W WO CONTAST Result Date: 06/16/2023 CLINICAL DATA:  Acute on chronic pancreatitis. EXAM: MRI ABDOMEN WITHOUT AND WITH CONTRAST (INCLUDING MRCP) TECHNIQUE: Multiplanar multisequence MR imaging of the abdomen was performed both before and after the administration of intravenous contrast. Heavily T2-weighted images of the biliary and pancreatic ducts were obtained, and three-dimensional MRCP images were rendered by post processing. CONTRAST:  7mL GADAVIST GADOBUTROL 1 MMOL/ML IV SOLN COMPARISON:  CT on 06/15/2023 FINDINGS: Lower chest: No acute findings. Hepatobiliary: No hepatic masses identified. Prior cholecystectomy noted. Mild dilatation of intrahepatic bile ducts is seen, while the common duct measures 7 mm in diameter which is within normal limits. No evidence of choledocholithiasis or biliary stricture. Pancreas: Atrophy and ductal dilatation is seen involving the pancreatic body and tail. Dilated pancreatic duct terminates in the pancreatic neck, where there is evidence fibrosis and tethering of the posterior wall of the distal stomach. No masses seen at this site. No evidence of  pancreatic edema or acute peripancreatic inflammatory changes. No abnormal fluid collections are identified. Spleen:  Within normal limits in size and appearance. Adrenals/Urinary Tract: No suspicious masses identified. No evidence of hydronephrosis. Stomach/Bowel: Diverticulosis involving the visualized portion of the descending colon, without signs of diverticulitis in this region. Vascular/Lymphatic: No pathologically enlarged lymph nodes identified. No acute vascular findings. Other: 2 small left paraumbilical ventral hernias are seen which contain only fat. A small right paraumbilical ventral hernia is seen which contains a small bowel loop. No evidence of bowel obstruction. Musculoskeletal:  No suspicious bone lesions identified. IMPRESSION: Atrophy and pancreatic ductal dilatation involving the body and tail, with fibrosis and tethering of pancreatic neck to the adjacent distal stomach. This may be due to sequelae of chronic pancreatitis or prior traumatic injury. No radiographic evidence of acute pancreatitis or mass. Prior cholecystectomy. Mild intrahepatic biliary ductal dilatation, without evidence of choledocholithiasis or biliary stricture. Small right paraumbilical ventral hernia which contains a small bowel loop. No evidence of bowel obstruction. Two small left paraumbilical ventral hernias which contain only fat. Colonic diverticulosis, without radiographic evidence of diverticulitis. Electronically Signed   By: Mayra Neer  Eppie Gibson M.D.   On: 06/16/2023 16:30   MR 3D Recon At Scanner Result Date: 06/16/2023 CLINICAL DATA:  Acute on chronic pancreatitis. EXAM: MRI ABDOMEN WITHOUT AND WITH CONTRAST (INCLUDING MRCP) TECHNIQUE: Multiplanar multisequence MR imaging of the abdomen was performed both before and after the administration of intravenous contrast. Heavily T2-weighted images of the biliary and pancreatic ducts were obtained, and three-dimensional MRCP images were rendered by post processing.  CONTRAST:  7mL GADAVIST GADOBUTROL 1 MMOL/ML IV SOLN COMPARISON:  CT on 06/15/2023 FINDINGS: Lower chest: No acute findings. Hepatobiliary: No hepatic masses identified. Prior cholecystectomy noted. Mild dilatation of intrahepatic bile ducts is seen, while the common duct measures 7 mm in diameter which is within normal limits. No evidence of choledocholithiasis or biliary stricture. Pancreas: Atrophy and ductal dilatation is seen involving the pancreatic body and tail. Dilated pancreatic duct terminates in the pancreatic neck, where there is evidence fibrosis and tethering of the posterior wall of the distal stomach. No masses seen at this site. No evidence of pancreatic edema or acute peripancreatic inflammatory changes. No abnormal fluid collections are identified. Spleen:  Within normal limits in size and appearance. Adrenals/Urinary Tract: No suspicious masses identified. No evidence of hydronephrosis. Stomach/Bowel: Diverticulosis involving the visualized portion of the descending colon, without signs of diverticulitis in this region. Vascular/Lymphatic: No pathologically enlarged lymph nodes identified. No acute vascular findings. Other: 2 small left paraumbilical ventral hernias are seen which contain only fat. A small right paraumbilical ventral hernia is seen which contains a small bowel loop. No evidence of bowel obstruction. Musculoskeletal:  No suspicious bone lesions identified. IMPRESSION: Atrophy and pancreatic ductal dilatation involving the body and tail, with fibrosis and tethering of pancreatic neck to the adjacent distal stomach. This may be due to sequelae of chronic pancreatitis or prior traumatic injury. No radiographic evidence of acute pancreatitis or mass. Prior cholecystectomy. Mild intrahepatic biliary ductal dilatation, without evidence of choledocholithiasis or biliary stricture. Small right paraumbilical ventral hernia which contains a small bowel loop. No evidence of bowel  obstruction. Two small left paraumbilical ventral hernias which contain only fat. Colonic diverticulosis, without radiographic evidence of diverticulitis. Electronically Signed   By: Danae Orleans M.D.   On: 06/16/2023 16:30        Scheduled Meds:  aspirin  81 mg Oral Daily   clonazePAM  0.5 mg Oral QHS   DULoxetine  60 mg Oral q morning   enoxaparin (LOVENOX) injection  40 mg Subcutaneous Q24H   fenofibrate  160 mg Oral QHS   levothyroxine  112 mcg Oral Q0600   lipase/protease/amylase  12,000 Units Oral With snacks   lipase/protease/amylase  24,000 Units Oral TID WC   pantoprazole  40 mg Oral BID   potassium chloride  40 mEq Oral BID   sucralfate  1 g Oral TID WC & HS   tamsulosin  0.4 mg Oral BID   Continuous Infusions:  lactated ringers       LOS: 3 days    Time spent: 55 minutes    Rickie Gange Hoover Brunette, DO [facility]  If 7PM-7AM, please contact night-coverage [email or web address] 06/18/2023, 10:10 AM

## 2023-06-18 NOTE — Plan of Care (Signed)
   Problem: Education: Goal: Knowledge of General Education information will improve Description Including pain rating scale, medication(s)/side effects and non-pharmacologic comfort measures Outcome: Progressing   Problem: Education: Goal: Knowledge of General Education information will improve Description Including pain rating scale, medication(s)/side effects and non-pharmacologic comfort measures Outcome: Progressing

## 2023-06-18 NOTE — Progress Notes (Addendum)
 Gastroenterology Progress Note   Primary Gastroenterologist:  [facility]  Patient ID: Justin [name]; [id number]; May 22, 1955    Subjective   Tried to eat eggs and oatmeal this morning with recurrent pain and vomiting. Did not eat dinner yesterday evening. Wife states usually takes about 7 days of clears/full liquids till able to advance diet. +flatus. BM yesterday evening. Has not been getting creon during meals. States either before or after. Abdominal pain waxing and waning, worse with eating.    Objective   Vital signs in last 24 hours Temp:  [98.1 F (36.7 C)-98.9 F (37.2 C)] 98.1 F (36.7 C) (04/07 0353) Pulse Rate:  [63-97] 89 (04/07 0353) Resp:  [17-22] 18 (04/07 0353) BP: (102-194)/(76-105) 102/76 (04/07 0353) SpO2:  [93 %-99 %] 98 % (04/07 0353) Last BM Date : 06/14/23  Physical Exam General:   Alert and oriented, pleasant Head:  Normocephalic and atraumatic. Abdomen:  Bowel sounds present, soft, TTP LUQ and epigastric, no rebound or guarding. No distension. Neurologic:  Alert and  oriented x4   Intake/Output from previous day: 04/06 0701 - 04/07 0700 In: 480 [P.O.:480] Out: -  Intake/Output this shift: No intake/output data recorded.  Lab Results  Recent Labs    06/15/23 1208 06/16/23 0418  WBC 6.1 7.4  HGB 14.4 13.7  HCT 43.2 41.7  PLT 179 158   BMET Recent Labs    06/16/23 0418 06/17/23 0405 06/18/23 0437  NA 139 140 141  K 3.7 3.5 3.3*  CL 107 105 105  CO2 24 28 26   GLUCOSE 108* 91 93  BUN 13 7* 6*  CREATININE 0.65 0.72 0.71  CALCIUM 8.5* 8.6* 9.1   LFT Recent Labs    06/15/23 1208 06/17/23 0405 06/18/23 0437  PROT 7.3 6.0* 6.8  ALBUMIN 3.9 3.3* 3.6  AST 13* 50* 24  ALT 16 114* 87*  ALKPHOS 86 138* 133*  BILITOT 0.6 0.7 0.8    Studies/Results MR ABDOMEN MRCP W WO CONTAST Result Date: 06/16/2023 CLINICAL DATA:  Acute on chronic pancreatitis. EXAM: MRI ABDOMEN WITHOUT AND WITH CONTRAST (INCLUDING MRCP) TECHNIQUE: Multiplanar  multisequence MR imaging of the abdomen was performed both before and after the administration of intravenous contrast. Heavily T2-weighted images of the biliary and pancreatic ducts were obtained, and three-dimensional MRCP images were rendered by post processing. CONTRAST:  7mL GADAVIST GADOBUTROL 1 MMOL/ML IV SOLN COMPARISON:  CT on 06/15/2023 FINDINGS: Lower chest: No acute findings. Hepatobiliary: No hepatic masses identified. Prior cholecystectomy noted. Mild dilatation of intrahepatic bile ducts is seen, while the common duct measures 7 mm in diameter which is within normal limits. No evidence of choledocholithiasis or biliary stricture. Pancreas: Atrophy and ductal dilatation is seen involving the pancreatic body and tail. Dilated pancreatic duct terminates in the pancreatic neck, where there is evidence fibrosis and tethering of the posterior wall of the distal stomach. No masses seen at this site. No evidence of pancreatic edema or acute peripancreatic inflammatory changes. No abnormal fluid collections are identified. Spleen:  Within normal limits in size and appearance. Adrenals/Urinary Tract: No suspicious masses identified. No evidence of hydronephrosis. Stomach/Bowel: Diverticulosis involving the visualized portion of the descending colon, without signs of diverticulitis in this region. Vascular/Lymphatic: No pathologically enlarged lymph nodes identified. No acute vascular findings. Other: 2 small left paraumbilical ventral hernias are seen which contain only fat. A small right paraumbilical ventral hernia is seen which contains a small bowel loop. No evidence of bowel obstruction. Musculoskeletal:  No suspicious bone lesions  identified. IMPRESSION: Atrophy and pancreatic ductal dilatation involving the body and tail, with fibrosis and tethering of pancreatic neck to the adjacent distal stomach. This may be due to sequelae of chronic pancreatitis or prior traumatic injury. No radiographic evidence of  acute pancreatitis or mass. Prior cholecystectomy. Mild intrahepatic biliary ductal dilatation, without evidence of choledocholithiasis or biliary stricture. Small right paraumbilical ventral hernia which contains a small bowel loop. No evidence of bowel obstruction. Two small left paraumbilical ventral hernias which contain only fat. Colonic diverticulosis, without radiographic evidence of diverticulitis. Electronically Signed   By: Danae Orleans M.D.   On: 06/16/2023 16:30   MR 3D Recon At Scanner Result Date: 06/16/2023 CLINICAL DATA:  Acute on chronic pancreatitis. EXAM: MRI ABDOMEN WITHOUT AND WITH CONTRAST (INCLUDING MRCP) TECHNIQUE: Multiplanar multisequence MR imaging of the abdomen was performed both before and after the administration of intravenous contrast. Heavily T2-weighted images of the biliary and pancreatic ducts were obtained, and three-dimensional MRCP images were rendered by post processing. CONTRAST:  7mL GADAVIST GADOBUTROL 1 MMOL/ML IV SOLN COMPARISON:  CT on 06/15/2023 FINDINGS: Lower chest: No acute findings. Hepatobiliary: No hepatic masses identified. Prior cholecystectomy noted. Mild dilatation of intrahepatic bile ducts is seen, while the common duct measures 7 mm in diameter which is within normal limits. No evidence of choledocholithiasis or biliary stricture. Pancreas: Atrophy and ductal dilatation is seen involving the pancreatic body and tail. Dilated pancreatic duct terminates in the pancreatic neck, where there is evidence fibrosis and tethering of the posterior wall of the distal stomach. No masses seen at this site. No evidence of pancreatic edema or acute peripancreatic inflammatory changes. No abnormal fluid collections are identified. Spleen:  Within normal limits in size and appearance. Adrenals/Urinary Tract: No suspicious masses identified. No evidence of hydronephrosis. Stomach/Bowel: Diverticulosis involving the visualized portion of the descending colon, without signs  of diverticulitis in this region. Vascular/Lymphatic: No pathologically enlarged lymph nodes identified. No acute vascular findings. Other: 2 small left paraumbilical ventral hernias are seen which contain only fat. A small right paraumbilical ventral hernia is seen which contains a small bowel loop. No evidence of bowel obstruction. Musculoskeletal:  No suspicious bone lesions identified. IMPRESSION: Atrophy and pancreatic ductal dilatation involving the body and tail, with fibrosis and tethering of pancreatic neck to the adjacent distal stomach. This may be due to sequelae of chronic pancreatitis or prior traumatic injury. No radiographic evidence of acute pancreatitis or mass. Prior cholecystectomy. Mild intrahepatic biliary ductal dilatation, without evidence of choledocholithiasis or biliary stricture. Small right paraumbilical ventral hernia which contains a small bowel loop. No evidence of bowel obstruction. Two small left paraumbilical ventral hernias which contain only fat. Colonic diverticulosis, without radiographic evidence of diverticulitis. Electronically Signed   By: Danae Orleans M.D.   On: 06/16/2023 16:30   CT ABDOMEN PELVIS W CONTRAST Result Date: 06/15/2023 CLINICAL DATA:  Abdominal pain.  History of pancreatitis. EXAM: CT ABDOMEN AND PELVIS WITH CONTRAST TECHNIQUE: Multidetector CT imaging of the abdomen and pelvis was performed using the standard protocol following bolus administration of intravenous contrast. RADIATION DOSE REDUCTION: This exam was performed according to the departmental dose-optimization program which includes automated exposure control, adjustment of the mA and/or kV according to patient size and/or use of iterative reconstruction technique. CONTRAST:  OMNIPAQUE IOHEXOL 300 MG/ML  SOLN COMPARISON:  CT abdomen pelvis dated 10/23/2022. FINDINGS: Lower chest: Bibasilar subpleural atelectasis/scarring. The visualized lung bases are otherwise clear. No intra-abdominal free  air or free fluid. Hepatobiliary: The liver  is unremarkable. There is biliary dilatation, post cholecystectomy. Pancreas: Atrophy of the body of the pancreas with dilatation of the main pancreatic duct sequela of chronic pancreatitis. Mild peripancreatic haziness, likely chronic. Correlation with pancreatic enzymes recommended to exclude recurrent pancreatitis. No abscess. Spleen: Normal in size without focal abnormality. Adrenals/Urinary Tract: The adrenal glands are unremarkable. Small nonobstructing bilateral renal calculi measure up to 3 mm in the inferior pole of the left kidney. There is no hydronephrosis on either side. There is symmetric enhancement and excretion of contrast by both kidneys. The visualized ureters and urinary bladder appear unremarkable. Stomach/Bowel: There is sigmoid diverticulosis with muscular hypertrophy. No active inflammatory changes. There is no bowel obstruction. The appendix is normal. Vascular/Lymphatic: Mild aortoiliac atherosclerotic disease. The IVC is unremarkable. There is high-grade narrowing of the porta splenic confluence due to adhesions. The splenic vein, and main portal vein are patent. No portal venous gas. There is no adenopathy. Upper abdominal collateral vessels noted. Reproductive: The prostate is grossly unremarkable.  No pelvic mass Other: Similar appearance of midline ventral hernias with a 20 fusion of a short segment of small bowel to the right of the midline. No obstruction. Musculoskeletal: Degenerative changes of the spine. No acute osseous pathology. IMPRESSION: 1. Sequela of chronic pancreatitis. Correlation with pancreatic enzymes recommended to exclude acute on chronic pancreatitis. No abscess. 2. Sigmoid diverticulosis. No bowel obstruction. Normal appendix. 3. Small nonobstructing bilateral renal calculi. No hydronephrosis. Electronically Signed   By: Elgie Collard M.D.   On: 06/15/2023 15:50    Assessment  68 y.o. 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   Acute on chronic abdominal pain: CT with mild peripancreatic haziness, lipase mildly elevated at 74 on admission, transaminases normal on presentation with slight bump in AST and ALT to 50 and 114 yesterday respectively, now with normalized AST and ALT improved to 87, ALk Phos mildly elevated 133. Tbili remaining normal. MRCP yesterday with pancreatic atrophy and pancreatic ductal dilatation with fibrosis and tethering of pancreatic neck to adjacent distal stomach. No CBD stones or stricture. Gallbladder absent. Worsening abdominal pain with soft food this morning. I note he has not been receiving PERT at appropriate dosing times. Will discuss this with nursing. Can continue the 24,000 units with meals (backing down to full liquids) and will increase to home-based dosing once at soft diet.    He is followed by [facility] with consideration for ERCP if worsening increase in baseline pain. Recommend follow-up as outpatient with [facility] for further management.     Plan / Recommendations  Back down to full liquid diet. Agree with resuming IV fluids.  PPI BID, continue Carafate Creon weight based dosing when advanced to low fat diet. Continue 24,000 units with meals as backing down to fulls and less fat intake Follow-up closely at [facility]. May need ERCP. No need for endoscopic intervention while inpatient.  Follow HFP Discussed with nursing importance of giving Creon while eating, not before or after due to timing of activation with eating.     LOS: 3 days    06/18/2023, 8:49 AM  Gelene Mink, PhD, [facility]astroenter[facility]

## 2023-06-18 NOTE — Progress Notes (Signed)
 Nurse at bedside,patient report that his pain was a 7 now to his abdomen and the nausea had resolved. Snacks given at this time,patient had applesauce with his creon capsule 12,000 units by mouth per [facility] order. Plan of care on going.

## 2023-06-19 DIAGNOSIS — K859 Acute pancreatitis without necrosis or infection, unspecified: Secondary | ICD-10-CM | POA: Diagnosis not present

## 2023-06-19 DIAGNOSIS — K861 Other chronic pancreatitis: Secondary | ICD-10-CM | POA: Diagnosis not present

## 2023-06-19 LAB — MAGNESIUM: Magnesium: 1.8 mg/dL (ref 1.7–2.4)

## 2023-06-19 LAB — COMPREHENSIVE METABOLIC PANEL WITH GFR
ALT: 69 U/L — ABNORMAL HIGH (ref 0–44)
AST: 22 U/L (ref 15–41)
Albumin: 3.3 g/dL — ABNORMAL LOW (ref 3.5–5.0)
Alkaline Phosphatase: 111 U/L (ref 38–126)
Anion gap: 8 (ref 5–15)
BUN: 6 mg/dL — ABNORMAL LOW (ref 8–23)
CO2: 26 mmol/L (ref 22–32)
Calcium: 8.9 mg/dL (ref 8.9–10.3)
Chloride: 107 mmol/L (ref 98–111)
Creatinine, Ser: 0.8 mg/dL (ref 0.61–1.24)
GFR, Estimated: >60 mL/min (ref >60)
Glucose, Bld: 107 mg/dL — ABNORMAL HIGH (ref 70–99)
Potassium: 3.9 mmol/L (ref 3.5–5.1)
Sodium: 141 mmol/L (ref 135–145)
Total Bilirubin: 0.9 mg/dL (ref 0.0–1.2)
Total Protein: 6.3 g/dL — ABNORMAL LOW (ref 6.5–8.1)

## 2023-06-19 LAB — CBC
HCT: 40.2 % (ref 39.0–52.0)
Hemoglobin: 13.5 g/dL (ref 13.0–17.0)
MCH: 29.2 pg (ref 26.0–34.0)
MCHC: 33.6 g/dL (ref 30.0–36.0)
MCV: 86.8 fL (ref 80.0–100.0)
Platelets: 187 10*3/uL (ref 150–400)
RBC: 4.63 MIL/uL (ref 4.22–5.81)
RDW: 13.8 % (ref 11.5–15.5)
WBC: 5 10*3/uL (ref 4.0–10.5)
nRBC: 0 % (ref 0.0–0.2)

## 2023-06-19 MED ORDER — PANCRELIPASE (LIP-PROT-AMYL) 12000-38000 UNITS PO CPEP
36000.0000 [IU] | ORAL_CAPSULE | ORAL | Status: DC
  Administered 2023-06-20 (×2): 36000 [IU] via ORAL
  Filled 2023-06-19 (×3): qty 3

## 2023-06-19 MED ORDER — POLYETHYLENE GLYCOL 3350 17 G PO PACK
17.0000 g | PACK | Freq: Every day | ORAL | Status: DC
  Administered 2023-06-19: 17 g via ORAL
  Filled 2023-06-19: qty 1

## 2023-06-19 MED ORDER — PANCRELIPASE (LIP-PROT-AMYL) 12000-38000 UNITS PO CPEP
72000.0000 [IU] | ORAL_CAPSULE | Freq: Three times a day (TID) | ORAL | Status: DC
  Administered 2023-06-19 – 2023-06-23 (×12): 72000 [IU] via ORAL
  Filled 2023-06-19 (×12): qty 6

## 2023-06-19 MED ORDER — POLYETHYLENE GLYCOL 3350 17 G PO PACK
17.0000 g | PACK | Freq: Two times a day (BID) | ORAL | Status: DC
  Administered 2023-06-19 – 2023-06-20 (×2): 17 g via ORAL
  Filled 2023-06-19 (×2): qty 1

## 2023-06-19 NOTE — Plan of Care (Signed)
  Problem: Health Behavior/Discharge Planning: Goal: Ability to manage health-related needs will improve Outcome: Progressing   Problem: Clinical Measurements: Goal: Ability to maintain clinical measurements within normal limits will improve Outcome: Progressing Goal: Will remain free from infection Outcome: Progressing Goal: Diagnostic test results will improve Outcome: Progressing Goal: Respiratory complications will improve Outcome: Progressing Goal: Cardiovascular complication will be avoided Outcome: Progressing   Problem: Activity: Goal: Risk for activity intolerance will decrease Outcome: Progressing   Problem: Nutrition: Goal: Adequate nutrition will be maintained Outcome: Progressing   Problem: Coping: Goal: Level of anxiety will decrease Outcome: Progressing   Problem: Pain Managment: Goal: General experience of comfort will improve and/or be controlled Outcome: Progressing   Problem: Safety: Goal: Ability to remain free from injury will improve Outcome: Progressing   Problem: Skin Integrity: Goal: Risk for impaired skin integrity will decrease Outcome: Progressing

## 2023-06-19 NOTE — Progress Notes (Signed)
 Subjective: Constant 8/10 abdominal pain. Increasing intermittent 10/10 sharp stabbing pain intermittently. Not specifically related to eating. No nausea/vomiting today. Had a BM this morning, small, hard. States he needs something to help with his bowels. Thinks this is contributing to his abdominal pain.   Had juice this morning. Oatmeal offered, but didn't bring anything to put on it, so didn't eat it. Had soup last night.   Wife states it takes more than a week usually to improve/advance diet.   Denies alcohol. Not sure if he takes NSAIDs.    Objective: Vital signs in last 24 hours: Temp:  [98 F (36.7 C)-99.3 F (37.4 C)] 98.2 F (36.8 C) (04/08 1244) Pulse Rate:  [65-107] 76 (04/08 1244) Resp:  [17-20] 17 (04/08 1244) BP: (115-148)/(68-101) 124/70 (04/08 1244) SpO2:  [95 %-100 %] 96 % (04/08 1244) Last BM Date : 06/14/23 General:   Alert and oriented, pleasant, NAD.  Head:  Normocephalic and atraumatic. Eyes:  No icterus, sclera clear. Conjuctiva pink.  Abdomen:  Bowel sounds present, soft, non-distended. Mild to moderate generalized TTP greatest in epigastric and LUQ regions. No rebound or guarding. No masses appreciated  Msk:  Symmetrical without gross deformities. Normal posture. Extremities:  Without edema. Neurologic:  Alert and  oriented x4;  grossly normal neurologically. Skin:  Warm and dry, intact without significant lesions.  Psych:  Normal mood and affect.  Intake/Output from previous day: 04/07 0701 - 04/08 0700 In: 1560.3 [P.O.:980; I.V.:580.3] Out: -  Intake/Output this shift: Total I/O In: 300 [P.O.:300] Out: -   Lab Results: Recent Labs    06/19/23 0424  WBC 5.0  HGB 13.5  HCT 40.2  PLT 187   BMET Recent Labs    06/17/23 0405 06/18/23 0437 06/19/23 0424  NA 140 141 141  K 3.5 3.3* 3.9  CL 105 105 107  CO2 28 26 26   GLUCOSE 91 93 107*  BUN 7* 6* 6*  CREATININE 0.72 0.71 0.80  CALCIUM 8.6* 9.1 8.9   LFT Recent Labs     06/17/23 0405 06/18/23 0437 06/19/23 0424  PROT 6.0* 6.8 6.3*  ALBUMIN 3.3* 3.6 3.3*  AST 50* 24 22  ALT 114* 87* 69*  ALKPHOS 138* 133* 111  BILITOT 0.7 0.8 0.9     Assessment: 68 y.o. 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   Acute on chronic abdominal pain:  CT with mild peripancreatic haziness, lipase mildly elevated at 74 on admission, transaminases normal on presentation with slight bump in LFTs with AST 50, ALT 114, alk phos 138 on 4/6, now with AST normal, ALT 69, alk phos normal.  T. bili remaining within normal limits.  MRCP/6 showed pancreatic atrophy, pancreatic ductal dilation with fibrosis and tethering of pancreatic neck to adjacent distal stomach.  No pancreatic edema or acute peripancreatic inflammatory changes.  No CBD stones or stricture.  Gallbladder absent.  Patient reports symptoms initially started to improve, but noticed some increased abdominal pain yesterday and diet was backed down to full liquids.  Unfortunately, he has not had any improvement reporting increasing frequency of intermittent 10 out of 10 very short-lived stabbing abdominal pain, but continues with baseline 8 out of 10 constant pain and has been receiving fentanyl every 2-3 hours. At this point, will decrease to clear  liquids. Also increasing creon and adding MiraLAX as he isn't having adequate bowel movements which may also be contributing to his abdominal pain.    Of note, he is followed by [facility] with consideration for ERCP if worsening increase in baseline pain. Recommend follow-up as outpatient with [facility] for further management. No need for inpatient ERCP at this time.     Plan: Clear liquid diet.  Increase Creon to 72000 units with meals TID and  40981 units with snacks BID.  Continue PPI BID Continue carafate QID Start MiraLAX 17g daily.    LOS: 4 days    06/19/2023, 1:18 PM   Ermalinda Memos, [facility]

## 2023-06-19 NOTE — Progress Notes (Signed)
 Nurse at bedside,patient alert and oriented times four.Patient did c/o pain to abdomen rated a 8 sharp off and on pain.Plan of care on going.

## 2023-06-19 NOTE — Progress Notes (Signed)
 PROGRESS NOTE    Justin [name]  XBJ:[id number] DOB: 03/12/1956 DOA: 06/15/2023 PCP: [name], MD   Brief Narrative:   Justin [name] is a 68 y.o. 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  Patient presents with abdominal pain over the last 7 days.  This has been associated with nausea, vomiting.  He has been admitted with acute on chronic pancreatitis and has a history of the same previously and has been seen at [facility] for this.  He remains on aggressive IV fluids as well as clear liquid diet and has been started on PPI as well as Carafate per GI.  MRCP with no significant findings noted.  Continues to remain on full liquid diet and is still quite symptomatic.  GI continues to follow.  Assessment & Plan:   Principal Problem:   Acute on chronic pancreatitis (HCC) Active Problems:   CAD (coronary artery disease) of artery bypass graft   Hypothyroidism   Elevated triglycerides with high cholesterol  Assessment and Plan:   Acute on chronic pancreatitis Pain control IV fluids Continue full liquid diet for now with ongoing symptomatology GI consult appreciated with MRCP demonstrating no acute findings Noted to have pruritus with IV pain medications, will order Benadryl Change pain medication of fentanyl which appears to be better tolerated Coronary artery disease   Hypothyroidism Continue Synthroid Chronic pain   DVT prophylaxis:Lovenox Code Status: Full Family Communication: Spoke with daughter on phone 4/6 Disposition Plan: Discharge once able to tolerate diet without any further symptomatology Status is: Inpatient Remains inpatient appropriate because: Need for IV medications and fluids.   Consultants:  GI  Procedures:  None  Antimicrobials:  None   Subjective: Patient seen and evaluated today with some ongoing nausea and decreased amounts of  vomiting.  Continues to have some significant pain and only tolerates small amounts of full liquid diet.  Objective: Vitals:   06/18/23 2339 06/19/23 0027 06/19/23 0307 06/19/23 0828  BP: 134/68 (!) 148/69 139/76 129/81  Pulse: 65 (!) 107 67 65  Resp: 18  20 18   Temp: 99.1 F (37.3 C) 98 F (36.7 C) 98.3 F (36.8 C) 99.2 F (37.3 C)  TempSrc: Oral Oral Oral Oral  SpO2: 97% 100% 95% 96%  Weight:      Height:        Intake/Output Summary (Last 24 hours) at 06/19/2023 1030 Last data filed at 06/19/2023 0900 Gross per 24 hour  Intake 1620.3 ml  Output --  Net 1620.3 ml   Filed Weights   06/15/23 1154  Weight: 70.8 kg    Examination:  General exam: Appears calm and comfortable  Respiratory system: Clear to auscultation. Respiratory effort normal. Cardiovascular system: S1 & S2 heard, RRR.  Gastrointestinal system: Abdomen is soft Central nervous system: Alert and awake Extremities: No edema Skin: No significant lesions noted Psychiatry: Flat affect.    Data Reviewed: I have personally reviewed following labs and imaging studies  CBC: Recent Labs  Lab 06/15/23 1208 06/16/23 0418 06/19/23 0424  WBC 6.1 7.4 5.0  HGB 14.4 13.7 13.5  HCT 43.2 41.7 40.2  MCV 85.7 87.6 86.8  PLT 179 158 187   Basic Metabolic Panel: Recent Labs  Lab 06/15/23 1208 06/16/23 0418 06/17/23 0405 06/18/23 0437 06/19/23 0424  NA 137 139 140 141 141  K 3.8 3.7 3.5 3.3* 3.9  CL 106 107 105 105 107  CO2 22 24  28 26 26   GLUCOSE 169* 108* 91 93 107*  BUN 23 13 7* 6* 6*  CREATININE 0.73 0.65 0.72 0.71 0.80  CALCIUM 9.3 8.5* 8.6* 9.1 8.9  MG  --   --  1.9 1.9 1.8   GFR: Estimated Creatinine Clearance: 82.6 mL/min (by C-G formula based on SCr of 0.8 mg/dL). Liver Function Tests: Recent Labs  Lab 06/15/23 1208 06/17/23 0405 06/18/23 0437 06/19/23 0424  AST 13* 50* 24 22  ALT 16 114* 87* 69*  ALKPHOS 86 138* 133* 111  BILITOT 0.6 0.7 0.8 0.9  PROT 7.3 6.0* 6.8 6.3*  ALBUMIN 3.9  3.3* 3.6 3.3*   Recent Labs  Lab 06/15/23 1208 06/16/23 0418  LIPASE 74* 36   No results for input(s): "AMMONIA" in the last 168 hours. Coagulation Profile: No results for input(s): "INR", "PROTIME" in the last 168 hours. Cardiac Enzymes: No results for input(s): "CKTOTAL", "CKMB", "CKMBINDEX", "TROPONINI" in the last 168 hours. BNP (last 3 results) No results for input(s): "PROBNP" in the last 8760 hours. HbA1C: No results for input(s): "HGBA1C" in the last 72 hours. CBG: No results for input(s): "GLUCAP" in the last 168 hours. Lipid Profile: No results for input(s): "CHOL", "HDL", "LDLCALC", "TRIG", "CHOLHDL", "LDLDIRECT" in the last 72 hours. Thyroid Function Tests: No results for input(s): "TSH", "T4TOTAL", "FREET4", "T3FREE", "THYROIDAB" in the last 72 hours. Anemia Panel: No results for input(s): "VITAMINB12", "FOLATE", "FERRITIN", "TIBC", "IRON", "RETICCTPCT" in the last 72 hours. Sepsis Labs: No results for input(s): "PROCALCITON", "LATICACIDVEN" in the last 168 hours.  No results found for this or any previous visit (from the past 240 hours).       Radiology Studies: No results found.       Scheduled Meds:  aspirin  81 mg Oral Daily   clonazePAM  0.5 mg Oral QHS   DULoxetine  60 mg Oral q morning   enoxaparin (LOVENOX) injection  40 mg Subcutaneous Q24H   fenofibrate  160 mg Oral QHS   levothyroxine  112 mcg Oral Q0600   lipase/protease/amylase  12,000 Units Oral With snacks   lipase/protease/amylase  24,000 Units Oral TID WC   pantoprazole  40 mg Oral BID   sucralfate  1 g Oral TID WC & HS   tamsulosin  0.4 mg Oral BID   Continuous Infusions:  lactated ringers 100 mL/hr at 06/18/23 2143     LOS: 4 days    Time spent: 55 minutes    Ein Rijo Hoover Brunette, DO [facility]  If 7PM-7AM, please contact night-coverage [email or web address] 06/19/2023, 10:30 AM

## 2023-06-19 NOTE — Plan of Care (Signed)
   Problem: Health Behavior/Discharge Planning: Goal: Ability to manage health-related needs will improve Outcome: Progressing   Problem: Health Behavior/Discharge Planning: Goal: Ability to manage health-related needs will improve Outcome: Progressing   Problem: Health Behavior/Discharge Planning: Goal: Ability to manage health-related needs will improve Outcome: Progressing

## 2023-06-20 DIAGNOSIS — E039 Hypothyroidism, unspecified: Secondary | ICD-10-CM | POA: Diagnosis not present

## 2023-06-20 DIAGNOSIS — K861 Other chronic pancreatitis: Secondary | ICD-10-CM | POA: Diagnosis not present

## 2023-06-20 DIAGNOSIS — K859 Acute pancreatitis without necrosis or infection, unspecified: Secondary | ICD-10-CM | POA: Diagnosis not present

## 2023-06-20 DIAGNOSIS — I25812 Atherosclerosis of bypass graft of coronary artery of transplanted heart without angina pectoris: Secondary | ICD-10-CM | POA: Diagnosis not present

## 2023-06-20 LAB — COMPREHENSIVE METABOLIC PANEL WITH GFR
ALT: 64 U/L — ABNORMAL HIGH (ref 0–44)
AST: 23 U/L (ref 15–41)
Albumin: 3.4 g/dL — ABNORMAL LOW (ref 3.5–5.0)
Alkaline Phosphatase: 113 U/L (ref 38–126)
Anion gap: 7 (ref 5–15)
BUN: 6 mg/dL — ABNORMAL LOW (ref 8–23)
CO2: 27 mmol/L (ref 22–32)
Calcium: 8.8 mg/dL — ABNORMAL LOW (ref 8.9–10.3)
Chloride: 105 mmol/L (ref 98–111)
Creatinine, Ser: 0.77 mg/dL (ref 0.61–1.24)
GFR, Estimated: >60 mL/min (ref >60)
Glucose, Bld: 98 mg/dL (ref 70–99)
Potassium: 3.3 mmol/L — ABNORMAL LOW (ref 3.5–5.1)
Sodium: 139 mmol/L (ref 135–145)
Total Bilirubin: 0.8 mg/dL (ref 0.0–1.2)
Total Protein: 6.3 g/dL — ABNORMAL LOW (ref 6.5–8.1)

## 2023-06-20 LAB — MAGNESIUM: Magnesium: 1.7 mg/dL (ref 1.7–2.4)

## 2023-06-20 MED ORDER — KETOROLAC TROMETHAMINE 15 MG/ML IJ SOLN
15.0000 mg | Freq: Once | INTRAMUSCULAR | Status: AC
  Administered 2023-06-20: 15 mg via INTRAVENOUS
  Filled 2023-06-20: qty 1

## 2023-06-20 MED ORDER — POLYETHYLENE GLYCOL 3350 17 G PO PACK
17.0000 g | PACK | Freq: Three times a day (TID) | ORAL | Status: DC
  Administered 2023-06-20 – 2023-06-21 (×3): 17 g via ORAL
  Filled 2023-06-20 (×8): qty 1

## 2023-06-20 MED ORDER — POTASSIUM CHLORIDE CRYS ER 20 MEQ PO TBCR
40.0000 meq | EXTENDED_RELEASE_TABLET | Freq: Once | ORAL | Status: AC
  Administered 2023-06-20: 40 meq via ORAL
  Filled 2023-06-20: qty 2

## 2023-06-20 MED ORDER — MAGNESIUM SULFATE 2 GM/50ML IV SOLN
2.0000 g | Freq: Once | INTRAVENOUS | Status: AC
  Administered 2023-06-20: 2 g via INTRAVENOUS
  Filled 2023-06-20: qty 50

## 2023-06-20 NOTE — Progress Notes (Signed)
 Nurse at bedside,patient alert and oriented times four.Patient c/o abdominal pain this am rated a 8,sharp and constant, Toradol 15 mg's given IV for pain per [facility] order per MD. Plan of care on going.

## 2023-06-20 NOTE — Plan of Care (Signed)

## 2023-06-20 NOTE — Progress Notes (Signed)
 Subjective: Stomach pain doing about the same. He has had a few very small BMs since starting miralax with harder stool balls. He endorses he thinks once he can empty his bowels, abdominal pain will be better. Having a little nausea but no vomiting.   Objective: Vital signs in last 24 hours: Temp:  [98 F (36.7 C)-98.4 F (36.9 C)] 98.4 F (36.9 C) (04/09 0807) Pulse Rate:  [76-80] 80 (04/09 0807) Resp:  [17-19] 18 (04/09 0807) BP: (109-150)/(70-75) 128/73 (04/09 0807) SpO2:  [94 %-98 %] 98 % (04/09 0807) Last BM Date : 06/19/23 General:   Alert and oriented, pleasant Head:  Normocephalic and atraumatic. Eyes:  No icterus, sclera clear. Conjuctiva pink.  Mouth:  Without lesions, mucosa pink and moist.  Heart:  S1, S2 present, no murmurs noted.  Lungs: Clear to auscultation bilaterally, without wheezing, rales, or rhonchi.  Abdomen:  Bowel sounds present, soft, TTP of diffuse upper abdomen and mid to left upper abdomen, non-distended. Ventral hernia present. No HSM noted. No rebound or guarding. No masses appreciated  Msk:  Symmetrical without gross deformities. Normal posture. Neurologic:  Alert and  oriented x4;  grossly normal neurologically. Skin:  Warm and dry, intact without significant lesions.  Psych:  Alert and cooperative. Normal mood and affect.  Intake/Output from previous day: 04/08 0701 - 04/09 0700 In: 4415.9 [P.O.:1000; I.V.:3415.9] Out: -  Intake/Output this shift: Total I/O In: 360 [P.O.:360] Out: -   Lab Results: Recent Labs    06/19/23 0424  WBC 5.0  HGB 13.5  HCT 40.2  PLT 187   BMET Recent Labs    06/18/23 0437 06/19/23 0424 06/20/23 0519  NA 141 141 139  K 3.3* 3.9 3.3*  CL 105 107 105  CO2 26 26 27   GLUCOSE 93 107* 98  BUN 6* 6* 6*  CREATININE 0.71 0.80 0.77  CALCIUM 9.1 8.9 8.8*   LFT Recent Labs    06/18/23 0437 06/19/23 0424 06/20/23 0519  PROT 6.8 6.3* 6.3*  ALBUMIN 3.6 3.3* 3.4*  AST 24 22 23   ALT 87* 69* 64*  ALKPHOS  133* 111 113  BILITOT 0.8 0.9 0.8    Assessment: Justin [name] is a 68 y.o. 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   Acute on chronic abdominal pain/pancreatitis:  CT with mild peripancreatic haziness, lipase mildly elevated at 74 on admission/36 on repeat. transaminases normal on presentation with slight bump in LFTs with AST 50, ALT 114, alk phos 138 on 4/6, now with AST normal, ALT 64, alk phos normal.  T. bili remaining within normal limits.  MRCP showed pancreatic atrophy, pancreatic ductal dilation with fibrosis and tethering of pancreatic neck to adjacent distal stomach.  No pancreatic edema or acute peripancreatic inflammatory changes.  No CBD stones or stricture.  Gallbladder absent.    Patient reports symptoms initially started to improve, but noticed some increased abdominal pain Monday and diet was backed down to full liquids though continued to have abdominal pain. Creon was increased and miralax BID added yesterday as he was not having adequate BMs. He is starting to move his bowels and feels abdominal pain will improve if he can empty out sufficiently. If no sufficient BMs by this afternoon, would increase miralax to TID dosing.   Notably, he is  followed by [facility] with consideration for ERCP if worsening increase in baseline pain. Recommend follow-up as outpatient with [facility] for further management. No need for inpatient ERCP at this time.   Plan: Continue creon 72k with meals, 36k with snacks  Continue miralax BID, can increase to TID if no sufficient BMs by this afternoon  PPI BID Continue carafate QID Clear liquid diet    LOS: 5 days    06/20/2023, 9:30 AM   Vaibhav Fogleman L. Jeanmarie Hubert, MSN, APRN, AGNP-C Adult-Gerontology Nurse  Practitioner [facility] at [facility]

## 2023-06-20 NOTE — Hospital Course (Signed)
 68 y.o. 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  Patient presents with abdominal pain over the last 7 days.  This has been associated with nausea, vomiting.  He has been admitted with acute on chronic pancreatitis and has a history of the same previously and has been seen at [facility] for this.  He remains on aggressive IV fluids as well as clear liquid diet and has been started on PPI as well as Carafate per GI.  MRCP with no significant findings noted.  Continues to remain on liquid diet and  symptomatic.  GI continues to follow.

## 2023-06-20 NOTE — Progress Notes (Signed)
 PROGRESS NOTE   Justin [name]  ZOX:[id number] DOB: 11-02-1955 DOA: 06/15/2023 PCP: [name], MD   Chief Complaint  Patient presents with   Abdominal Pain   Level of care: Med-Surg  Brief Admission History:  68 y.o. 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  Patient presents with abdominal pain over the last 7 days.  This has been associated with nausea, vomiting.  He has been admitted with acute on chronic pancreatitis and has a history of the same previously and has been seen at [facility] for this.  He remains on aggressive IV fluids as well as clear liquid diet and has been started on PPI as well as Carafate per GI.  MRCP with no significant findings noted.  Continues to remain on liquid diet and  symptomatic.  GI continues to follow.    Assessment and Plan:  Acute on chronic pancreatitis - Pt remains very symptomatic - continue supportive measures - appreciate GI recommendations  Chronic constipation  - increase miralax to TID to produce better bowel movement - further recommendations to follow  Hypothyroidism - resumed home levothyroxine  CAD - stable   Chronic pain  - Stable    DVT prophylaxis: enoxaparin  Code Status: Full  Family Communication:  Disposition: anticipating home soon    Consultants:  GI  Procedures:   Antimicrobials:    Subjective: Pt reports he is still having significant abdominal pain, not able to advance diet yet.  Feels if he has a BM he would feel better.  Says he started to have BM after starting miralax.    Objective: Vitals:   06/19/23 1952 06/20/23 0441 06/20/23 0807 06/20/23 1317  BP: (!) 150/72 109/75 128/73 124/77  Pulse: 77 79 80 78  Resp: 19 17 18 17   Temp: 98.1 F (36.7 C) 98 F (36.7 C) 98.4 F (36.9 C) 98.2 F (36.8 C)  TempSrc: Oral Oral Oral   SpO2: 98% 94% 98% 93%  Weight:      Height:         Intake/Output Summary (Last 24 hours) at 06/20/2023 1601 Last data filed at 06/20/2023 1300 Gross per 24 hour  Intake 4435.89 ml  Output --  Net 4435.89 ml   Filed Weights   06/15/23 1154  Weight: 70.8 kg   Examination:  General exam: Appears calm and comfortable  Respiratory system: Clear to auscultation. Respiratory effort normal. Cardiovascular system: normal S1 & S2 heard. No JVD, murmurs, rubs, gallops or clicks. No pedal edema. Gastrointestinal system: Abdomen is nondistended, soft and persistent epigastric tenderness with light palpation. No organomegaly or masses felt. Normal bowel sounds heard. Central nervous system: Alert and oriented. No focal neurological deficits. Extremities: Symmetric 5 x 5 power. Skin: No rashes, lesions or ulcers. Psychiatry: Judgement and insight appear normal. Mood & affect appropriate.   Data Reviewed: I have personally reviewed following labs and imaging studies  CBC: Recent Labs  Lab 06/15/23 1208 06/16/23 0418 06/19/23 0424  WBC 6.1 7.4 5.0  HGB 14.4 13.7 13.5  HCT 43.2 41.7 40.2  MCV 85.7 87.6 86.8  PLT 179 158 187    Basic Metabolic Panel: Recent Labs  Lab 06/16/23 0418 06/17/23 0405 06/18/23 0437 06/19/23 0424 06/20/23 0519  NA 139 140 141 141 139  K 3.7 3.5 3.3* 3.9 3.3*  CL 107 105 105 107 105  CO2 24 28 26 26 27   GLUCOSE 108* 91 93 107*  98  BUN 13 7* 6* 6* 6*  CREATININE 0.65 0.72 0.71 0.80 0.77  CALCIUM 8.5* 8.6* 9.1 8.9 8.8*  MG  --  1.9 1.9 1.8 1.7    CBG: No results for input(s): "GLUCAP" in the last 168 hours.  No results found for this or any previous visit (from the past 240 hours).   Radiology Studies: No results found.  Scheduled Meds:  aspirin  81 mg Oral Daily   clonazePAM  0.5 mg Oral QHS   DULoxetine  60 mg Oral q morning   enoxaparin (LOVENOX) injection  40 mg Subcutaneous Q24H   fenofibrate  160 mg Oral QHS   levothyroxine  112 mcg Oral Q0600   lipase/protease/amylase  36,000 Units  Oral With snacks   lipase/protease/amylase  72,000 Units Oral TID WC   pantoprazole  40 mg Oral BID   polyethylene glycol  17 g Oral TID   sucralfate  1 g Oral TID WC & HS   tamsulosin  0.4 mg Oral BID   Continuous Infusions:  lactated ringers 20 mL/hr at 06/20/23 1144     LOS: 5 days   Time spent: 52 mins  Lautaro Koral Laural Benes, MD How to contact the [facility] Attending or Consulting provider 7A - 7P or covering provider during after hours 7P -7A, for this patient?  Check the care team in [facility] and look for a) attending/consulting [facility] provider listed and b) the [facility] team listed Log into [email or web address] to find provider on call.  Locate the [facility] provider you are looking for under [facility] and page to a number that you can be directly reached. If you still have difficulty reaching the provider, please page the [facility] (Director on Call) for the Hospitalists listed on amion for assistance.  06/20/2023, 4:01 PM

## 2023-06-21 DIAGNOSIS — K859 Acute pancreatitis without necrosis or infection, unspecified: Secondary | ICD-10-CM | POA: Diagnosis not present

## 2023-06-21 DIAGNOSIS — E039 Hypothyroidism, unspecified: Secondary | ICD-10-CM | POA: Diagnosis not present

## 2023-06-21 DIAGNOSIS — K5903 Drug induced constipation: Secondary | ICD-10-CM

## 2023-06-21 DIAGNOSIS — K861 Other chronic pancreatitis: Secondary | ICD-10-CM | POA: Diagnosis not present

## 2023-06-21 DIAGNOSIS — T402X5A Adverse effect of other opioids, initial encounter: Secondary | ICD-10-CM | POA: Diagnosis not present

## 2023-06-21 DIAGNOSIS — I25812 Atherosclerosis of bypass graft of coronary artery of transplanted heart without angina pectoris: Secondary | ICD-10-CM | POA: Diagnosis not present

## 2023-06-21 LAB — BASIC METABOLIC PANEL WITH GFR
Anion gap: 8 (ref 5–15)
BUN: 6 mg/dL — ABNORMAL LOW (ref 8–23)
CO2: 25 mmol/L (ref 22–32)
Calcium: 8.7 mg/dL — ABNORMAL LOW (ref 8.9–10.3)
Chloride: 106 mmol/L (ref 98–111)
Creatinine, Ser: 0.77 mg/dL (ref 0.61–1.24)
GFR, Estimated: >60 mL/min (ref >60)
Glucose, Bld: 104 mg/dL — ABNORMAL HIGH (ref 70–99)
Potassium: 3.6 mmol/L (ref 3.5–5.1)
Sodium: 139 mmol/L (ref 135–145)

## 2023-06-21 LAB — MAGNESIUM: Magnesium: 2.1 mg/dL (ref 1.7–2.4)

## 2023-06-21 MED ORDER — LINACLOTIDE 145 MCG PO CAPS
145.0000 ug | ORAL_CAPSULE | Freq: Every day | ORAL | Status: DC
  Administered 2023-06-21 – 2023-06-23 (×3): 145 ug via ORAL
  Filled 2023-06-21 (×3): qty 1

## 2023-06-21 NOTE — Plan of Care (Signed)
   Problem: Activity: Goal: Risk for activity intolerance will decrease Outcome: Progressing   Problem: Coping: Goal: Level of anxiety will decrease Outcome: Progressing

## 2023-06-21 NOTE — Progress Notes (Signed)
 Gastroenterology Progress Note   Referring Provider: No ref. provider found Primary Care Physician:  [name], MD Primary Gastroenterologist:  [facility] ([place])  Patient ID: Justin [name]; [id number]; [birth year]    Subjective   Still having some pain.  Able to tolerate liquids this morning.  Has only passed small amount of stool, stool small balls.  Pain is more mid abdomen and left side. No nausea.   Objective   Vital signs in last 24 hours Temp:  [97.9 F (36.6 C)-98.8 F (37.1 C)] 97.9 F (36.6 C) (04/10 0450) Pulse Rate:  [67-78] 67 (04/10 0450) Resp:  [17-20] 20 (04/10 0450) BP: (104-132)/(66-77) 104/66 (04/10 0450) SpO2:  [93 %-95 %] 94 % (04/10 0450) Last BM Date : 06/19/23  Physical Exam General:   Alert and oriented, pleasant Head:  Normocephalic and atraumatic. Eyes:  No icterus, sclera clear. Conjuctiva pink.  Mouth:  Without lesions, mucosa pink and moist.   Abdomen:  Bowel sounds present. Ttp to epigastrium,and into LUQ. LLQ ttp as well. No HSM. No rebound or guarding. No masses appreciated. Soft periumbilical hernia present.  Neurologic:  Alert and  oriented x4;  grossly normal neurologically. Psych:  Alert and cooperative. Normal mood and affect.  Intake/Output from previous day: 04/09 0701 - 04/10 0700 In: 2206.1 [P.O.:720; I.V.:1486.1] Out: -  Intake/Output this shift: Total I/O In: 400 [P.O.:400] Out: -   Lab Results  Recent Labs    06/19/23 0424  WBC 5.0  HGB 13.5  HCT 40.2  PLT 187   BMET Recent Labs    06/19/23 0424 06/20/23 0519 06/21/23 0444  NA 141 139 139  K 3.9 3.3* 3.6  CL 107 105 106  CO2 26 27 25   GLUCOSE 107* 98 104*  BUN 6* 6* 6*  CREATININE 0.80 0.77 0.77  CALCIUM 8.9 8.8* 8.7*   LFT Recent Labs    06/19/23 0424 06/20/23 0519  PROT 6.3* 6.3*  ALBUMIN 3.3* 3.4*  AST 22 23  ALT 69* 64*  ALKPHOS 111 113  BILITOT 0.9 0.8   PT/INR No results for input(s): "LABPROT", "INR" in the last 72  hours. Hepatitis Panel No results for input(s): "HEPBSAG", "HCVAB", "HEPAIGM", "HEPBIGM" in the last 72 hours.  Studies/Results MR ABDOMEN MRCP W WO CONTAST Result Date: 06/16/2023 CLINICAL DATA:  Acute on chronic pancreatitis. EXAM: MRI ABDOMEN WITHOUT AND WITH CONTRAST (INCLUDING MRCP) TECHNIQUE: Multiplanar multisequence MR imaging of the abdomen was performed both before and after the administration of intravenous contrast. Heavily T2-weighted images of the biliary and pancreatic ducts were obtained, and three-dimensional MRCP images were rendered by post processing. CONTRAST:  7mL GADAVIST GADOBUTROL 1 MMOL/ML IV SOLN COMPARISON:  CT on 06/15/2023 FINDINGS: Lower chest: No acute findings. Hepatobiliary: No hepatic masses identified. Prior cholecystectomy noted. Mild dilatation of intrahepatic bile ducts is seen, while the common duct measures 7 mm in diameter which is within normal limits. No evidence of choledocholithiasis or biliary stricture. Pancreas: Atrophy and ductal dilatation is seen involving the pancreatic body and tail. Dilated pancreatic duct terminates in the pancreatic neck, where there is evidence fibrosis and tethering of the posterior wall of the distal stomach. No masses seen at this site. No evidence of pancreatic edema or acute peripancreatic inflammatory changes. No abnormal fluid collections are identified. Spleen:  Within normal limits in size and appearance. Adrenals/Urinary Tract: No suspicious masses identified. No evidence of hydronephrosis. Stomach/Bowel: Diverticulosis involving the visualized portion of the descending colon, without signs of diverticulitis in this  region. Vascular/Lymphatic: No pathologically enlarged lymph nodes identified. No acute vascular findings. Other: 2 small left paraumbilical ventral hernias are seen which contain only fat. A small right paraumbilical ventral hernia is seen which contains a small bowel loop. No evidence of bowel obstruction.  Musculoskeletal:  No suspicious bone lesions identified. IMPRESSION: Atrophy and pancreatic ductal dilatation involving the body and tail, with fibrosis and tethering of pancreatic neck to the adjacent distal stomach. This may be due to sequelae of chronic pancreatitis or prior traumatic injury. No radiographic evidence of acute pancreatitis or mass. Prior cholecystectomy. Mild intrahepatic biliary ductal dilatation, without evidence of choledocholithiasis or biliary stricture. Small right paraumbilical ventral hernia which contains a small bowel loop. No evidence of bowel obstruction. Two small left paraumbilical ventral hernias which contain only fat. Colonic diverticulosis, without radiographic evidence of diverticulitis. Electronically Signed   By: Danae Orleans M.D.   On: 06/16/2023 16:30   MR 3D Recon At Scanner Result Date: 06/16/2023 CLINICAL DATA:  Acute on chronic pancreatitis. EXAM: MRI ABDOMEN WITHOUT AND WITH CONTRAST (INCLUDING MRCP) TECHNIQUE: Multiplanar multisequence MR imaging of the abdomen was performed both before and after the administration of intravenous contrast. Heavily T2-weighted images of the biliary and pancreatic ducts were obtained, and three-dimensional MRCP images were rendered by post processing. CONTRAST:  7mL GADAVIST GADOBUTROL 1 MMOL/ML IV SOLN COMPARISON:  CT on 06/15/2023 FINDINGS: Lower chest: No acute findings. Hepatobiliary: No hepatic masses identified. Prior cholecystectomy noted. Mild dilatation of intrahepatic bile ducts is seen, while the common duct measures 7 mm in diameter which is within normal limits. No evidence of choledocholithiasis or biliary stricture. Pancreas: Atrophy and ductal dilatation is seen involving the pancreatic body and tail. Dilated pancreatic duct terminates in the pancreatic neck, where there is evidence fibrosis and tethering of the posterior wall of the distal stomach. No masses seen at this site. No evidence of pancreatic edema or acute  peripancreatic inflammatory changes. No abnormal fluid collections are identified. Spleen:  Within normal limits in size and appearance. Adrenals/Urinary Tract: No suspicious masses identified. No evidence of hydronephrosis. Stomach/Bowel: Diverticulosis involving the visualized portion of the descending colon, without signs of diverticulitis in this region. Vascular/Lymphatic: No pathologically enlarged lymph nodes identified. No acute vascular findings. Other: 2 small left paraumbilical ventral hernias are seen which contain only fat. A small right paraumbilical ventral hernia is seen which contains a small bowel loop. No evidence of bowel obstruction. Musculoskeletal:  No suspicious bone lesions identified. IMPRESSION: Atrophy and pancreatic ductal dilatation involving the body and tail, with fibrosis and tethering of pancreatic neck to the adjacent distal stomach. This may be due to sequelae of chronic pancreatitis or prior traumatic injury. No radiographic evidence of acute pancreatitis or mass. Prior cholecystectomy. Mild intrahepatic biliary ductal dilatation, without evidence of choledocholithiasis or biliary stricture. Small right paraumbilical ventral hernia which contains a small bowel loop. No evidence of bowel obstruction. Two small left paraumbilical ventral hernias which contain only fat. Colonic diverticulosis, without radiographic evidence of diverticulitis. Electronically Signed   By: Danae Orleans M.D.   On: 06/16/2023 16:30   CT ABDOMEN PELVIS W CONTRAST Result Date: 06/15/2023 CLINICAL DATA:  Abdominal pain.  History of pancreatitis. EXAM: CT ABDOMEN AND PELVIS WITH CONTRAST TECHNIQUE: Multidetector CT imaging of the abdomen and pelvis was performed using the standard protocol following bolus administration of intravenous contrast. RADIATION DOSE REDUCTION: This exam was performed according to the departmental dose-optimization program which includes automated exposure control, adjustment of  the mA and/or  kV according to patient size and/or use of iterative reconstruction technique. CONTRAST:  OMNIPAQUE IOHEXOL 300 MG/ML  SOLN COMPARISON:  CT abdomen pelvis dated 10/23/2022. FINDINGS: Lower chest: Bibasilar subpleural atelectasis/scarring. The visualized lung bases are otherwise clear. No intra-abdominal free air or free fluid. Hepatobiliary: The liver is unremarkable. There is biliary dilatation, post cholecystectomy. Pancreas: Atrophy of the body of the pancreas with dilatation of the main pancreatic duct sequela of chronic pancreatitis. Mild peripancreatic haziness, likely chronic. Correlation with pancreatic enzymes recommended to exclude recurrent pancreatitis. No abscess. Spleen: Normal in size without focal abnormality. Adrenals/Urinary Tract: The adrenal glands are unremarkable. Small nonobstructing bilateral renal calculi measure up to 3 mm in the inferior pole of the left kidney. There is no hydronephrosis on either side. There is symmetric enhancement and excretion of contrast by both kidneys. The visualized ureters and urinary bladder appear unremarkable. Stomach/Bowel: There is sigmoid diverticulosis with muscular hypertrophy. No active inflammatory changes. There is no bowel obstruction. The appendix is normal. Vascular/Lymphatic: Mild aortoiliac atherosclerotic disease. The IVC is unremarkable. There is high-grade narrowing of the porta splenic confluence due to adhesions. The splenic vein, and main portal vein are patent. No portal venous gas. There is no adenopathy. Upper abdominal collateral vessels noted. Reproductive: The prostate is grossly unremarkable.  No pelvic mass Other: Similar appearance of midline ventral hernias with a 20 fusion of a short segment of small bowel to the right of the midline. No obstruction. Musculoskeletal: Degenerative changes of the spine. No acute osseous pathology. IMPRESSION: 1. Sequela of chronic pancreatitis. Correlation with pancreatic enzymes  recommended to exclude acute on chronic pancreatitis. No abscess. 2. Sigmoid diverticulosis. No bowel obstruction. Normal appendix. 3. Small nonobstructing bilateral renal calculi. No hydronephrosis. Electronically Signed   By: Elgie Collard M.D.   On: 06/15/2023 15:50    Assessment  68 y.o. 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  GI consulted to assist with management.  Pancreatitis, acute on chronic abdominal pain: - Etiology likely alcohol - course c/b necrosis s/p necrosectomy and cystgastrostomy for pseudocyst in the past - CT this admission with mild peripancreatic haziness - Lipase elevated at 74 admission - LFTs normal on presentation with recent slight bump and then declined again, yesterday ALT 64, AST 23 with normal alk phos and T. bili. - MRCP with pancreatic atrophy, pancreatic duct dilation with fibrosis and weathering of pancreatic neck to adjacent distal stomach.  No peripancreatic edema or acute peripancreatic inflammatory changes noted as well as known CBD stones or stricture. - Has had intermittent improvement of symptoms, was feeling well this morning but pain increased this afternoon. - Currently tolerating liquid diet without any increased pain - Creon has been increased this admission and was started on MiraLAX twice daily initially given inadequate bowel movements. - Continuing to have only [place] 1 stools and very small amounts with ongoing abdominal pain.  Will add Linzess. - Has followed with [facility] in the past for consideration of ERCP if his baseline abdominal pain began to increase.  Should consider this on discharge.  Plan / Recommendations  Creon 36K - 2 capsules with meals and 1 with snacks Miralax Bid Add linzess 145 mcg daily PPI  BID Carafate QID Diet - clears Outpatient follow up with [facility] for consideration of ERCP recommended on discharge  LOS: 6 days   06/21/2023, 12:47 PM  Brooke Bonito, MSN, FNP-BC, AGACNP-BC [facility]

## 2023-06-21 NOTE — Plan of Care (Signed)

## 2023-06-21 NOTE — Progress Notes (Signed)
 PROGRESS NOTE   Justin [name]  ZOX:[id number] DOB: 12-Jul-1955 DOA: 06/15/2023 PCP: [name], MD   Chief Complaint  Patient presents with   Abdominal Pain   Level of care: Med-Surg  Brief Admission History:  68 y.o. 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  Patient presents with abdominal pain over the last 7 days.  This has been associated with nausea, vomiting.  He has been admitted with acute on chronic pancreatitis and has a history of the same previously and has been seen at [facility] for this.  He remains on aggressive IV fluids as well as clear liquid diet and has been started on PPI as well as Carafate per GI.  MRCP with no significant findings noted.  Continues to remain on liquid diet and  symptomatic.  GI continues to follow.    Assessment and Plan:  Acute on chronic pancreatitis - Pt remains very symptomatic - continue supportive measures - appreciate GI recommendations  Chronic constipation  - continue miralax to TID to produce better bowel movement - he is feeling like he could have a better BM soon per patient  Hypothyroidism - resumed home levothyroxine  CAD - stable   Chronic pain  - Stable   DVT prophylaxis: enoxaparin  Code Status: Full  Family Communication:  Disposition: anticipating home when ok with GI service    Consultants:  GI  Procedures:   Antimicrobials:    Subjective: Reports still abd discomfort due to constipation, only small hard balls of stool, has been taking the TID miralax treatments    Objective: Vitals:   06/20/23 0807 06/20/23 1317 06/20/23 1937 06/21/23 0450  BP: 128/73 124/77 132/76 104/66  Pulse: 80 78 72 67  Resp: 18 17 18 20   Temp: 98.4 F (36.9 C) 98.2 F (36.8 C) 98.8 F (37.1 C) 97.9 F (36.6 C)  TempSrc: Oral  Oral Oral  SpO2: 98% 93% 95% 94%  Weight:      Height:        Intake/Output  Summary (Last 24 hours) at 06/21/2023 1109 Last data filed at 06/21/2023 0830 Gross per 24 hour  Intake 2246.06 ml  Output --  Net 2246.06 ml   Filed Weights   06/15/23 1154  Weight: 70.8 kg   Examination:  General exam: Appears calm and comfortable  Respiratory system: Clear to auscultation. Respiratory effort normal. Cardiovascular system: normal S1 & S2 heard. No JVD, murmurs, rubs, gallops or clicks. No pedal edema. Gastrointestinal system: Abdomen is nondistended, soft and persistent epigastric tenderness with light palpation. No organomegaly or masses felt. Normal bowel sounds heard. Central nervous system: Alert and oriented. No focal neurological deficits. Extremities: Symmetric 5 x 5 power. Skin: No rashes, lesions or ulcers. Psychiatry: Judgement and insight appear normal. Mood & affect appropriate.   Data Reviewed: I have personally reviewed following labs and imaging studies  CBC: Recent Labs  Lab 06/15/23 1208 06/16/23 0418 06/19/23 0424  WBC 6.1 7.4 5.0  HGB 14.4 13.7 13.5  HCT 43.2 41.7 40.2  MCV 85.7 87.6 86.8  PLT 179 158 187    Basic Metabolic Panel: Recent Labs  Lab 06/17/23 0405 06/18/23 0437 06/19/23 0424 06/20/23 0519 06/21/23 0444  NA 140 141 141 139 139  K 3.5 3.3* 3.9 3.3* 3.6  CL 105 105 107 105 106  CO2 28 26 26 27 25   GLUCOSE 91 93 107* 98 104*  BUN 7* 6*  6* 6* 6*  CREATININE 0.72 0.71 0.80 0.77 0.77  CALCIUM 8.6* 9.1 8.9 8.8* 8.7*  MG 1.9 1.9 1.8 1.7 2.1    CBG: No results for input(s): "GLUCAP" in the last 168 hours.  No results found for this or any previous visit (from the past 240 hours).   Radiology Studies: No results found.  Scheduled Meds:  aspirin  81 mg Oral Daily   clonazePAM  0.5 mg Oral QHS   DULoxetine  60 mg Oral q morning   enoxaparin (LOVENOX) injection  40 mg Subcutaneous Q24H   fenofibrate  160 mg Oral QHS   levothyroxine  112 mcg Oral Q0600   lipase/protease/amylase  36,000 Units Oral With snacks    lipase/protease/amylase  72,000 Units Oral TID WC   pantoprazole  40 mg Oral BID   polyethylene glycol  17 g Oral TID   sucralfate  1 g Oral TID WC & HS   tamsulosin  0.4 mg Oral BID   Continuous Infusions:  lactated ringers 35 mL/hr at 06/21/23 0733     LOS: 6 days   Time spent: 40 mins  [name], MD How to contact the [facility] Attending or Consulting provider 7A - 7P or covering provider during after hours 7P -7A, for this patient?  Check the care team in [facility] and look for a) attending/consulting [facility] provider listed and b) the [facility] team listed Log into [email or web address] to find provider on call.  Locate the [facility] provider you are looking for under [facility] and page to a number that you can be directly reached. If you still have difficulty reaching the provider, please page the [facility] (Director on Call) for the Hospitalists listed on amion for assistance.  06/21/2023, 11:09 AM

## 2023-06-21 NOTE — Plan of Care (Signed)
  Problem: Health Behavior/Discharge Planning: Goal: Ability to manage health-related needs will improve Outcome: Progressing   Problem: Clinical Measurements: Goal: Ability to maintain clinical measurements within normal limits will improve Outcome: Progressing Goal: Will remain free from infection Outcome: Progressing Goal: Diagnostic test results will improve Outcome: Progressing Goal: Respiratory complications will improve Outcome: Adequate for Discharge Goal: Cardiovascular complication will be avoided Outcome: Adequate for Discharge   Problem: Activity: Goal: Risk for activity intolerance will decrease Outcome: Adequate for Discharge   Problem: Nutrition: Goal: Adequate nutrition will be maintained Outcome: Progressing   Problem: Coping: Goal: Level of anxiety will decrease Outcome: Adequate for Discharge   Problem: Elimination: Goal: Will not experience complications related to bowel motility Outcome: Progressing Goal: Will not experience complications related to urinary retention Outcome: Adequate for Discharge   Problem: Pain Managment: Goal: General experience of comfort will improve and/or be controlled Outcome: Progressing   Problem: Safety: Goal: Ability to remain free from injury will improve Outcome: Adequate for Discharge   Problem: Skin Integrity: Goal: Risk for impaired skin integrity will decrease Outcome: Adequate for Discharge

## 2023-06-22 DIAGNOSIS — K861 Other chronic pancreatitis: Secondary | ICD-10-CM | POA: Diagnosis not present

## 2023-06-22 DIAGNOSIS — K859 Acute pancreatitis without necrosis or infection, unspecified: Secondary | ICD-10-CM | POA: Diagnosis not present

## 2023-06-22 DIAGNOSIS — E039 Hypothyroidism, unspecified: Secondary | ICD-10-CM | POA: Diagnosis not present

## 2023-06-22 DIAGNOSIS — I25812 Atherosclerosis of bypass graft of coronary artery of transplanted heart without angina pectoris: Secondary | ICD-10-CM | POA: Diagnosis not present

## 2023-06-22 LAB — LIPASE, BLOOD: Lipase: 63 U/L — ABNORMAL HIGH (ref 11–51)

## 2023-06-22 MED ORDER — FENTANYL CITRATE PF 50 MCG/ML IJ SOSY
25.0000 ug | PREFILLED_SYRINGE | INTRAMUSCULAR | Status: DC | PRN
  Administered 2023-06-22 – 2023-06-23 (×2): 25 ug via INTRAVENOUS
  Filled 2023-06-22 (×2): qty 1

## 2023-06-22 NOTE — Care Management Important Message (Signed)
 Important Message  Patient Details  Name: Justin [name] MRN: [id number] Date of Birth: [birth year]   Important Message Given:  Yes - Medicare IM     Corey Harold 06/22/2023, 3:05 PM

## 2023-06-22 NOTE — Progress Notes (Signed)
 Gastroenterology Progress Note   Referring Provider: No ref. provider found Primary Care Physician:  [name], MD Primary Gastroenterologist:  Kenn[facility] ([place])  Patient ID: Justin [name]; [id number]; 1956-03-06    Subjective   Pain improving with medication. Having Bms now. Tolerating clear liwuid diet, pain not worsening with meals.   Objective   Vital signs in last 24 hours Temp:  [98 F (36.7 C)-99.6 F (37.6 C)] 98.7 F (37.1 C) (04/11 0352) Pulse Rate:  [65-76] 68 (04/11 0352) Resp:  [16-20] 16 (04/11 0352) BP: (104-139)/(63-80) 104/63 (04/11 0352) SpO2:  [93 %-96 %] 95 % (04/11 0352) Last BM Date : 06/22/23  Physical Exam General:   Alert and oriented, pleasant Head:  Normocephalic and atraumatic. Eyes:  No icterus, sclera clear. Conjuctiva pink.  Abdomen:  soft, non-distended. Ttp epigastrium. No HSM or hernias noted. No rebound or guarding. No masses appreciated Neurologic:  Alert and  oriented x4;  grossly normal neurologically. Psych:  Alert and cooperative. Normal mood and affect.  Intake/Output from previous day: 04/10 0701 - 04/11 0700 In: 1402.1 [P.O.:760; I.V.:642.1] Out: -  Intake/Output this shift: Total I/O In: 240 [P.O.:240] Out: -   Lab Results  No results for input(s): "WBC", "HGB", "HCT", "PLT" in the last 72 hours. BMET Recent Labs    06/20/23 0519 06/21/23 0444  NA 139 139  K 3.3* 3.6  CL 105 106  CO2 27 25  GLUCOSE 98 104*  BUN 6* 6*  CREATININE 0.77 0.77  CALCIUM 8.8* 8.7*   LFT Recent Labs    06/20/23 0519  PROT 6.3*  ALBUMIN 3.4*  AST 23  ALT 64*  ALKPHOS 113  BILITOT 0.8   PT/INR No results for input(s): "LABPROT", "INR" in the last 72 hours. Hepatitis Panel No results for input(s): "HEPBSAG", "HCVAB", "HEPAIGM", "HEPBIGM" in the last 72 hours.  Studies/Results MR ABDOMEN MRCP W WO CONTAST Result Date: 06/16/2023 CLINICAL DATA:  Acute on chronic pancreatitis. EXAM: MRI ABDOMEN WITHOUT AND WITH  CONTRAST (INCLUDING MRCP) TECHNIQUE: Multiplanar multisequence MR imaging of the abdomen was performed both before and after the administration of intravenous contrast. Heavily T2-weighted images of the biliary and pancreatic ducts were obtained, and three-dimensional MRCP images were rendered by post processing. CONTRAST:  7mL GADAVIST GADOBUTROL 1 MMOL/ML IV SOLN COMPARISON:  CT on 06/15/2023 FINDINGS: Lower chest: No acute findings. Hepatobiliary: No hepatic masses identified. Prior cholecystectomy noted. Mild dilatation of intrahepatic bile ducts is seen, while the common duct measures 7 mm in diameter which is within normal limits. No evidence of choledocholithiasis or biliary stricture. Pancreas: Atrophy and ductal dilatation is seen involving the pancreatic body and tail. Dilated pancreatic duct terminates in the pancreatic neck, where there is evidence fibrosis and tethering of the posterior wall of the distal stomach. No masses seen at this site. No evidence of pancreatic edema or acute peripancreatic inflammatory changes. No abnormal fluid collections are identified. Spleen:  Within normal limits in size and appearance. Adrenals/Urinary Tract: No suspicious masses identified. No evidence of hydronephrosis. Stomach/Bowel: Diverticulosis involving the visualized portion of the descending colon, without signs of diverticulitis in this region. Vascular/Lymphatic: No pathologically enlarged lymph nodes identified. No acute vascular findings. Other: 2 small left paraumbilical ventral hernias are seen which contain only fat. A small right paraumbilical ventral hernia is seen which contains a small bowel loop. No evidence of bowel obstruction. Musculoskeletal:  No suspicious bone lesions identified. IMPRESSION: Atrophy and pancreatic ductal dilatation involving the body and tail, with  fibrosis and tethering of pancreatic neck to the adjacent distal stomach. This may be due to sequelae of chronic pancreatitis or  prior traumatic injury. No radiographic evidence of acute pancreatitis or mass. Prior cholecystectomy. Mild intrahepatic biliary ductal dilatation, without evidence of choledocholithiasis or biliary stricture. Small right paraumbilical ventral hernia which contains a small bowel loop. No evidence of bowel obstruction. Two small left paraumbilical ventral hernias which contain only fat. Colonic diverticulosis, without radiographic evidence of diverticulitis. Electronically Signed   By: Danae Orleans M.D.   On: 06/16/2023 16:30   MR 3D Recon At Scanner Result Date: 06/16/2023 CLINICAL DATA:  Acute on chronic pancreatitis. EXAM: MRI ABDOMEN WITHOUT AND WITH CONTRAST (INCLUDING MRCP) TECHNIQUE: Multiplanar multisequence MR imaging of the abdomen was performed both before and after the administration of intravenous contrast. Heavily T2-weighted images of the biliary and pancreatic ducts were obtained, and three-dimensional MRCP images were rendered by post processing. CONTRAST:  7mL GADAVIST GADOBUTROL 1 MMOL/ML IV SOLN COMPARISON:  CT on 06/15/2023 FINDINGS: Lower chest: No acute findings. Hepatobiliary: No hepatic masses identified. Prior cholecystectomy noted. Mild dilatation of intrahepatic bile ducts is seen, while the common duct measures 7 mm in diameter which is within normal limits. No evidence of choledocholithiasis or biliary stricture. Pancreas: Atrophy and ductal dilatation is seen involving the pancreatic body and tail. Dilated pancreatic duct terminates in the pancreatic neck, where there is evidence fibrosis and tethering of the posterior wall of the distal stomach. No masses seen at this site. No evidence of pancreatic edema or acute peripancreatic inflammatory changes. No abnormal fluid collections are identified. Spleen:  Within normal limits in size and appearance. Adrenals/Urinary Tract: No suspicious masses identified. No evidence of hydronephrosis. Stomach/Bowel: Diverticulosis involving the  visualized portion of the descending colon, without signs of diverticulitis in this region. Vascular/Lymphatic: No pathologically enlarged lymph nodes identified. No acute vascular findings. Other: 2 small left paraumbilical ventral hernias are seen which contain only fat. A small right paraumbilical ventral hernia is seen which contains a small bowel loop. No evidence of bowel obstruction. Musculoskeletal:  No suspicious bone lesions identified. IMPRESSION: Atrophy and pancreatic ductal dilatation involving the body and tail, with fibrosis and tethering of pancreatic neck to the adjacent distal stomach. This may be due to sequelae of chronic pancreatitis or prior traumatic injury. No radiographic evidence of acute pancreatitis or mass. Prior cholecystectomy. Mild intrahepatic biliary ductal dilatation, without evidence of choledocholithiasis or biliary stricture. Small right paraumbilical ventral hernia which contains a small bowel loop. No evidence of bowel obstruction. Two small left paraumbilical ventral hernias which contain only fat. Colonic diverticulosis, without radiographic evidence of diverticulitis. Electronically Signed   By: Danae Orleans M.D.   On: 06/16/2023 16:30   CT ABDOMEN PELVIS W CONTRAST Result Date: 06/15/2023 CLINICAL DATA:  Abdominal pain.  History of pancreatitis. EXAM: CT ABDOMEN AND PELVIS WITH CONTRAST TECHNIQUE: Multidetector CT imaging of the abdomen and pelvis was performed using the standard protocol following bolus administration of intravenous contrast. RADIATION DOSE REDUCTION: This exam was performed according to the departmental dose-optimization program which includes automated exposure control, adjustment of the mA and/or kV according to patient size and/or use of iterative reconstruction technique. CONTRAST:  OMNIPAQUE IOHEXOL 300 MG/ML  SOLN COMPARISON:  CT abdomen pelvis dated 10/23/2022. FINDINGS: Lower chest: Bibasilar subpleural atelectasis/scarring. The  visualized lung bases are otherwise clear. No intra-abdominal free air or free fluid. Hepatobiliary: The liver is unremarkable. There is biliary dilatation, post cholecystectomy. Pancreas: Atrophy of the body  of the pancreas with dilatation of the main pancreatic duct sequela of chronic pancreatitis. Mild peripancreatic haziness, likely chronic. Correlation with pancreatic enzymes recommended to exclude recurrent pancreatitis. No abscess. Spleen: Normal in size without focal abnormality. Adrenals/Urinary Tract: The adrenal glands are unremarkable. Small nonobstructing bilateral renal calculi measure up to 3 mm in the inferior pole of the left kidney. There is no hydronephrosis on either side. There is symmetric enhancement and excretion of contrast by both kidneys. The visualized ureters and urinary bladder appear unremarkable. Stomach/Bowel: There is sigmoid diverticulosis with muscular hypertrophy. No active inflammatory changes. There is no bowel obstruction. The appendix is normal. Vascular/Lymphatic: Mild aortoiliac atherosclerotic disease. The IVC is unremarkable. There is high-grade narrowing of the porta splenic confluence due to adhesions. The splenic vein, and main portal vein are patent. No portal venous gas. There is no adenopathy. Upper abdominal collateral vessels noted. Reproductive: The prostate is grossly unremarkable.  No pelvic mass Other: Similar appearance of midline ventral hernias with a 20 fusion of a short segment of small bowel to the right of the midline. No obstruction. Musculoskeletal: Degenerative changes of the spine. No acute osseous pathology. IMPRESSION: 1. Sequela of chronic pancreatitis. Correlation with pancreatic enzymes recommended to exclude acute on chronic pancreatitis. No abscess. 2. Sigmoid diverticulosis. No bowel obstruction. Normal appendix. 3. Small nonobstructing bilateral renal calculi. No hydronephrosis. Electronically Signed   By: Elgie Collard M.D.   On:  06/15/2023 15:50    Assessment  68 y.o. 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  Also with constipation.  GI consulted to assist with management.  Pancreatitis, acute on chronic abdominal pain: -Likely secondary to prior alcohol use -Course complicated by necrosectomy and cyst gastrostomy in the past -CT this admission with mild peripancreatic haziness -Lipase elevated on admission 74 -LFTs normal on presentation with slight bump and then continue to downtrend. -MRCP with pancreatic atrophy, pancreatic ductal dilation with fibrosis and weathering of pancreatic neck to adjacent distal stomach.  No peripancreatic edema or acute peripancreatic inflammatory changes noted as well as known CBD stones or stricture -Maintained on Creon. -constipation is a factor in his abdominal pain. Linzess started yesterday evening. Having BM now.  - Has followed with [facility] in the past for consideration of ERCP if baseline abdominal pain increased or recurrence of pancreatitis occurred.  He should follow-up with him on discharge  Plan / Recommendations  Miralax BID Linzess 145 mcg PPI BID Carafate QID Diet - advance to soft.  Creon once taking solids - 36K capsules - 2 capsules with meals and 1 with snacks  Follow up with [facility] outpatient to discuss ERCP Consider gabapentin for abdominal pain     LOS: 7 days   06/22/2023, 11:53 AM  Brooke Bonito, MSN, FNP-BC, AGACNP-BC [facility]

## 2023-06-22 NOTE — Plan of Care (Signed)

## 2023-06-22 NOTE — Progress Notes (Signed)
 PROGRESS NOTE   Justin [name]  UJW:[id number] DOB: 03/30/1955 DOA: 06/15/2023 PCP: [name], MD   Chief Complaint  Patient presents with   Abdominal Pain   Level of care: Med-Surg  Brief Admission History:  68 y.o. 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  Patient presents with abdominal pain over the last 7 days.  This has been associated with nausea, vomiting.  He has been admitted with acute on chronic pancreatitis and has a history of the same previously and has been seen at [facility] for this.  He remains on aggressive IV fluids as well as clear liquid diet and has been started on PPI as well as Carafate per GI.  MRCP with no significant findings noted.  Continues to remain on liquid diet and  symptomatic.  GI continues to follow.    Assessment and Plan:  Acute on chronic pancreatitis - Pt remains very symptomatic - continue supportive measures - appreciate GI recommendations  Chronic constipation  - continue miralax to TID to produce better bowel movement - he is feeling like he could have a better BM soon per patient  Hypothyroidism - resumed home levothyroxine  CAD - stable   Chronic pain  - Stable   DVT prophylaxis: enoxaparin  Code Status: Full  Family Communication:  Disposition: anticipating home when ok with GI service    Consultants:  GI  Procedures:   Antimicrobials:    Subjective: Pt says he is having severe abdominal pain epigastric today, feels like pancreatitis.    Objective: Vitals:   06/21/23 1351 06/21/23 1953 06/22/23 0352 06/22/23 1326  BP: 125/77 139/80 104/63 119/68  Pulse: 65 76 68 83  Resp: 19 20 16    Temp: 98 F (36.7 C) 99.6 F (37.6 C) 98.7 F (37.1 C) 99 F (37.2 C)  TempSrc:  Oral Oral Oral  SpO2: 96% 93% 95% 94%  Weight:      Height:        Intake/Output Summary (Last 24 hours) at 06/22/2023 1440 Last  data filed at 06/22/2023 1300 Gross per 24 hour  Intake 1122.08 ml  Output --  Net 1122.08 ml   Filed Weights   06/15/23 1154  Weight: 70.8 kg   Examination:  General exam: Appears calm and comfortable  Respiratory system: Clear to auscultation. Respiratory effort normal. Cardiovascular system: normal S1 & S2 heard. No JVD, murmurs, rubs, gallops or clicks. No pedal edema. Gastrointestinal system: Abdomen is nondistended, soft and persistent epigastric tenderness with light palpation. No organomegaly or masses felt. Normal bowel sounds heard. Central nervous system: Alert and oriented. No focal neurological deficits. Extremities: Symmetric 5 x 5 power. Skin: No rashes, lesions or ulcers. Psychiatry: Judgement and insight appear normal. Mood & affect appropriate.   Data Reviewed: I have personally reviewed following labs and imaging studies  CBC: Recent Labs  Lab 06/16/23 0418 06/19/23 0424  WBC 7.4 5.0  HGB 13.7 13.5  HCT 41.7 40.2  MCV 87.6 86.8  PLT 158 187    Basic Metabolic Panel: Recent Labs  Lab 06/17/23 0405 06/18/23 0437 06/19/23 0424 06/20/23 0519 06/21/23 0444  NA 140 141 141 139 139  K 3.5 3.3* 3.9 3.3* 3.6  CL 105 105 107 105 106  CO2 28 26 26 27 25   GLUCOSE 91 93 107* 98 104*  BUN 7* 6* 6* 6* 6*  CREATININE 0.72 0.71 0.80 0.77 0.77  CALCIUM 8.6* 9.1  8.9 8.8* 8.7*  MG 1.9 1.9 1.8 1.7 2.1    CBG: No results for input(s): "GLUCAP" in the last 168 hours.  No results found for this or any previous visit (from the past 240 hours).   Radiology Studies: No results found.  Scheduled Meds:  aspirin  81 mg Oral Daily   clonazePAM  0.5 mg Oral QHS   DULoxetine  60 mg Oral q morning   enoxaparin (LOVENOX) injection  40 mg Subcutaneous Q24H   fenofibrate  160 mg Oral QHS   levothyroxine  112 mcg Oral Q0600   linaclotide  145 mcg Oral QAC breakfast   lipase/protease/amylase  36,000 Units Oral With snacks   lipase/protease/amylase  72,000 Units Oral  TID WC   pantoprazole  40 mg Oral BID   polyethylene glycol  17 g Oral TID   sucralfate  1 g Oral TID WC & HS   tamsulosin  0.4 mg Oral BID   Continuous Infusions:  lactated ringers 35 mL/hr at 06/21/23 1950     LOS: 7 days   Time spent: 45 mins  Roby Donaway Laural Benes, MD How to contact the [facility] Attending or Consulting provider 7A - 7P or covering provider during after hours 7P -7A, for this patient?  Check the care team in [facility] and look for a) attending/consulting [facility] provider listed and b) the [facility] team listed Log into [email or web address] to find provider on call.  Locate the [facility] provider you are looking for under [facility] and page to a number that you can be directly reached. If you still have difficulty reaching the provider, please page the [facility] (Director on Call) for the Hospitalists listed on amion for assistance.  06/22/2023, 2:40 PM

## 2023-06-23 DIAGNOSIS — K5903 Drug induced constipation: Secondary | ICD-10-CM

## 2023-06-23 DIAGNOSIS — T402X5A Adverse effect of other opioids, initial encounter: Secondary | ICD-10-CM

## 2023-06-23 DIAGNOSIS — K859 Acute pancreatitis without necrosis or infection, unspecified: Secondary | ICD-10-CM | POA: Diagnosis not present

## 2023-06-23 DIAGNOSIS — E039 Hypothyroidism, unspecified: Secondary | ICD-10-CM | POA: Diagnosis not present

## 2023-06-23 DIAGNOSIS — I25812 Atherosclerosis of bypass graft of coronary artery of transplanted heart without angina pectoris: Secondary | ICD-10-CM | POA: Diagnosis not present

## 2023-06-23 MED ORDER — SUCRALFATE 1 G PO TABS: 1.0000 g | ORAL_TABLET | Freq: Three times a day (TID) | ORAL | 1 refills | Status: AC

## 2023-06-23 NOTE — Progress Notes (Signed)
 Physician Discharge Summary  Justin [name] MVH:[id number] DOB: 04-Jan-1956 DOA: 06/15/2023  PCP: Orlena Bitters, MD  Admit date: 06/15/2023 Discharge date: 06/23/2023  Admitted From:  Home  Disposition: Home   Recommendations for Outpatient Follow-up:  Follow up with PCP in 1 weeks Follow up with [facility] in 2-3 weeks   Home Health: NA  Discharge Condition: STABLE   CODE STATUS: FULL DIET: soft foods, advance as tolerated, use sucralfate with meals  Brief Hospitalization Summary: Please see all hospital notes, images, labs for full details of the hospitalization. Brief Admission History:  68 y.o. 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  Patient presents with abdominal pain over the last 7 days.  This has been associated with nausea, vomiting.  He has been admitted with acute on chronic pancreatitis and has a history of the same previously and has been seen at [facility] for this.  He remains on aggressive IV fluids as well as clear liquid diet and has been started on PPI as well as Carafate per GI.  MRCP with no significant findings noted.  Continues to remain on liquid diet and  symptomatic.  GI continues to follow.    Assessment and Plan:   Acute on chronic pancreatitis - Improved  - Pt doing much better now and tolerating soft diet and having bowel movements  - he was treated with supportive measures - appreciate GI recommendations - he reports minimal pain now which is a big improvement - DC home today. Pt asking to go home by lunch time today   Chronic constipation  - he is having BMs after treatment with miralax   Hypothyroidism - resumed home levothyroxine   CAD - stable    Chronic pain  - Stable, resume home treatments    Discharge Diagnoses:  Principal Problem:   Acute on chronic pancreatitis (HCC) Active Problems:   CAD (coronary artery disease)  of artery bypass graft   Hypothyroidism   Elevated triglycerides with high cholesterol   Opioid-induced constipation   Discharge Instructions:  Allergies as of 06/23/2023       Reactions   Amoxicillin Hives, Swelling   Dilaudid [hydromorphone Hcl] Swelling   Hydromorphone Swelling   Methotrexate Other (See Comments)   Pt's spouse stated this drug made the pt have pancreatitis   Morphine Hives   Hives, sweating, twitching        Medication List     STOP taking these medications    naproxen 500 MG tablet Commonly known as: NAPROSYN       TAKE these medications    albuterol 108 (90 Base) MCG/ACT inhaler Commonly known as: VENTOLIN HFA Inhale 1-2 puffs into the lungs every 6 (six) hours as needed for wheezing or shortness of breath.   aspirin 81 MG chewable tablet Chew 81 mg by mouth daily.   clonazePAM 0.5 MG tablet Commonly known as: KLONOPIN Take 0.5 mg by mouth at bedtime. *May take one tablet three times daily as needed for anxiety*   DULoxetine 60 MG capsule Commonly known as: CYMBALTA Take 60 mg by mouth every morning.   gabapentin 600 MG tablet Commonly known as: NEURONTIN Take 600 mg by mouth 3 (three) times daily.   hydrocodone-ibuprofen 5-200 MG tablet Commonly known as: VICOPROFEN Take 1 tablet by mouth every 8 (eight) hours as needed for pain.   levothyroxine 112 MCG tablet Commonly known as: SYNTHROID Take 112 mcg by  mouth every morning.   MULTIVITAMIN ADULT PO Take 1 tablet by mouth daily.   nystatin ointment Commonly known as: MYCOSTATIN Apply 1 Application topically 2 (two) times daily.   nystatin powder Commonly known as: MYCOSTATIN/NYSTOP Apply 1 Application topically 2 (two) times daily.   omeprazole 40 MG capsule Commonly known as: PRILOSEC Take 40 mg by mouth daily.   ondansetron 8 MG disintegrating tablet Commonly known as: Zofran ODT Take 1 tablet (8 mg total) by mouth every 8 (eight) hours as needed for nausea or  vomiting.   oxybutynin 10 MG 24 hr tablet Commonly known as: DITROPAN-XL Take 10 mg by mouth daily.   oxyCODONE-acetaminophen 5-325 MG tablet Commonly known as: PERCOCET/ROXICET Take 1 tablet by mouth every 6 (six) hours as needed for severe pain.   Pancrelipase (Lip-Prot-Amyl) 6000-19000 units Cpep Take 2-4 capsules by mouth 5 (five) times daily. Patient takes 4 capsules by mouth three times a day with meals and 2 capsules by mouth twice a day with snacks   rosuvastatin 5 MG tablet Commonly known as: CRESTOR Take 5 mg by mouth daily.   sennosides-docusate sodium 8.6-50 MG tablet Commonly known as: SENOKOT-S Take 1 tablet by mouth daily.   sucralfate 1 g tablet Commonly known as: Carafate Take 1 tablet (1 g total) by mouth 4 (four) times daily -  with meals and at bedtime.   tamsulosin 0.4 MG Caps capsule Commonly known as: FLOMAX Take 0.4 mg by mouth 2 (two) times daily.   tiZANidine 4 MG tablet Commonly known as: ZANAFLEX Take 1 tablet by mouth 3 (three) times daily.   VITAMIN B COMPLEX PO Take 1 tablet by mouth daily.   Vitamin D3 25 MCG (1000 UT) Caps Take 1,000 Units by mouth.        Follow-up Information     Vyas, Dhruv B, MD. Schedule an appointment as soon as possible for a visit in 1 week(s).   Specialty: Internal Medicine Why: Hospital Follow Up Contact information: [address] [place] [address] [phone]         [facility]. Schedule an appointment as soon as possible for a visit in 2 week(s).   Why: Hospital Follow Up Contact information: [address] [place]  [address] [phone]               Allergies  Allergen Reactions   Amoxicillin Hives and Swelling   Dilaudid [Hydromorphone Hcl] Swelling   Hydromorphone Swelling   Methotrexate Other (See Comments)    Pt's spouse stated this drug made the pt have pancreatitis   Morphine Hives    Hives, sweating, twitching   Allergies  as of 06/23/2023       Reactions   Amoxicillin Hives, Swelling   Dilaudid [hydromorphone Hcl] Swelling   Hydromorphone Swelling   Methotrexate Other (See Comments)   Pt's spouse stated this drug made the pt have pancreatitis   Morphine Hives   Hives, sweating, twitching        Medication List     STOP taking these medications    naproxen 500 MG tablet Commonly known as: NAPROSYN       TAKE these medications    albuterol 108 (90 Base) MCG/ACT inhaler Commonly known as: VENTOLIN HFA Inhale 1-2 puffs into the lungs every 6 (six) hours as needed for wheezing or shortness of breath.   aspirin 81 MG chewable tablet Chew 81 mg by mouth daily.   clonazePAM 0.5 MG tablet Commonly known as: KLONOPIN  Take 0.5 mg by mouth at bedtime. *May take one tablet three times daily as needed for anxiety*   DULoxetine 60 MG capsule Commonly known as: CYMBALTA Take 60 mg by mouth every morning.   gabapentin 600 MG tablet Commonly known as: NEURONTIN Take 600 mg by mouth 3 (three) times daily.   hydrocodone-ibuprofen 5-200 MG tablet Commonly known as: VICOPROFEN Take 1 tablet by mouth every 8 (eight) hours as needed for pain.   levothyroxine 112 MCG tablet Commonly known as: SYNTHROID Take 112 mcg by mouth every morning.   MULTIVITAMIN ADULT PO Take 1 tablet by mouth daily.   nystatin ointment Commonly known as: MYCOSTATIN Apply 1 Application topically 2 (two) times daily.   nystatin powder Commonly known as: MYCOSTATIN/NYSTOP Apply 1 Application topically 2 (two) times daily.   omeprazole 40 MG capsule Commonly known as: PRILOSEC Take 40 mg by mouth daily.   ondansetron 8 MG disintegrating tablet Commonly known as: Zofran ODT Take 1 tablet (8 mg total) by mouth every 8 (eight) hours as needed for nausea or vomiting.   oxybutynin 10 MG 24 hr tablet Commonly known as: DITROPAN-XL Take 10 mg by mouth daily.   oxyCODONE-acetaminophen 5-325 MG tablet Commonly known as:  PERCOCET/ROXICET Take 1 tablet by mouth every 6 (six) hours as needed for severe pain.   Pancrelipase (Lip-Prot-Amyl) 6000-19000 units Cpep Take 2-4 capsules by mouth 5 (five) times daily. Patient takes 4 capsules by mouth three times a day with meals and 2 capsules by mouth twice a day with snacks   rosuvastatin 5 MG tablet Commonly known as: CRESTOR Take 5 mg by mouth daily.   sennosides-docusate sodium 8.6-50 MG tablet Commonly known as: SENOKOT-S Take 1 tablet by mouth daily.   sucralfate 1 g tablet Commonly known as: Carafate Take 1 tablet (1 g total) by mouth 4 (four) times daily -  with meals and at bedtime.   tamsulosin 0.4 MG Caps capsule Commonly known as: FLOMAX Take 0.4 mg by mouth 2 (two) times daily.   tiZANidine 4 MG tablet Commonly known as: ZANAFLEX Take 1 tablet by mouth 3 (three) times daily.   VITAMIN B COMPLEX PO Take 1 tablet by mouth daily.   Vitamin D3 25 MCG (1000 UT) Caps Take 1,000 Units by mouth.        Procedures/Studies: MR ABDOMEN MRCP W WO CONTAST Result Date: 06/16/2023 CLINICAL DATA:  Acute on chronic pancreatitis. EXAM: MRI ABDOMEN WITHOUT AND WITH CONTRAST (INCLUDING MRCP) TECHNIQUE: Multiplanar multisequence MR imaging of the abdomen was performed both before and after the administration of intravenous contrast. Heavily T2-weighted images of the biliary and pancreatic ducts were obtained, and three-dimensional MRCP images were rendered by post processing. CONTRAST:  7mL GADAVIST GADOBUTROL 1 MMOL/ML IV SOLN COMPARISON:  CT on 06/15/2023 FINDINGS: Lower chest: No acute findings. Hepatobiliary: No hepatic masses identified. Prior cholecystectomy noted. Mild dilatation of intrahepatic bile ducts is seen, while the common duct measures 7 mm in diameter which is within normal limits. No evidence of choledocholithiasis or biliary stricture. Pancreas: Atrophy and ductal dilatation is seen involving the pancreatic body and tail. Dilated pancreatic  duct terminates in the pancreatic neck, where there is evidence fibrosis and tethering of the posterior wall of the distal stomach. No masses seen at this site. No evidence of pancreatic edema or acute peripancreatic inflammatory changes. No abnormal fluid collections are identified. Spleen:  Within normal limits in size and appearance. Adrenals/Urinary Tract: No suspicious masses identified. No evidence of hydronephrosis. Stomach/Bowel: Diverticulosis  involving the visualized portion of the descending colon, without signs of diverticulitis in this region. Vascular/Lymphatic: No pathologically enlarged lymph nodes identified. No acute vascular findings. Other: 2 small left paraumbilical ventral hernias are seen which contain only fat. A small right paraumbilical ventral hernia is seen which contains a small bowel loop. No evidence of bowel obstruction. Musculoskeletal:  No suspicious bone lesions identified. IMPRESSION: Atrophy and pancreatic ductal dilatation involving the body and tail, with fibrosis and tethering of pancreatic neck to the adjacent distal stomach. This may be due to sequelae of chronic pancreatitis or prior traumatic injury. No radiographic evidence of acute pancreatitis or mass. Prior cholecystectomy. Mild intrahepatic biliary ductal dilatation, without evidence of choledocholithiasis or biliary stricture. Small right paraumbilical ventral hernia which contains a small bowel loop. No evidence of bowel obstruction. Two small left paraumbilical ventral hernias which contain only fat. Colonic diverticulosis, without radiographic evidence of diverticulitis. Electronically Signed   By: Marlyce Sine M.D.   On: 06/16/2023 16:30   MR 3D Recon At Scanner Result Date: 06/16/2023 CLINICAL DATA:  Acute on chronic pancreatitis. EXAM: MRI ABDOMEN WITHOUT AND WITH CONTRAST (INCLUDING MRCP) TECHNIQUE: Multiplanar multisequence MR imaging of the abdomen was performed both before and after the administration of  intravenous contrast. Heavily T2-weighted images of the biliary and pancreatic ducts were obtained, and three-dimensional MRCP images were rendered by post processing. CONTRAST:  7mL GADAVIST GADOBUTROL 1 MMOL/ML IV SOLN COMPARISON:  CT on 06/15/2023 FINDINGS: Lower chest: No acute findings. Hepatobiliary: No hepatic masses identified. Prior cholecystectomy noted. Mild dilatation of intrahepatic bile ducts is seen, while the common duct measures 7 mm in diameter which is within normal limits. No evidence of choledocholithiasis or biliary stricture. Pancreas: Atrophy and ductal dilatation is seen involving the pancreatic body and tail. Dilated pancreatic duct terminates in the pancreatic neck, where there is evidence fibrosis and tethering of the posterior wall of the distal stomach. No masses seen at this site. No evidence of pancreatic edema or acute peripancreatic inflammatory changes. No abnormal fluid collections are identified. Spleen:  Within normal limits in size and appearance. Adrenals/Urinary Tract: No suspicious masses identified. No evidence of hydronephrosis. Stomach/Bowel: Diverticulosis involving the visualized portion of the descending colon, without signs of diverticulitis in this region. Vascular/Lymphatic: No pathologically enlarged lymph nodes identified. No acute vascular findings. Other: 2 small left paraumbilical ventral hernias are seen which contain only fat. A small right paraumbilical ventral hernia is seen which contains a small bowel loop. No evidence of bowel obstruction. Musculoskeletal:  No suspicious bone lesions identified. IMPRESSION: Atrophy and pancreatic ductal dilatation involving the body and tail, with fibrosis and tethering of pancreatic neck to the adjacent distal stomach. This may be due to sequelae of chronic pancreatitis or prior traumatic injury. No radiographic evidence of acute pancreatitis or mass. Prior cholecystectomy. Mild intrahepatic biliary ductal dilatation,  without evidence of choledocholithiasis or biliary stricture. Small right paraumbilical ventral hernia which contains a small bowel loop. No evidence of bowel obstruction. Two small left paraumbilical ventral hernias which contain only fat. Colonic diverticulosis, without radiographic evidence of diverticulitis. Electronically Signed   By: Marlyce Sine M.D.   On: 06/16/2023 16:30   CT ABDOMEN PELVIS W CONTRAST Result Date: 06/15/2023 CLINICAL DATA:  Abdominal pain.  History of pancreatitis. EXAM: CT ABDOMEN AND PELVIS WITH CONTRAST TECHNIQUE: Multidetector CT imaging of the abdomen and pelvis was performed using the standard protocol following bolus administration of intravenous contrast. RADIATION DOSE REDUCTION: This exam was performed according to the  departmental dose-optimization program which includes automated exposure control, adjustment of the mA and/or kV according to patient size and/or use of iterative reconstruction technique. CONTRAST:  100mL OMNIPAQUE IOHEXOL 300 MG/ML  SOLN COMPARISON:  CT abdomen pelvis dated 10/23/2022. FINDINGS: Lower chest: Bibasilar subpleural atelectasis/scarring. The visualized lung bases are otherwise clear. No intra-abdominal free air or free fluid. Hepatobiliary: The liver is unremarkable. There is biliary dilatation, post cholecystectomy. Pancreas: Atrophy of the body of the pancreas with dilatation of the main pancreatic duct sequela of chronic pancreatitis. Mild peripancreatic haziness, likely chronic. Correlation with pancreatic enzymes recommended to exclude recurrent pancreatitis. No abscess. Spleen: Normal in size without focal abnormality. Adrenals/Urinary Tract: The adrenal glands are unremarkable. Small nonobstructing bilateral renal calculi measure up to 3 mm in the inferior pole of the left kidney. There is no hydronephrosis on either side. There is symmetric enhancement and excretion of contrast by both kidneys. The visualized ureters and urinary bladder  appear unremarkable. Stomach/Bowel: There is sigmoid diverticulosis with muscular hypertrophy. No active inflammatory changes. There is no bowel obstruction. The appendix is normal. Vascular/Lymphatic: Mild aortoiliac atherosclerotic disease. The IVC is unremarkable. There is high-grade narrowing of the porta splenic confluence due to adhesions. The splenic vein, and main portal vein are patent. No portal venous gas. There is no adenopathy. Upper abdominal collateral vessels noted. Reproductive: The prostate is grossly unremarkable.  No pelvic mass Other: Similar appearance of midline ventral hernias with a 20 fusion of a short segment of small bowel to the right of the midline. No obstruction. Musculoskeletal: Degenerative changes of the spine. No acute osseous pathology. IMPRESSION: 1. Sequela of chronic pancreatitis. Correlation with pancreatic enzymes recommended to exclude acute on chronic pancreatitis. No abscess. 2. Sigmoid diverticulosis. No bowel obstruction. Normal appendix. 3. Small nonobstructing bilateral renal calculi. No hydronephrosis. Electronically Signed   By: Angus Bark M.D.   On: 06/15/2023 15:50     Subjective: Pt reports that he is wanting to go home.  He has minimal pain now and tolerating soft foods and having bowel movements.   Discharge Exam: Vitals:   06/22/23 2054 06/23/23 0356  BP: 131/89 105/71  Pulse: 79 74  Resp: 20 16  Temp: 98.2 F (36.8 C) 98.5 F (36.9 C)  SpO2: 97% 94%   Vitals:   06/22/23 0352 06/22/23 1326 06/22/23 2054 06/23/23 0356  BP: 104/63 119/68 131/89 105/71  Pulse: 68 83 79 74  Resp: 16  20 16   Temp: 98.7 F (37.1 C) 99 F (37.2 C) 98.2 F (36.8 C) 98.5 F (36.9 C)  TempSrc: Oral Oral Oral Oral  SpO2: 95% 94% 97% 94%  Weight:      Height:       General: Pt is alert, awake, not in acute distress Cardiovascular: normal S1/S2 +, no rubs, no gallops Respiratory: CTA bilaterally, no wheezing, no rhonchi Abdominal: Soft, minimal  epigastric pain, ND, bowel sounds + Extremities: no edema, no cyanosis   The results of significant diagnostics from this hospitalization (including imaging, microbiology, ancillary and laboratory) are listed below for reference.     Microbiology: No results found for this or any previous visit (from the past 240 hours).   Labs: BNP (last 3 results) No results for input(s): "BNP" in the last 8760 hours. Basic Metabolic Panel: Recent Labs  Lab 06/17/23 0405 06/18/23 0437 06/19/23 0424 06/20/23 0519 06/21/23 0444  NA 140 141 141 139 139  K 3.5 3.3* 3.9 3.3* 3.6  CL 105 105 107 105 106  CO2  28 26 26 27 25   GLUCOSE 91 93 107* 98 104*  BUN 7* 6* 6* 6* 6*  CREATININE 0.72 0.71 0.80 0.77 0.77  CALCIUM 8.6* 9.1 8.9 8.8* 8.7*  MG 1.9 1.9 1.8 1.7 2.1   Liver Function Tests: Recent Labs  Lab 06/17/23 0405 06/18/23 0437 06/19/23 0424 06/20/23 0519  AST 50* 24 22 23   ALT 114* 87* 69* 64*  ALKPHOS 138* 133* 111 113  BILITOT 0.7 0.8 0.9 0.8  PROT 6.0* 6.8 6.3* 6.3*  ALBUMIN 3.3* 3.6 3.3* 3.4*   Recent Labs  Lab 06/22/23 1312  LIPASE 63*   No results for input(s): "AMMONIA" in the last 168 hours. CBC: Recent Labs  Lab 06/19/23 0424  WBC 5.0  HGB 13.5  HCT 40.2  MCV 86.8  PLT 187   Cardiac Enzymes: No results for input(s): "CKTOTAL", "CKMB", "CKMBINDEX", "TROPONINI" in the last 168 hours. BNP: Invalid input(s): "POCBNP" CBG: No results for input(s): "GLUCAP" in the last 168 hours. D-Dimer No results for input(s): "DDIMER" in the last 72 hours. Hgb A1c No results for input(s): "HGBA1C" in the last 72 hours. Lipid Profile No results for input(s): "CHOL", "HDL", "LDLCALC", "TRIG", "CHOLHDL", "LDLDIRECT" in the last 72 hours. Thyroid function studies No results for input(s): "TSH", "T4TOTAL", "T3FREE", "THYROIDAB" in the last 72 hours.  Invalid input(s): "FREET3" Anemia work up No results for input(s): "VITAMINB12", "FOLATE", "FERRITIN", "TIBC", "IRON",  "RETICCTPCT" in the last 72 hours. Urinalysis    Component Value Date/Time   COLORURINE YELLOW 06/15/2023 1235   APPEARANCEUR CLEAR 06/15/2023 1235   LABSPEC 1.020 06/15/2023 1235   PHURINE 5.0 06/15/2023 1235   GLUCOSEU 150 (A) 06/15/2023 1235   HGBUR NEGATIVE 06/15/2023 1235   BILIRUBINUR NEGATIVE 06/15/2023 1235   KETONESUR NEGATIVE 06/15/2023 1235   PROTEINUR NEGATIVE 06/15/2023 1235   UROBILINOGEN 0.2 06/22/2013 0333   NITRITE NEGATIVE 06/15/2023 1235   LEUKOCYTESUR NEGATIVE 06/15/2023 1235   Sepsis Labs Recent Labs  Lab 06/19/23 0424  WBC 5.0   Microbiology No results found for this or any previous visit (from the past 240 hours).  Time coordinating discharge: 40 mins   SIGNED:  Faustino Hook, MD  [facility] 06/23/2023, 12:02 PM How to contact the [facility] Attending or Consulting provider 7A - 7P or covering provider during after hours 7P -7A, for this patient?  Check the care team in [facility] and look for a) attending/consulting [facility] provider listed and b) the [facility] team listed Log into [email or web address] and use [facility]'s universal password to access. If you do not have the password, please contact the hospital operator. Locate the [facility] provider you are looking for under [facility] and page to a number that you can be directly reached. If you still have difficulty reaching the provider, please page the [facility] (Director on Call) for the Hospitalists listed on amion for assistance.

## 2023-06-23 NOTE — Plan of Care (Signed)
 Pt reports pain is improving in intensity and he is able to go longer in between the PRN doses   Problem: Health Behavior/Discharge Planning: Goal: Ability to manage health-related needs will improve Outcome: Progressing   Problem: Clinical Measurements: Goal: Will remain free from infection Outcome: Progressing Goal: Diagnostic test results will improve Outcome: Progressing   Problem: Activity: Goal: Risk for activity intolerance will decrease Outcome: Progressing   Problem: Coping: Goal: Level of anxiety will decrease Outcome: Progressing   Problem: Pain Managment: Goal: General experience of comfort will improve and/or be controlled Outcome: Progressing

## 2023-06-23 NOTE — Discharge Instructions (Signed)
IMPORTANT INFORMATION: PAY CLOSE ATTENTION   PHYSICIAN DISCHARGE INSTRUCTIONS  Follow with Primary care provider  Vyas, Dhruv B, MD  and other consultants as instructed by your Hospitalist Physician  SEEK MEDICAL CARE OR RETURN TO EMERGENCY ROOM IF SYMPTOMS COME BACK, WORSEN OR NEW PROBLEM DEVELOPS   Please note: You were cared for by a hospitalist during your hospital stay. Every effort will be made to forward records to your primary care provider.  You can request that your primary care provider send for your hospital records if they have not received them.  Once you are discharged, your primary care physician will handle any further medical issues. Please note that NO REFILLS for any discharge medications will be authorized once you are discharged, as it is imperative that you return to your primary care physician (or establish a relationship with a primary care physician if you do not have one) for your post hospital discharge needs so that they can reassess your need for medications and monitor your lab values.  Please get a complete blood count and chemistry panel checked by your Primary MD at your next visit, and again as instructed by your Primary MD.  Get Medicines reviewed and adjusted: Please take all your medications with you for your next visit with your Primary MD  Laboratory/radiological data: Please request your Primary MD to go over all hospital tests and procedure/radiological results at the follow up, please ask your primary care provider to get all Hospital records sent to his/her office.  In some cases, they will be blood work, cultures and biopsy results pending at the time of your discharge. Please request that your primary care provider follow up on these results.  If you are diabetic, please bring your blood sugar readings with you to your follow up appointment with primary care.    Please call and make your follow up appointments as soon as possible.    Also Note  the following: If you experience worsening of your admission symptoms, develop shortness of breath, life threatening emergency, suicidal or homicidal thoughts you must seek medical attention immediately by calling 911 or calling your MD immediately  if symptoms less severe.  You must read complete instructions/literature along with all the possible adverse reactions/side effects for all the Medicines you take and that have been prescribed to you. Take any new Medicines after you have completely understood and accpet all the possible adverse reactions/side effects.   Do not drive when taking Pain medications or sleeping medications (Benzodiazepines)  Do not take more than prescribed Pain, Sleep and Anxiety Medications. It is not advisable to combine anxiety,sleep and pain medications without talking with your primary care practitioner  Special Instructions: If you have smoked or chewed Tobacco  in the last 2 yrs please stop smoking, stop any regular Alcohol  and or any Recreational drug use.  Wear Seat belts while driving.  Do not drive if taking any narcotic, mind altering or controlled substances or recreational drugs or alcohol.       

## 2023-06-23 NOTE — Progress Notes (Signed)
 Patient discharged home. All questions and concerns answered. Patient has all personal belongings, including discharge paperwork. Patient left the floor via ambulation to POV.

## 2023-06-23 NOTE — Discharge Summary (Addendum)
 Date of Service: 06/23/2023 12:02 PM   Signed     Expand All Collapse All  Physician Discharge Summary  Justin [name] YQM:[id number] DOB: 04/23/1955 DOA: 06/15/2023   PCP: Orlena Bitters, MD   Admit date: 06/15/2023 Discharge date: 06/23/2023   Admitted From:  Home  Disposition: Home    Recommendations for Outpatient Follow-up:  Follow up with PCP in 1 weeks Follow up with [facility] in 2-3 weeks    Home Health: NA   Discharge Condition: STABLE   CODE STATUS: FULL DIET: soft foods, advance as tolerated, use sucralfate with meals   Brief Hospitalization Summary: Please see all hospital notes, images, labs for full details of the hospitalization. Brief Admission History:  68 y.o. 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  Patient presents with abdominal pain over the last 7 days.  This has been associated with nausea, vomiting.  He has been admitted with acute on chronic pancreatitis and has a history of the same previously and has been seen at [facility] for this.  He remains on aggressive IV fluids as well as clear liquid diet and has been started on PPI as well as Carafate per GI.  MRCP with no significant findings noted.  Continues to remain on liquid diet and  symptomatic.  GI continues to follow.    Assessment and Plan:   Acute on chronic pancreatitis - Improved  - Pt doing much better now and tolerating soft diet and having bowel movements  - he was treated with supportive measures - appreciate GI recommendations - he reports minimal pain now which is a big improvement - DC home today. Pt asking to go home by lunch time today   Chronic constipation  - he is having BMs after treatment with miralax   Hypothyroidism - resumed home levothyroxine   CAD - stable    Chronic pain  - Stable, resume home treatments    Discharge Diagnoses:  Principal  Problem:   Acute on chronic pancreatitis (HCC) Active Problems:   CAD (coronary artery disease) of artery bypass graft   Hypothyroidism   Elevated triglycerides with high cholesterol   Opioid-induced constipation     Discharge Instructions:   Allergies as of 06/23/2023         Reactions    Amoxicillin Hives, Swelling    Dilaudid [hydromorphone Hcl] Swelling    Hydromorphone Swelling    Methotrexate Other (See Comments)    Pt's spouse stated this drug made the pt have pancreatitis    Morphine Hives    Hives, sweating, twitching            Medication List       STOP taking these medications     naproxen 500 MG tablet Commonly known as: NAPROSYN           TAKE these medications     albuterol 108 (90 Base) MCG/ACT inhaler Commonly known as: VENTOLIN HFA Inhale 1-2 puffs into the lungs every 6 (six) hours as needed for wheezing or shortness of breath.    aspirin 81 MG chewable tablet Chew 81 mg by mouth daily.    clonazePAM 0.5 MG tablet Commonly known as: KLONOPIN Take 0.5 mg by mouth at bedtime. *May take one tablet three times daily as needed for anxiety*    DULoxetine 60 MG capsule Commonly known as: CYMBALTA Take 60 mg by mouth every morning.    gabapentin  600 MG tablet Commonly known as: NEURONTIN Take 600 mg by mouth 3 (three) times daily.    hydrocodone-ibuprofen 5-200 MG tablet Commonly known as: VICOPROFEN Take 1 tablet by mouth every 8 (eight) hours as needed for pain.    levothyroxine 112 MCG tablet Commonly known as: SYNTHROID Take 112 mcg by mouth every morning.    MULTIVITAMIN ADULT PO Take 1 tablet by mouth daily.    nystatin ointment Commonly known as: MYCOSTATIN Apply 1 Application topically 2 (two) times daily.    nystatin powder Commonly known as: MYCOSTATIN/NYSTOP Apply 1 Application topically 2 (two) times daily.    omeprazole 40 MG capsule Commonly known as: PRILOSEC Take 40 mg by mouth daily.    ondansetron 8 MG  disintegrating tablet Commonly known as: Zofran ODT Take 1 tablet (8 mg total) by mouth every 8 (eight) hours as needed for nausea or vomiting.    oxybutynin 10 MG 24 hr tablet Commonly known as: DITROPAN-XL Take 10 mg by mouth daily.    oxyCODONE-acetaminophen 5-325 MG tablet Commonly known as: PERCOCET/ROXICET Take 1 tablet by mouth every 6 (six) hours as needed for severe pain.    Pancrelipase (Lip-Prot-Amyl) 6000-19000 units Cpep Take 2-4 capsules by mouth 5 (five) times daily. Patient takes 4 capsules by mouth three times a day with meals and 2 capsules by mouth twice a day with snacks    rosuvastatin 5 MG tablet Commonly known as: CRESTOR Take 5 mg by mouth daily.    sennosides-docusate sodium 8.6-50 MG tablet Commonly known as: SENOKOT-S Take 1 tablet by mouth daily.    sucralfate 1 g tablet Commonly known as: Carafate Take 1 tablet (1 g total) by mouth 4 (four) times daily -  with meals and at bedtime.    tamsulosin 0.4 MG Caps capsule Commonly known as: FLOMAX Take 0.4 mg by mouth 2 (two) times daily.    tiZANidine 4 MG tablet Commonly known as: ZANAFLEX Take 1 tablet by mouth 3 (three) times daily.    VITAMIN B COMPLEX PO Take 1 tablet by mouth daily.    Vitamin D3 25 MCG (1000 UT) Caps Take 1,000 Units by mouth.             Follow-up Information       Vyas, Dhruv B, MD. Schedule an appointment as soon as possible for a visit in 1 week(s).   Specialty: Internal Medicine Why: Hospital Follow Up Contact information: [address] [place] [address] [phone]              [facility]. Schedule an appointment as soon as possible for a visit in 2 week(s).   Why: Hospital Follow Up Contact information: [address] [place]  [address] [phone]                      Allergies       Allergies  Allergen Reactions   Amoxicillin Hives and Swelling   Dilaudid [Hydromorphone Hcl] Swelling    Hydromorphone Swelling   Methotrexate Other (See Comments)      Pt's spouse stated this drug made the pt have pancreatitis   Morphine Hives      Hives, sweating, twitching      Allergies as of 06/23/2023         Reactions    Amoxicillin Hives, Swelling    Dilaudid [hydromorphone Hcl] Swelling    Hydromorphone Swelling    Methotrexate Other (See Comments)    Pt's  spouse stated this drug made the pt have pancreatitis    Morphine Hives    Hives, sweating, twitching            Medication List       STOP taking these medications     naproxen 500 MG tablet Commonly known as: NAPROSYN           TAKE these medications     albuterol 108 (90 Base) MCG/ACT inhaler Commonly known as: VENTOLIN HFA Inhale 1-2 puffs into the lungs every 6 (six) hours as needed for wheezing or shortness of breath.    aspirin 81 MG chewable tablet Chew 81 mg by mouth daily.    clonazePAM 0.5 MG tablet Commonly known as: KLONOPIN Take 0.5 mg by mouth at bedtime. *May take one tablet three times daily as needed for anxiety*    DULoxetine 60 MG capsule Commonly known as: CYMBALTA Take 60 mg by mouth every morning.    gabapentin 600 MG tablet Commonly known as: NEURONTIN Take 600 mg by mouth 3 (three) times daily.    hydrocodone-ibuprofen 5-200 MG tablet Commonly known as: VICOPROFEN Take 1 tablet by mouth every 8 (eight) hours as needed for pain.    levothyroxine 112 MCG tablet Commonly known as: SYNTHROID Take 112 mcg by mouth every morning.    MULTIVITAMIN ADULT PO Take 1 tablet by mouth daily.    nystatin ointment Commonly known as: MYCOSTATIN Apply 1 Application topically 2 (two) times daily.    nystatin powder Commonly known as: MYCOSTATIN/NYSTOP Apply 1 Application topically 2 (two) times daily.    omeprazole 40 MG capsule Commonly known as: PRILOSEC Take 40 mg by mouth daily.    ondansetron 8 MG disintegrating tablet Commonly known as: Zofran ODT Take 1 tablet (8 mg  total) by mouth every 8 (eight) hours as needed for nausea or vomiting.    oxybutynin 10 MG 24 hr tablet Commonly known as: DITROPAN-XL Take 10 mg by mouth daily.    oxyCODONE-acetaminophen 5-325 MG tablet Commonly known as: PERCOCET/ROXICET Take 1 tablet by mouth every 6 (six) hours as needed for severe pain.    Pancrelipase (Lip-Prot-Amyl) 6000-19000 units Cpep Take 2-4 capsules by mouth 5 (five) times daily. Patient takes 4 capsules by mouth three times a day with meals and 2 capsules by mouth twice a day with snacks    rosuvastatin 5 MG tablet Commonly known as: CRESTOR Take 5 mg by mouth daily.    sennosides-docusate sodium 8.6-50 MG tablet Commonly known as: SENOKOT-S Take 1 tablet by mouth daily.    sucralfate 1 g tablet Commonly known as: Carafate Take 1 tablet (1 g total) by mouth 4 (four) times daily -  with meals and at bedtime.    tamsulosin 0.4 MG Caps capsule Commonly known as: FLOMAX Take 0.4 mg by mouth 2 (two) times daily.    tiZANidine 4 MG tablet Commonly known as: ZANAFLEX Take 1 tablet by mouth 3 (three) times daily.    VITAMIN B COMPLEX PO Take 1 tablet by mouth daily.    Vitamin D3 25 MCG (1000 UT) Caps Take 1,000 Units by mouth.             Procedures/Studies: Imaging Results  MR ABDOMEN MRCP W WO CONTAST Result Date: 06/16/2023 CLINICAL DATA:  Acute on chronic pancreatitis. EXAM: MRI ABDOMEN WITHOUT AND WITH CONTRAST (INCLUDING MRCP) TECHNIQUE: Multiplanar multisequence MR imaging of the abdomen was performed both before and after the administration of intravenous contrast. Heavily T2-weighted images of  the biliary and pancreatic ducts were obtained, and three-dimensional MRCP images were rendered by post processing. CONTRAST:  7mL GADAVIST GADOBUTROL 1 MMOL/ML IV SOLN COMPARISON:  CT on 06/15/2023 FINDINGS: Lower chest: No acute findings. Hepatobiliary: No hepatic masses identified. Prior cholecystectomy noted. Mild dilatation of intrahepatic  bile ducts is seen, while the common duct measures 7 mm in diameter which is within normal limits. No evidence of choledocholithiasis or biliary stricture. Pancreas: Atrophy and ductal dilatation is seen involving the pancreatic body and tail. Dilated pancreatic duct terminates in the pancreatic neck, where there is evidence fibrosis and tethering of the posterior wall of the distal stomach. No masses seen at this site. No evidence of pancreatic edema or acute peripancreatic inflammatory changes. No abnormal fluid collections are identified. Spleen:  Within normal limits in size and appearance. Adrenals/Urinary Tract: No suspicious masses identified. No evidence of hydronephrosis. Stomach/Bowel: Diverticulosis involving the visualized portion of the descending colon, without signs of diverticulitis in this region. Vascular/Lymphatic: No pathologically enlarged lymph nodes identified. No acute vascular findings. Other: 2 small left paraumbilical ventral hernias are seen which contain only fat. A small right paraumbilical ventral hernia is seen which contains a small bowel loop. No evidence of bowel obstruction. Musculoskeletal:  No suspicious bone lesions identified. IMPRESSION: Atrophy and pancreatic ductal dilatation involving the body and tail, with fibrosis and tethering of pancreatic neck to the adjacent distal stomach. This may be due to sequelae of chronic pancreatitis or prior traumatic injury. No radiographic evidence of acute pancreatitis or mass. Prior cholecystectomy. Mild intrahepatic biliary ductal dilatation, without evidence of choledocholithiasis or biliary stricture. Small right paraumbilical ventral hernia which contains a small bowel loop. No evidence of bowel obstruction. Two small left paraumbilical ventral hernias which contain only fat. Colonic diverticulosis, without radiographic evidence of diverticulitis. Electronically Signed   By: Marlyce Sine M.D.   On: 06/16/2023 16:30    MR 3D Recon  At Scanner Result Date: 06/16/2023 CLINICAL DATA:  Acute on chronic pancreatitis. EXAM: MRI ABDOMEN WITHOUT AND WITH CONTRAST (INCLUDING MRCP) TECHNIQUE: Multiplanar multisequence MR imaging of the abdomen was performed both before and after the administration of intravenous contrast. Heavily T2-weighted images of the biliary and pancreatic ducts were obtained, and three-dimensional MRCP images were rendered by post processing. CONTRAST:  7mL GADAVIST GADOBUTROL 1 MMOL/ML IV SOLN COMPARISON:  CT on 06/15/2023 FINDINGS: Lower chest: No acute findings. Hepatobiliary: No hepatic masses identified. Prior cholecystectomy noted. Mild dilatation of intrahepatic bile ducts is seen, while the common duct measures 7 mm in diameter which is within normal limits. No evidence of choledocholithiasis or biliary stricture. Pancreas: Atrophy and ductal dilatation is seen involving the pancreatic body and tail. Dilated pancreatic duct terminates in the pancreatic neck, where there is evidence fibrosis and tethering of the posterior wall of the distal stomach. No masses seen at this site. No evidence of pancreatic edema or acute peripancreatic inflammatory changes. No abnormal fluid collections are identified. Spleen:  Within normal limits in size and appearance. Adrenals/Urinary Tract: No suspicious masses identified. No evidence of hydronephrosis. Stomach/Bowel: Diverticulosis involving the visualized portion of the descending colon, without signs of diverticulitis in this region. Vascular/Lymphatic: No pathologically enlarged lymph nodes identified. No acute vascular findings. Other: 2 small left paraumbilical ventral hernias are seen which contain only fat. A small right paraumbilical ventral hernia is seen which contains a small bowel loop. No evidence of bowel obstruction. Musculoskeletal:  No suspicious bone lesions identified. IMPRESSION: Atrophy and pancreatic ductal dilatation involving the body  and tail, with fibrosis and  tethering of pancreatic neck to the adjacent distal stomach. This may be due to sequelae of chronic pancreatitis or prior traumatic injury. No radiographic evidence of acute pancreatitis or mass. Prior cholecystectomy. Mild intrahepatic biliary ductal dilatation, without evidence of choledocholithiasis or biliary stricture. Small right paraumbilical ventral hernia which contains a small bowel loop. No evidence of bowel obstruction. Two small left paraumbilical ventral hernias which contain only fat. Colonic diverticulosis, without radiographic evidence of diverticulitis. Electronically Signed   By: Marlyce Sine M.D.   On: 06/16/2023 16:30    CT ABDOMEN PELVIS W CONTRAST Result Date: 06/15/2023 CLINICAL DATA:  Abdominal pain.  History of pancreatitis. EXAM: CT ABDOMEN AND PELVIS WITH CONTRAST TECHNIQUE: Multidetector CT imaging of the abdomen and pelvis was performed using the standard protocol following bolus administration of intravenous contrast. RADIATION DOSE REDUCTION: This exam was performed according to the departmental dose-optimization program which includes automated exposure control, adjustment of the mA and/or kV according to patient size and/or use of iterative reconstruction technique. CONTRAST:  100mL OMNIPAQUE IOHEXOL 300 MG/ML  SOLN COMPARISON:  CT abdomen pelvis dated 10/23/2022. FINDINGS: Lower chest: Bibasilar subpleural atelectasis/scarring. The visualized lung bases are otherwise clear. No intra-abdominal free air or free fluid. Hepatobiliary: The liver is unremarkable. There is biliary dilatation, post cholecystectomy. Pancreas: Atrophy of the body of the pancreas with dilatation of the main pancreatic duct sequela of chronic pancreatitis. Mild peripancreatic haziness, likely chronic. Correlation with pancreatic enzymes recommended to exclude recurrent pancreatitis. No abscess. Spleen: Normal in size without focal abnormality. Adrenals/Urinary Tract: The adrenal glands are unremarkable.  Small nonobstructing bilateral renal calculi measure up to 3 mm in the inferior pole of the left kidney. There is no hydronephrosis on either side. There is symmetric enhancement and excretion of contrast by both kidneys. The visualized ureters and urinary bladder appear unremarkable. Stomach/Bowel: There is sigmoid diverticulosis with muscular hypertrophy. No active inflammatory changes. There is no bowel obstruction. The appendix is normal. Vascular/Lymphatic: Mild aortoiliac atherosclerotic disease. The IVC is unremarkable. There is high-grade narrowing of the porta splenic confluence due to adhesions. The splenic vein, and main portal vein are patent. No portal venous gas. There is no adenopathy. Upper abdominal collateral vessels noted. Reproductive: The prostate is grossly unremarkable.  No pelvic mass Other: Similar appearance of midline ventral hernias with a 20 fusion of a short segment of small bowel to the right of the midline. No obstruction. Musculoskeletal: Degenerative changes of the spine. No acute osseous pathology. IMPRESSION: 1. Sequela of chronic pancreatitis. Correlation with pancreatic enzymes recommended to exclude acute on chronic pancreatitis. No abscess. 2. Sigmoid diverticulosis. No bowel obstruction. Normal appendix. 3. Small nonobstructing bilateral renal calculi. No hydronephrosis. Electronically Signed   By: Angus Bark M.D.   On: 06/15/2023 15:50       Subjective: Pt reports that he is wanting to go home.  He has minimal pain now and tolerating soft foods and having bowel movements.    Discharge Exam:     Vitals:    06/22/23 2054 06/23/23 0356  BP: 131/89 105/71  Pulse: 79 74  Resp: 20 16  Temp: 98.2 F (36.8 C) 98.5 F (36.9 C)  SpO2: 97% 94%          Vitals:    06/22/23 0352 06/22/23 1326 06/22/23 2054 06/23/23 0356  BP: 104/63 119/68 131/89 105/71  Pulse: 68 83 79 74  Resp: 16   20 16   Temp: 98.7 F (37.1 C) 99 F (  37.2 C) 98.2 F (36.8 C) 98.5 F  (36.9 C)  TempSrc: Oral Oral Oral Oral  SpO2: 95% 94% 97% 94%  Weight:          Height:            General: Pt is alert, awake, not in acute distress Cardiovascular: normal S1/S2 +, no rubs, no gallops Respiratory: CTA bilaterally, no wheezing, no rhonchi Abdominal: Soft, minimal epigastric pain, ND, bowel sounds + Extremities: no edema, no cyanosis   The results of significant diagnostics from this hospitalization (including imaging, microbiology, ancillary and laboratory) are listed below for reference.       Microbiology: No results found for this or any previous visit (from the past 240 hours).    Labs: BNP (last 3 results) Recent Labs (within last 365 days)  No results for input(s): "BNP" in the last 8760 hours.   Basic Metabolic Panel: Last Labs         Recent Labs  Lab 06/17/23 0405 06/18/23 0437 06/19/23 0424 06/20/23 0519 06/21/23 0444  NA 140 141 141 139 139  K 3.5 3.3* 3.9 3.3* 3.6  CL 105 105 107 105 106  CO2 28 26 26 27 25   GLUCOSE 91 93 107* 98 104*  BUN 7* 6* 6* 6* 6*  CREATININE 0.72 0.71 0.80 0.77 0.77  CALCIUM 8.6* 9.1 8.9 8.8* 8.7*  MG 1.9 1.9 1.8 1.7 2.1      Liver Function Tests: Last Labs        Recent Labs  Lab 06/17/23 0405 06/18/23 0437 06/19/23 0424 06/20/23 0519  AST 50* 24 22 23   ALT 114* 87* 69* 64*  ALKPHOS 138* 133* 111 113  BILITOT 0.7 0.8 0.9 0.8  PROT 6.0* 6.8 6.3* 6.3*  ALBUMIN 3.3* 3.6 3.3* 3.4*      Last Labs     Recent Labs  Lab 06/22/23 1312  LIPASE 63*      Last Labs  No results for input(s): "AMMONIA" in the last 168 hours.   CBC: Last Labs     Recent Labs  Lab 06/19/23 0424  WBC 5.0  HGB 13.5  HCT 40.2  MCV 86.8  PLT 187      Cardiac Enzymes: Last Labs  No results for input(s): "CKTOTAL", "CKMB", "CKMBINDEX", "TROPONINI" in the last 168 hours.   BNP: Last Labs  Invalid input(s): "POCBNP"   CBG: Last Labs  No results for input(s): "GLUCAP" in the last 168 hours.   D-Dimer Recent  Labs (last 2 labs)  No results for input(s): "DDIMER" in the last 72 hours.   Hgb A1c Recent Labs (last 2 labs)  No results for input(s): "HGBA1C" in the last 72 hours.   Lipid Profile Recent Labs (last 2 labs)  No results for input(s): "CHOL", "HDL", "LDLCALC", "TRIG", "CHOLHDL", "LDLDIRECT" in the last 72 hours.   Thyroid function studies  Recent Labs (last 2 labs)  No results for input(s): "TSH", "T4TOTAL", "T3FREE", "THYROIDAB" in the last 72 hours.   Invalid input(s): "FREET3"   Anemia work up [employer] (last 2 labs)  No results for input(s): "VITAMINB12", "FOLATE", "FERRITIN", "TIBC", "IRON", "RETICCTPCT" in the last 72 hours.   Urinalysis Labs (Brief)          Component Value Date/Time    COLORURINE YELLOW 06/15/2023 1235    APPEARANCEUR CLEAR 06/15/2023 1235    LABSPEC 1.020 06/15/2023 1235    PHURINE 5.0 06/15/2023 1235    GLUCOSEU 150 (A) 06/15/2023 1235  HGBUR NEGATIVE 06/15/2023 1235    BILIRUBINUR NEGATIVE 06/15/2023 1235    KETONESUR NEGATIVE 06/15/2023 1235    PROTEINUR NEGATIVE 06/15/2023 1235    UROBILINOGEN 0.2 06/22/2013 0333    NITRITE NEGATIVE 06/15/2023 1235    LEUKOCYTESUR NEGATIVE 06/15/2023 1235      Sepsis Labs Last Labs     Recent Labs  Lab 06/19/23 0424  WBC 5.0      Microbiology No results found for this or any previous visit (from the past 240 hours).   Time coordinating discharge: 40 mins    SIGNED:   Faustino Hook, MD             [facility] 06/23/2023, 12:02 PM How to contact the [facility] Attending or Consulting provider 7A - 7P or covering provider during after hours 7P -7A, for this patient?  Check the care team in [facility] and look for a) attending/consulting [facility] provider listed and b) the [facility] team listed Log into [email or web address] and use 's universal password to access. If you do not have the password, please contact the hospital operator. Locate the [facility] provider you are looking for under [facility]  and page to a number that you can be directly reached. If you still have difficulty reaching the provider, please page the [facility] (Director on Call) for the Hospitalists listed on amion for assistance.

## 2023-06-28 DIAGNOSIS — I10 Essential (primary) hypertension: Secondary | ICD-10-CM | POA: Diagnosis not present

## 2023-06-28 DIAGNOSIS — Z09 Encounter for follow-up examination after completed treatment for conditions other than malignant neoplasm: Secondary | ICD-10-CM | POA: Diagnosis not present

## 2023-06-28 DIAGNOSIS — K279 Peptic ulcer, site unspecified, unspecified as acute or chronic, without hemorrhage or perforation: Secondary | ICD-10-CM | POA: Diagnosis not present

## 2023-06-28 DIAGNOSIS — K861 Other chronic pancreatitis: Secondary | ICD-10-CM | POA: Diagnosis not present

## 2023-07-02 ENCOUNTER — Other Ambulatory Visit: Payer: Self-pay

## 2023-07-02 ENCOUNTER — Emergency Department (HOSPITAL_COMMUNITY)
Admission: EM | Admit: 2023-07-02 | Discharge: 2023-07-02 | Disposition: A | Discharge status: Home / Self Care | Attending: Emergency Medicine | Admitting: Emergency Medicine

## 2023-07-02 ENCOUNTER — Encounter (HOSPITAL_COMMUNITY): Payer: Self-pay

## 2023-07-02 DIAGNOSIS — Z7982 Long term (current) use of aspirin: Secondary | ICD-10-CM | POA: Diagnosis not present

## 2023-07-02 DIAGNOSIS — R52 Pain, unspecified: Secondary | ICD-10-CM | POA: Insufficient documentation

## 2023-07-02 DIAGNOSIS — R1030 Lower abdominal pain, unspecified: Secondary | ICD-10-CM | POA: Diagnosis not present

## 2023-07-02 LAB — CBC WITH DIFFERENTIAL/PLATELET
Abs Immature Granulocytes: 0.01 10*3/uL (ref 0.00–0.07)
Basophils Absolute: 0 10*3/uL (ref 0.0–0.1)
Basophils Relative: 0 %
Eosinophils Absolute: 0.1 10*3/uL (ref 0.0–0.5)
Eosinophils Relative: 3 %
HCT: 38.4 % — ABNORMAL LOW (ref 39.0–52.0)
Hemoglobin: 12.8 g/dL — ABNORMAL LOW (ref 13.0–17.0)
Immature Granulocytes: 0 %
Lymphocytes Relative: 18 %
Lymphs Abs: 0.9 10*3/uL (ref 0.7–4.0)
MCH: 28.9 pg (ref 26.0–34.0)
MCHC: 33.3 g/dL (ref 30.0–36.0)
MCV: 86.7 fL (ref 80.0–100.0)
Monocytes Absolute: 0.3 10*3/uL (ref 0.1–1.0)
Monocytes Relative: 7 %
Neutro Abs: 3.4 10*3/uL (ref 1.7–7.7)
Neutrophils Relative %: 72 %
Platelets: 190 10*3/uL (ref 150–400)
RBC: 4.43 MIL/uL (ref 4.22–5.81)
RDW: 13.3 % (ref 11.5–15.5)
WBC: 4.7 10*3/uL (ref 4.0–10.5)
nRBC: 0 % (ref 0.0–0.2)

## 2023-07-02 LAB — URINALYSIS, ROUTINE W REFLEX MICROSCOPIC
Bilirubin Urine: NEGATIVE
Glucose, UA: NEGATIVE mg/dL
Hgb urine dipstick: NEGATIVE
Ketones, ur: NEGATIVE mg/dL
Leukocytes,Ua: NEGATIVE
Nitrite: NEGATIVE
Protein, ur: NEGATIVE mg/dL
Specific Gravity, Urine: 1.006 (ref 1.005–1.030)
pH: 7 (ref 5.0–8.0)

## 2023-07-02 LAB — COMPREHENSIVE METABOLIC PANEL WITH GFR
ALT: 25 U/L (ref 0–44)
AST: 12 U/L — ABNORMAL LOW (ref 15–41)
Albumin: 3.4 g/dL — ABNORMAL LOW (ref 3.5–5.0)
Alkaline Phosphatase: 88 U/L (ref 38–126)
Anion gap: 8 (ref 5–15)
BUN: 6 mg/dL — ABNORMAL LOW (ref 8–23)
CO2: 25 mmol/L (ref 22–32)
Calcium: 8.5 mg/dL — ABNORMAL LOW (ref 8.9–10.3)
Chloride: 108 mmol/L (ref 98–111)
Creatinine, Ser: 0.66 mg/dL (ref 0.61–1.24)
GFR, Estimated: >60 mL/min (ref >60)
Glucose, Bld: 124 mg/dL — ABNORMAL HIGH (ref 70–99)
Potassium: 3.2 mmol/L — ABNORMAL LOW (ref 3.5–5.1)
Sodium: 141 mmol/L (ref 135–145)
Total Bilirubin: 0.5 mg/dL (ref 0.0–1.2)
Total Protein: 6.5 g/dL (ref 6.5–8.1)

## 2023-07-02 LAB — CBG MONITORING, ED: Glucose-Capillary: 135 mg/dL — ABNORMAL HIGH (ref 70–99)

## 2023-07-02 LAB — LIPASE, BLOOD: Lipase: 31 U/L (ref 11–51)

## 2023-07-02 MED ORDER — SODIUM CHLORIDE 0.9 % IV BOLUS
500.0000 mL | Freq: Once | INTRAVENOUS | Status: AC
  Administered 2023-07-02: 500 mL via INTRAVENOUS

## 2023-07-02 MED ORDER — FENTANYL CITRATE PF 50 MCG/ML IJ SOSY
50.0000 ug | PREFILLED_SYRINGE | Freq: Once | INTRAMUSCULAR | Status: AC
  Administered 2023-07-02: 50 ug via INTRAVENOUS
  Filled 2023-07-02: qty 1

## 2023-07-02 MED ORDER — FENTANYL 12 MCG/HR TD PT72
1.0000 | MEDICATED_PATCH | TRANSDERMAL | Status: DC
  Administered 2023-07-02: 1 via TRANSDERMAL

## 2023-07-02 NOTE — Discharge Instructions (Signed)
 Today's evaluation has been reassuring, though your pain is notable and does require ongoing follow-up with your primary care physician as well as your gastroenterology team.  In particular, contact your primary care physician, regarding today's visit and the medicated patch that you were provided for pain control.  Discuss this as an option for pain management while you work with your gastroenterology team.  Return here for concerning changes in your condition.

## 2023-07-02 NOTE — ED Notes (Signed)
 Pt stated, "stomach is starting to hurt again. Can I get some more medicine." EDP notified

## 2023-07-02 NOTE — ED Provider Notes (Signed)
 [place] EMERGENCY DEPARTMENT AT [facility] Provider Note   CSN: [id number] Arrival date & time: 07/02/23  1043     History  Chief Complaint  Patient presents with   Abdominal Pain    Justin [name] is a 68 y.o. male.  HPI Patient presents with abdominal pain.  He is coming by his wife who assists with the history. Patient was admitted, discharged 10 days ago following similar presentation.  They note that the pain is been there for years, worse over the past month.  Patient has a history of pancreatitis, is to be followed at [facility] but has been unable to follow-up with his team there.  Since discharge pain has been persistent, worse today, prompting evaluation.  He has had decreased p.o. intake, noting that he has increasing abdominal pain with solids, but he has been able to eat soup recently.  No vomiting, though he is nauseous, pain is focal in the left upper abdomen, similar to prior.    Home Medications Prior to Admission medications   Medication Sig Start Date End Date Taking? Authorizing Provider  albuterol  (VENTOLIN  HFA) 108 (90 Base) MCG/ACT inhaler Inhale 1-2 puffs into the lungs every 6 (six) hours as needed for wheezing or shortness of breath.    [provider]  aspirin  81 MG chewable tablet Chew 81 mg by mouth daily.     [provider]  B Complex Vitamins (VITAMIN B COMPLEX PO) Take 1 tablet by mouth daily.    [provider]  Cholecalciferol (VITAMIN D3) 1000 units CAPS Take 1,000 Units by mouth.     [provider]  clonazePAM  (KLONOPIN ) 0.5 MG tablet Take 0.5 mg by mouth at bedtime. *May take one tablet three times daily as needed for anxiety* 12/14/14   [provider]  DULoxetine  (CYMBALTA ) 60 MG capsule Take 60 mg by mouth every morning.    [provider]  gabapentin  (NEURONTIN ) 600 MG tablet Take 600 mg by mouth 3 (three) times daily.    [provider]  hydrocodone -ibuprofen  (VICOPROFEN) 5-200 MG tablet Take 1 tablet by mouth every 8 (eight) hours as needed for pain.    [provider]  levothyroxine  (SYNTHROID , LEVOTHROID) 112 MCG tablet Take 112 mcg by mouth every morning.     [provider]  Multiple Vitamins-Minerals (MULTIVITAMIN ADULT PO) Take 1 tablet by mouth daily.    [provider]  nystatin (MYCOSTATIN/NYSTOP) powder Apply 1 Application topically 2 (two) times daily. 05/24/23   [provider]  nystatin ointment (MYCOSTATIN) Apply 1 Application topically 2 (two) times daily. 05/07/19   [provider]  omeprazole (PRILOSEC) 40 MG capsule Take 40 mg by mouth daily. 05/07/23   [provider]  ondansetron  (ZOFRAN  ODT) 8 MG disintegrating tablet Take 1 tablet (8 mg total) by mouth every 8 (eight) hours as needed for nausea or vomiting. 03/25/20   Justina Oman, MD  oxybutynin  (DITROPAN -XL) 10 MG 24 hr tablet Take 10 mg by mouth daily. 04/04/23   [provider]  oxyCODONE -acetaminophen  (PERCOCET/ROXICET) 5-325 MG tablet Take 1 tablet by mouth every 6 (six) hours as needed for severe pain. 10/23/22   Thomes Flicker, PA  Pancrelipase , Lip-Prot-Amyl, 6000-19000 units CPEP Take 2-4 capsules by mouth 5 (five) times daily. Patient takes 4 capsules by mouth three times a day with meals and 2 capsules by mouth twice a day with snacks    [provider]  rosuvastatin (CRESTOR) 5 MG tablet Take 5  mg by mouth daily. 09/13/22   [provider]  sennosides-docusate sodium  (SENOKOT-S) 8.6-50 MG tablet Take 1 tablet by mouth daily.    [provider]  sucralfate  (CARAFATE ) 1 g tablet Take 1 tablet (1 g total) by mouth 4 (four) times daily -  with meals and at bedtime. 06/23/23 06/22/24  Johnson, Clanford L, MD  tamsulosin  (FLOMAX ) 0.4 MG CAPS capsule Take 0.4 mg by mouth 2 (two) times daily.  12/28/14   [provider]  tiZANidine  (ZANAFLEX ) 4 MG tablet Take 1 tablet by mouth 3 (three) times  daily. 03/08/22   [provider]      Allergies    Amoxicillin, Dilaudid  [hydromorphone  hcl], Hydromorphone , Methotrexate, and Morphine     Review of Systems   Review of Systems  Physical Exam Updated Vital Signs BP 119/74   Pulse 62   Temp 98.8 F (37.1 C) (Oral)   Resp 12   Ht 5\' 7"  (1.702 m)   Wt 69.9 kg   SpO2 96%   BMI 24.12 kg/m  Physical Exam Vitals and nursing note reviewed.  Constitutional:      General: He is not in acute distress.    Appearance: He is well-developed.  HENT:     Head: Normocephalic and atraumatic.  Eyes:     Conjunctiva/sclera: Conjunctivae normal.  Cardiovascular:     Rate and Rhythm: Normal rate and regular rhythm.  Pulmonary:     Effort: Pulmonary effort is normal. No respiratory distress.     Breath sounds: No stridor.  Abdominal:     General: There is no distension.  Skin:    General: Skin is warm and dry.  Neurological:     Mental Status: He is alert and oriented to person, place, and time.     ED Results / Procedures / Treatments   Labs (all labs ordered are listed, but only abnormal results are displayed) Labs Reviewed  COMPREHENSIVE METABOLIC PANEL WITH GFR - Abnormal; Notable for the following components:      Result Value   Potassium 3.2 (*)    Glucose, Bld 124 (*)    BUN 6 (*)    Calcium  8.5 (*)    Albumin 3.4 (*)    AST 12 (*)    All other components within normal limits  CBC WITH DIFFERENTIAL/PLATELET - Abnormal; Notable for the following components:   Hemoglobin 12.8 (*)    HCT 38.4 (*)    All other components within normal limits  URINALYSIS, ROUTINE W REFLEX MICROSCOPIC - Abnormal; Notable for the following components:   Color, Urine STRAW (*)    All other components within normal limits  CBG MONITORING, ED - Abnormal; Notable for the following components:   Glucose-Capillary 135 (*)    All other components within normal limits  LIPASE, BLOOD    EKG None  Radiology No results  found.  Procedures Procedures    Medications Ordered in ED Medications  fentaNYL  (DURAGESIC ) 12 MCG/HR 1 patch (has no administration in time range)  fentaNYL  (SUBLIMAZE ) injection 50 mcg (50 mcg Intravenous Given 07/02/23 1238)  sodium chloride  0.9 % bolus 500 mL (0 mLs Intravenous Stopped 07/02/23 1359)  fentaNYL  (SUBLIMAZE ) injection 50 mcg (50 mcg Intravenous Given 07/02/23 1354)    ED Course/ Medical Decision Making/ A&P                                 Medical Decision Making Adult male  presents with abdominal pain, decreased p.o. intake secondary to discomfort, nausea in the context of known chronic pancreatitis, recent hospitalization with thorough evaluation including imaging studies included below. Concern for acute on chronic pancreatitis, necrosis, infection, less likely obstruction given the denial of vomiting. Analgesics ordered, cardiac 85 sinus normal Pulse ox 94% room air normal  Amount and/or Complexity of Data Reviewed Independent Historian: spouse External Data Reviewed: notes.    Details: MRCP results included below Labs: ordered. Decision-making details documented in ED Course. Radiology: independent interpretation performed. Decision-making details documented in ED Course.  Risk Prescription drug management. Decision regarding hospitalization. Diagnosis or treatment significantly limited by social determinants of health.  IMPRESSION: Atrophy and pancreatic ductal dilatation involving the body and tail, with fibrosis and tethering of pancreatic neck to the adjacent distal stomach. This may be due to sequelae of chronic pancreatitis or prior traumatic injury.   No radiographic evidence of acute pancreatitis or mass.   Prior cholecystectomy. Mild intrahepatic biliary ductal dilatation, without evidence of choledocholithiasis or biliary stricture.   Small right paraumbilical ventral hernia which contains a small bowel loop. No evidence of bowel  obstruction. Two small left paraumbilical ventral hernias which contain only fat.   Colonic diverticulosis, without radiographic evidence of diverticulitis. 2:28 PM Patient awake, alert, sitting upright, in no distress.  I discussed the patient's lab results with him, which are generally reassuring, trace electrolyte abnormalities, lipase within normal limits, and after review of the patient's recent hospitalization, studies as below, given reassuring vitals, patient has had 2 doses of pain medication and will transition to fentanyl  patch to facilitate outpatient follow-up in the coming days given his intolerance of morphine  and Dilaudid .        Final Clinical Impression(s) / ED Diagnoses Final diagnoses:  Pain    Rx / DC Orders ED Discharge Orders     None         Dorenda Gandy, MD 07/02/23 1428

## 2023-07-02 NOTE — ED Notes (Signed)
 Pt was provided crackers. Stated, "stomach hurts a little."

## 2023-07-02 NOTE — ED Triage Notes (Addendum)
 Family stated, "released from hospital recently for abdomen pain. inflamed panceas. Small intestine through pancreas and the valve is too small." Pt denies N/V or diarrhea. Denies urinary changes or blood in stool. Unable to eat at this time.   Pain is in lower left quadrant radiating to left back.

## 2023-07-03 DIAGNOSIS — R42 Dizziness and giddiness: Secondary | ICD-10-CM | POA: Diagnosis not present

## 2023-07-03 DIAGNOSIS — I10 Essential (primary) hypertension: Secondary | ICD-10-CM | POA: Diagnosis not present

## 2023-07-03 DIAGNOSIS — Z6823 Body mass index (BMI) 23.0-23.9, adult: Secondary | ICD-10-CM | POA: Diagnosis not present

## 2023-07-03 DIAGNOSIS — Z299 Encounter for prophylactic measures, unspecified: Secondary | ICD-10-CM | POA: Diagnosis not present

## 2023-07-03 DIAGNOSIS — R109 Unspecified abdominal pain: Secondary | ICD-10-CM | POA: Diagnosis not present

## 2023-07-09 ENCOUNTER — Telehealth: Payer: Self-pay

## 2023-07-09 DIAGNOSIS — K859 Acute pancreatitis without necrosis or infection, unspecified: Secondary | ICD-10-CM

## 2023-07-10 ENCOUNTER — Telehealth: Payer: Self-pay

## 2023-07-10 NOTE — Progress Notes (Signed)
 Complex Care Management Note  Care Guide Note 07/10/2023 Name: Justin [name] MRN: [id number] DOB: 1955-08-25  Justin [name] is a 68 y.o. year old male who sees Vyas, Dhruv B, MD for primary care. I reached out to Justin [name] by phone today to offer complex care management services.  [name] was given information about Complex Care Management services today including:   The Complex Care Management services include support from the care team which includes your Nurse Care Manager, Clinical Social Worker, or Pharmacist.  The Complex Care Management team is here to help remove barriers to the health concerns and goals most important to you. Complex Care Management services are voluntary, and the patient may decline or stop services at any time by request to their care team member.   Complex Care Management Consent Status: Patient wishes to consider information provided and/or speak with a member of the care team before deciding to participate in complex care management services.   Follow up plan:  The care guide will reach out to the patient again over the next 7 days.  Encounter Outcome:  Patient Request to Call Back  [facility]  [facility], [facility] Health Care Management Assistant Direct Dial: [phone]  Fax: [phone]

## 2023-07-11 NOTE — Progress Notes (Signed)
 Complex Care Management Note Care Guide Note  07/11/2023 Name: Justin [name] MRN: [id number] DOB: September 06, 1955   Complex Care Management Outreach Attempts: A third unsuccessful outreach was attempted today to offer the patient with information about available complex care management services.  Follow Up Plan:  No further outreach attempts will be made at this time. We have been unable to contact the patient to offer or enroll patient in complex care management services.  Encounter Outcome:  No Answer  [facility]  [facility], [facility] Health Care Management Assistant Direct Dial: [phone]  Fax: [phone]

## 2023-07-12 DIAGNOSIS — K861 Other chronic pancreatitis: Secondary | ICD-10-CM | POA: Diagnosis not present

## 2023-07-12 DIAGNOSIS — K859 Acute pancreatitis without necrosis or infection, unspecified: Secondary | ICD-10-CM | POA: Diagnosis not present

## 2023-07-12 DIAGNOSIS — K8689 Other specified diseases of pancreas: Secondary | ICD-10-CM | POA: Diagnosis not present

## 2023-07-16 ENCOUNTER — Encounter: Payer: Self-pay | Admitting: Internal Medicine

## 2023-07-26 DIAGNOSIS — Z7189 Other specified counseling: Secondary | ICD-10-CM | POA: Diagnosis not present

## 2023-07-26 DIAGNOSIS — Z299 Encounter for prophylactic measures, unspecified: Secondary | ICD-10-CM | POA: Diagnosis not present

## 2023-07-26 DIAGNOSIS — Z1389 Encounter for screening for other disorder: Secondary | ICD-10-CM | POA: Diagnosis not present

## 2023-07-26 DIAGNOSIS — Z0000 Encounter for general adult medical examination without abnormal findings: Secondary | ICD-10-CM | POA: Diagnosis not present

## 2023-07-26 DIAGNOSIS — I10 Essential (primary) hypertension: Secondary | ICD-10-CM | POA: Diagnosis not present

## 2023-08-01 DIAGNOSIS — K859 Acute pancreatitis without necrosis or infection, unspecified: Secondary | ICD-10-CM | POA: Diagnosis not present

## 2023-08-01 DIAGNOSIS — Z8673 Personal history of transient ischemic attack (TIA), and cerebral infarction without residual deficits: Secondary | ICD-10-CM | POA: Diagnosis not present

## 2023-08-01 DIAGNOSIS — Z9049 Acquired absence of other specified parts of digestive tract: Secondary | ICD-10-CM | POA: Diagnosis not present

## 2023-08-01 DIAGNOSIS — K8689 Other specified diseases of pancreas: Secondary | ICD-10-CM | POA: Diagnosis not present

## 2023-08-01 DIAGNOSIS — R932 Abnormal findings on diagnostic imaging of liver and biliary tract: Secondary | ICD-10-CM | POA: Diagnosis not present

## 2023-08-01 DIAGNOSIS — Z885 Allergy status to narcotic agent status: Secondary | ICD-10-CM | POA: Diagnosis not present

## 2023-08-01 DIAGNOSIS — K861 Other chronic pancreatitis: Secondary | ICD-10-CM | POA: Diagnosis not present

## 2023-08-01 DIAGNOSIS — J45909 Unspecified asthma, uncomplicated: Secondary | ICD-10-CM | POA: Diagnosis not present

## 2023-08-01 DIAGNOSIS — Z79899 Other long term (current) drug therapy: Secondary | ICD-10-CM | POA: Diagnosis not present

## 2023-08-01 DIAGNOSIS — Z87891 Personal history of nicotine dependence: Secondary | ICD-10-CM | POA: Diagnosis not present

## 2023-08-01 DIAGNOSIS — I2510 Atherosclerotic heart disease of native coronary artery without angina pectoris: Secondary | ICD-10-CM | POA: Diagnosis not present

## 2023-08-01 DIAGNOSIS — I252 Old myocardial infarction: Secondary | ICD-10-CM | POA: Diagnosis not present

## 2023-08-01 DIAGNOSIS — Z880 Allergy status to penicillin: Secondary | ICD-10-CM | POA: Diagnosis not present

## 2023-08-01 DIAGNOSIS — R109 Unspecified abdominal pain: Secondary | ICD-10-CM | POA: Diagnosis not present

## 2023-08-09 ENCOUNTER — Other Ambulatory Visit (HOSPITAL_COMMUNITY)
Admission: RE | Admit: 2023-08-09 | Discharge: 2023-08-09 | Disposition: A | Discharge status: Home / Self Care | Source: Ambulatory Visit | Attending: Nurse Practitioner | Admitting: Nurse Practitioner

## 2023-08-09 DIAGNOSIS — I10 Essential (primary) hypertension: Secondary | ICD-10-CM | POA: Diagnosis not present

## 2023-08-09 DIAGNOSIS — K861 Other chronic pancreatitis: Secondary | ICD-10-CM | POA: Diagnosis not present

## 2023-08-09 DIAGNOSIS — R109 Unspecified abdominal pain: Secondary | ICD-10-CM | POA: Diagnosis not present

## 2023-08-09 DIAGNOSIS — Z299 Encounter for prophylactic measures, unspecified: Secondary | ICD-10-CM | POA: Diagnosis not present

## 2023-08-09 DIAGNOSIS — Z6823 Body mass index (BMI) 23.0-23.9, adult: Secondary | ICD-10-CM | POA: Diagnosis not present

## 2023-08-09 LAB — CBC WITH DIFFERENTIAL/PLATELET
Abs Immature Granulocytes: 0.01 10*3/uL (ref 0.00–0.07)
Basophils Absolute: 0 10*3/uL (ref 0.0–0.1)
Basophils Relative: 0 %
Eosinophils Absolute: 0.1 10*3/uL (ref 0.0–0.5)
Eosinophils Relative: 3 %
HCT: 42.9 % (ref 39.0–52.0)
Hemoglobin: 14.2 g/dL (ref 13.0–17.0)
Immature Granulocytes: 0 %
Lymphocytes Relative: 31 %
Lymphs Abs: 1.5 10*3/uL (ref 0.7–4.0)
MCH: 29 pg (ref 26.0–34.0)
MCHC: 33.1 g/dL (ref 30.0–36.0)
MCV: 87.7 fL (ref 80.0–100.0)
Monocytes Absolute: 0.5 10*3/uL (ref 0.1–1.0)
Monocytes Relative: 9 %
Neutro Abs: 2.7 10*3/uL (ref 1.7–7.7)
Neutrophils Relative %: 57 %
Platelets: 188 10*3/uL (ref 150–400)
RBC: 4.89 MIL/uL (ref 4.22–5.81)
RDW: 14 % (ref 11.5–15.5)
WBC: 4.8 10*3/uL (ref 4.0–10.5)
nRBC: 0 % (ref 0.0–0.2)

## 2023-08-09 LAB — COMPREHENSIVE METABOLIC PANEL WITH GFR
ALT: 14 U/L (ref 0–44)
AST: 10 U/L — ABNORMAL LOW (ref 15–41)
Albumin: 3.7 g/dL (ref 3.5–5.0)
Alkaline Phosphatase: 94 U/L (ref 38–126)
Anion gap: 11 (ref 5–15)
BUN: 13 mg/dL (ref 8–23)
CO2: 25 mmol/L (ref 22–32)
Calcium: 9.3 mg/dL (ref 8.9–10.3)
Chloride: 105 mmol/L (ref 98–111)
Creatinine, Ser: 0.71 mg/dL (ref 0.61–1.24)
GFR, Estimated: >60 mL/min (ref >60)
Glucose, Bld: 129 mg/dL — ABNORMAL HIGH (ref 70–99)
Potassium: 3.8 mmol/L (ref 3.5–5.1)
Sodium: 141 mmol/L (ref 135–145)
Total Bilirubin: 0.6 mg/dL (ref 0.0–1.2)
Total Protein: 7.3 g/dL (ref 6.5–8.1)

## 2023-08-22 DIAGNOSIS — E785 Hyperlipidemia, unspecified: Secondary | ICD-10-CM | POA: Diagnosis not present

## 2023-08-22 DIAGNOSIS — R001 Bradycardia, unspecified: Secondary | ICD-10-CM | POA: Diagnosis not present

## 2023-08-22 DIAGNOSIS — Z20822 Contact with and (suspected) exposure to covid-19: Secondary | ICD-10-CM | POA: Diagnosis not present

## 2023-08-22 DIAGNOSIS — Z87891 Personal history of nicotine dependence: Secondary | ICD-10-CM | POA: Diagnosis not present

## 2023-08-22 DIAGNOSIS — J45909 Unspecified asthma, uncomplicated: Secondary | ICD-10-CM | POA: Diagnosis not present

## 2023-08-22 DIAGNOSIS — K859 Acute pancreatitis without necrosis or infection, unspecified: Secondary | ICD-10-CM | POA: Diagnosis not present

## 2023-08-22 DIAGNOSIS — K219 Gastro-esophageal reflux disease without esophagitis: Secondary | ICD-10-CM | POA: Diagnosis not present

## 2023-08-22 DIAGNOSIS — Z8673 Personal history of transient ischemic attack (TIA), and cerebral infarction without residual deficits: Secondary | ICD-10-CM | POA: Diagnosis not present

## 2023-08-22 DIAGNOSIS — Z8719 Personal history of other diseases of the digestive system: Secondary | ICD-10-CM | POA: Diagnosis not present

## 2023-08-22 DIAGNOSIS — K861 Other chronic pancreatitis: Secondary | ICD-10-CM | POA: Diagnosis not present

## 2023-08-22 DIAGNOSIS — R1013 Epigastric pain: Secondary | ICD-10-CM | POA: Diagnosis not present

## 2023-08-22 DIAGNOSIS — K8689 Other specified diseases of pancreas: Secondary | ICD-10-CM | POA: Diagnosis not present

## 2023-08-22 DIAGNOSIS — Z9049 Acquired absence of other specified parts of digestive tract: Secondary | ICD-10-CM | POA: Diagnosis not present

## 2023-08-22 DIAGNOSIS — K838 Other specified diseases of biliary tract: Secondary | ICD-10-CM | POA: Diagnosis not present

## 2023-08-22 DIAGNOSIS — R112 Nausea with vomiting, unspecified: Secondary | ICD-10-CM | POA: Diagnosis not present

## 2023-08-22 DIAGNOSIS — I10 Essential (primary) hypertension: Secondary | ICD-10-CM | POA: Diagnosis not present

## 2023-08-22 DIAGNOSIS — Q453 Other congenital malformations of pancreas and pancreatic duct: Secondary | ICD-10-CM | POA: Diagnosis not present

## 2023-08-22 DIAGNOSIS — I252 Old myocardial infarction: Secondary | ICD-10-CM | POA: Diagnosis not present

## 2023-08-23 DIAGNOSIS — K8689 Other specified diseases of pancreas: Secondary | ICD-10-CM | POA: Diagnosis not present

## 2023-08-23 DIAGNOSIS — R1013 Epigastric pain: Secondary | ICD-10-CM | POA: Diagnosis not present

## 2023-08-23 DIAGNOSIS — K838 Other specified diseases of biliary tract: Secondary | ICD-10-CM | POA: Diagnosis not present

## 2023-08-23 NOTE — ED Provider Notes (Signed)
 [facility] EMERGENCY DEPT  ED Provider Note History   Chief Complaint  Patient presents with  . Abdominal Pain   History of Present Illness HPI Justin [name] is a 68yo male with history of asthma, chronic pancreatitis, necrotizing pancreatitis s/p necrosectomy in 06/2012, HLD, HTN, CVA, MI, presenting to ED for abdominal pain.  Patient reports 5 days of progressively worsening epigastric pain that is bandlike across his abdomen to his back.  He reports some nausea and vomiting as well.  No fever, chills, chest pain, shortness of breath, urinary or bowel symptoms.  He has been taking OxyContin  for pain control.  He states that he is hungry but eating makes the pain worse.  He went to his GI appointment yesterday and they sent him here for pain control and imaging.  He also reports a cough but is intermittent and states he has been dealing with this for a while now.   Per clinic note 6/11, Justin [name] is a 68 yo with known acute on chronic pancreatitis, previously well managed with pain that would wax and wanes in severity with fairly predictable triggers related to food intake.  Over the last year the pain has become more persistent and has escalated.  Imaging noted progressive duct dilation suggestive of a functional stricture contributing to symptoms with ERCP in an attempt to manage performed 08/01/2023.  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  Referral is recommended to Surgical Oncology to discussed intervention such as peustow.  This was reviewed with the patient and his wife, referral will be placed today.  But patient is having severe pain that continues to escalate significantly since the procedure.  I do recommend ED evaluation for symptom management and imaging to consider procedural complication.  We will make additional recommendations pending the results of our workup.  The patient will  contact us  in the interim with questions or concerns. Justin [name] and his wife participated in discussion, was given ample opportunities to ask questions and indicated understanding and agreeing with this plan. Past Medical History:  Diagnosis Date  . Asthma (HHS-HCC)   . Back pain, chronic   . Fibromyalgia   . GERD (gastroesophageal reflux disease)   . History of MI (myocardial infarction) 03/13/2006  . Hyperlipemia   . Hypertension   . Pancreatitis (HHS-HCC)    necrotizing  . Sore throat, unspecified    mild with URI symptoms/allergy   . Stroke (CMS/HHS-HCC) 03/14/2007  . Thyroid  disease   . Wears glasses    Past Surgical History:  Procedure Laterality Date  . ESOPHAGOGASTRODOUDENOSCOPY W/TRANSMURAL DRAINAGE PSEUDOCYST N/A 07/01/2012   Procedure: ZDNEYJHNHJDUMNINLIZWNDRNEB W/TRANSMURAL DRAINAGE PSEUDOCYST;  Surgeon: Asberry Jenkins Coffee, MD;  Location: [facility];  Service: Gastroenterology;  Laterality: N/A;  . ENDOSCOPIC ULTRASOUND  07/01/2012   Procedure: ENDOSCOPIC ULTRASOUND;  Surgeon: Asberry Jenkins Coffee, MD;  Location: [facility];  Service: Gastroenterology;;  . CORI W/TRANSENDOSCOPIC STENT PLACEMENT  07/01/2012   Procedure: ZDNEYJHNHJDUMNINLIZWNDRNEB W/TRANSENDOSCOPIC STENT PLACEMENT;  Surgeon: Asberry Jenkins Coffee, MD;  Location: [facility];  Service: Gastroenterology;;  . EGD N/A 07/10/2012   Procedure: EGD/cystgastrostomy ;  Surgeon: Asberry Jenkins Coffee, MD;  Location: [facility];  Service: Gastroenterology;  Laterality: N/A;  . ERCP N/A 03/24/2013   Procedure: ERCP;  Surgeon: Asberry Jenkins Coffee, MD;  Location: [facility];  Service: Gastroenterology;  Laterality: N/A;  . ENDOSCOPY OF BILIARY DUCT  03/24/2013  Procedure: ENDOSCOPY OF BILIARY DUCT;  Surgeon: Asberry Jenkins Coffee, MD;  Location: [facility];  Service: Gastroenterology;;  . PANCREATIC CYST EXCISION  N/A 05/30/2013   Procedure: LAPAROSCOPY, SURGICAL; EXCISION OF LESION OF PANCREAS (EG, CYST, ADENOMA) Possible Open ;  Surgeon: Marsa Doyal Burnet, MD;  Location: [facility];  Service: General Surgery;  Laterality: N/A;  . TOTAL SHOULDER REPLACEMENT Right 2024  . ERCP N/A 08/01/2023   Procedure: ERCP - Endoscopic Retrograde Cholangiopancreatography;  Surgeon: Burbridge, Asberry Jenkins, MD;  Location: [facility];  Service: Gastroenterology;  Laterality: N/A;  . AMPUTATION FINGER / THUMB Right    partial amputation right ring finger after dog attack  . BACK SURGERY    . CHOLECYSTECTOMY     Family History  Problem Relation Age of Onset  . Diabetes type II Mother   . Alcohol abuse Father   . Stomach cancer Father   . Alcohol abuse Paternal Uncle   . Alcohol abuse Paternal Grandfather   . Alcohol abuse Brother   . Brain cancer Brother   . Asthma Daughter   . Asthma Son   . Anesthesia problems Neg Hx   . Malignant hypertension Neg Hx   . Pseudochol deficiency Neg Hx    Social History   Socioeconomic History  . Marital status: Married  Tobacco Use  . Smoking status: Former    Current packs/day: 0.00    Average packs/day: 0.1 packs/day for 10.0 years (1.0 ttl pk-yrs)    Types: Cigarettes    Start date: 05/02/2003    Quit date: 05/01/2013    Years since quitting: 10.3  . Smokeless tobacco: Never  . Tobacco comments:    socially only.  ( smoke Marijuana  from time to time)   Vaping Use  . Vaping status: Never Used  Substance and Sexual Activity  . Alcohol use: No    Alcohol/week: 0.0 standard drinks of alcohol    Comment: Heavy drinker, sober since 2008  . Drug use: Yes    Frequency: 14.0 times per week    Types: Marijuana  . Sexual activity: Yes    Partners: Female   Social Drivers of Health   Food Insecurity: No Food Insecurity (06/15/2023)   [facility]   Hunger Vital Sign   . Within the past 12 months, you worried that your food would run  out before you got the money to buy more.: Never true   . Within the past 12 months, the food you bought just didn't last and you didn't have money to get more.: Never true  Transportation Needs: No Transportation Needs (06/15/2023)   Received from [facility] - Transportation   . Lack of Transportation (Medical): No   . Lack of Transportation (Non-Medical): No  Social Connections: Socially Integrated (06/15/2023)   Received from [facility]   Social Connection and Isolation Panel   . In a typical week, how many times do you talk on the phone with family, friends, or neighbors?: Three times a week   . How often do you get together with friends or relatives?: Twice a week   . How often do you attend church or religious services?: More than 4 times per year   . Do you belong to any clubs or organizations such as church groups, unions, fraternal or athletic groups, or school groups?: No   . How often do you attend meetings of the clubs or organizations you belong to?: 1 to 4 times  per year   . Are you married, widowed, divorced, separated, never married, or living with a partner?: Married  Housing Stability: Unknown (08/23/2023)   Housing Stability Vital Sign   . Homeless in the Last Year: No   Review of Systems  Constitutional:  Negative for chills and fever.  Respiratory:  Positive for cough. Negative for shortness of breath.   Cardiovascular:  Negative for chest pain.  Gastrointestinal:  Positive for abdominal pain, nausea and vomiting. Negative for blood in stool, constipation and diarrhea.  Genitourinary:  Negative for dysuria, frequency and hematuria.  Neurological:  Negative for syncope and light-headedness.    Physical Exam  BP 117/71 (BP Location: Right upper arm, Patient Position: Sitting)   Pulse 62   Temp 36.6 C (97.9 F) (Oral)   Resp 18   SpO2 100%  Physical Exam Vitals and nursing note reviewed.  Constitutional:      Appearance: He is well-developed.  HENT:      Head: Normocephalic and atraumatic.   Cardiovascular:     Rate and Rhythm: Normal rate and regular rhythm.     Pulses: Normal pulses.     Heart sounds: Normal heart sounds.  Pulmonary:     Effort: Pulmonary effort is normal.     Breath sounds: Normal breath sounds.  Abdominal:     Palpations: Abdomen is soft.     Tenderness: There is generalized abdominal tenderness. There is left CVA tenderness.     Comments: Generalized abdominal tenderness worse in the epigastric area.   Musculoskeletal:        General: Normal range of motion.     Right lower leg: No edema.     Left lower leg: No edema.   Skin:    General: Skin is warm and dry.   Neurological:     Mental Status: He is alert and oriented to person, place, and time.     Procedures  Procedures   Medical Decision Making   Assessment and Plan:     SECUNDINO ELLITHORPE is a 68yo male with history of asthma, chronic pancreatitis, necrotizing pancreatitis s/p necrosectomy in 06/2012, HLD, HTN, CVA, MI, presenting to ED for abdominal pain. Patient is in NAD and resting in recliner. VSS. Physical exam as noted above. Will obtain labs and imaging for further evaluation. Patient given fentanyl  and zofran  for symptom management. Patient dry heaving on eval.   EKG: Interpretation: sinus brady, normal axis, normal intervals, nonspecific T wave abnormalities, no ST segment elevation/depression. No significant change from EKG on 03/24/13.   Patient signed out to on-coming team at 1230 for continuation of care pending final recs from GI, GSU.   This is a face-to-face encounter in which all nursing notes, pertinent labs, imaging, past medical history, previous chart history, and clinical data have been independently reviewed, acted upon, and discussed with the patient and/or guardian.  Differential Diagnosis:    In addition to the differential diagnoses listed, other diagnoses were considered.      Consideration for acute on chronic  pancreatitis, complicated pancreatitis, gastritis, ACS, diverticulitis  Medical Complexity:    New and requires workup.     Pertinent labs & imaging results that were available during my care of the patient were reviewed by me and considered in my medical decision making.     I reviewed previous medical records.     I independently visualized image(s), tracing(s), and/or specimen(s).     I discussed the patient with another provider.  Dr. Marylin, GI and GSU teams.  Summary Statement:     Patient with history of chronic pancreatitis here from GI clinic for 5d of epigastric abdominal pain with associated NV. CT a/p here with c/f acute on chronic pancreatitis, increased dilatation of pancreatic duct, new intrahepatic biliary dilation. GI and GSU c/s. Patient signed out to on-coming team at 1230 for continuation of care pending recs and disposition.    ED Course as of 08/23/23 1134  Thu Aug 23, 2023  0930 Sodium: 140 [CC]  0930 Potassium: 4.0 [CC]  0930 Creatinine: 0.8 [CC]  0930 AST(!): 61 [CC]  0930 ALT: 39 [CC]  0930 Anion Gap: 9 [CC]  0930 WBC: 7.1 [CC]  0930 Hemoglobin: 14.1 [CC]  0930 Platelet Count /L: 224 [CC]  0930 Lipase: 35 [CC]  0930 Magnesium : 2.1 [CC]  0930 Lactate, Blood: 1.3 [CC]  1127 CT abdomen pelvis with contrast 1.  Findings overall suggesting acute on chronic pancreatitis with increased dilation of the pancreatic duct in the body/tail and perinephric stranding. 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 Follow-up CT recommended in 3 months. 2.  New mild intrahepatic biliary dilation, possibly reservoir effect status post cholecystectomy.  Will c/s GI and gen surg [CC]    ED Course User Index [CC] Roslynn Ruby [place], [place]      Medications Administered in the Emergency Department   lidocaine  (XYLOCAINE ) 1 % injection 0.5 mL (has no administration in time range) ondansetron   (ZOFRAN -ODT) disintegrating tablet 4 mg (4 mg Oral Given 08/23/23 0830) fentaNYL  (PF) (SUBLIMAZE ) injection 25 mcg (25 mcg Intravenous Given 08/23/23 0845)   ED Clinical Impression  1. Epigastric pain                Roslynn Ruby [place], [place] 08/24/23 [phone]

## 2023-08-23 NOTE — ED Provider Notes (Signed)
 Assumed care of patient at 12:30 PM from off-going team. For more details, please see note from same day.   Per prior ED provider, Justin [name] is a 68yo male with history of asthma, chronic pancreatitis, necrotizing pancreatitis s/p necrosectomy in 06/2012, HLD, HTN, CVA, MI, presenting to ED for abdominal pain.  Patient reports 5 days of progressively worsening epigastric pain that is bandlike across his abdomen to his back.  He reports some nausea and vomiting as well.  No fever, chills, chest pain, shortness of breath, urinary or bowel symptoms.  He has been taking OxyContin  for pain control.  He states that he is hungry but eating makes the pain worse.  He went to his GI appointment yesterday and they sent him here for pain control and imaging.  He also reports a cough but is intermittent and states he has been dealing with this for a while now.    Per clinic note 6/11, Justin [name] is a 68 yo with known acute on chronic pancreatitis, previously well managed with pain that would wax and wanes in severity with fairly predictable triggers related to food intake.  Over the last year the pain has become more persistent and has escalated.  Imaging noted progressive duct dilation suggestive of a functional stricture contributing to symptoms with ERCP in an attempt to manage performed 08/01/2023.  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  Referral is recommended to Surgical Oncology to discussed intervention such as peustow.  This was reviewed with the patient and his wife, referral will be placed today.  But patient is having severe pain that continues to escalate significantly since the procedure.  I do recommend ED evaluation for symptom management and imaging to consider procedural complication.  We will make additional recommendations pending the results of our workup.  The patient will contact us  in the  interim with questions or concerns. Justin [name] and his wife participated in discussion, was given ample opportunities to ask questions and indicated understanding and agreeing with this plan.  Plan/dispo at time of sign out:  []  GI recs []  General Surgery recs  ED Course since sign out: 2:45 PM - Patient noting that he has been on oxycodone  5 mg at home in addition to fentanyl  patches for pain which has been helping control his pain. Patient requesting new fentanyl  patch here which was applied for him and he was given his home dose of oxycodone . Patient wanting to go home. Discussed with patient admitting for further pain control and PO intolerance which patient declined. Patient able to drink water  here and have some food (apple sauce, crackers) without any issues. Patient comfortable with going home with additional short course of oxycodone  for his current pain flare and advised to follow up with his PCP for continued outpatient pain medication management as this is the primary provider who manages his pain medications. General Surgery and Gastroenterology okay with patient being discharged home with plan for patient to follow up outpatient in clinic on 6/17 as there is no indication for further endoscopic evaluation or emergent surgical intervention at this time. Patient otherwise stable for discharge home. All questions answered. Vital signs stable. Strict ED return precautions given.  Labs and other studies remarkable for: Labs: Labs Reviewed  COMPREHENSIVE METABOLIC PANEL (CMP) - Abnormal; Notable for the following components:      Result Value   AST (Aspartate Aminotransferase) 61 (*)  All other components within normal limits  LIPASE - Normal  MAGNESIUM  - Normal  LACTATE, PLASMA (LACTIC ACID) VENOUS - Normal  POC CORONAVIRUS (COVID-19) SARS-COV-2 RAPID TEST - Normal   Narrative:    POC TEST(S) ABOVE PERFORMED AT THE PATIENT CARE LOCATION AND OVERSEEN BY THE [facility] POCT  PROGRAM. Your COVID-19 coronavirus test was negative (i.e., the COVID-19 coronavirus was NOT detected in your sample). This means you are unlikely to be infected with the COVID-19 coronavirus unless you have recent exposures to a known COVID-19-positive person or new symptoms since the sample was collected. If your symptoms worsen, please call your provider's office during business hours, or the main [facility] COVID hotline at [phone] (weekdays 8am-5pm) and Employee Health COVID hotline (option 1) (weekdays 8am-5pm and weekends 8am-12pm). If you are experiencing a life-threatening emergency, please call 911.  COMPLETE BLOOD COUNT (CBC) WITH DIFFERENTIAL  CULTURE, URINE, ROUTINE WITH PYURIA SCREEN (REFLEXED FROM URINALYSIS)    Imaging: CT abdomen pelvis with contrast  Final Result      ED Course: ED Course as of 08/23/23 1227  Thu Aug 23, 2023  0930 Sodium: 140 [CC]  0930 Potassium: 4.0 [CC]  0930 Creatinine: 0.8 [CC]  0930 AST(!): 61 [CC]  0930 ALT: 39 [CC]  0930 Anion Gap: 9 [CC]  0930 WBC: 7.1 [CC]  0930 Hemoglobin: 14.1 [CC]  0930 Platelet Count /L: 224 [CC]  0930 Lipase: 35 [CC]  0930 Magnesium : 2.1 [CC]  0930 Lactate, Blood: 1.3 [CC]  1127 CT abdomen pelvis with contrast 1.  Findings overall suggesting acute on chronic pancreatitis with increased dilation of the pancreatic duct in the body/tail and perinephric stranding. 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 Follow-up CT recommended in 3 months. 2.  New mild intrahepatic biliary dilation, possibly reservoir effect status post cholecystectomy.  Will c/s GI and gen surg [CC]    ED Course User Index [CC] Roslynn Ruby [place], [place]    Interventions: Medications  lidocaine  (XYLOCAINE ) 1 % injection 0.5 mL (has no administration in time range)  ondansetron  (ZOFRAN -ODT) disintegrating tablet 4 mg (4 mg Oral Given 08/23/23 0830)  fentaNYL  (PF)  (SUBLIMAZE ) injection 25 mcg (25 mcg Intravenous Given 08/23/23 0845)  iopamidoL  (ISOVUE -300) 300 mg iodine  /mL (61 %) injection 150 mL (150 mLs Intravenous Given 08/23/23 0946)  fentaNYL  (PF) (SUBLIMAZE ) injection 25 mcg (25 mcg Intravenous Given 08/23/23 1156)     Dispo: DISCHARGED  Patient in stable condition without obvious distress or indication for additional imaging, treatment, consultation or admission.  Appears stable for discharge at this time. Discussed follow up with  PCP in 3-5 days. Significant findings were reviewed and explained. Discussed discharge instructions as well as return precautions. Patient verbalized understanding, denies any questions or concerns that have not been addressed and verbalizes intentions of compliance.   Darcy-Laine Malican, PA-C, MPH [facility]  Parts of this note may have been written using Dragon dictation. There may be spelling and/or grammatical errors and phrases that were mistakenly interpreted by the software and missed in my review.    Malican, Darcy Laine, [place] 08/26/23 1511

## 2023-08-23 NOTE — Consults (Signed)
 [facility] INPATIENT CONSULT NOTE  Primary Service Requesting Consult: Emergency Medicine Primary Service Provider Requesting Consultation: Justin Hussar, MD  Reason for Consult/Chief Complaint: PD dilation  HISTORY OF PRESENT ILLNESS:  Justin [name] is a 68 y.o. male with PMH asthma, GERD, MI, HLD, HTN, thyroid  disease, CVA, necrotizing pancreatitis s/p necrosectomy in 2014, severe pancreatic duct stricture c/b pancreatitis s/p unsuccessful prior ERCP in 2015 recently repeat attempt on 08/01/23 at which time they were unable to pass the guidewire to the distal pancreatic duct due to the presence of a stricture in the body of the pancreas presenting for worsening abdominal pain.  Justin [name] has a history of acute on chronic pancreatitis that he is seen in Biliary clinic for. He was in clinic with Justin [name] 6/11 with worsening severe abdominal pain since his ERCP on 5/21 and thus was recommended for evaluation in the ED.  His prior episodes of pancreatitis were thought to be related to alcohol use. He is s/p endoscopic necrosectomy in 06/2012 with resolution of the pseudocyst/necrotic area at that time. Please see Justin [name]' note from 08/22/23 for full history of his pancreatitis. Recent history notable fro ED presentation in 06/2023 with a week of progressive abdominal pain. ERCP was offered, attempted 08/01/2023.  At that time we were able to access the proximal pancreatic duct but unable to get to the distal duct to provide care. Recommended after this referral to surgery for possible peustow procedure. He has had unintentional weight loss (7 lbs) over the last couple of months due to difficulty maintaining PO intake. Today he really endorses chronic abdominal pain that has worsened since his ERCP but was present prior to this. He additionally does endorse intermittent hematochezia and dark stools that lasts for about a day at a time. Last bowel movement was yesterday and was  brown.  He has been afebrile and hemodynamically stable since arrival. Initial labs notable for Na 140, K 4.0, Ct 0.8, AST 61, ALT 39, tbili 1.2, alk phos 107, lipase 35, WBC 7.1, hgb 14.1, plts 224. Ct A/P showed new mild intrahepatic biliary dilation with CBD 0.9 cm as well as chronic pancreatitis with upstream dilation of PD up to 1.1 cm, previously 0.7 on 12/2022 imaging.   REVIEW OF SYSTEMS:  A 10-point ROS is negative except as noted in HPI.  HISTORY:   Past Medical History:  Diagnosis Date  . Asthma (HHS-HCC)   . Back pain, chronic   . Fibromyalgia   . GERD (gastroesophageal reflux disease)   . History of MI (myocardial infarction) 03/13/2006  . Hyperlipemia   . Hypertension   . Pancreatitis (HHS-HCC)    necrotizing  . Sore throat, unspecified    mild with URI symptoms/allergy   . Stroke (CMS/HHS-HCC) 03/14/2007  . Thyroid  disease   . Wears glasses     Past Surgical History:  Procedure Laterality Date  . ESOPHAGOGASTRODOUDENOSCOPY W/TRANSMURAL DRAINAGE PSEUDOCYST N/A 07/01/2012   Procedure: ZDNEYJHNHJDUMNINLIZWNDRNEB W/TRANSMURAL DRAINAGE PSEUDOCYST;  Surgeon: Asberry Jenkins Coffee, MD;  Location: [facility];  Service: Gastroenterology;  Laterality: N/A;  . ENDOSCOPIC ULTRASOUND  07/01/2012   Procedure: ENDOSCOPIC ULTRASOUND;  Surgeon: Asberry Jenkins Coffee, MD;  Location: [facility];  Service: Gastroenterology;;  . CORI W/TRANSENDOSCOPIC STENT PLACEMENT  07/01/2012   Procedure: ZDNEYJHNHJDUMNINLIZWNDRNEB W/TRANSENDOSCOPIC STENT PLACEMENT;  Surgeon: Asberry Jenkins Coffee, MD;  Location: [facility];  Service: Gastroenterology;;  . EGD N/A 07/10/2012   Procedure: EGD/cystgastrostomy ;  Surgeon: Asberry Jenkins Coffee, MD;  Location: [facility];  Service: Gastroenterology;  Laterality: N/A;  . ERCP N/A 03/24/2013   Procedure: ERCP;  Surgeon: Asberry Jenkins Coffee, MD;  Location: [facility];   Service: Gastroenterology;  Laterality: N/A;  . ENDOSCOPY OF BILIARY DUCT  03/24/2013   Procedure: ENDOSCOPY OF BILIARY DUCT;  Surgeon: Asberry Jenkins Coffee, MD;  Location: [facility];  Service: Gastroenterology;;  . PANCREATIC CYST EXCISION N/A 05/30/2013   Procedure: LAPAROSCOPY, SURGICAL; EXCISION OF LESION OF PANCREAS (EG, CYST, ADENOMA) Possible Open ;  Surgeon: Marsa Doyal Burnet, MD;  Location: [facility];  Service: General Surgery;  Laterality: N/A;  . TOTAL SHOULDER REPLACEMENT Right 2024  . ERCP N/A 08/01/2023   Procedure: ERCP - Endoscopic Retrograde Cholangiopancreatography;  Surgeon: Burbridge, Asberry Jenkins, MD;  Location: [facility];  Service: Gastroenterology;  Laterality: N/A;  . AMPUTATION FINGER / THUMB Right    partial amputation right ring finger after dog attack  . BACK SURGERY    . CHOLECYSTECTOMY     Family History  Problem Relation Age of Onset  . Diabetes type II Mother   . Alcohol abuse Father   . Stomach cancer Father   . Alcohol abuse Paternal Uncle   . Alcohol abuse Paternal Grandfather   . Alcohol abuse Brother   . Brain cancer Brother   . Asthma Daughter   . Asthma Son   . Anesthesia problems Neg Hx   . Malignant hypertension Neg Hx   . Pseudochol deficiency Neg Hx    Social History   Socioeconomic History  . Marital status: Married  Tobacco Use  . Smoking status: Former    Current packs/day: 0.00    Average packs/day: 0.1 packs/day for 10.0 years (1.0 ttl pk-yrs)    Types: Cigarettes    Start date: 05/02/2003    Quit date: 05/01/2013    Years since quitting: 10.3  . Smokeless tobacco: Never  . Tobacco comments:    socially only.  ( smoke Marijuana  from time to time)   Vaping Use  . Vaping status: Never Used  Substance and Sexual Activity  . Alcohol use: No    Alcohol/week: 0.0 standard drinks of alcohol    Comment: Heavy drinker, sober since 2008  . Drug use: Yes    Frequency: 14.0 times per week     Types: Marijuana  . Sexual activity: Yes    Partners: Female   Social Drivers of Health   Food Insecurity: No Food Insecurity (06/15/2023)   Received [facility]   Hunger Vital Sign   . Within the past 12 months, you worried that your food would run out before you got the money to buy more.: Never true   . Within the past 12 months, the food you bought just didn't last and you didn't have money to get more.: Never true  Transportation Needs: No Transportation Needs (06/15/2023)   Received [facility] - Transportation   . Lack of Transportation (Medical): No   . Lack of Transportation (Non-Medical): No  Social Connections: Socially Integrated (06/15/2023)   Received from [facility]   Social Connection and Isolation Panel   . In a typical week, how many times do you talk on the phone with family, friends, or neighbors?: Three times a week   . How often do you get together with friends or relatives?: Twice a week   . How often do you attend church or religious services?: More than 4 times per year   .  Do you belong to any clubs or organizations such as church groups, unions, fraternal or athletic groups, or school groups?: No   . How often do you attend meetings of the clubs or organizations you belong to?: 1 to 4 times per year   . Are you married, widowed, divorced, separated, never married, or living with a partner?: Married  Housing Stability: Unknown (08/23/2023)   Housing Stability Vital Sign   . Homeless in the Last Year: No   MEDICATIONS:  (Not in a hospital admission)   Scheduled Inpatient Medications:  . fentaNYL  (PF)  25 mcg Intravenous Once   PRN Inpatient Medications: lidocaine   Continuous Inpatient Infusions:   ALLERGIES:   Allergies  Allergen Reactions  . Amoxicillin Shortness Of Breath, Swelling and Rash  . Dilaudid  [Hydromorphone ] Shortness Of Breath, Swelling and Rash  . Morphine  Hives    Hives, sweating, twitching     PHYSICAL  EXAMINATION:  BP 117/71 (BP Location: Right upper arm, Patient Position: Sitting)   Pulse 62   Temp 36.6 C (97.9 F) (Oral)   Resp 18   SpO2 100%  General: in NAD HEENT: sclera anicteric, oropharynx clear, moist mucous membranes Cardiovascular: regular rate and rhythm, no murmurs Respiratory: clear to auscultation bilaterally Abdomen: soft, nondistended, mild epigastric TTP Extremities: no LE edema Skin: no rashes or lesions Neurologic: alert and conversant  DATA:  Labs: Recent Labs  Lab 08/23/23 0140  WBC 7.1  HGB 14.1  HCT 43.2  PLT 224   Recent Labs  Lab 08/23/23 0140  NA 140  K 4.0  CL 104  CO2 27  BUN 7  CREATININE 0.8  GLUCOSE 90  CALCIUM  9.0  TOTALPROTEIN 7.2  ALB 4.0   Recent Labs  Lab 08/23/23 0140  AST 61*  ALT 39  ALKPHOS 107  TBILI 1.2    No results for input(s): APTT, INR in the last 168 hours.  Imaging:  CT A/P with contrast 08/23/23: Impression: 1.  Findings overall suggesting acute on chronic pancreatitis with increased dilation of the pancreatic duct in the body/tail and perinephric stranding. 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 Follow-up CT recommended in 3 months. 2.  New mild intrahepatic biliary dilation, possibly reservoir effect status post cholecystectomy.  Endoscopic Procedures:  ERCP 08/01/23: - The major papilla appeared normal. - Despite switching to a smaller caliber guidewire, we were unable to gain guidewire access the distal pancreatic duct due to the presence of a severe stricture in the body of the pancreas (no contrast filling of the pancreas tail). - Normal limited cholangiogram.  ASSESSMENT:  Justin [name] is a 68 y.o. male with PMH asthma, GERD, MI, HLD, HTN, thyroid  disease, CVA, necrotizing pancreatitis s/p necrosectomy in 2014, severe pancreatic duct stricture c/b pancreatitis s/p unsuccessful prior ERCP in 2015  recently repeat attempt on 08/01/23 at which time they were unable to pass the guidewire to the distal pancreatic duct due to the presence of a stricture in the body of the pancreas presenting for worsening abdominal pain.  At this point in time, no evidence of post-procedural complication from 5/21 like perforation. He likely has pain in the setting of presumed flare of chronic pancreatitis likely from ERCP on 5/21. He is on chronic opioids in the outpatient setting and unfortunately pain has continued to get progressively worse. Given unable to transverse PD stricture on ERCP, he will need surgical evaluation for consideration of peustow for drainage. We are  grateful to Dr. Zani for getting him into clinic so quickly on 6/17. There are no further endoscopic interventions that we can provide. His acute ED presentation was due to pain so the real consideration is whether he requires admission for pain management or if he is able to tolerate this pain outside of the hospital. We appreciate care with his ED team to further discern his disposition. We have discussed the case with his General Surgery team and we understand no immediate plans for surgery and feel it is appropriate to see Dr. Zani in clinic on 6/17.  Separately from the question of abdominal pain, he does endorse a history of intermittent hematochezia that lasts for a day or so then resolves which has occurred over the last year. This is resolved at the current moment and hgb stable. Cologuard in 2023 was normal. Recommend colonoscopy (cannot find records of his last colonoscopy) once his chronic pancreatitis has improved.   Recommendations: - Appreciate pain control for chronic pancreatitis flare per primary team - Appreciate consideration of surgical intervention per surgical team; scheduled to see Dr. Zani in clinic 6/17 - No role for further endoscopic evaluation - Recommend colonoscopy when recovers from intervention for his chronic  pancreatitis. He can arrange this with PCP at follow up once recovers.    This plan was discussed with the supervising GI attending physician: Dr. Jama. Please see the attending attestation/separate note for additional information and any updated recommendations.  Thank you for this consult. We will continue to follow should he be admitted.   Justin EARNIE EDINGER, MD Gastroenterology Fellow  If you have any questions or concerns, please contact the covering GI fellow at the functional pager listed below: Biliary Consult Pager: [phone]   ------------------------------------------------------------------------------- Attestation with edits by [name] Justin Ruse, MD at 08/23/2023  8:39 PM GASTROENTEROLOGY ATTENDING NOTE   67 yo with chronic pancreatitis c/b PD stenosis who presented with abdominal pain. Recently underwent ERCP 08/01/23 with inability to access distal pancreatic duct due to severe stenosis in body of pancreas. Referred to [facility] surgery and has upcoming appointment for consideration of surgical management.  CT on presentation with PD dilatation and non-specific mild CBD dilatation. LFTs normal as is his CBC. Recommend supportive care at this time and follow up in surgery clinic for consideration of surgical management.   Of note, notes intermittent hematochezia/dark stools which last for ~1 day over at least the past year. Wife states he has history of hemorrhoids. Hemoglobin is stable with negative cologuard in 2023. Unclear timing of last colonoscopy. Discussed with patient and wife that would recommend colonoscopy when recovers from his acute illness with pancreatitis any any potential pancreatic surgery and can be arranged after that time.   ATTESTATION   (FR)  I personally saw and evaluated the patient, and participated in the management and treatment plan as documented in the resident/fellow note.   Justin RUSE JAMA, MD Attending Physician Division of  Gastroenterology 08/23/2023 at: 8:26 PM General GI Consult Pager: [phone] Biliary Consult Pager: [phone]  -------------------------------------------------------------------------------

## 2023-08-28 DIAGNOSIS — K8689 Other specified diseases of pancreas: Secondary | ICD-10-CM | POA: Diagnosis not present

## 2023-08-28 NOTE — Progress Notes (Signed)
 General Surgery New Patient Visit   Date of Visit: 08/28/2023   Referring MD/Care Team:  Allison Rocky Caldron, PA [address] [place],  [place] [address] Patient Care Team: Rosamond Leta NOVAK, MD as PCP - General (Internal Medicine) Bonner Charlie Carmine, MD (Orthopedic Surgery)   Diagnosis:    ICD-10-CM   1. Alcohol-induced chronic pancreatitis (CMS/HHS-HCC)  K86.0     2. Stricture of pancreatic duct (HHS-HCC)  K86.89         HPI: Justin [name] is a 68 y.o. male with a h/o asthma, GERD, fibromyalgia, MI in '08, CVA in '09 who presents for surgical evaluation of pancreatic duct stricture. Justin [name] initially presented with acute pancreatitis in 2014 following heavy drinking. He underwent endoscopic necrosectomy followed by open 2015 (Dr. Francella). He did well until 2022 when he began to have symptoms following Methotrexate initiation. He has since had multiple ED visits and admissions for acute on chronic pancreatitis. He recently presented in April of this year with pancreatitis. ERCP noted a severe stricture in the body that was unable to be cannulated.   Patient notes that he has had left-sided and epigastric pain for many years but it has recently worsened. He lost 20 lbs in the past 2 months and is currently having severe pain. He has a fentanyl  patch as well as oxycodone  PRN. He has not been able to eat much and primarily drinks vegetable smoothies.   Upon ROS, he denies fever, chills, jaundice, change in bowel or bladder habits.  Allergies: Allergies  Allergen Reactions  . Amoxicillin Shortness Of Breath, Swelling and Rash  . Dilaudid  [Hydromorphone ] Shortness Of Breath, Swelling and Rash  . Methotrexate Other (See Comments)    Pt's spouse stated this drug made the pt have pancreatitis  . Morphine  Hives    Hives, sweating, twitching  . Diclofenac  Other (See Comments)  . Lisinopril Other (See Comments)    Current Medications:  Current Outpatient Medications:  .   albuterol  90 mcg/actuation inhaler, 2 inhalations every 4 (four) hours as needed, Disp: , Rfl:  .  aspirin  81 MG chewable tablet, Take 81 mg by mouth daily., Disp: , Rfl:  .  cholecalciferol (VITAMIN D3) 1000 unit capsule, Take 1,000 Units by mouth, Disp: , Rfl:  .  clonazePAM  (KLONOPIN ) 0.5 MG tablet, Take 0.5 mg by mouth 2 (two) times daily., Disp: , Rfl:  .  DULoxetine  (CYMBALTA ) 60 MG DR capsule, 60 mg every morning. , Disp: , Rfl:  .  gabapentin  (NEURONTIN ) 600 MG tablet, Take 600 mg by mouth 3 (three) times daily. , Disp: , Rfl:  .  levothyroxine  (SYNTHROID , LEVOTHROID) 150 MCG tablet, Take 150 mcg by mouth every morning. , Disp: , Rfl:  .  lidocaine  (LIDODERM ) 5 % patch, Place 2 patches onto the skin as needed., Disp: 60 patch, Rfl: 0 .  lipase-protease-amylase (CREON ) 36,000-114,000-180,000 unit DR capsule, 2-3 per meal, 1-2 per snack, Disp: 500 capsule, Rfl: 11 .  losartan (COZAAR) 25 MG tablet, Take 25 mg by mouth at bedtime, Disp: , Rfl:  .  MULTIVITAMIN ORAL, Take by mouth every morning. , Disp: , Rfl:  .  naproxen (NAPROSYN) 500 MG tablet, Take 500 mg by mouth 2 (two) times daily, Disp: , Rfl:  .  nitroGLYcerin  (NITROSTAT ) 0.4 MG SL tablet, Place 0.4 mg under the tongue every 5 (five) minutes as needed for Chest pain May take up to 3 doses., Disp: , Rfl:  .  omeprazole (PRILOSEC) 40  MG DR capsule, Take 40 mg by mouth once daily, Disp: , Rfl:  .  oxyBUTYnin  (DITROPAN -XL) 10 MG XL tablet, Take 10 mg by mouth once daily, Disp: , Rfl:  .  predniSONE  (DELTASONE ) 10 MG tablet, Take 10 mg by mouth 2 (two) times daily with meals, Disp: , Rfl:  .  rosuvastatin (CRESTOR) 5 MG tablet, 1 tablet Orally Once a day for 30 day(s), Disp: , Rfl:  .  sucralfate  (CARAFATE ) 1 gram tablet, Take 1 g by mouth, Disp: , Rfl:  .  tamsulosin  (FLOMAX ) 0.4 mg capsule, Take by mouth. : Take 0.4 mg by mouth 2 (two) times daily, Disp: , Rfl:  .  tiZANidine  (ZANAFLEX ) 4 MG tablet, Take 4 mg by mouth 3 (three) times  daily. , Disp: , Rfl:  .  vit C/zinc  citrate/elderberry (SAMBUCUS ELDERBERRY ORAL), Take by mouth every morning, Disp: , Rfl:  .  vitamin B complex (B COMPLEX 1 ORAL), Take by mouth, Disp: , Rfl:  .  baclofen  (LIORESAL ) 10 MG tablet, Take by mouth (Patient not taking: Reported on 06/22/2021), Disp: , Rfl:  .  fenofibrate  160 MG tablet, Take 105 mg by mouth (Patient not taking: Reported on 07/18/2023), Disp: , Rfl:  .  fexofenadine (ALLEGRA) 180 MG tablet, Take 180 mg by mouth once daily. (Patient not taking: Reported on 07/18/2023), Disp: , Rfl:  .  ondansetron  (ZOFRAN ) 4 MG tablet, 4 mg every 8 (eight) hours as needed (Patient not taking: Reported on 07/18/2023), Disp: , Rfl:  .  pantoprazole  (PROTONIX ) 40 MG DR tablet, Take by mouth (Patient not taking: Reported on 07/18/2023), Disp: , Rfl:  .  polyethylene glycol (MIRALAX ) packet, Take 1 packet (17 g total) by mouth 2 (two) times daily. Mix in 4-8ounces of fluid prior to taking. (Patient not taking: Reported on 08/28/2023), Disp: 72 each, Rfl: 1 .  traMADoL (ULTRAM) 50 mg tablet, Take 1 tablet by mouth every 6 (six) hours (Patient not taking: Reported on 07/18/2023), Disp: , Rfl:   Past Medical History: Past Medical History:  Diagnosis Date  . Asthma (HHS-HCC)   . Back pain, chronic   . Fibromyalgia   . GERD (gastroesophageal reflux disease)   . History of MI (myocardial infarction) 03/13/2006  . Hyperlipemia   . Hypertension   . Pancreatitis (HHS-HCC)    necrotizing  . Sore throat, unspecified    mild with URI symptoms/allergy   . Stroke (CMS/HHS-HCC) 03/14/2007  . Thyroid  disease   . Wears glasses     Past Surgical History: Past Surgical History:  Procedure Laterality Date  . ESOPHAGOGASTRODOUDENOSCOPY W/TRANSMURAL DRAINAGE PSEUDOCYST N/A 07/01/2012   Procedure: ZDNEYJHNHJDUMNINLIZWNDRNEB W/TRANSMURAL DRAINAGE PSEUDOCYST;  Surgeon: Justin Jenkins Coffee, MD;  Location: [facility];  Service: Gastroenterology;  Laterality: N/A;   . ENDOSCOPIC ULTRASOUND  07/01/2012   Procedure: ENDOSCOPIC ULTRASOUND;  Surgeon: Justin Jenkins Coffee, MD;  Location: [facility];  Service: Gastroenterology;;  . CORI W/TRANSENDOSCOPIC STENT PLACEMENT  07/01/2012   Procedure: ZDNEYJHNHJDUMNINLIZWNDRNEB W/TRANSENDOSCOPIC STENT PLACEMENT;  Surgeon: Justin Jenkins Coffee, MD;  [facility]: [facility];  Service: Gastroenterology;;  . EGD N/A 07/10/2012   Procedure: EGD/cystgastrostomy ;  Surgeon: Justin Jenkins Coffee, MD;  Location: [facility];  Service: Gastroenterology;  Laterality: N/A;  . ERCP N/A 03/24/2013   Procedure: ERCP;  Surgeon: Justin Jenkins Coffee, MD;  [facility]: [facility];  Service: Gastroenterology;  Laterality: N/A;  . ENDOSCOPY OF BILIARY DUCT  03/24/2013   Procedure: ENDOSCOPY OF BILIARY DUCT;  Surgeon: Justin Jenkins  Queenie, MD;  Location: [facility];  Service: Gastroenterology;;  . PANCREATIC CYST EXCISION N/A 05/30/2013   Procedure: LAPAROSCOPY, SURGICAL; EXCISION OF LESION OF PANCREAS (EG, CYST, ADENOMA) Possible Open ;  Surgeon: Marsa Doyal Burnet, MD;  Location: [facility];  Service: General Surgery;  Laterality: N/A;  . TOTAL SHOULDER REPLACEMENT Right 2024  . ERCP N/A 08/01/2023   Procedure: ERCP - Endoscopic Retrograde Cholangiopancreatography;  Surgeon: Burbridge, Justin Caldron, MD;  Location: [facility];  Service: Gastroenterology;  Laterality: N/A;  . AMPUTATION FINGER / THUMB Right    partial amputation right ring finger after dog attack  . BACK SURGERY    . CHOLECYSTECTOMY      Social History: Social History   Socioeconomic History  . Marital status: Married  Tobacco Use  . Smoking status: Former    Current packs/day: 0.00    Average packs/day: 0.1 packs/day for 10.0 years (1.0 ttl pk-yrs)    Types: Cigarettes    Start date: 05/02/2003    Quit date: 05/01/2013    Years since quitting: 10.3  . Smokeless  tobacco: Never  . Tobacco comments:    socially only.  ( smoke Marijuana  from time to time)   Vaping Use  . Vaping status: Never Used  Substance and Sexual Activity  . Alcohol use: No    Alcohol/week: 0.0 standard drinks of alcohol    Comment: Heavy drinker, sober since 2008  . Drug use: Yes    Frequency: 14.0 times per week    Types: Marijuana  . Sexual activity: Yes    Partners: Female   Social Drivers of Health   Food Insecurity: No Food Insecurity (06/15/2023)   Received from [facility]   Hunger Vital Sign   . Within the past 12 months, you worried that your food would run out before you got the money to buy more.: Never true   . Within the past 12 months, the food you bought just didn't last and you didn't have money to get more.: Never true  Transportation Needs: No Transportation Needs (06/15/2023)   Received from [facility] - Transportation   . Lack of Transportation (Medical): No   . Lack of Transportation (Non-Medical): No  Social Connections: Socially Integrated (06/15/2023)   Received from [facility]   Social Connection and Isolation Panel   . In a typical week, how many times do you talk on the phone with family, friends, or neighbors?: Three times a week   . How often do you get together with friends or relatives?: Twice a week   . How often do you attend church or religious services?: More than 4 times per year   . Do you belong to any clubs or organizations such as church groups, unions, fraternal or athletic groups, or school groups?: No   . How often do you attend meetings of the clubs or organizations you belong to?: 1 to 4 times per year   . Are you married, widowed, divorced, separated, never married, or living with a partner?: Married  Housing Stability: Unknown (08/23/2023)   Housing Stability Vital Sign   . Homeless in the Last Year: No    Family History: Family History  Problem Relation Age of Onset  . Diabetes type II Mother   . Alcohol  abuse Father   . Stomach cancer Father   . Alcohol abuse Paternal Uncle   . Alcohol abuse Paternal Grandfather   . Alcohol abuse Brother   .  Brain cancer Brother   . Asthma Daughter   . Asthma Son   . Anesthesia problems Neg Hx   . Malignant hypertension Neg Hx   . Pseudochol deficiency Neg Hx      Review of Systems:  Complete ROS was done, all pertinent/positives/negatives listed in HPI, all other systems negative.   Physical Exam: BP 118/74 (BP Location: Left upper arm, Patient Position: Sitting, BP Cuff Size: Adult)   Pulse 56   Temp 36.7 C (98 F) (Oral)   Resp 17   Ht 180.3 cm (5' 10.98)   Wt 67.1 kg (147 lb 14.9 oz)   SpO2 98%   BMI 20.64 kg/m  General appearance: alert, appears stated age, and cooperative Lungs: normal work of breathing Abdomen: soft, nondistended, tender to palpation in epigastric region and left upper, left lower quadrants  Skin: Skin color, texture, turgor normal. No rashes or lesions Neurologic: Grossly normal Incisions: midline incision healed, hernia present   Imaging/Studies:  Preliminary personal review of imaging/studies below by APP and MD No images are attached to the encounter.   Labs: Results for orders placed or performed during the hospital encounter of 08/22/23  CT abdomen pelvis with contrast   Narrative   CT abdomen and pelvis with IV contrast  Comparison:  MRI abdomen 12/14/2022, reference only CT abdomen pelvis 12/12/2015.  Indication:  Pancreatitis, acute, severe, acute on chronic pancreatitis, R10.13 Epigastric pain.  Technique:  CT imaging was performed of the abdomen and pelvis following the administration of intravenous contrast.  Iodinated contrast was used due to the indications for the examination, to improve disease detection and further define anatomy. Coronal and sagittal reformatted images were generated and reviewed.  Findings:  - Lower Thorax: No suspicious pulmonary abnormalities. No pleural  or pericardial effusions.  - Liver: Normal in morphology and enhancement.  No suspicious hepatic masses are identified.  The portal and hepatic veins are patent.   - Biliary and Gallbladder: New mild intrahepatic biliary dilation. Prominence of the CBD measuring up to 0.9 cm in diameter. The gallbladder is surgically absent.  - Spleen: Normal in appearance.    - Pancreas: Redemonstrated sequela of prior pancreatitis with focal atrophy in the region of the pancreatic neck with tethering/stranding of the adjacent structures. There is increased upstream dilation of the main pancreatic duct now measuring 1.1 cm in diameter (series 3 image 35), previously 0.7 cm on MRI dated 12/14/2022. There is mild fat stranding about the pancreatic body and tail. No loculated fluid collections.   - Adrenal Glands: Normal in appearance.   - Kidneys: Symmetric in size and enhancement. No suspicious renal lesions. 0.3 cm nonobstructing calculus in the left inferior pole. Punctate nonobstructing calculus in the right inferior pole. No hydronephrosis.  - Abdominal and Pelvic Vasculature: No abdominal aortic aneurysm. Sequela of chronic SMV thrombosis with numerous adjacent prominent venous collaterals. Tethering of the splenic vein origin to the region of the pancreatic neck, however the vein remains patent.  - Gastrointestinal Tract: No abnormal dilation or wall thickening. Colonic diverticulosis. Normal appendix.  - Peritoneum/Mesentery/Retroperitoneum: No free fluid.  No free intraperitoneal air.  - Lymph Nodes: No retroperitoneal, mesenteric, pelvic, or inguinal lymphadenopathy.    - Bladder: Normal in appearance.  - Pelvic Organs: Unremarkable.  - Body Wall: Diastasis recti with fat-containing hernias and a component containing short segment of small bowel without evidence of obstruction.  - Musculoskeletal:  No aggressive appearing osseous lesions. Multilevel degenerative changes of the  spine.   Impression: 1.  Findings overall suggesting acute on chronic pancreatitis with increased dilation of the pancreatic duct in the body/tail and perinephric stranding. 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 Follow-up CT recommended in 3 months. 2.  New mild intrahepatic biliary dilation, possibly reservoir effect status post cholecystectomy.  Electronically Reviewed by:  Cordella Forts, MD, [facility] Electronically Reviewed on:  08/23/2023 11:22 AM  I have reviewed the images and concur with the above findings.  Electronically Signed by:  Randine Dunnings, MD, [facility] Electronically Signed on:  08/23/2023 11:45 AM  Lipase  Result Value Ref Range   Lipase 35 17 - 51 U/L  Comprehensive Metabolic Panel (CMP)  Result Value Ref Range   Sodium 140 135 - 145 mmol/L   Potassium 4.0 3.5 - 5.0 mmol/L   Chloride 104 98 - 108 mmol/L   Carbon Dioxide (CO2) 27 21 - 30 mmol/L   Urea Nitrogen (BUN) 7 7 - 20 mg/dL   Creatinine 0.8 0.6 - 1.3 mg/dL   Glucose 90 70 - 859 mg/dL   Calcium  9.0 8.7 - 10.2 mg/dL   AST (Aspartate Aminotransferase) 61 (H) 15 - 41 U/L   ALT (Alanine Aminotransferase) 39 15 - 50 U/L   Bilirubin, Total 1.2 0.4 - 1.5 mg/dL   Alk Phos (Alkaline Phosphatase) 107 24 - 110 U/L   Albumin 4.0 3.5 - 4.8 g/dL   Protein, Total 7.2 6.2 - 8.1 g/dL   Anion Gap 9 3 - 12 mmol/L   BUN/CREA Ratio 9 6 - 27   Glomerular Filtration Rate (eGFR)  96 mL/min/1.73sq m  Complete Blood Count (CBC) with Differential  Result Value Ref Range   WBC (White Blood Cell Count) 7.1 3.2 - 9.8 x10^9/L   Hemoglobin 14.1 13.7 - 17.3 g/dL   Hematocrit 56.7 60.9 - 49.0 %   Platelets 224 150 - 450 x10^9/L   MCV (Mean Corpuscular Volume) 87 80 - 98 fL   MCH (Mean Corpuscular Hemoglobin) 28.3 26.5 - 34.0 pg   MCHC (Mean Corpuscular Hemoglobin Concentration) 32.6 31.5 - 36.3 %   RBC (Red Blood Cell Count) 4.98 4.37  - 5.74 x10^12/L   RDW-CV (Red Cell Distribution Width) 14.2 11.5 - 14.5 %   NRBC (Nucleated Red Blood Cell Count) 0.00 0 x10^9/L   NRBC % (Nucleated Red Blood Cell %) 0.0 %   MPV (Mean Platelet Volume) 11.3 7.2 - 11.7 fL   Neutrophil Count 5.0 2.0 - 8.6 x10^9/L   Neutrophil % 70.9 37 - 80 %   Lymphocyte Count 1.4 0.6 - 4.2 x10^9/L   Lymphocyte % 19.4 10 - 50 %   Monocyte Count 0.5 0 - 0.9 x10^9/L   Monocyte % 7.6 0 - 12 %   Eosinophil Count 0.10 0 - 0.70 x10^9/L   Eosinophil % 1.4 0 - 7 %   Basophil Count 0.01 0 - 0.20 x10^9/L   Basophil % 0.1 0 - 2 %   Immature Granulocyte Count 0.04 <=0.06 x10^9/L   Immature Granulocyte % 0.6 <=0.7 %  Magnesium   Result Value Ref Range   Magnesium  2.1 1.8 - 2.5 mg/dL  Lactate, Plasma (Lactic Acid) Venous  Result Value Ref Range   Lactate, Blood 1.3 0.6 - 2.2 mmol/L  Culture, Urine, Routine with Pyuria Screen (reflexed from Urinalysis)  Result Value Ref Range   See Comment     Color Yellow Colorless, Straw, Light Yellow, Yellow, Dark Yellow   Clarity Clear Clear  Specific Gravity <=1.005 1.005 - 1.030   pH, Urine 6.5 5.0 - 8.0   Protein, Urinalysis Negative Negative   Glucose, Urinalysis Negative Negative   Ketones, Urinalysis 1+ (!) Negative   Blood, Urinalysis Negative Negative   Nitrite, Urinalysis Negative Negative   Leukocytes, Urinalysis Negative Negative   Bilirubin, Urinalysis Negative Negative   Urobilinogen, Urinalysis 0.2 0.2 - 1.0 mg/dL   Narrative   Reporting of microscopic urinalysis (UA) bacteria and yeast results was discontinued on August 30th, 2021 due to poor clinical predictive value for urinary tract infection (UTI). Providers should use other clinical signs and symptoms to diagnose UTI including urine leukocyte esterase, white blood cell count, and culture in accordance with local and national guidelines. Additional information and rationale can obtained by emailing [email or web address].  Consult to General  Surgery- ED Only   Narrative   Porras Fimbres, Ardie File, MD     08/23/2023 12:36 PM  General Surgery Consult Note     Date of Consult: 08/23/2023 Consulting Department:  ED Reason for Consult: Acute on chronic pancreatitis with new  increased dilation of pancreatitic duct, body tail with new  intrahepatic biliary dilation            History of Present Illness   Justin [name] is a 68 y.o. 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 Patient was sent from GI clinic for progressively  worsening epigastric pain radiating to back for symptom  management and to evaluate for a possible complication from ERCP.  He then underwent CT which revealed increased dilation of the  pancreatic duct in the body/tail and perinephrinc stranding as  well as new intrahepatic biliary dilation for which General  Surgery is being consulted.  Patient reports he has always struggled with abdominal pain due  to his annular pancreas/pancreas divisum. In the past two months  however he has experienced worsening abdominal pain starting at  his epigastric area and radiating to his back as well as nausea  and vomiting since his recent ERCP in May. Mentions poor po  intake due to worsening pain after eating. Has noticed weight  loss and intermittent bloody bowel movements; no clots but  describes poop as dark with some bright red stranding. No fevers  or chills at home. Has been taking oxycodone  for pain. On arrival   to the [facility] ED, patient was afebrile and HDS, no leukocytosis.  Of note, he has a new patient appointment with Surgical Oncology  on 08/28/23.  Additional History   Past Medical History: Past Medical History:  Diagnosis Date   Asthma (HHS-HCC)    Back pain, chronic    Fibromyalgia    GERD (gastroesophageal reflux disease)    History of MI (myocardial infarction) 03/13/2006   Hyperlipemia    Hypertension    Pancreatitis (HHS-HCC)    necrotizing   Sore throat, unspecified    mild with URI symptoms/allergy    Stroke (CMS/HHS-HCC) 03/14/2007   Thyroid  disease    Wears glasses    Past Surgical History:  Past Surgical History:  Procedure Laterality Date   ESOPHAGOGASTRODOUDENOSCOPY W/TRANSMURAL DRAINAGE PSEUDOCYST N/A  07/01/2012   Procedure: ZDNEYJHNHJDUMNINLIZWNDRNEB W/TRANSMURAL DRAINAGE  PSEUDOCYST;  Surgeon: Justin Jenkins Coffee, MD;  [facility]: [facility];  Service: Gastroenterology;  Laterality: N/A;   ENDOSCOPIC ULTRASOUND  07/01/2012   Procedure: ENDOSCOPIC ULTRASOUND;  Surgeon: Justin Jenkins Coffee, MD;  [facility]: [facility];  Service:  Gastroenterology;;   CORI W/TRANSENDOSCOPIC STENT PLACEMENT   07/01/2012   Procedure: ZDNEYJHNHJDUMNINLIZWNDRNEB W/TRANSENDOSCOPIC STENT  PLACEMENT;  Surgeon: Justin Jenkins Coffee, MD;  [facility]: [facility];  Service: Gastroenterology;;   EGD N/A 07/10/2012   Procedure: EGD/cystgastrostomy ;  Surgeon: Justin Jenkins Coffee, MD;  [facility]: [facility];  Service:  Gastroenterology;  Laterality: N/A;   ERCP N/A 03/24/2013   Procedure: ERCP;  Surgeon: Justin Jenkins Coffee, MD;  Location:  [facility];  Service: Gastroenterology;  Laterality:  N/A;   ENDOSCOPY OF BILIARY DUCT  03/24/2013   Procedure: ENDOSCOPY OF BILIARY DUCT;  Surgeon: Justin Jenkins Coffee, MD;  [facility]: [facility];  Service:  Gastroenterology;;   PANCREATIC CYST  EXCISION N/A 05/30/2013   Procedure: LAPAROSCOPY, SURGICAL; EXCISION OF LESION OF PANCREAS  (EG, CYST, ADENOMA) Possible Open ;  Surgeon: Marsa Doyal Burnet, MD;  Location: [facility];  Service: General  Surgery;  Laterality: N/A;   TOTAL SHOULDER REPLACEMENT Right 2024   ERCP N/A 08/01/2023   Procedure: ERCP - Endoscopic Retrograde  Cholangiopancreatography;  Surgeon: Burbridge, Justin Jenkins, MD;   Location: [facility];  Service: Gastroenterology;   Laterality: N/A;   AMPUTATION FINGER / THUMB Right    partial amputation right ring finger after dog attack   BACK SURGERY     CHOLECYSTECTOMY     Current Medications: Current Facility-Administered Medications  Medication Dose Route Frequency Provider Last Rate Last Admin   lidocaine  (XYLOCAINE ) 1 % injection 0.5 mL  0.5 mL Subcutaneous  As Directed Marylin Hussar, MD       Current Outpatient Medications  Medication Sig Dispense Refill   albuterol  90 mcg/actuation inhaler 2 inhalations every 4 (four)  hours as needed     aspirin  81 MG chewable tablet Take 81 mg by mouth daily.     baclofen  (LIORESAL ) 10 MG tablet Take by mouth (Patient not  taking: Reported on 08/22/2023)     cholecalciferol (VITAMIN D3) 1000 unit capsule Take 1,000 Units  by mouth     clonazePAM  (KLONOPIN ) 0.5 MG tablet Take 0.5 mg by mouth 2 (two)  times daily.     DULoxetine  (CYMBALTA ) 60 MG DR capsule 60 mg every morning.      fenofibrate  160 MG tablet Take 105 mg by mouth (Patient not  taking: Reported on 08/22/2023)     fexofenadine (ALLEGRA) 180 MG tablet Take 180 mg by mouth once  daily. (Patient not taking: Reported on 08/22/2023)     gabapentin  (NEURONTIN ) 600 MG tablet Take 600 mg by mouth 3  (three) times daily.      levothyroxine  (SYNTHROID , LEVOTHROID) 150 MCG tablet Take 150  mcg by mouth every morning.      lidocaine  (LIDODERM ) 5 % patch Place 2 patches onto the skin as  needed. 60 patch 0   lipase-protease-amylase (CREON )  36,000-114,000-180,000 unit DR  capsule 2-3 per meal, 1-2 per snack 500 capsule 11   losartan (COZAAR) 25 MG tablet Take 25 mg by mouth at bedtime     MULTIVITAMIN ORAL Take by mouth every morning.  naproxen (NAPROSYN) 500 MG tablet Take 500 mg by mouth 2 (two)  times daily     nitroGLYcerin  (NITROSTAT ) 0.4 MG SL tablet Place 0.4 mg under  the tongue every 5 (five) minutes as needed for Chest pain May  take up to 3 doses.     omeprazole (PRILOSEC) 40 MG DR capsule Take 40 mg by mouth once  daily     ondansetron  (ZOFRAN ) 4 MG tablet 4 mg every 8 (eight) hours as  needed (Patient not taking: Reported on 08/22/2023)     oxyBUTYnin  (DITROPAN -XL) 10 MG XL tablet Take 10 mg by mouth  once daily     oxyCODONE  (ROXICODONE ) 5 MG immediate release tablet Take by  mouth once daily as needed     pantoprazole  (PROTONIX ) 40 MG DR tablet Take by mouth (Patient  not taking: Reported on 08/22/2023)     polyethylene glycol (MIRALAX ) packet Take 1 packet (17 g total)  by mouth 2 (two) times daily. Mix in 4-8ounces of fluid prior to  taking. (Patient taking differently: Take 17 g by mouth once  daily as needed Mix in 4-8ounces of fluid prior to taking.) 72  each 1   predniSONE  (DELTASONE ) 10 MG tablet Take 10 mg by mouth 2 (two)  times daily with meals     rosuvastatin (CRESTOR) 5 MG tablet 1 tablet Orally Once a day  for 30 day(s)     sucralfate  (CARAFATE ) 1 gram tablet Take 1 g by mouth     tamsulosin  (FLOMAX ) 0.4 mg capsule Take by mouth. : Take 0.4 mg  by mouth 2 (two) times daily     tiZANidine  (ZANAFLEX ) 4 MG tablet Take 4 mg by mouth 3 (three)  times daily.      traMADoL (ULTRAM) 50 mg tablet Take 1 tablet by mouth every 6  (six) hours (Patient not taking: Reported on 08/22/2023)     vit C/zinc  citrate/elderberry (SAMBUCUS ELDERBERRY ORAL) Take by  mouth every morning     vitamin B complex (B COMPLEX 1 ORAL) Take by mouth     Allergies: Allergies  Allergen Reactions   Amoxicillin  Shortness Of Breath, Swelling and Rash   Dilaudid  [Hydromorphone ] Shortness Of Breath, Swelling and Rash   Morphine  Hives    Hives, sweating, twitching   Social History: Social History   Socioeconomic History   Marital status: Married  Tobacco Use   Smoking status: Former    Current packs/day: 0.00    Average packs/day: 0.1 packs/day for 10.0 years (1.0 ttl  pk-yrs)    Types: Cigarettes    Start date: 05/02/2003    Quit date: 05/01/2013    Years since quitting: 10.3   Smokeless tobacco: Never   Tobacco comments:    socially only.  ( smoke Marijuana  from time to time)   Vaping Use   Vaping status: Never Used  Substance and Sexual Activity   Alcohol use: No    Alcohol/week: 0.0 standard drinks of alcohol    Comment: Heavy drinker, sober since 2008   Drug use: Yes    Frequency: 14.0 times per week    Types: Marijuana   Sexual activity: Yes    Partners: Female   Social Drivers of Health   Food Insecurity: No Food Insecurity (06/15/2023)   [facility]   Hunger Vital Sign    Within the past 12 months, you worried that your food would run  out before you got the money to buy more.: Never true  Within the past 12 months, the food you bought just didn't last  and you didn't have money to get more.: Never true  Transportation Needs: No Transportation Needs (06/15/2023)   Received from [facility] - Transportation    Lack of Transportation (Medical): No    Lack of Transportation (Non-Medical): No   Family History: Family History  Problem Relation Name Age of Onset   Diabetes type II Mother     Alcohol abuse Father     Stomach cancer Father     Alcohol abuse Paternal Uncle     Alcohol abuse Paternal Grandfather     Alcohol abuse Brother     Brain cancer Brother     Asthma Daughter     Asthma Son     Anesthesia problems Neg Hx     Malignant hypertension Neg Hx     Pseudochol deficiency Neg Hx     Review of Systems: 10+ review of systems was  reviewed. Pertinent positives and  negatives are listed above.  Vitals and Physical Exam   Vitals: BP 117/71 (BP Location: Right upper arm, Patient Position:  Sitting)   Pulse 62   Temp 36.6 C (97.9 F) (Oral)   Resp 18    SpO2 100%   Height:   Last Weight:   BMI: There is no height or weight on file to calculate BMI.  General: Alert, cooperative, in NAD Cardiovascular: HDS Respiratory: Nonlabored breathing on RA Abdomen: Soft, diffuse tenderness to palpation notably in  epigastric and mid-right abdomen, well healed midline scar with  some underlying scar tissue  Labs and Imaging   Labs:  Recent Labs  Lab 08/23/23 0140 08/23/23 0845  NA 140  --   K 4.0  --   CL 104  --   CO2 27  --   BUN 7  --   CREATININE 0.8  --   GLUCOSE 90  --   MG  --  2.1    Recent Labs  Lab 08/23/23 0140  WBC 7.1  HGB 14.1  HCT 43.2  PLT 224    Recent Labs  Lab 08/23/23 0140  AST 61*  ALT 39  TBILI 1.2  ALKPHOS 107  ALB 4.0   No results for input(s): INR, APTT in the last 168 hours.    Pertinent Radiology:  CT AP 08/23/23: Impression: 1.  Findings overall suggesting acute on chronic pancreatitis  with increased dilation of the pancreatic duct in the body/tail and  perinephric stranding. Redemonstrated focal atrophy and pancreatic duct  stricture in the pancreatic neck and sequela of chronic SMV  occlusion. Follow-up CT recommended in 3 months. 2.  New mild intrahepatic biliary dilation, possibly reservoir  effect status post cholecystectomy.  MRCP 12/13/22: Impression: Sequelae of chronic pancreatitis with pancreatic ductal dilation  up to 7 mm, slightly increased from prior. No definite obstructing mass  or stone is seen, though EUS/ERCP could be considered for further  evaluation.  CT AP 04/06/14: Impression:  1. Remaining cystic lesion in the neck of the pancreas.  Comparison was made with a prior study dated 08/01/2013. This cystic lesion is smaller   compared to prior. 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 The intrahepatic portions of the portal vein and the main  portal vein are patent. On several images the SMV is extremely diminutive and  it is possible that there is a small clot in this location. It is also  possible that the low  density lesion is due to volume averaging of fat.  Assessment and Plan   Justin [name] is a 68 y.o. 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 Patient was sent from GI clinic for progressively  worsening epigastric pain radiating to back for symptom  management and to evaluate for a possible complication from ERCP.  He then underwent CT which revealed increased dilation of the  pancreatic duct in the body/tail and perinephrinc stranding as  well as new intrahepatic biliary dilation for which General  Surgery is being consulted. At this time, patient is afebrile and  HDS without leukocytosis however with persistent abdominal pain.  No emergent surgical intervention warranted as of now. Will  follow GI's plan given they were also consulted.  Recommendations: -No emergent surgical intervention -Recommend symptom management -Surgical Oncology will f/u with inpatient GI team re dispo -Keep outpatient first patient appointment on 6/17  Ardie Ros, MD Plastic and Reconstructive Surgery, PGY2 08/23/2023  Surgery Consult Contacts: ED Consults/Trauma: [phone] Inpatient  General Surgery: [phone] Vascular Surgery: 029-0002 Transplant Surgery: 029-0003 Pediatric Surgery: [phone] Colorectal Surgery: 029-0007 Surgical Oncology [facility]): 1 (Zani/Pappas): [phone] / 2  (Allen/Lidsky): [phone] / 3 (Blazer et. Al): [id number] Acute Care Surgery Primary Team: [phone] Trauma Primary Team: [phone]  Consult to Gastroenterology- Biliary   Narrative   Jama Fonda Ruse, MD     08/23/2023  8:39 PM    [facility] INPATIENT CONSULT [facility]  Primary Service Requesting Consult: Emergency Medicine Primary Service Provider Requesting Consultation: Marylin Hussar, MD  Reason for Consult/Chief Complaint: PD dilation  HISTORY OF PRESENT ILLNESS:  Justin [name] is a 68 y.o. male with PMH asthma, GERD, MI,  HLD, HTN, thyroid  disease, CVA, necrotizing pancreatitis s/p  necrosectomy in 2014, severe pancreatic duct stricture c/b  pancreatitis s/p unsuccessful prior ERCP in 2015 recently repeat  attempt on 08/01/23 at which time they were unable to pass the  guidewire to the distal pancreatic duct due to the presence of a  stricture in the body of the pancreas presenting for worsening  abdominal pain.  Mr. Albor has a history of acute on chronic pancreatitis that  he is seen in Biliary clinic for. He was in clinic with Rocky Forster 6/11 with worsening severe abdominal pain since his ERCP  on 5/21 and thus was recommended for evaluation in the ED.  His prior episodes of pancreatitis were thought to be related to  alcohol use. He is s/p endoscopic necrosectomy in 06/2012 with  resolution of the pseudocyst/necrotic area at that time. Please  see Rocky Forster' note from 08/22/23 for full history of his  pancreatitis. Recent history notable fro ED presentation in  06/2023 with a week of progressive abdominal pain. ERCP was  offered, attempted 08/01/2023.  At that time we were able to  access the proximal pancreatic duct but unable to get to the  distal duct to  provide care. Recommended after this referral to  surgery for possible peustow procedure. He has had unintentional  weight loss (  7 lbs) over the last couple of months due to  difficulty maintaining PO intake. Today he really endorses  chronic abdominal pain that has worsened since his ERCP but was  present prior to this. He additionally does endorse intermittent  hematochezia and dark stools that lasts for about a day at a  time. Last bowel movement was yesterday and was brown.  He has been afebrile and hemodynamically stable since arrival.  Initial labs notable for Na 140, K 4.0, Ct 0.8, AST 61, ALT 39,  tbili 1.2, alk phos 107, lipase 35, WBC 7.1, hgb 14.1, plts 224.  Ct A/P showed new mild intrahepatic biliary dilation with CBD 0.9  cm as well as chronic pancreatitis with upstream dilation of PD  up to 1.1 cm, previously 0.7 on 12/2022 imaging.   REVIEW OF SYSTEMS:  A 10-point ROS is negative except as noted in HPI.  HISTORY:   Past Medical History:  Diagnosis Date   Asthma (HHS-HCC)    Back pain, chronic    Fibromyalgia    GERD (gastroesophageal reflux disease)    History of MI (myocardial infarction) 03/13/2006   Hyperlipemia    Hypertension    Pancreatitis (HHS-HCC)    necrotizing   Sore throat, unspecified    mild with URI symptoms/allergy    Stroke (CMS/HHS-HCC) 03/14/2007   Thyroid  disease    Wears glasses     Past Surgical History:  Procedure Laterality Date   ESOPHAGOGASTRODOUDENOSCOPY W/TRANSMURAL DRAINAGE PSEUDOCYST N/A  07/01/2012   Procedure: ZDNEYJHNHJDUMNINLIZWNDRNEB W/TRANSMURAL DRAINAGE  PSEUDOCYST;  Surgeon: Justin Jenkins Coffee, MD;  [facility]: [facility];  Service: Gastroenterology;  Laterality: N/A;   ENDOSCOPIC ULTRASOUND  07/01/2012   Procedure: ENDOSCOPIC ULTRASOUND;  Surgeon: Justin Jenkins Coffee, MD;  [facility]: [facility];  Service:  Gastroenterology;;   CORI W/TRANSENDOSCOPIC STENT PLACEMENT    07/01/2012   Procedure: ZDNEYJHNHJDUMNINLIZWNDRNEB W/TRANSENDOSCOPIC STENT  PLACEMENT;  Surgeon: Justin Jenkins Coffee, MD;  [facility]: [facility];  Service: Gastroenterology;;   EGD N/A 07/10/2012   Procedure: EGD/cystgastrostomy ;  Surgeon: Justin Jenkins Coffee, MD;  [facility]: [facility];  Service:  Gastroenterology;  Laterality: N/A;   ERCP N/A 03/24/2013   Procedure: ERCP;  Surgeon: Justin Jenkins Coffee, MD;  Location:  [facility];  Service: Gastroenterology;  Laterality:  N/A;   ENDOSCOPY OF BILIARY DUCT  03/24/2013   Procedure: ENDOSCOPY OF BILIARY DUCT;  Surgeon: Justin Jenkins Coffee, MD;  [facility]: [facility];  Service:  Gastroenterology;;   PANCREATIC CYST EXCISION N/A 05/30/2013   Procedure: LAPAROSCOPY, SURGICAL; EXCISION OF LESION OF PANCREAS  (EG, CYST, ADENOMA) Possible Open ;  Surgeon: Marsa Doyal Burnet, MD;  Location: [facility];  Service: General  Surgery;  Laterality: N/A;   TOTAL SHOULDER REPLACEMENT Right 2024   ERCP N/A 08/01/2023   Procedure: ERCP - Endoscopic Retrograde  Cholangiopancreatography;  Surgeon: Burbridge, Justin Jenkins, MD;   Location: [facility];  Service: Gastroenterology;   Laterality: N/A;   AMPUTATION FINGER / THUMB Right    partial amputation right ring finger after dog attack   BACK SURGERY     CHOLECYSTECTOMY     Family History  Problem Relation Age of Onset   Diabetes type II Mother    Alcohol abuse Father    Stomach cancer Father    Alcohol abuse Paternal Uncle    Alcohol abuse Paternal Grandfather    Alcohol abuse Brother    Brain cancer Brother  Asthma Daughter    Asthma Son    Anesthesia problems Neg Hx    Malignant hypertension Neg Hx    Pseudochol deficiency Neg Hx    Social History   Socioeconomic History   Marital status: Married  Tobacco Use   Smoking status: Former    Current packs/day: 0.00    Average packs/day: 0.1 packs/day for  10.0 years (1.0 ttl  pk-yrs)    Types: Cigarettes    Start date: 05/02/2003    Quit date: 05/01/2013    Years since quitting: 10.3   Smokeless tobacco: Never   Tobacco comments:    socially only.  ( smoke Marijuana  from time to time)   Vaping Use   Vaping status: Never Used  Substance and Sexual Activity   Alcohol use: No    Alcohol/week: 0.0 standard drinks of alcohol    Comment: Heavy drinker, sober since 2008   Drug use: Yes    Frequency: 14.0 times per week    Types: Marijuana   Sexual activity: Yes    Partners: Female   Social Drivers of Health   Food Insecurity: No Food Insecurity (06/15/2023)   Received from [facility]   Hunger Vital Sign    Within the past 12 months, you worried that your food would run  out before you got the money to buy more.: Never true    Within the past 12 months, the food you bought just didn't last  and you didn't have money to get more.: Never true  Transportation Needs: No Transportation Needs (06/15/2023)   Received from [facility] - Transportation    Lack of Transportation (Medical): No    Lack of Transportation (Non-Medical): No  Social Connections: Socially Integrated (06/15/2023)   Received from [facility]   Social Connection and Isolation Panel    In a typical week, how many times do you talk on the phone with  family, friends, or neighbors?: Three times a week    How often do you get together with friends or relatives?: Twice  a week    How often do you attend church or religious services?: More  than 4 times per year    Do you belong to any clubs or organizations such as church  groups, unions, fraternal or athletic groups, or school groups?:  No    How often do you attend meetings of the clubs or organizations  you belong to?: 1 to 4 times per year    Are you married, widowed, divorced, separated, never married,  or living with a partner?: Married  Housing Stability: Unknown (08/23/2023)   Housing Stability Vital  Sign    Homeless in the Last Year: No   MEDICATIONS:  (Not in a hospital admission)   Scheduled Inpatient Medications:   fentaNYL  (PF)  25 mcg Intravenous Once   PRN Inpatient Medications: lidocaine   Continuous Inpatient Infusions:   ALLERGIES:   Allergies  Allergen Reactions   Amoxicillin Shortness Of Breath, Swelling and Rash   Dilaudid  [Hydromorphone ] Shortness Of Breath, Swelling and Rash   Morphine  Hives    Hives, sweating, twitching     PHYSICAL EXAMINATION:  BP 117/71 (BP Location: Right upper arm, Patient Position:  Sitting)   Pulse 62   Temp 36.6 C (97.9 F) (Oral)   Resp 18    SpO2 100%  General: in NAD HEENT: sclera anicteric, oropharynx clear, moist mucous membranes Cardiovascular: regular rate and rhythm, no murmurs Respiratory: clear to auscultation bilaterally  Abdomen: soft, nondistended, mild epigastric TTP Extremities: no LE edema Skin: no rashes or lesions Neurologic: alert and conversant  DATA:  Labs: Recent Labs  Lab 08/23/23 0140  WBC 7.1  HGB 14.1  HCT 43.2  PLT 224   Recent Labs  Lab 08/23/23 0140  NA 140  K 4.0  CL 104  CO2 27  BUN 7  CREATININE 0.8  GLUCOSE 90  CALCIUM  9.0  TOTALPROTEIN 7.2  ALB 4.0   Recent Labs  Lab 08/23/23 0140  AST 61*  ALT 39  ALKPHOS 107  TBILI 1.2    No results for input(s): APTT, INR in the last 168 hours.  Imaging:  CT A/P with contrast 08/23/23: Impression: 1.  Findings overall suggesting acute on chronic pancreatitis  with increased dilation of the pancreatic duct in the body/tail and  perinephric stranding. Redemonstrated focal atrophy and pancreatic duct  stricture in the pancreatic neck and sequela of chronic SMV occlusion.  Follow-up CT recommended in 3 months. 2.  New mild intrahepatic biliary dilation, possibly reservoir  effect status post cholecystectomy.  Endoscopic Procedures:  ERCP 08/01/23: - The major papilla appeared normal. - Despite switching to a  smaller caliber guidewire, we were  unable to gain guidewire access the distal pancreatic duct due to the presence of a severe stricture in the  body of the pancreas (no contrast filling of the pancreas tail). - Normal limited cholangiogram.  ASSESSMENT:  Justin [name] is a 68 y.o. male with PMH asthma, GERD, MI,  HLD, HTN, thyroid  disease, CVA, necrotizing pancreatitis s/p  necrosectomy in 2014, severe pancreatic duct stricture c/b  pancreatitis s/p unsuccessful prior ERCP in 2015 recently repeat  attempt on 08/01/23 at which time they were unable to pass the  guidewire to the distal pancreatic duct due to the presence of a  stricture in the body of the pancreas presenting for worsening  abdominal pain.  At this point in time, no evidence of post-procedural  complication from 5/21 like perforation. He likely has pain in  the setting of presumed flare of chronic pancreatitis likely from  ERCP on 5/21. He is on chronic opioids in the outpatient setting  and unfortunately pain has continued to get progressively worse.  Given unable to transverse PD stricture on ERCP, he will need  surgical evaluation for consideration of peustow for drainage. We  are grateful to Dr. Zani for getting him into clinic so quickly  on 6/17. There are no further endoscopic interventions that we  can provide. His acute ED presentation was due to pain so the  real consideration is whether he requires admission for pain  management or if he is able to tolerate this pain outside of the  hospital. We appreciate care with his ED team to further discern  his disposition. We have discussed the case with his General  Surgery team and we understand no immediate plans for surgery and  feel it is appropriate to see Dr. Zani in clinic on 6/17.  Separately from the question of abdominal pain, he does endorse a  history of intermittent hematochezia that lasts for a day or so  then resolves which has occurred over the  last year. This is  resolved at the current moment and hgb stable. Cologuard in 2023  was normal. Recommend colonoscopy (cannot find records of his  last colonoscopy) once his chronic pancreatitis has improved.   Recommendations: - Appreciate pain control for chronic pancreatitis flare per  primary team - Appreciate consideration  of surgical intervention per surgical  team; scheduled to see Dr. Zani in clinic 6/17 - No role for further endoscopic evaluation - Recommend colonoscopy when recovers from intervention for his  chronic pancreatitis. He can arrange this with PCP at follow up  once recovers.    This plan was discussed with the supervising GI attending  physician: Dr. Jama. Please see the attending attestation/separate  note for additional information and any updated recommendations.  Thank you for this consult. We will continue to follow should he  be admitted.   SACHIKO EARNIE EDINGER, MD Gastroenterology Fellow  If you have any questions or concerns, please contact the  covering GI fellow at the functional pager listed below: Biliary Consult Pager: [phone]   ECG 12-lead  Result Value Ref Range   Vent Rate (bpm) 57    PR Interval (msec) 166    QRS Interval (msec) 90    QT Interval (msec) 436    QTc (msec) 424    Narrative   Sinus bradycardia Otherwise normal ECG  When compared with ECG of 24-Mar-2013 10:19, No significant change was found I reviewed and concur with this report. Electronically signed ab:[email or web address], MD, NEIL ([id number]) on 08/23/2023 9:17:43 PM  POC Coronavirus (COVID-19) SARS-Cov-2 Rapid Test  Result Value Ref Range   POC Coronavirus (COVID-19) SARS-CoV-2 Rapid Test Not Detected Not Detected   Narrative   POC TEST(S) ABOVE PERFORMED AT THE PATIENT CARE LOCATION AND OVERSEEN BY THE [facility] POCT PROGRAM. Your COVID-19 coronavirus test was negative (i.e., the COVID-19 coronavirus was NOT detected in your sample). This means you are unlikely to be infected with  the COVID-19 coronavirus unless you have recent exposures to a known COVID-19-positive person or new symptoms since the sample was collected. If your symptoms worsen, please call your provider's office during business hours, or the main [facility] COVID hotline at [phone] (weekdays 8am-5pm) and Employee Health COVID hotline (option 1) (weekdays 8am-5pm and weekends 8am-12pm). If you are experiencing a life-threatening emergency, please call 911.      Assessment:    ICD-10-CM   1. Alcohol-induced chronic pancreatitis (CMS/HHS-HCC)  K86.0     2. Stricture of pancreatic duct (HHS-HCC)  K86.89        Plan:  We have reviewed imaging, labs, and procedures as described above.   Diagnoses and all orders for this visit:  Alcohol-induced chronic pancreatitis (CMS/HHS-HCC) Stricture of pancreatic duct (HHS-HCC) Patient has chronic pancreatitis with a dilated main pancreatic duct (8.7 cm) along with severe pain. He is a candidate for Puestow procedure (robotic pancreaticojejunostomy). - Schedule OR date   He and his family are in agreement with this plan of care. We appreciate the opportunity to participate in his care.   Student Documentation Attestation: I saw Justin JAYSON Lore in the presence of supervising attending physician, Sabino Zani.  WENDELYN CURLY, Med Student    Attestation Statement:   I personally performed the service. (TP)  Sabino Zani Jr., MD  I spent a total of 55 minutes in both face-to-face and non-face-to-face activities, excluding procedures performed, for this visit on the date of this encounter.

## 2023-08-30 DIAGNOSIS — Z299 Encounter for prophylactic measures, unspecified: Secondary | ICD-10-CM | POA: Diagnosis not present

## 2023-08-30 DIAGNOSIS — K219 Gastro-esophageal reflux disease without esophagitis: Secondary | ICD-10-CM | POA: Diagnosis not present

## 2023-08-30 DIAGNOSIS — E1165 Type 2 diabetes mellitus with hyperglycemia: Secondary | ICD-10-CM | POA: Diagnosis not present

## 2023-08-30 DIAGNOSIS — I10 Essential (primary) hypertension: Secondary | ICD-10-CM | POA: Diagnosis not present

## 2023-08-30 DIAGNOSIS — K861 Other chronic pancreatitis: Secondary | ICD-10-CM | POA: Diagnosis not present

## 2023-08-30 DIAGNOSIS — E1140 Type 2 diabetes mellitus with diabetic neuropathy, unspecified: Secondary | ICD-10-CM | POA: Diagnosis not present

## 2023-09-26 DIAGNOSIS — K863 Pseudocyst of pancreas: Secondary | ICD-10-CM | POA: Diagnosis not present

## 2023-09-26 DIAGNOSIS — K862 Cyst of pancreas: Secondary | ICD-10-CM | POA: Diagnosis not present

## 2023-09-26 DIAGNOSIS — Z01818 Encounter for other preprocedural examination: Secondary | ICD-10-CM | POA: Diagnosis not present

## 2023-09-26 NOTE — Unmapped External Note (Signed)
 Medication instructions prior to procedure:     Medication Sig INSTRUCTIONS  . albuterol  90 mcg/actuation inhaler 2 inhalations every 4 (four) hours as needed TAKE day of procedure, if needed  . aspirin  81 MG chewable tablet Take 81 mg by mouth daily. TAKE day of procedure  . clonazePAM  (KLONOPIN ) 0.5 MG tablet Take 0.5 mg by mouth 2 (two) times daily. TAKE day of procedure  . DULoxetine  (CYMBALTA ) 60 MG DR capsule 60 mg every morning.  TAKE day of procedure  . gabapentin  (NEURONTIN ) 600 MG tablet Take 600 mg by mouth 3 (three) times daily.  TAKE day of procedure  . levothyroxine  (SYNTHROID , LEVOTHROID) 150 MCG tablet Take 150 mcg by mouth every morning.  TAKE day of procedure  . lipase-protease-amylase (CREON ) 36,000-114,000-180,000 unit DR capsule 2-3 per meal, 1-2 per snack DO NOT TAKE day of procedure  . MULTIVITAMIN ORAL Take by mouth every morning.  STOP taking 7 days prior to procedure  . omeprazole (PRILOSEC) 40 MG DR capsule Take 40 mg by mouth once daily TAKE day of procedure  . ondansetron  (ZOFRAN ) 4 MG tablet 4 mg every 8 (eight) hours as needed TAKE day of procedure, if needed  . oxyBUTYnin  (DITROPAN -XL) 10 MG XL tablet Take 10 mg by mouth once daily TAKE day of procedure  . rosuvastatin (CRESTOR) 5 MG tablet Take 5 mg by mouth at bedtime TAKE night before procedure  . sennosides (SENOKOT) 8.6 mg tablet Take 1 tablet by mouth 2 (two) times daily DO NOT TAKE day of procedure  . tamsulosin  (FLOMAX ) 0.4 mg capsule Take by mouth. : Take 0.4 mg by mouth 2 (two) times daily TAKE day of procedure  . tiZANidine  (ZANAFLEX ) 4 MG tablet Take 4 mg by mouth 3 (three) times daily.  TAKE day of procedure  . vit C/zinc  citrate/elderberry (SAMBUCUS ELDERBERRY ORAL) Take by mouth every morning DO NOT TAKE day of procedure   NOTHING TO EAT OR DRINK (except water , black tea, or black coffee) AFTER MIDNIGHT THE NIGHT BEFORE THE PROCEDURE.  Please take the highlighted medication(s), with a glass of  water  on the day of the procedure up to 1 hour prior to check in time.  NO herbal supplements/multivitamins for 7 days before procedure.    Please avoid taking NSAIDs (such as ibuprofen, Advil, Motrin, Naproxen) one week prior to procedure.   Tylenol  is okay to take as needed for pain up until the morning of your procedure; do not exceed > 3 grams in 24 hours.  Please use an antibacterial/antimicrobial soap, such as Dial, to shower the night before procedure and the day of procedure.  No ointments/creams/lotions/deodorant on the day of procedure.   Please feel free to call our clinic [phone]) if you have any questions.

## 2023-10-04 ENCOUNTER — Emergency Department (HOSPITAL_COMMUNITY)
Admission: EM | Admit: 2023-10-04 | Discharge: 2023-10-05 | Disposition: A | Discharge status: Other Acute Inpatient Hospital | Attending: Emergency Medicine | Admitting: Emergency Medicine

## 2023-10-04 ENCOUNTER — Encounter (HOSPITAL_COMMUNITY): Payer: Self-pay

## 2023-10-04 ENCOUNTER — Other Ambulatory Visit: Payer: Self-pay

## 2023-10-04 ENCOUNTER — Emergency Department (HOSPITAL_COMMUNITY)

## 2023-10-04 DIAGNOSIS — Z7982 Long term (current) use of aspirin: Secondary | ICD-10-CM | POA: Insufficient documentation

## 2023-10-04 DIAGNOSIS — K859 Acute pancreatitis without necrosis or infection, unspecified: Secondary | ICD-10-CM | POA: Diagnosis not present

## 2023-10-04 DIAGNOSIS — R1084 Generalized abdominal pain: Secondary | ICD-10-CM | POA: Diagnosis present

## 2023-10-04 DIAGNOSIS — K5730 Diverticulosis of large intestine without perforation or abscess without bleeding: Secondary | ICD-10-CM | POA: Diagnosis not present

## 2023-10-04 DIAGNOSIS — N200 Calculus of kidney: Secondary | ICD-10-CM | POA: Diagnosis not present

## 2023-10-04 DIAGNOSIS — K449 Diaphragmatic hernia without obstruction or gangrene: Secondary | ICD-10-CM | POA: Diagnosis not present

## 2023-10-04 DIAGNOSIS — K8591 Acute pancreatitis with uninfected necrosis, unspecified: Secondary | ICD-10-CM | POA: Diagnosis not present

## 2023-10-04 DIAGNOSIS — R1013 Epigastric pain: Secondary | ICD-10-CM | POA: Diagnosis not present

## 2023-10-04 LAB — LIPASE, BLOOD: Lipase: 44 U/L (ref 11–51)

## 2023-10-04 LAB — CBC WITH DIFFERENTIAL/PLATELET
Abs Immature Granulocytes: 0.02 K/uL (ref 0.00–0.07)
Basophils Absolute: 0 K/uL (ref 0.0–0.1)
Basophils Relative: 0 %
Eosinophils Absolute: 0.1 K/uL (ref 0.0–0.5)
Eosinophils Relative: 1 %
HCT: 43.5 % (ref 39.0–52.0)
Hemoglobin: 14.5 g/dL (ref 13.0–17.0)
Immature Granulocytes: 0 %
Lymphocytes Relative: 8 %
Lymphs Abs: 0.7 K/uL (ref 0.7–4.0)
MCH: 28.6 pg (ref 26.0–34.0)
MCHC: 33.3 g/dL (ref 30.0–36.0)
MCV: 85.8 fL (ref 80.0–100.0)
Monocytes Absolute: 0.6 K/uL (ref 0.1–1.0)
Monocytes Relative: 6 %
Neutro Abs: 7.5 K/uL (ref 1.7–7.7)
Neutrophils Relative %: 85 %
Platelets: 152 K/uL (ref 150–400)
RBC: 5.07 MIL/uL (ref 4.22–5.81)
RDW: 14.4 % (ref 11.5–15.5)
WBC: 8.9 K/uL (ref 4.0–10.5)
nRBC: 0 % (ref 0.0–0.2)

## 2023-10-04 LAB — COMPREHENSIVE METABOLIC PANEL WITH GFR
ALT: 16 U/L (ref 0–44)
AST: 14 U/L — ABNORMAL LOW (ref 15–41)
Albumin: 3.8 g/dL (ref 3.5–5.0)
Alkaline Phosphatase: 115 U/L (ref 38–126)
Anion gap: 8 (ref 5–15)
BUN: 10 mg/dL (ref 8–23)
CO2: 26 mmol/L (ref 22–32)
Calcium: 8.8 mg/dL — ABNORMAL LOW (ref 8.9–10.3)
Chloride: 107 mmol/L (ref 98–111)
Creatinine, Ser: 0.67 mg/dL (ref 0.61–1.24)
GFR, Estimated: >60 mL/min (ref >60)
Glucose, Bld: 126 mg/dL — ABNORMAL HIGH (ref 70–99)
Potassium: 3.8 mmol/L (ref 3.5–5.1)
Sodium: 141 mmol/L (ref 135–145)
Total Bilirubin: 0.3 mg/dL (ref 0.0–1.2)
Total Protein: 7.2 g/dL (ref 6.5–8.1)

## 2023-10-04 LAB — URINALYSIS, ROUTINE W REFLEX MICROSCOPIC
Bilirubin Urine: NEGATIVE
Glucose, UA: NEGATIVE mg/dL
Hgb urine dipstick: NEGATIVE
Ketones, ur: NEGATIVE mg/dL
Leukocytes,Ua: NEGATIVE
Nitrite: NEGATIVE
Protein, ur: NEGATIVE mg/dL
Specific Gravity, Urine: 1.003 — ABNORMAL LOW (ref 1.005–1.030)
pH: 8 (ref 5.0–8.0)

## 2023-10-04 MED ORDER — MORPHINE SULFATE (PF) 4 MG/ML IV SOLN
4.0000 mg | INTRAVENOUS | Status: DC | PRN
  Administered 2023-10-04 – 2023-10-05 (×2): 4 mg via INTRAVENOUS
  Filled 2023-10-04 (×2): qty 1

## 2023-10-04 MED ORDER — DIPHENHYDRAMINE HCL 50 MG/ML IJ SOLN
25.0000 mg | Freq: Once | INTRAMUSCULAR | Status: AC
  Administered 2023-10-04: 25 mg via INTRAVENOUS
  Filled 2023-10-04: qty 1

## 2023-10-04 MED ORDER — FENTANYL CITRATE (PF) 100 MCG/2ML IJ SOLN
50.0000 ug | Freq: Once | INTRAMUSCULAR | Status: AC
  Administered 2023-10-04: 50 ug via INTRAVENOUS
  Filled 2023-10-04: qty 2

## 2023-10-04 MED ORDER — ONDANSETRON HCL 4 MG/2ML IJ SOLN
4.0000 mg | Freq: Once | INTRAMUSCULAR | Status: AC
  Administered 2023-10-04: 4 mg via INTRAVENOUS
  Filled 2023-10-04: qty 2

## 2023-10-04 MED ORDER — SODIUM CHLORIDE 0.9 % IV BOLUS
1000.0000 mL | Freq: Once | INTRAVENOUS | Status: AC
  Administered 2023-10-04: 1000 mL via INTRAVENOUS

## 2023-10-04 MED ORDER — ONDANSETRON HCL 4 MG/2ML IJ SOLN
4.0000 mg | Freq: Four times a day (QID) | INTRAMUSCULAR | Status: DC | PRN
  Administered 2023-10-05: 4 mg via INTRAVENOUS
  Filled 2023-10-04: qty 2

## 2023-10-04 MED ORDER — MORPHINE SULFATE (PF) 4 MG/ML IV SOLN
4.0000 mg | Freq: Once | INTRAVENOUS | Status: AC
  Administered 2023-10-04: 4 mg via INTRAVENOUS
  Filled 2023-10-04: qty 1

## 2023-10-04 MED ORDER — IOHEXOL 300 MG/ML  SOLN
100.0000 mL | Freq: Once | INTRAMUSCULAR | Status: AC | PRN
  Administered 2023-10-04: 100 mL via INTRAVENOUS

## 2023-10-04 MED ORDER — LACTATED RINGERS IV BOLUS
1000.0000 mL | Freq: Once | INTRAVENOUS | Status: AC
  Administered 2023-10-04: 1000 mL via INTRAVENOUS

## 2023-10-04 MED ORDER — LACTATED RINGERS IV SOLN
INTRAVENOUS | Status: DC
  Administered 2023-10-05: 100 mL/h via INTRAVENOUS

## 2023-10-04 NOTE — ED Provider Notes (Signed)
 [place] EM[facility]GENCY DEPARTMENT AT [facility] Provider Note   CSN: [id number] Arrival date & time: 10/04/23  9066     Patient presents with: Abdominal Pain   Justin [name] is a 68 y.o. male.   Patient is a 68 year old male who presents to the Emergency Department with a chief complaint of generalized abdominal pain which has been ongoing for approximate the past 3 days.  He does have a history of chronic pancreatitis.  He notes that he has had some nausea and vomiting.  He denies any constipation or diarrhea.  He denies any dysuria or hematuria.  He notes that he is due to have surgery in 1 week at [facility] secondary to his pancreas.  He denies any fever or chills.   Abdominal Pain      Prior to Admission medications   Medication Sig Start Date End Date Taking? Authorizing Provider  albuterol  (VENTOLIN  HFA) 108 (90 Base) MCG/ACT inhaler Inhale 1-2 puffs into the lungs every 6 (six) hours as needed for wheezing or shortness of breath.    [provider]  aspirin  81 MG chewable tablet Chew 81 mg by mouth daily.     [provider]  B Complex Vitamins (VITAMIN B COMPLEX PO) Take 1 tablet by mouth daily.    [provider]  Cholecalciferol (VITAMIN D3) 1000 units CAPS Take 1,000 Units by mouth.     [provider]  clonazePAM  (KLONOPIN ) 0.5 MG tablet Take 0.5 mg by mouth at bedtime. *May take one tablet three times daily as needed for anxiety* 12/14/14   [provider]  DULoxetine  (CYMBALTA ) 60 MG capsule Take 60 mg by mouth every morning.    [provider]  gabapentin  (NEURONTIN ) 600 MG tablet Take 600 mg by mouth 3 (three) times daily.    [provider]  hydrocodone -ibuprofen (VICOPROFEN) 5-200 MG tablet Take 1 tablet by mouth every 8 (eight) hours as needed for pain.    [provider]  levothyroxine  (SYNTHROID , LEVOTHROID) 112 MCG tablet Take 112 mcg by mouth every morning.     [provider]   Multiple Vitamins-Minerals (MULTIVITAMIN ADULT PO) Take 1 tablet by mouth daily.    [provider]  nystatin (MYCOSTATIN/NYSTOP) powder Apply 1 Application topically 2 (two) times daily. 05/24/23   [provider]  nystatin ointment (MYCOSTATIN) Apply 1 Application topically 2 (two) times daily. 05/07/19   [provider]  omeprazole (PRILOSEC) 40 MG capsule Take 40 mg by mouth daily. 05/07/23   [provider]  ondansetron  (ZOFRAN  ODT) 8 MG disintegrating tablet Take 1 tablet (8 mg total) by mouth every 8 (eight) hours as needed for nausea or vomiting. 03/25/20   Ricky Fines, MD  oxybutynin  (DITROPAN -XL) 10 MG 24 hr tablet Take 10 mg by mouth daily. 04/04/23   [provider]  oxyCODONE -acetaminophen  (PERCOCET/ROXICET) 5-325 MG tablet Take 1 tablet by mouth every 6 (six) hours as needed for severe pain. 10/23/22   Ladora Congress, PA  Pancrelipase , Lip-Prot-Amyl, 6000-19000 units CPEP Take 2-4 capsules by mouth 5 (five) times daily. Patient takes 4 capsules by mouth three times a day with meals and 2 capsules by mouth twice a day with snacks    [provider]  rosuvastatin (CRESTOR) 5 MG tablet Take 5 mg by mouth daily. 09/13/22   [provider]  sennosides-docusate sodium  (SENOKOT-S) 8.6-50 MG tablet Take 1 tablet by mouth daily.    [provider]  sucralfate  (CARAFATE ) 1 g tablet Take  1 tablet (1 g total) by mouth 4 (four) times daily -  with meals and at bedtime. 06/23/23 06/22/24  Johnson, Clanford L, MD  tamsulosin  (FLOMAX ) 0.4 MG CAPS capsule Take 0.4 mg by mouth 2 (two) times daily.  12/28/14   [provider]  tiZANidine  (ZANAFLEX ) 4 MG tablet Take 1 tablet by mouth 3 (three) times daily. 03/08/22   [provider]    Allergies: Amoxicillin, Dilaudid  [hydromorphone  hcl], Hydromorphone , Methotrexate, and Morphine     Review of Systems  Gastrointestinal:  Positive for abdominal pain.  All other systems reviewed  and are negative.   Updated Vital Signs BP (!) 158/126   Pulse (!) 54   Temp 97.7 F (36.5 C) (Oral)   Resp (!) 24   Ht 5' 7 (1.702 m)   Wt 69.9 kg   SpO2 99%   BMI 24.14 kg/m   Physical Exam Vitals and nursing note reviewed.  Constitutional:      Appearance: Normal appearance.  HENT:     Head: Normocephalic and atraumatic.     Nose: Nose normal.     Mouth/Throat:     Mouth: Mucous membranes are moist.  Eyes:     Extraocular Movements: Extraocular movements intact.     Conjunctiva/sclera: Conjunctivae normal.     Pupils: Pupils are equal, round, and reactive to light.  Cardiovascular:     Rate and Rhythm: Normal rate and regular rhythm.     Pulses: Normal pulses.     Heart sounds: Normal heart sounds. No murmur heard.    No gallop.  Pulmonary:     Effort: Pulmonary effort is normal. No respiratory distress.     Breath sounds: Normal breath sounds. No stridor. No wheezing, rhonchi or rales.  Abdominal:     General: Abdomen is flat. Bowel sounds are normal. There is no distension.     Palpations: Abdomen is soft.     Tenderness: There is generalized abdominal tenderness.  Musculoskeletal:        General: Normal range of motion.     Cervical back: Normal range of motion and neck supple.  Skin:    General: Skin is warm and dry.     Findings: No rash.  Neurological:     General: No focal deficit present.     Mental Status: He is alert and oriented to person, place, and time. Mental status is at baseline.  Psychiatric:        Mood and Affect: Mood normal.        Behavior: Behavior normal.        Thought Content: Thought content normal.        Judgment: Judgment normal.     (all labs ordered are listed, but only abnormal results are displayed) Labs Reviewed  LIPASE, BLOOD  COMPREHENSIVE METABOLIC PANEL WITH GFR  URINALYSIS, ROUTINE W REFLEX MICROSCOPIC  CBC WITH DIFFERENTIAL/PLATELET    EKG: None  Radiology: No results found.   Procedures    Medications Ordered in the ED  fentaNYL  (SUBLIMAZE ) injection 50 mcg (has no administration in time range)  ondansetron  (ZOFRAN ) injection 4 mg (has no administration in time range)  sodium chloride  0.9 % bolus 1,000 mL (has no administration in time range)                                    Medical Decision Making Amount and/or Complexity of Data Reviewed Labs: ordered. Radiology:  ordered.  Risk Prescription drug management.   This patient presents to the ED for concern of abdominal pain, this involves an extensive number of treatment options, and is a complaint that carries with it a high risk of complications and morbidity.  The differential diagnosis includes acute appendicitis, cholecystitis, bowel striction, diverticulitis, testicular torsion, pyelonephritis, kidney stone, pancreatitis, mesenteric ischemia   Co morbidities that complicate the patient evaluation  Chronic pancreatitis   Additional history obtained:  Additional history obtained from medical records External records from outside source obtained and reviewed including records   Lab Tests:  I Ordered, and personally interpreted labs.  The pertinent results include: No leukocytosis, no anemia, normal kidney function liver function, normal electrolytes, unremarkable urinalysis, negative lipase   Imaging Studies ordered:  I ordered imaging studies including CT scan of the abdomen and pelvis I independently visualized and interpreted imaging which showed acute on chronic pancreatitis I agree with the radiologist interpretation   Cardiac Monitoring: / EKG:  The patient was maintained on a cardiac monitor.  I personally viewed and interpreted the cardiac monitored which showed an underlying rhythm of: Normal sinus rhythm, no ST/T wave changes, no ischemic changes, no STEMI   Consultations Obtained:  I requested consultation with the gastroenterology at [facility], Dr. Claude,  and discussed lab and imaging  findings as well as pertinent plan - they recommend: Admission to their facility to MedSurg floor   Problem List / ED Course / Critical interventions / Medication management  Patient is doing well at this time and does remain stable.  Discussed with patient CT scan findings are consistent with acute on chronic pancreatitis.  He continues to have ongoing intractable pain at this point.  Vital signs have remained stable.  He has no clinical indication for sepsis.  No other acute surgical process was noted on CT scan of the abdomen and pelvis.  Blood work has been overall unremarkable at this point.  Did initially discuss patient case with Dr. Evonnie with the hospitalist service who recommended consulting [facility] as this is where he is obtaining his care.  I did discuss patient case with Dr. Parthenia with gastroenterology at [facility] who did accept the patient for admission to a MedSurg floor.  Will continue to monitor and awaiting transport at this time. I ordered medication including fentanyl , morphine , Zofran , IV fluids for acute pancreatitis Reevaluation of the patient after these medicines showed that the patient improved I have reviewed the patients home medicines and have made adjustments as needed   Social Determinants of Health:  None   Test / Admission - Considered:  Admission     Final diagnoses:  None    ED Discharge Orders     None          Daralene Lonni JONETTA DEVONNA 10/04/23 1821    Suzette Pac, MD 10/07/23 1219

## 2023-10-04 NOTE — ED Triage Notes (Signed)
 Pt arrived via POV c/o right side abdominal pain that flared up last night. Pt reports Hx of pancreatitis. Pt denies ETOH.

## 2023-10-05 DIAGNOSIS — Z8673 Personal history of transient ischemic attack (TIA), and cerebral infarction without residual deficits: Secondary | ICD-10-CM | POA: Diagnosis not present

## 2023-10-05 DIAGNOSIS — Z87442 Personal history of urinary calculi: Secondary | ICD-10-CM | POA: Diagnosis not present

## 2023-10-05 DIAGNOSIS — E785 Hyperlipidemia, unspecified: Secondary | ICD-10-CM | POA: Diagnosis not present

## 2023-10-05 DIAGNOSIS — I10 Essential (primary) hypertension: Secondary | ICD-10-CM | POA: Diagnosis not present

## 2023-10-05 DIAGNOSIS — Z888 Allergy status to other drugs, medicaments and biological substances status: Secondary | ICD-10-CM | POA: Diagnosis not present

## 2023-10-05 DIAGNOSIS — Z7982 Long term (current) use of aspirin: Secondary | ICD-10-CM | POA: Diagnosis not present

## 2023-10-05 DIAGNOSIS — I252 Old myocardial infarction: Secondary | ICD-10-CM | POA: Diagnosis not present

## 2023-10-05 DIAGNOSIS — Z6822 Body mass index (BMI) 22.0-22.9, adult: Secondary | ICD-10-CM | POA: Diagnosis not present

## 2023-10-05 DIAGNOSIS — Z880 Allergy status to penicillin: Secondary | ICD-10-CM | POA: Diagnosis not present

## 2023-10-05 DIAGNOSIS — Z96611 Presence of right artificial shoulder joint: Secondary | ICD-10-CM | POA: Diagnosis not present

## 2023-10-05 DIAGNOSIS — E43 Unspecified severe protein-calorie malnutrition: Secondary | ICD-10-CM | POA: Diagnosis not present

## 2023-10-05 DIAGNOSIS — Z885 Allergy status to narcotic agent status: Secondary | ICD-10-CM | POA: Diagnosis not present

## 2023-10-05 DIAGNOSIS — Z79899 Other long term (current) drug therapy: Secondary | ICD-10-CM | POA: Diagnosis not present

## 2023-10-05 DIAGNOSIS — J45909 Unspecified asthma, uncomplicated: Secondary | ICD-10-CM | POA: Diagnosis not present

## 2023-10-05 DIAGNOSIS — K859 Acute pancreatitis without necrosis or infection, unspecified: Secondary | ICD-10-CM | POA: Diagnosis not present

## 2023-10-05 DIAGNOSIS — Z881 Allergy status to other antibiotic agents status: Secondary | ICD-10-CM | POA: Diagnosis not present

## 2023-10-05 DIAGNOSIS — K838 Other specified diseases of biliary tract: Secondary | ICD-10-CM | POA: Diagnosis not present

## 2023-10-05 DIAGNOSIS — Z87891 Personal history of nicotine dependence: Secondary | ICD-10-CM | POA: Diagnosis not present

## 2023-10-05 LAB — CBG MONITORING, ED: Glucose-Capillary: 91 mg/dL (ref 70–99)

## 2023-10-05 NOTE — ED Notes (Signed)
 Warren RN stated that she gave report to [facility].

## 2023-10-06 DIAGNOSIS — K859 Acute pancreatitis without necrosis or infection, unspecified: Secondary | ICD-10-CM | POA: Diagnosis not present

## 2023-10-24 DIAGNOSIS — J449 Chronic obstructive pulmonary disease, unspecified: Secondary | ICD-10-CM | POA: Diagnosis not present

## 2023-10-24 DIAGNOSIS — Z79899 Other long term (current) drug therapy: Secondary | ICD-10-CM | POA: Diagnosis not present

## 2023-10-24 DIAGNOSIS — E7800 Pure hypercholesterolemia, unspecified: Secondary | ICD-10-CM | POA: Diagnosis not present

## 2023-10-24 DIAGNOSIS — Z0000 Encounter for general adult medical examination without abnormal findings: Secondary | ICD-10-CM | POA: Diagnosis not present

## 2023-10-24 DIAGNOSIS — I10 Essential (primary) hypertension: Secondary | ICD-10-CM | POA: Diagnosis not present

## 2023-10-24 DIAGNOSIS — Z299 Encounter for prophylactic measures, unspecified: Secondary | ICD-10-CM | POA: Diagnosis not present

## 2023-10-24 DIAGNOSIS — R5383 Other fatigue: Secondary | ICD-10-CM | POA: Diagnosis not present

## 2023-10-30 DIAGNOSIS — K858 Other acute pancreatitis without necrosis or infection: Secondary | ICD-10-CM | POA: Diagnosis not present

## 2023-11-15 DIAGNOSIS — K862 Cyst of pancreas: Secondary | ICD-10-CM | POA: Diagnosis not present

## 2023-11-15 DIAGNOSIS — K863 Pseudocyst of pancreas: Secondary | ICD-10-CM | POA: Diagnosis not present

## 2023-11-15 DIAGNOSIS — Z9049 Acquired absence of other specified parts of digestive tract: Secondary | ICD-10-CM | POA: Diagnosis not present

## 2023-11-15 DIAGNOSIS — Z8673 Personal history of transient ischemic attack (TIA), and cerebral infarction without residual deficits: Secondary | ICD-10-CM | POA: Diagnosis not present

## 2023-11-15 DIAGNOSIS — E039 Hypothyroidism, unspecified: Secondary | ICD-10-CM | POA: Diagnosis not present

## 2023-11-15 DIAGNOSIS — N3289 Other specified disorders of bladder: Secondary | ICD-10-CM | POA: Diagnosis not present

## 2023-11-15 DIAGNOSIS — Z01818 Encounter for other preprocedural examination: Secondary | ICD-10-CM | POA: Diagnosis not present

## 2023-11-15 DIAGNOSIS — K859 Acute pancreatitis without necrosis or infection, unspecified: Secondary | ICD-10-CM | POA: Diagnosis not present

## 2023-11-15 DIAGNOSIS — K4020 Bilateral inguinal hernia, without obstruction or gangrene, not specified as recurrent: Secondary | ICD-10-CM | POA: Diagnosis not present

## 2023-11-15 DIAGNOSIS — K5730 Diverticulosis of large intestine without perforation or abscess without bleeding: Secondary | ICD-10-CM | POA: Diagnosis not present

## 2023-11-15 DIAGNOSIS — K858 Other acute pancreatitis without necrosis or infection: Secondary | ICD-10-CM | POA: Diagnosis not present

## 2023-11-15 DIAGNOSIS — I252 Old myocardial infarction: Secondary | ICD-10-CM | POA: Diagnosis not present

## 2023-11-29 DIAGNOSIS — J45909 Unspecified asthma, uncomplicated: Secondary | ICD-10-CM | POA: Diagnosis not present

## 2023-11-29 DIAGNOSIS — K863 Pseudocyst of pancreas: Secondary | ICD-10-CM | POA: Diagnosis not present

## 2023-11-29 DIAGNOSIS — I252 Old myocardial infarction: Secondary | ICD-10-CM | POA: Diagnosis not present

## 2023-11-29 DIAGNOSIS — Z7982 Long term (current) use of aspirin: Secondary | ICD-10-CM | POA: Diagnosis not present

## 2023-11-29 DIAGNOSIS — Z79899 Other long term (current) drug therapy: Secondary | ICD-10-CM | POA: Diagnosis not present

## 2023-11-29 DIAGNOSIS — Z8673 Personal history of transient ischemic attack (TIA), and cerebral infarction without residual deficits: Secondary | ICD-10-CM | POA: Diagnosis not present

## 2023-11-29 DIAGNOSIS — N3281 Overactive bladder: Secondary | ICD-10-CM | POA: Diagnosis not present

## 2023-11-29 DIAGNOSIS — I10 Essential (primary) hypertension: Secondary | ICD-10-CM | POA: Diagnosis not present

## 2023-11-29 DIAGNOSIS — Z87891 Personal history of nicotine dependence: Secondary | ICD-10-CM | POA: Diagnosis not present

## 2023-11-29 DIAGNOSIS — M797 Fibromyalgia: Secondary | ICD-10-CM | POA: Diagnosis not present

## 2023-11-29 DIAGNOSIS — K861 Other chronic pancreatitis: Secondary | ICD-10-CM | POA: Diagnosis not present

## 2023-11-29 DIAGNOSIS — K219 Gastro-esophageal reflux disease without esophagitis: Secondary | ICD-10-CM | POA: Diagnosis not present

## 2023-11-29 DIAGNOSIS — E039 Hypothyroidism, unspecified: Secondary | ICD-10-CM | POA: Diagnosis not present

## 2023-11-29 DIAGNOSIS — E785 Hyperlipidemia, unspecified: Secondary | ICD-10-CM | POA: Diagnosis not present

## 2024-01-01 DIAGNOSIS — K861 Other chronic pancreatitis: Secondary | ICD-10-CM | POA: Diagnosis not present

## 2024-01-01 DIAGNOSIS — K858 Other acute pancreatitis without necrosis or infection: Secondary | ICD-10-CM | POA: Diagnosis not present
